# Patient Record
Sex: Male | Born: 1955
Health system: Southern US, Community
[De-identification: ages and names within clinical notes are randomized; demographics above are authoritative.]

## PROBLEM LIST (undated history)

## (undated) DIAGNOSIS — K219 Gastro-esophageal reflux disease without esophagitis: Secondary | ICD-10-CM

## (undated) DIAGNOSIS — B1920 Unspecified viral hepatitis C without hepatic coma: Secondary | ICD-10-CM

## (undated) DIAGNOSIS — F419 Anxiety disorder, unspecified: Secondary | ICD-10-CM

## (undated) DIAGNOSIS — Z87442 Personal history of urinary calculi: Secondary | ICD-10-CM

## (undated) DIAGNOSIS — N200 Calculus of kidney: Secondary | ICD-10-CM

## (undated) DIAGNOSIS — Z973 Presence of spectacles and contact lenses: Secondary | ICD-10-CM

## (undated) DIAGNOSIS — D569 Thalassemia, unspecified: Secondary | ICD-10-CM

## (undated) DIAGNOSIS — R51 Headache: Secondary | ICD-10-CM

## (undated) DIAGNOSIS — M4316 Spondylolisthesis, lumbar region: Secondary | ICD-10-CM

## (undated) DIAGNOSIS — M47812 Spondylosis without myelopathy or radiculopathy, cervical region: Secondary | ICD-10-CM

## (undated) DIAGNOSIS — B001 Herpesviral vesicular dermatitis: Secondary | ICD-10-CM

## (undated) DIAGNOSIS — I10 Essential (primary) hypertension: Secondary | ICD-10-CM

## (undated) DIAGNOSIS — F329 Major depressive disorder, single episode, unspecified: Secondary | ICD-10-CM

## (undated) DIAGNOSIS — F32A Depression, unspecified: Secondary | ICD-10-CM

## (undated) DIAGNOSIS — J189 Pneumonia, unspecified organism: Secondary | ICD-10-CM

## (undated) HISTORY — PX: COLONOSCOPY: SHX174

## (undated) HISTORY — DX: Calculus of kidney: N20.0

## (undated) HISTORY — DX: Essential (primary) hypertension: I10

## (undated) HISTORY — DX: Major depressive disorder, single episode, unspecified: F32.9

## (undated) HISTORY — DX: Unspecified viral hepatitis C without hepatic coma: B19.20

## (undated) HISTORY — DX: Thalassemia, unspecified: D56.9

## (undated) HISTORY — DX: Spondylosis without myelopathy or radiculopathy, cervical region: M47.812

## (undated) HISTORY — PX: TONSILLECTOMY: SUR1361

## (undated) HISTORY — DX: Headache: R51

## (undated) HISTORY — DX: Depression, unspecified: F32.A

## (undated) HISTORY — DX: Herpesviral vesicular dermatitis: B00.1

---

## 1998-03-14 ENCOUNTER — Encounter: Admission: RE | Admit: 1998-03-14 | Discharge: 1998-03-14 | Payer: Self-pay | Admitting: Internal Medicine

## 1998-03-29 ENCOUNTER — Encounter: Admission: RE | Admit: 1998-03-29 | Discharge: 1998-03-29 | Payer: Self-pay | Admitting: Internal Medicine

## 1998-03-30 ENCOUNTER — Encounter: Admission: RE | Admit: 1998-03-30 | Discharge: 1998-03-30 | Payer: Self-pay | Admitting: Internal Medicine

## 1998-04-04 ENCOUNTER — Encounter: Admission: RE | Admit: 1998-04-04 | Discharge: 1998-04-04 | Payer: Self-pay | Admitting: Internal Medicine

## 1998-04-16 DIAGNOSIS — N200 Calculus of kidney: Secondary | ICD-10-CM

## 1998-04-16 HISTORY — DX: Calculus of kidney: N20.0

## 1998-06-06 ENCOUNTER — Encounter: Admission: RE | Admit: 1998-06-06 | Discharge: 1998-06-06 | Payer: Self-pay | Admitting: Internal Medicine

## 1998-10-03 ENCOUNTER — Encounter: Admission: RE | Admit: 1998-10-03 | Discharge: 1998-10-03 | Payer: Self-pay | Admitting: Internal Medicine

## 1998-11-10 ENCOUNTER — Encounter: Payer: Self-pay | Admitting: Urology

## 1998-11-10 ENCOUNTER — Ambulatory Visit (HOSPITAL_COMMUNITY): Admission: RE | Admit: 1998-11-10 | Discharge: 1998-11-10 | Payer: Self-pay | Admitting: Urology

## 1999-02-01 ENCOUNTER — Encounter: Admission: RE | Admit: 1999-02-01 | Discharge: 1999-02-01 | Payer: Self-pay | Admitting: Internal Medicine

## 1999-05-03 ENCOUNTER — Encounter: Admission: RE | Admit: 1999-05-03 | Discharge: 1999-05-03 | Payer: Self-pay | Admitting: Internal Medicine

## 1999-07-13 ENCOUNTER — Ambulatory Visit (HOSPITAL_COMMUNITY): Admission: RE | Admit: 1999-07-13 | Discharge: 1999-07-13 | Payer: Self-pay

## 1999-07-13 ENCOUNTER — Encounter: Admission: RE | Admit: 1999-07-13 | Discharge: 1999-07-13 | Payer: Self-pay | Admitting: Internal Medicine

## 1999-07-19 ENCOUNTER — Encounter: Admission: RE | Admit: 1999-07-19 | Discharge: 1999-07-19 | Payer: Self-pay | Admitting: Internal Medicine

## 1999-09-20 ENCOUNTER — Encounter: Admission: RE | Admit: 1999-09-20 | Discharge: 1999-09-20 | Payer: Self-pay | Admitting: Internal Medicine

## 2000-01-16 ENCOUNTER — Encounter: Admission: RE | Admit: 2000-01-16 | Discharge: 2000-01-16 | Payer: Self-pay | Admitting: Internal Medicine

## 2000-05-15 ENCOUNTER — Encounter: Admission: RE | Admit: 2000-05-15 | Discharge: 2000-05-15 | Payer: Self-pay | Admitting: Internal Medicine

## 2000-11-06 ENCOUNTER — Encounter: Admission: RE | Admit: 2000-11-06 | Discharge: 2000-11-06 | Payer: Self-pay | Admitting: Internal Medicine

## 2001-06-25 ENCOUNTER — Encounter: Admission: RE | Admit: 2001-06-25 | Discharge: 2001-06-25 | Payer: Self-pay | Admitting: Internal Medicine

## 2002-01-27 ENCOUNTER — Encounter: Admission: RE | Admit: 2002-01-27 | Discharge: 2002-01-27 | Payer: Self-pay | Admitting: Internal Medicine

## 2002-01-29 ENCOUNTER — Ambulatory Visit (HOSPITAL_COMMUNITY): Admission: RE | Admit: 2002-01-29 | Discharge: 2002-01-29 | Payer: Self-pay | Admitting: *Deleted

## 2002-01-29 ENCOUNTER — Encounter: Payer: Self-pay | Admitting: *Deleted

## 2002-02-02 ENCOUNTER — Encounter: Admission: RE | Admit: 2002-02-02 | Discharge: 2002-02-02 | Payer: Self-pay | Admitting: Internal Medicine

## 2002-02-10 ENCOUNTER — Encounter: Admission: RE | Admit: 2002-02-10 | Discharge: 2002-02-10 | Payer: Self-pay | Admitting: Internal Medicine

## 2002-04-16 DIAGNOSIS — M47812 Spondylosis without myelopathy or radiculopathy, cervical region: Secondary | ICD-10-CM

## 2002-04-16 HISTORY — PX: CERVICAL DISCECTOMY: SHX98

## 2002-04-16 HISTORY — DX: Spondylosis without myelopathy or radiculopathy, cervical region: M47.812

## 2002-04-16 HISTORY — PX: LITHOTRIPSY: SUR834

## 2002-05-28 ENCOUNTER — Encounter: Payer: Self-pay | Admitting: Neurological Surgery

## 2002-06-01 ENCOUNTER — Encounter: Payer: Self-pay | Admitting: Neurological Surgery

## 2002-06-01 ENCOUNTER — Inpatient Hospital Stay (HOSPITAL_COMMUNITY): Admission: RE | Admit: 2002-06-01 | Discharge: 2002-06-02 | Payer: Self-pay | Admitting: Neurological Surgery

## 2002-08-03 ENCOUNTER — Encounter: Admission: RE | Admit: 2002-08-03 | Discharge: 2002-11-01 | Payer: Self-pay | Admitting: Neurological Surgery

## 2002-08-14 ENCOUNTER — Encounter: Admission: RE | Admit: 2002-08-14 | Discharge: 2002-08-14 | Payer: Self-pay | Admitting: Internal Medicine

## 2002-12-09 ENCOUNTER — Encounter: Admission: RE | Admit: 2002-12-09 | Discharge: 2002-12-09 | Payer: Self-pay | Admitting: Internal Medicine

## 2003-03-31 ENCOUNTER — Encounter: Admission: RE | Admit: 2003-03-31 | Discharge: 2003-03-31 | Payer: Self-pay | Admitting: Internal Medicine

## 2003-04-28 ENCOUNTER — Encounter: Admission: RE | Admit: 2003-04-28 | Discharge: 2003-04-28 | Payer: Self-pay | Admitting: Internal Medicine

## 2003-05-21 ENCOUNTER — Ambulatory Visit (HOSPITAL_COMMUNITY): Admission: RE | Admit: 2003-05-21 | Discharge: 2003-05-21 | Payer: Self-pay | Admitting: Gastroenterology

## 2003-05-21 LAB — HM COLONOSCOPY: HM Colonoscopy: NORMAL

## 2004-02-14 ENCOUNTER — Ambulatory Visit: Payer: Self-pay | Admitting: Internal Medicine

## 2004-09-19 ENCOUNTER — Ambulatory Visit: Payer: Self-pay | Admitting: Internal Medicine

## 2004-09-27 ENCOUNTER — Ambulatory Visit: Payer: Self-pay | Admitting: Internal Medicine

## 2004-10-03 ENCOUNTER — Ambulatory Visit (HOSPITAL_COMMUNITY): Admission: RE | Admit: 2004-10-03 | Discharge: 2004-10-03 | Payer: Self-pay | Admitting: Internal Medicine

## 2004-12-15 ENCOUNTER — Ambulatory Visit (HOSPITAL_COMMUNITY): Admission: RE | Admit: 2004-12-15 | Discharge: 2004-12-15 | Payer: Self-pay | Admitting: Gastroenterology

## 2004-12-15 ENCOUNTER — Encounter (INDEPENDENT_AMBULATORY_CARE_PROVIDER_SITE_OTHER): Payer: Self-pay | Admitting: Specialist

## 2004-12-25 ENCOUNTER — Ambulatory Visit: Payer: Self-pay | Admitting: Internal Medicine

## 2005-03-13 ENCOUNTER — Ambulatory Visit: Payer: Self-pay | Admitting: Internal Medicine

## 2005-04-16 HISTORY — PX: NASAL SEPTUM SURGERY: SHX37

## 2005-10-29 ENCOUNTER — Ambulatory Visit: Payer: Self-pay | Admitting: Internal Medicine

## 2005-12-24 ENCOUNTER — Ambulatory Visit: Payer: Self-pay | Admitting: Internal Medicine

## 2006-01-29 ENCOUNTER — Ambulatory Visit: Payer: Self-pay | Admitting: Internal Medicine

## 2006-02-22 DIAGNOSIS — N200 Calculus of kidney: Secondary | ICD-10-CM | POA: Insufficient documentation

## 2006-02-22 DIAGNOSIS — B182 Chronic viral hepatitis C: Secondary | ICD-10-CM | POA: Insufficient documentation

## 2006-02-22 DIAGNOSIS — I10 Essential (primary) hypertension: Secondary | ICD-10-CM | POA: Insufficient documentation

## 2006-04-04 DIAGNOSIS — B009 Herpesviral infection, unspecified: Secondary | ICD-10-CM | POA: Insufficient documentation

## 2006-04-04 HISTORY — DX: Herpesviral infection, unspecified: B00.9

## 2006-06-26 ENCOUNTER — Telehealth: Payer: Self-pay | Admitting: *Deleted

## 2006-09-25 ENCOUNTER — Ambulatory Visit: Payer: Self-pay | Admitting: Internal Medicine

## 2006-09-25 DIAGNOSIS — F3289 Other specified depressive episodes: Secondary | ICD-10-CM | POA: Insufficient documentation

## 2006-09-25 DIAGNOSIS — F329 Major depressive disorder, single episode, unspecified: Secondary | ICD-10-CM | POA: Insufficient documentation

## 2006-09-25 LAB — CONVERTED CEMR LAB
BUN: 13 mg/dL (ref 6–23)
CO2: 29 meq/L (ref 19–32)
Calcium: 9.6 mg/dL (ref 8.4–10.5)
Chloride: 100 meq/L (ref 96–112)
Cholesterol: 150 mg/dL (ref 0–200)
Creatinine, Ser: 1.13 mg/dL (ref 0.40–1.50)
Glucose, Bld: 95 mg/dL (ref 70–99)
HDL: 46 mg/dL (ref >39)
LDL Cholesterol: 84 mg/dL (ref 0–99)
Potassium: 4.4 meq/L (ref 3.5–5.3)
Sodium: 141 meq/L (ref 135–145)
Total CHOL/HDL Ratio: 3.3
Triglycerides: 99 mg/dL (ref <150)
VLDL: 20 mg/dL (ref 0–40)

## 2006-11-14 ENCOUNTER — Telehealth (INDEPENDENT_AMBULATORY_CARE_PROVIDER_SITE_OTHER): Payer: Self-pay | Admitting: *Deleted

## 2006-11-19 ENCOUNTER — Telehealth (INDEPENDENT_AMBULATORY_CARE_PROVIDER_SITE_OTHER): Payer: Self-pay | Admitting: *Deleted

## 2006-12-31 ENCOUNTER — Encounter: Payer: Self-pay | Admitting: Internal Medicine

## 2007-01-08 ENCOUNTER — Telehealth: Payer: Self-pay | Admitting: Internal Medicine

## 2007-03-25 ENCOUNTER — Telehealth: Payer: Self-pay | Admitting: Internal Medicine

## 2007-09-19 ENCOUNTER — Telehealth: Payer: Self-pay | Admitting: Internal Medicine

## 2007-10-27 ENCOUNTER — Telehealth: Payer: Self-pay | Admitting: Internal Medicine

## 2007-12-24 ENCOUNTER — Telehealth: Payer: Self-pay | Admitting: Internal Medicine

## 2008-01-09 ENCOUNTER — Ambulatory Visit: Payer: Self-pay | Admitting: Internal Medicine

## 2008-01-09 LAB — CONVERTED CEMR LAB
ALT: 46 units/L (ref 0–53)
AST: 36 units/L (ref 0–37)
Albumin: 4.4 g/dL (ref 3.5–5.2)
Alkaline Phosphatase: 60 units/L (ref 39–117)
BUN: 15 mg/dL (ref 6–23)
Basophils Absolute: 0.1 10*3/uL (ref 0.0–0.1)
Basophils Relative: 1 % (ref 0–1)
CO2: 30 meq/L (ref 19–32)
Calcium: 9.5 mg/dL (ref 8.4–10.5)
Chloride: 98 meq/L (ref 96–112)
Creatinine, Ser: 1.13 mg/dL (ref 0.40–1.50)
Eosinophils Absolute: 0.4 10*3/uL (ref 0.0–0.7)
Eosinophils Relative: 5 % (ref 0–5)
Glucose, Bld: 101 mg/dL — ABNORMAL HIGH (ref 70–99)
HCT: 43.5 % (ref 39.0–52.0)
Hemoglobin: 14.4 g/dL (ref 13.0–17.0)
Lymphocytes Relative: 44 % (ref 12–46)
Lymphs Abs: 3.3 10*3/uL (ref 0.7–4.0)
MCHC: 33.1 g/dL (ref 30.0–36.0)
MCV: 72.6 fL — ABNORMAL LOW (ref 78.0–100.0)
Monocytes Absolute: 0.7 10*3/uL (ref 0.1–1.0)
Monocytes Relative: 9 % (ref 3–12)
Neutro Abs: 3.1 10*3/uL (ref 1.7–7.7)
Neutrophils Relative %: 41 % — ABNORMAL LOW (ref 43–77)
Platelets: 191 10*3/uL (ref 150–400)
Potassium: 3.8 meq/L (ref 3.5–5.3)
RBC: 5.99 M/uL — ABNORMAL HIGH (ref 4.22–5.81)
RDW: 15.3 % (ref 11.5–15.5)
Sodium: 139 meq/L (ref 135–145)
Total Bilirubin: 0.5 mg/dL (ref 0.3–1.2)
Total Protein: 7.5 g/dL (ref 6.0–8.3)
WBC: 7.5 10*3/uL (ref 4.0–10.5)

## 2008-03-16 ENCOUNTER — Telehealth: Payer: Self-pay | Admitting: Internal Medicine

## 2008-07-13 ENCOUNTER — Telehealth: Payer: Self-pay | Admitting: Internal Medicine

## 2008-08-04 ENCOUNTER — Telehealth: Payer: Self-pay | Admitting: Internal Medicine

## 2008-12-01 ENCOUNTER — Telehealth: Payer: Self-pay | Admitting: Internal Medicine

## 2009-02-18 ENCOUNTER — Ambulatory Visit: Payer: Self-pay | Admitting: Internal Medicine

## 2009-02-18 LAB — CONVERTED CEMR LAB
ALT: 43 units/L (ref 0–53)
AST: 35 units/L (ref 0–37)
Albumin: 4.3 g/dL (ref 3.5–5.2)
Alkaline Phosphatase: 77 units/L (ref 39–117)
BUN: 15 mg/dL (ref 6–23)
CO2: 27 meq/L (ref 19–32)
Calcium: 9.6 mg/dL (ref 8.4–10.5)
Chloride: 102 meq/L (ref 96–112)
Cholesterol: 188 mg/dL (ref 0–200)
Creatinine, Ser: 1.11 mg/dL (ref 0.40–1.50)
Glucose, Bld: 121 mg/dL — ABNORMAL HIGH (ref 70–99)
HDL: 40 mg/dL (ref >39)
LDL Cholesterol: 124 mg/dL — ABNORMAL HIGH (ref 0–99)
Potassium: 3.6 meq/L (ref 3.5–5.3)
Sodium: 142 meq/L (ref 135–145)
Total Bilirubin: 0.5 mg/dL (ref 0.3–1.2)
Total CHOL/HDL Ratio: 4.7
Total Protein: 7.2 g/dL (ref 6.0–8.3)
Triglycerides: 119 mg/dL (ref <150)
VLDL: 24 mg/dL (ref 0–40)

## 2009-02-24 ENCOUNTER — Telehealth: Payer: Self-pay | Admitting: Internal Medicine

## 2009-03-17 ENCOUNTER — Telehealth: Payer: Self-pay | Admitting: Internal Medicine

## 2009-03-31 ENCOUNTER — Telehealth: Payer: Self-pay | Admitting: Internal Medicine

## 2009-04-06 ENCOUNTER — Telehealth: Payer: Self-pay | Admitting: Internal Medicine

## 2009-05-06 ENCOUNTER — Ambulatory Visit: Payer: Self-pay | Admitting: Internal Medicine

## 2009-05-06 DIAGNOSIS — R519 Headache, unspecified: Secondary | ICD-10-CM | POA: Insufficient documentation

## 2009-05-06 DIAGNOSIS — R51 Headache: Secondary | ICD-10-CM | POA: Insufficient documentation

## 2009-09-01 ENCOUNTER — Telehealth: Payer: Self-pay | Admitting: Internal Medicine

## 2009-09-05 ENCOUNTER — Telehealth: Payer: Self-pay | Admitting: Internal Medicine

## 2009-09-20 ENCOUNTER — Ambulatory Visit: Payer: Self-pay | Admitting: Internal Medicine

## 2009-09-20 DIAGNOSIS — M79609 Pain in unspecified limb: Secondary | ICD-10-CM | POA: Insufficient documentation

## 2009-09-20 LAB — CONVERTED CEMR LAB
BUN: 14 mg/dL (ref 6–23)
CO2: 30 meq/L (ref 19–32)
Calcium: 9.6 mg/dL (ref 8.4–10.5)
Chloride: 100 meq/L (ref 96–112)
Creatinine, Ser: 1.19 mg/dL (ref 0.40–1.50)
Glucose, Bld: 103 mg/dL — ABNORMAL HIGH (ref 70–99)
Potassium: 3.8 meq/L (ref 3.5–5.3)
Sodium: 139 meq/L (ref 135–145)

## 2009-11-03 ENCOUNTER — Ambulatory Visit: Payer: Self-pay | Admitting: Internal Medicine

## 2009-11-03 ENCOUNTER — Telehealth: Payer: Self-pay | Admitting: Internal Medicine

## 2009-11-03 DIAGNOSIS — M542 Cervicalgia: Secondary | ICD-10-CM | POA: Insufficient documentation

## 2009-12-07 ENCOUNTER — Ambulatory Visit: Payer: Self-pay | Admitting: Internal Medicine

## 2010-03-27 ENCOUNTER — Telehealth: Payer: Self-pay | Admitting: Internal Medicine

## 2010-05-16 NOTE — Assessment & Plan Note (Signed)
Summary: est-ck/fu/med/flu shot/cfb     Vital Signs:  Patient profile:   55 year old male Height:      69 inches (175.26 cm) Weight:      217.6 pounds (98.91 kg) BMI:     32.25 Temp:     98.6 degrees F oral Pulse rate:   99 / minute BP sitting:   142 / 92  (right arm) Cuff size:   large  Vitals Entered By: Krystal Eaton Duncan Dull) (February 18, 2009 10:51 AM) CC: routine f/u, flu vaccine, med refill Is Patient Diabetic? No Pain Assessment Patient in pain? no      Nutritional Status BMI of > 30 = obese  Have you ever been in a relationship where you felt threatened, hurt or afraid?No   Does patient need assistance? Functional Status Self care Ambulation Normal   CC:  routine f/u, flu vaccine, and med refill.  History of Present Illness: 55 man with mild HTN.  Routine f/u No complaints.  Preventive Screening-Counseling & Management  Alcohol-Tobacco     Smoking Status: quit     Year Quit: 1980's  Allergies: No Known Drug Allergies   Impression & Recommendations:  Problem # 1:  DISORDER, DEPRESSIVE NEC (ICD-311) sees psychiatrist.  doing well.   His updated medication list for this problem includes:    Diazepam 5 Mg Tabs (Diazepam) .Marland Kitchen... Take 1 tablet by mouth two times a day  Problem # 2:  HYPERTENSION (ICD-401.9)  BP good today despite rushing and wgt up. no CV symptoms. Cor reg w/o murmur.  Lungs clear.   His updated medication list for this problem includes:    Hydrochlorothiazide 25 Mg Tabs (Hydrochlorothiazide) ..... Once daily    Atenolol 50 Mg Tabs (Atenolol) ..... Once daily    Amlodipine Besylate 10 Mg Tabs (Amlodipine besylate) .Marland Kitchen... Take 1 tablet by mouth once a day    Lotensin 40 Mg Tabs (Benazepril hcl) .Marland Kitchen... 2 tabs once daily  Orders: T-Lipid Profile ([id number]) T-Comprehensive Metabolic Panel ([id number])  Problem # 3:  HEPATITIS C (ICD-070.51) sees dr. Randa Evens yearly.  Problem # 4:  CALCULUS, KIDNEY (ICD-592.0) sees dr.  Isabel Caprice, urologist.  Complete Medication List: 1)  Hydrochlorothiazide 25 Mg Tabs (Hydrochlorothiazide) .... Once daily 2)  Darvocet-n 100 Tabs (Propoxyphene n-apap tabs) .... Take three times a day as needed for pain 3)  Flomax 0.4 Mg Cp24 (Tamsulosin hcl) .... At bedtime (dr. Isabel Caprice) 4)  Atenolol 50 Mg Tabs (Atenolol) .... Once daily 5)  Amlodipine Besylate 10 Mg Tabs (Amlodipine besylate) .... Take 1 tablet by mouth once a day 6)  Diazepam 5 Mg Tabs (Diazepam) .... Take 1 tablet by mouth two times a day 7)  Lotensin 40 Mg Tabs (Benazepril hcl) .... 2 tabs once daily 8)  Symbyax 6-50 Mg Caps (Olanzapine-fluoxetine hcl) .... Take 1 at bedtime  Patient Instructions: 1)  Please schedule a follow-up appointment in 6 months. Process Orders Check Orders Results:     [facility]: ABN not required for this insurance Tests Sent for requisitioning (February 18, 2009 12:36 PM):     02/18/2009: [facility] -- T-Lipid Profile [phone] (signed)     02/18/2009: [facility] -- T-Comprehensive Metabolic Panel [id number] (signed)

## 2010-05-16 NOTE — Progress Notes (Signed)
Summary: refill/gg  Phone Note Refill Request  on Sep 01, 2009 4:37 PM  Refills Requested: Medication #1:  DIAZEPAM 5 MG TABS Take 1 tablet by mouth two times a day   Last Refilled: 08/01/2009  Medication #2:  HYDROCODONE-ACETAMINOPHEN 5-500 MG TABS May take up to 3 a day for pain.   Last Refilled: 08/01/2009  Method Requested: Fax to Local Pharmacy Initial call taken by: Merrie Roof RN,  Sep 01, 2009 4:37 PM  Follow-up for Phone Call        Refill approved-nurse to complete Follow-up by: Ulyess Mort MD,  Sep 02, 2009 4:48 PM  Additional Follow-up for Phone Call Additional follow up Details #1::        Rx faxed to pharmacy Additional Follow-up by: Merrie Roof RN,  Sep 05, 2009 1:44 PM    Prescriptions: HYDROCODONE-ACETAMINOPHEN 5-500 MG TABS (HYDROCODONE-ACETAMINOPHEN) May take up to 3 a day for pain  #90 x 3   Entered and Authorized by:   Ulyess Mort MD   Signed by:   Ulyess Mort MD on 09/02/2009   Method used:   Telephoned to ...       [facility] [address] [phone]* (retail)       [address]       [place], [place]  [address]       Ph: [phone] or [phone]       Fax: [phone]   RxID:   [id number] DIAZEPAM 5 MG TABS (DIAZEPAM) Take 1 tablet by mouth two times a day  #62 x 6   Entered and Authorized by:   Ulyess Mort MD   Signed by:   Ulyess Mort MD on 09/02/2009   Method used:   Telephoned to ...       [facility] [address] [phone]* (retail)       [address]       [place], [place]  [address]       Ph: [phone] or [phone]       Fax: [phone]   RxID:   [id number]

## 2010-05-16 NOTE — Assessment & Plan Note (Signed)
Summary: est-ck/fu/meds/cfb   Vital Signs:  Patient profile:   55 year old male Height:      69 inches (175.26 cm) Weight:      209.7 pounds (95.32 kg) BMI:     31.08 Temp:     97.2 degrees F (36.22 degrees C) oral Pulse rate:   78 / minute BP sitting:   125 / 89  (right arm)  Vitals Entered By: Cynda Familia Duncan Dull) (September 20, 2009 2:34 PM) CC: pt c/o pain bottom of left foot/heel, Depression Is Patient Diabetic? No Pain Assessment Patient in pain? yes     Location: left foot Intensity: 8 Type: sharp Onset of pain  Constant 1.5 mths Nutritional Status BMI of 25 - 29 = overweight  Have you ever been in a relationship where you felt threatened, hurt or afraid?No   Does patient need assistance? Functional Status Self care Ambulation Normal   CC:  pt c/o pain bottom of left foot/heel and Depression.  History of Present Illness: 55 man with HTN.   Today he c/o foot pain that he blames on walking on hard floor at work.  Has started seeing a chiropractor about this and is to have frequent visits.  Asked about increased vicodin.  Depression History:      The patient denies a depressed mood most of the day and a diminished interest in his usual daily activities.        Comments:  "gets depressed mood about every 3 days".   Preventive Screening-Counseling & Management  Alcohol-Tobacco     Smoking Status: quit     Year Quit: 1980's  Allergies: No Known Drug Allergies  Physical Exam  Lungs:  normal breath sounds, no crackles, and no wheezes.   Heart:  regular rhythm, no murmur, and no HJR.   Extremities:  May have flat feet.  No skin lesions.  Pulses nl.   Impression & Recommendations:  Problem # 1:  FOOT PAIN, BILATERAL (ICD-729.5) Do not know podiatric name for his problem.  Do not see indication for opiate therapy.  He is already on vicodin 5 three times a day for headaches -- This was a substitute when darvocet pulled from use. Since he is already seeing a  chiropractor and has purchased new shoes, will advise wait and see.  He is to consider visit to podiatrist if problem persists.  Complete Medication List: 1)  Hydrochlorothiazide 25 Mg Tabs (Hydrochlorothiazide) .... Once daily 2)  Flomax 0.4 Mg Cp24 (Tamsulosin hcl) .... At bedtime (dr. Isabel Caprice) 3)  Atenolol 50 Mg Tabs (Atenolol) .... Once daily 4)  Amlodipine Besylate 10 Mg Tabs (Amlodipine besylate) .... Take 1 tablet by mouth once a day 5)  Diazepam 5 Mg Tabs (Diazepam) .... Take 1 tablet by mouth two times a day 6)  Lotensin 40 Mg Tabs (Benazepril hcl) .... 2 tabs once daily 7)  Symbyax 6-50 Mg Caps (Olanzapine-fluoxetine hcl) .... Take 1 at bedtime 8)  Tramadol-acetaminophen 37.5-325 Mg Tabs (Tramadol-acetaminophen) .... Take one every 6 hours as needed for pain 9)  Hydrocodone-acetaminophen 5-500 Mg Tabs (Hydrocodone-acetaminophen) .... May take up to 3 a day for pain  Other Orders: T-Basic Metabolic Panel [phone])  Patient Instructions: 1)  Please schedule a follow-up appointment in 4 months. Process Orders Check Orders Results:     [facility]: ABN not required for this insurance Tests Sent for requisitioning (September 20, 2009 4:58 PM):     09/20/2009: [facility] -- T-Basic Metabolic Panel [phone] (  signed)    Prevention & Chronic Care Immunizations   Influenza vaccine: Fluvax 3+  (01/09/2008)    Tetanus booster: Not documented    Pneumococcal vaccine: Not documented  Colorectal Screening   Hemoccult: Not documented    Colonoscopy: Not documented  Other Screening   PSA: Not documented   Smoking status: quit  (09/20/2009)  Lipids   Total Cholesterol: 188  (02/18/2009)   LDL: 124  (02/18/2009)   LDL Direct: Not documented   HDL: 40  (02/18/2009)   Triglycerides: 119  (02/18/2009)  Hypertension   Last Blood Pressure: 125 / 89  (09/20/2009)   Serum creatinine: 1.11  (02/18/2009)   Serum potassium 3.6   (02/18/2009)  Self-Management Support :    Patient will work on the following items until the next clinic visit to reach self-care goals:     Medications and monitoring: take my medicines every day  (09/20/2009)     Eating: eat foods that are low in salt, eat baked foods instead of fried foods  (09/20/2009)    Hypertension self-management support: Pre-printed educational material, Resources for patients handout, Written self-care plan  (09/20/2009)   Hypertension self-care plan printed.      Resource handout printed.

## 2010-05-16 NOTE — Progress Notes (Signed)
Summary: Refill/gh  Phone Note Refill Request Message from:  Pharmacy on March 16, 2008 3:04 PM  Refills Requested: Medication #1:  DARVOCET-N 100  TABS Take three times a day as needed for pain   Last Refilled: 03/02/2008  Method Requested: Electronic Initial call taken by: Angelina Ok RN,  March 16, 2008 3:04 PM  Follow-up for Phone Call        Refill approved-nurse to complete Follow-up by: Ulyess Mort MD,  March 16, 2008 5:07 PM  Additional Follow-up for Phone Call Additional follow up Details #1::        Rx called to pharmacy Additional Follow-up by: Angelina Ok RN,  March 17, 2008 9:04 AM      Prescriptions: DARVOCET-N 100  TABS (PROPOXYPHENE N-APAP TABS) Take three times a day as needed for pain  #90 x 4   Entered and Authorized by:   Ulyess Mort MD   Signed by:   Ulyess Mort MD on 03/16/2008   Method used:   Telephoned to ...       [facility] [address] [phone]* (retail)       [address]       [place], [place]  [address]       Ph: [phone] or [phone]       Fax: [phone]   RxID:   [id number]

## 2010-05-16 NOTE — Progress Notes (Signed)
Summary: Refill/gh  Phone Note Refill Request Message from:  Fax from Pharmacy on August 04, 2008 8:54 AM  Refills Requested: Medication #1:  DIAZEPAM 5 MG TABS Take 1 tablet by mouth two times a day   Last Refilled: 05/26/2008  Method Requested: Fax to Local Pharmacy Initial call taken by: Angelina Ok RN,  August 04, 2008 11:01 AM      Prescriptions: DIAZEPAM 5 MG TABS (DIAZEPAM) Take 1 tablet by mouth two times a day  #62 x 6   Entered and Authorized by:   Ulyess Mort MD   Signed by:   Ulyess Mort MD on 08/04/2008   Method used:   Telephoned to ...       [facility] [address] [phone]* (retail)       [address]       [place], [place]  [address]       Ph: [phone] or [phone]       Fax: [phone]   RxID:   [id number]
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
## 2010-05-16 NOTE — Progress Notes (Signed)
Summary: Rx refill/dms  Phone Note Refill Request Message from:  Fax from Pharmacy on November 14, 2006 8:43 AM  Refills Requested: Medication #1:  DIAZEPAM 5 MG TABS Take 1 tablet by mouth two times a day   Supply Requested: 40   Last Refilled: 06/23/2006   Notes: bid prn  Method Requested: Electronic Initial call taken by: Henderson Cloud,  November 14, 2006 8:44 AM  Follow-up for Phone Call        Refill approved-nurse to complete Follow-up by: Ulyess Mort MD,  November 14, 2006 4:20 PM  Additional Follow-up for Phone Call Additional follow up Details #1::        Rx faxed to pharmacy Additional Follow-up by: Henderson Cloud,  November 15, 2006 12:29 PM      Prescriptions: DIAZEPAM 5 MG TABS (DIAZEPAM) Take 1 tablet by mouth two times a day  #62 x 4   Entered and Authorized by:   Ulyess Mort MD   Signed by:   Ulyess Mort MD on 11/14/2006   Method used:   Electronically sent to ...       [facility]       [address]       [place], [place]  [address]       Ph: [phone]       Fax: [phone]   RxID:   [id number]

## 2010-05-16 NOTE — Procedures (Signed)
[facility] : Dr. Randa Evens   Imported By: Florinda Marker 01/23/2007 11:03:04  _____________________________________________________________________  External Attachment:    Type:   Image     Comment:   External Document

## 2010-05-16 NOTE — Progress Notes (Signed)
Summary: refillgg  Phone Note Refill Request  on January 08, 2007 2:27 PM  Refills Requested: Medication #1:  DARVOCET-N 100  TABS   Last Refilled: 12/06/2006 Initial call taken by: Merrie Roof RN,  January 08, 2007 2:29 PM  Follow-up for Phone Call        Refill approved-nurse to complete Follow-up by: Ulyess Mort MD,  January 08, 2007 2:31 PM  Additional Follow-up for Phone Call Additional follow up Details #1::        Rx faxed to pharmacy Additional Follow-up by: Merrie Roof RN,  January 08, 2007 3:43 PM    New/Updated Medications: DARVOCET-N 100  TABS (PROPOXYPHENE N-APAP TABS) Take three times a day as needed for pain   Prescriptions: DARVOCET-N 100  TABS (PROPOXYPHENE N-APAP TABS) Take three times a day as needed for pain  #90 x 2   Entered and Authorized by:   Ulyess Mort MD   Signed by:   Ulyess Mort MD on 01/08/2007   Method used:   Telephoned to ...       [facility] [phone] [facility] [address]       [address]       [place], [place]  [address]       Ph: [phone] or [phone]       Fax: [phone]   RxID:   [id number]

## 2010-05-16 NOTE — Progress Notes (Signed)
Summary: refill/gg  Phone Note Refill Request  on March 25, 2007 3:01 PM  Refills Requested: Medication #1:  DARVOCET-N 100  TABS Take three times a day as needed for pain   Last Refilled: 02/26/2007 Initial call taken by: Merrie Roof RN,  March 25, 2007 3:05 PM  Follow-up for Phone Call        Refill approved-nurse to complete Follow-up by: Ulyess Mort MD,  March 25, 2007 3:59 PM  Additional Follow-up for Phone Call Additional follow up Details #1::        Rx faxed to pharmacy Additional Follow-up by: Merrie Roof RN,  March 26, 2007 3:21 PM      Prescriptions: DARVOCET-N 100  TABS (PROPOXYPHENE N-APAP TABS) Take three times a day as needed for pain  #90 x 4   Entered and Authorized by:   Ulyess Mort MD   Signed by:   Ulyess Mort MD on 03/25/2007   Method used:   Telephoned to ...       [facility] [address] [phone]*       [address]       [place], [place]  [address]       Ph: [phone] or [phone]       Fax: [phone]   RxID:   [id number]
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
## 2010-05-16 NOTE — Progress Notes (Signed)
Summary: Refill/gh  Phone Note Refill Request Message from:  Fax from Pharmacy on Sep 05, 2009 10:09 AM  Refills Requested: Medication #1:  HYDROCODONE-ACETAMINOPHEN 5-500 MG TABS May take up to 3 a day for pain.   Last Refilled: 08/01/2009  Medication #2:  DIAZEPAM 5 MG TABS Take 1 tablet by mouth two times a day   Last Refilled: 08/01/2009  Method Requested: Electronic Initial call taken by: Angelina Ok RN,  Sep 05, 2009 10:09 AM  Follow-up for Phone Call        Done last week? Follow-up by: Ulyess Mort MD,  Sep 05, 2009 4:51 PM  Additional Follow-up for Phone Call Additional follow up Details #1::        Call to pharmacy they did get the refills.  Additional Follow-up by: Angelina Ok RN,  Sep 06, 2009 9:18 AM

## 2010-05-16 NOTE — Progress Notes (Signed)
Summary: Refill/gh  Phone Note Refill Request Message from:  Fax from Pharmacy on February 24, 2009 10:37 AM  Refills Requested: Medication #1:  DIAZEPAM 5 MG TABS Take 1 tablet by mouth two times a day   Last Refilled: 01/29/2009  Method Requested: Electronic Initial call taken by: Angelina Ok RN,  February 24, 2009 10:37 AM  Follow-up for Phone Call        Refill approved-nurse to complete Follow-up by: Ulyess Mort MD,  February 24, 2009 10:55 AM  Additional Follow-up for Phone Call Additional follow up Details #1::        Rx faxed to pharmacy Additional Follow-up by: Angelina Ok RN,  February 24, 2009 10:58 AM    Prescriptions: DIAZEPAM 5 MG TABS (DIAZEPAM) Take 1 tablet by mouth two times a day  #62 x 6   Entered and Authorized by:   Ulyess Mort MD   Signed by:   Ulyess Mort MD on 02/24/2009   Method used:   Telephoned to ...       [facility] [address] [phone]* (retail)       [address]       [place], [place]  [address]       Ph: [phone] or [phone]       Fax: [phone]   RxID:   [id number]

## 2010-05-16 NOTE — Progress Notes (Signed)
Summary: change darvocet/ hla  Phone Note Refill Request Message from:  Patient on March 17, 2009 2:17 PM  Refills Requested: Medication #1:  DARVOCET-N 100  TABS Take three times a day as needed for pain PLEASE CHANGE THIS TO ANOTHER PAIN MED SINCE DARVOCET HAS BEEN TAKEN OFF MARKET, PT REQUEST A NEW PAIN MED FOR H/A  Initial call taken by: Marin Roberts RN,  March 17, 2009 2:18 PM    New/Updated Medications: TRAMADOL-ACETAMINOPHEN 37.5-325 MG TABS (TRAMADOL-ACETAMINOPHEN) Take one every 6 hours as needed for pain Prescriptions: TRAMADOL-ACETAMINOPHEN 37.5-325 MG TABS (TRAMADOL-ACETAMINOPHEN) Take one every 6 hours as needed for pain  #90 x 6   Entered and Authorized by:   Ulyess Mort MD   Signed by:   Ulyess Mort MD on 03/18/2009   Method used:   Electronically to        [facility] [address] [phone]* (retail)       [address]       [place], [place]  [address]       Ph: [phone] or [phone]       Fax: [phone]   RxID:   [id number]

## 2010-05-16 NOTE — Progress Notes (Signed)
Summary: refill/gg  Phone Note Refill Request  on July 13, 2008 11:09 AM  Refills Requested: Medication #1:  DARVOCET-N 100  TABS Take three times a day as needed for pain   Last Refilled: 06/12/2008  Method Requested: Fax to Local Pharmacy Initial call taken by: Merrie Roof RN,  July 13, 2008 11:10 AM  Additional Follow-up for Phone Call Additional follow up Details #1::        Rx faxed to pharmacy Additional Follow-up by: Merrie Roof RN,  July 14, 2008 4:28 PM      Prescriptions: DARVOCET-N 100  TABS (PROPOXYPHENE N-APAP TABS) Take three times a day as needed for pain  #90 x 4   Entered and Authorized by:   Ulyess Mort MD   Signed by:   Ulyess Mort MD on 07/14/2008   Method used:   Telephoned to ...       [facility] [address] [phone]* (retail)       [address]       [place], [place]  [address]       Ph: [phone] or [phone]       Fax: [phone]   RxID:   [id number]

## 2010-05-16 NOTE — Assessment & Plan Note (Signed)
Summary: EST-CK/FU/MEDS/CFB   Vital Signs:  Patient Profile:   55 Years Old Male Weight:      194.5 pounds (88.41 kg) Temp:     97.7 degrees F (36.50 degrees C) oral Pulse rate:   68 / minute BP sitting:   156 / 97  (right arm)  Pt. in pain?   yes    Location:   headache    Intensity:   4  Vitals Entered By: Krystal Eaton Duncan Dull) (September 25, 2006 1:29 PM)              Is Patient Diabetic? No  Have you ever been in a relationship where you felt threatened, hurt or afraid?No   Does patient need assistance? Functional Status Self care Ambulation Normal   Chief Complaint:  med refill and c/o frequent headaches.  History of Present Illness: 51 man with chronic problems.  Only new issue is increased job stress resulting in clinical depression.  He is now a patient of Dr. Jennelle Human on zyprexa.  He is perhaps some better.    Current Allergies (reviewed today): No known allergies    Social History:    Works at post office.  Married.  Exercises   Risk Factors:  Tobacco use:  quit    Year quit:  1980's Alcohol use:  no Exercise:  yes    Physical Exam  General:     alert and well-developed.   Head:     no abnormalities observed.   Eyes:     conjunctivae normally injected.  Anicteric Neck:     supple, no masses, and no thyromegaly.   Lungs:     normal respiratory effort, no crackles, and no wheezes.   Heart:     normal rate, regular rhythm, no murmur, and no JVD.   Abdomen:     soft, non-tender, no masses, no hepatomegaly, and no splenomegaly.   Rectal:     He is a regular patient of a urologist Extremities:     no edema or arthritis Neurologic:     alert & oriented X3, cranial nerves II-XII intact, strength normal in all extremities, sensation intact to light touch, and finger-to-nose normal.      Impression & Recommendations:  Problem # 1:  HYPERTENSION (ICD-401.9) 156/97.  He takes all these meds without fail. Has no CV sx including chest pain, SOB  or edema. Plan stepwise change beginning with amlodipine 5 to 10 once daily.    His updated medication list for this problem includes:    Hydrochlorothiazide 25 Mg Tabs (Hydrochlorothiazide) ..... Once daily    Lotensin Tabs (Benazepril hcl tabs) ..... 80mg  once daily    Atenolol 50 Mg Tabs (Atenolol) ..... Once daily    Amlodipine Besylate 10 Mg Tabs (Amlodipine besylate) .Marland Kitchen... Take 1 tablet by mouth once a day  Orders: T-Basic Metabolic Panel [id number]) T-Lipid Profile [id number])   Problem # 2:  HEPATITIS C (ICD-070.51) Follows annually with Dr. Randa Evens.  Relapsed after 1 year of interferon several yrs ago.  Says his basic liver labs have been normal and no further Rx is planned at the present.  Problem # 3:  Preventive Health Care (ICD-V70.0) Had colonoscopy 2 years ago -- diverticulosis is all ghe remembers.  No family hx and no GI sx. Urologist follows PSA and does rectal exam. Fasting lipids today. Non-smoker.  Medications Added to Medication List This Visit: 1)  Amlodipine Besylate 10 Mg Tabs (Amlodipine besylate) .... Take 1 tablet by mouth once a day  2)  Zyprexa 5 Mg Tabs (Olanzapine) .... Take 1 tablet by mouth once a day   Patient Instructions: 1)  Please schedule a follow-up appointment in 4 months. Prescriptions: AMLODIPINE BESYLATE 10 MG TABS (AMLODIPINE BESYLATE) Take 1 tablet by mouth once a day  #31 x 11   Entered and Authorized by:   Ulyess Mort MD   Signed by:   Ulyess Mort MD on 09/25/2006   Method used:   Printed then faxed to ...       [facility] [address]       [address]       [place], [place]  [address]       Ph: [phone] or [phone]       Fax: [phone]   RxID:   [id number]
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
## 2010-05-16 NOTE — Progress Notes (Signed)
Summary: Headache pain med  Phone Note Call from Patient   Caller: Patient Call For: Ulyess Mort MD Summary of Call: Call from pt said that he wants some thing for headaches until his appointment in January.  Pt was advised of appointment.  Headaches are at a level of 7.  [facility] Initial call taken by: Angelina Ok RN,  April 06, 2009 2:02 PM  Follow-up for Phone Call        Prescription for Tylenol-Codiene # 3  Follow-up by: Angelina Ok RN,  April 06, 2009 3:43 PM    New/Updated Medications: ACETAMINOPHEN-CODEINE #3 300-30 MG TABS (ACETAMINOPHEN-CODEINE) Take 1 every 6 hours as needed for pain Prescriptions: ACETAMINOPHEN-CODEINE #3 300-30 MG TABS (ACETAMINOPHEN-CODEINE) Take 1 every 6 hours as needed for pain  #90 x 1   Entered and Authorized by:   Ulyess Mort MD   Signed by:   Ulyess Mort MD on 04/06/2009   Method used:   Telephoned to ...       [facility] [address] [phone]* (retail)       [address]       [place], [place]  [address]       Ph: [phone] or [phone]       Fax: [phone]   RxID:   [id number]

## 2010-05-16 NOTE — Progress Notes (Signed)
Summary: Refill/gh  Phone Note Refill Request Message from:  Pharmacy on September 19, 2007 2:32 PM  Refills Requested: Medication #1:  DARVOCET-N 100  TABS Take three times a day as needed for pain   Last Refilled: 08/24/2007 Initial call taken by: Angelina Ok RN,  September 19, 2007 2:33 PM  Follow-up for Phone Call        Refill approved-nurse to complete Follow-up by: Ulyess Mort MD,  September 19, 2007 2:48 PM  Additional Follow-up for Phone Call Additional follow up Details #1::        Rx faxed to pharmacy Additional Follow-up by: Angelina Ok RN,  September 19, 2007 5:25 PM      Prescriptions: DARVOCET-N 100  TABS (PROPOXYPHENE N-APAP TABS) Take three times a day as needed for pain  #90 x 4   Entered and Authorized by:   Ulyess Mort MD   Signed by:   Ulyess Mort MD on 09/19/2007   Method used:   Telephoned to ...       [facility] [address] [phone]*       [address]       [place], [place]  [address]       Ph: [phone] or [phone]       Fax: [phone]   RxID:   [id number]

## 2010-05-16 NOTE — Progress Notes (Signed)
Summary: med refill/wl  Phone Note Refill Request Message from:  Fax from Pharmacy on November 19, 2006 10:51 AM  Refills Requested: Medication #1:  LOTENSIN  TABS 80mg  once daily   Last Refilled: 10/15/2006   Notes: Fax received is for 40mg , take 2 tablets every day, #60  Medication #2:  ATENOLOL 50 MG TABS once daily   Dosage confirmed as above?Dosage Confirmed   Last Refilled: 10/15/2006  Method Requested: electronic Initial call taken by: Dorene Sorrow RN,  November 19, 2006 10:53 AM  Follow-up for Phone Call        Refill approved-nurse to complete Follow-up by: Ulyess Mort MD,  November 19, 2006 3:02 PM  Additional Follow-up for Phone Call Additional follow up Details #1::        Rx faxed to pharmacy Additional Follow-up by: Dorene Sorrow RN,  November 19, 2006 3:15 PM      Prescriptions: ATENOLOL 50 MG TABS (ATENOLOL) once daily  #31 x 4   Entered and Authorized by:   Ulyess Mort MD   Signed by:   Ulyess Mort MD on 11/19/2006   Method used:   Electronically sent to ...       [facility] [phone] [facility] [address]       [address]       [place], [place]  [address]       Ph: [phone] or [phone]       Fax: [phone]   RxID:   [id number] LOTENSIN  TABS (BENAZEPRIL HCL TABS) 80mg  once daily  #31 x 4   Entered and Authorized by:   Ulyess Mort MD   Signed by:   Ulyess Mort MD on 11/19/2006   Method used:   Electronically sent to ...       [facility] [phone] [facility] [address]       [address]       [place], [place]  [address]       Ph: [phone] or [phone]       Fax: [phone]   RxID:   [id number]

## 2010-05-16 NOTE — Progress Notes (Signed)
Summary: med refill/gp  Phone Note Refill Request Message from:  Fax from Pharmacy on March 31, 2009 10:59 AM  Refills Requested: Medication #1:  LOTENSIN 40 MG TABS 2 tabs once daily   Last Refilled: 02/28/2009  Method Requested: Electronic Initial call taken by: Chinita Pester RN,  March 31, 2009 10:59 AM    Prescriptions: LOTENSIN 40 MG TABS (BENAZEPRIL HCL) 2 tabs once daily  #60 Tablet x 5   Entered and Authorized by:   Ulyess Mort MD   Signed by:   Ulyess Mort MD on 03/31/2009   Method used:   Electronically to        [facility]  [facility] [address] [phone]* (retail)       [address]       [place], [place]  [address]       Ph: [phone] or [phone]       Fax: [phone]   RxID:   [id number]

## 2010-05-16 NOTE — Progress Notes (Signed)
Summary: Refill/gh  Phone Note Refill Request Message from:  Pharmacy on October 27, 2007 11:08 AM  Refills Requested: Medication #1:  DIAZEPAM 5 MG TABS Take 1 tablet by mouth two times a day   Last Refilled: 05/07/2007 Initial call taken by: Angelina Ok RN,  October 27, 2007 11:08 AM  Follow-up for Phone Call        Refill approved-nurse to complete.  Please schedule an appointment with patient's primary MD.  Follow-up by: Margarito Liner MD,  October 28, 2007 12:32 PM  Additional Follow-up for Phone Call Additional follow up Details #1::        Rx faxed to pharmacy. Pt to be scheduled with an appointment with his PCP. Additional Follow-up by: Angelina Ok RN,  October 28, 2007 2:11 PM      Prescriptions: DIAZEPAM 5 MG TABS (DIAZEPAM) Take 1 tablet by mouth two times a day  #62 x 0   Entered and Authorized by:   Margarito Liner MD   Signed by:   Margarito Liner MD on 10/28/2007   Method used:   Telephoned to ...       [facility]  [facility] [address] [phone]*       [address]       [place], [place]  [address]       Ph: [phone] or [phone]       Fax: [phone]   RxID:   [id number]

## 2010-05-16 NOTE — Progress Notes (Signed)
Summary: Medication change  Phone Note Refill Request Message from:  Patient on March 31, 2009 10:30 AM  Refills Requested: Medication #1:  TRAMADOL-ACETAMINOPHEN 37.5-325 MG TABS Take one every 6 hours as needed for pain. Call from pt says that the Tramadol is not working for his headaches and wants something stronger.  Pt says that the Darvocet made him sleepy and the Tramadol doesnot work for him.  [facility] [address]    Angelina Ok RN  March 31, 2009 10:31 AM    Method Requested: Electronic Initial call taken by: Angelina Ok RN,  March 31, 2009 10:32 AM  Follow-up for Phone Call        Make him an appointment to evaluate his headaches. Follow-up by: Ulyess Mort MD,  March 31, 2009 11:09 AM  Additional Follow-up for Phone Call Additional follow up Details #1::        Flag to [facility] to schedule pt for an appointment Additional Follow-up by: Angelina Ok RN,  April 05, 2009 5:12 PM

## 2010-05-16 NOTE — Assessment & Plan Note (Signed)
Summary: EST-CK/FU/MEDS/CFB   Vital Signs:  Patient Profile:   55 Years Old Male Height:     69 inches (175.26 cm) Weight:      208.4 pounds (94.73 kg) BMI:     30.89 Temp:     97.0 degrees F (36.11 degrees C) oral Pulse rate:   76 / minute BP sitting:   158 / 104  (right arm)  Pt. in pain?   no  Vitals Entered By: Krystal Eaton Duncan Dull) (January 09, 2008 10:12 AM)              Is Patient Diabetic? No  Have you ever been in a relationship where you felt threatened, hurt or afraid?No   Does patient need assistance? Functional Status Self care Ambulation Normal    Flu Vaccine Consent Questions     Do you have a history of severe allergic reactions to this vaccine? no    Any prior history of allergic reactions to egg and/or gelatin? no    Do you have a sensitivity to the preservative Thimersol? no    Do you have a past history of Guillan-Barre Syndrome? no    Do you currently have an acute febrile illness? no    Have you ever had a severe reaction to latex? no    Vaccine information given and explained to patient? yes    Are you currently pregnant? no    Lot Number:[id number] 10/13/2008   Site Given  Left Deltoid IM   Chief Complaint:  routine f/u and c/o "sour stomach" happens about once a mth for 2-3 days.  History of Present Illness: 65 man with HTN.    Current Allergies (reviewed today): No known allergies     Risk Factors: Tobacco use:  quit    Year quit:  1980's Alcohol use:  no Exercise:  yes    Physical Exam  General:     well-developed.   Eyes:     anicteric.  nl injection Neck:     no masses and no thyromegaly.   Lungs:     no crackles and no wheezes.   Heart:     regular rhythm, no murmur, and no JVD.   Abdomen:     soft, non-tender, no masses, no hepatomegaly, and no splenomegaly.   Extremities:     no edema    Impression & Recommendations:  Problem # 1:  HYPERTENSION (ICD-401.9) Not at goal.  Somehow is still on 5 of  amlodipine. No CV symptoms. Has gained weight.   His updated medication list for this problem includes:    Hydrochlorothiazide 25 Mg Tabs (Hydrochlorothiazide) ..... Once daily    Lotensin Tabs (Benazepril hcl tabs) ..... 80mg  once daily    Atenolol 50 Mg Tabs (Atenolol) ..... Once daily    Amlodipine Besylate 10 Mg Tabs (Amlodipine besylate) .Marland Kitchen... Take 1 tablet by mouth once a day  Orders: T-Comprehensive Metabolic Panel ([id number])   Problem # 2:  DISORDER, DEPRESSIVE NEC (ICD-311) Sees a psychiatrist.  On olanzeprine-fluoxetine.   His updated medication list for this problem includes:    Diazepam 5 Mg Tabs (Diazepam) .Marland Kitchen... Take 1 tablet by mouth two times a day   Problem # 3:  HEPATITIS C (ICD-070.51) Sees Dr. Randa Evens yearly. Discussed option of visiting [facility]. Will check Cmet and CBC as screen for cirrhosis.  As long as liver labs are OK, doubt need for further HCC screening.  Orders: T-Comprehensive Metabolic Panel [id number]) T-CBC w/Diff ([id number])   Problem #  4:  COLONOSCOPY, HX OF (ICD-V12.79) Dr. Randa Evens arranges this.  Complete Medication List: 1)  Hydrochlorothiazide 25 Mg Tabs (Hydrochlorothiazide) .... Once daily 2)  Lotensin Tabs (Benazepril hcl tabs) .... 80mg  once daily 3)  Darvocet-n 100 Tabs (Propoxyphene n-apap tabs) .... Take three times a day as needed for pain 4)  Flomax 0.4 Mg Cp24 (Tamsulosin hcl) .... At bedtime (dr. Isabel Caprice) 5)  Atenolol 50 Mg Tabs (Atenolol) .... Once daily 6)  Amlodipine Besylate 10 Mg Tabs (Amlodipine besylate) .... Take 1 tablet by mouth once a day 7)  Diazepam 5 Mg Tabs (Diazepam) .... Take 1 tablet by mouth two times a day  Other Orders: Admin 1st Vaccine (09811) Flu Vaccine 72yrs + (91478)   Patient Instructions: 1)  Please schedule a follow-up appointment in 6 months.   Prescriptions: AMLODIPINE BESYLATE 10 MG TABS (AMLODIPINE BESYLATE) Take 1 tablet by mouth once a day  #30 Tablet x 12    Entered and Authorized by:   Ulyess Mort MD   Signed by:   Ulyess Mort MD on 01/09/2008   Method used:   [facility]        [facility] [address] [phone]* (retail)       [address]       [place], [place]  [address]       Ph: [phone] or [phone]       Fax: [phone]   RxID:   [id number]  ]

## 2010-05-16 NOTE — Progress Notes (Signed)
Summary: refill/hla  Phone Note Refill Request Message from:  Fax from Pharmacy  Refills Requested: Medication #1:  DARVOCET-N 100  TABS instructions from 12/25/2004 appt: take 1 tablet twice daily as needed pain  Initial call taken by: Marin Roberts RN,  June 26, 2006 3:18 PM    Prescriptions: DARVOCET-N 100  TABS (PROPOXYPHENE N-APAP TABS)   #120 x 5   Entered and Authorized by:   Manning Charity MD   Signed by:   Manning Charity MD on 06/26/2006   Method used:   [facility]ph[facility] ...       [facility]  [place]             , [place]    [place]       Ph: [phone]       Fax: [phone]   RxID:   [id number]

## 2010-05-16 NOTE — Miscellaneous (Signed)
Summary: vaccine record  vaccine record   Imported By: Margie Billet 02/26/2006 18:21:35  _____________________________________________________________________  External Attachment:    Type:   Image     Comment:   External Document

## 2010-05-16 NOTE — Progress Notes (Signed)
Summary: Refill/gh  Phone Note Refill Request Message from:  Fax from Pharmacy on December 01, 2008 2:02 PM  Refills Requested: Medication #1:  DARVOCET-N 100  TABS Take three times a day as needed for pain   Last Refilled: 11/02/2008  Method Requested: Electronic Initial call taken by: Angelina Ok RN,  December 01, 2008 2:03 PM  Follow-up for Phone Call        Refill approved-nurse to complete Follow-up by: Ulyess Mort MD,  December 02, 2008 3:10 PM  Additional Follow-up for Phone Call Additional follow up Details #1::        Rx faxed to pharmacy Additional Follow-up by: Angelina Ok RN,  December 02, 2008 4:09 PM    Prescriptions: DARVOCET-N 100  TABS (PROPOXYPHENE N-APAP TABS) Take three times a day as needed for pain  #90 x 6   Entered and Authorized by:   Ulyess Mort MD   Signed by:   Ulyess Mort MD on 12/02/2008   Method used:   Telephoned to ...       [facility] [address] [phone]* (retail)       [address]       [place], [place]  [address]       Ph: [phone] or [phone]       Fax: [phone]   RxID:   [id number]

## 2010-05-16 NOTE — Assessment & Plan Note (Signed)
Summary: FU VISIT/DS   Vital Signs:  Patient profile:   55 year old male Height:      69 inches (175.26 cm) Weight:      220.5 pounds (100.23 kg) BMI:     32.68 Temp:     97.2 degrees F (36.22 degrees C) oral Pulse rate:   89 / minute BP sitting:   152 / 99  (right arm) Cuff size:   regular  Vitals Entered By: Krystal Eaton Duncan Dull) (May 06, 2009 10:42 AM) CC: discuss pain options for chronic HAs-3-4x a week Is Patient Diabetic? No Pain Assessment Patient in pain? yes     Location: headache Intensity: 3 Type: dull Onset of pain  Chronic s/p injury in 2002 Nutritional Status BMI of > 30 = obese  Have you ever been in a relationship where you felt threatened, hurt or afraid?No   Does patient need assistance? Functional Status Self care Ambulation Normal   CC:  discuss pain options for chronic HAs-3-4x a week.  History of Present Illness: 39 man with HTN and headaches.  He has had almost daily headaches for about 8 years.  Sometimes unilateral, sometimes bilateral.  Never has other CNS symptoms.  Has done very well on darvocet three times a day for many years.  No abuse of this drug and no hx of abuse.  Is on low dose valium and on a med for depression from Mental Health.  Preventive Screening-Counseling & Management  Alcohol-Tobacco     Smoking Status: quit     Year Quit: 1980's  Allergies: No Known Drug Allergies   Impression & Recommendations:  Problem # 1:  HEADACHE (ICD-784.0) As per HPI.  This is not new or changed in any way.  Devoted visit to detailed discussion of opiate rules.  I believe he will be adherent.  Basically this is simply a substitute of vicodin for darvocet because the latter has been pulled from use.   The following medications were removed from the medication list:    Acetaminophen-codeine #3 300-30 Mg Tabs (Acetaminophen-codeine) .Marland Kitchen... Take 1 every 6 hours as needed for pain His updated medication list for this problem includes:  Atenolol 50 Mg Tabs (Atenolol) ..... Once daily    Tramadol-acetaminophen 37.5-325 Mg Tabs (Tramadol-acetaminophen) .Marland Kitchen... Take one every 6 hours as needed for pain    Hydrocodone-acetaminophen 5-500 Mg Tabs (Hydrocodone-acetaminophen) ..... May take up to 3 a day for pain  Complete Medication List: 1)  Hydrochlorothiazide 25 Mg Tabs (Hydrochlorothiazide) .... Once daily 2)  Flomax 0.4 Mg Cp24 (Tamsulosin hcl) .... At bedtime (dr. Isabel Caprice) 3)  Atenolol 50 Mg Tabs (Atenolol) .... Once daily 4)  Amlodipine Besylate 10 Mg Tabs (Amlodipine besylate) .... Take 1 tablet by mouth once a day 5)  Diazepam 5 Mg Tabs (Diazepam) .... Take 1 tablet by mouth two times a day 6)  Lotensin 40 Mg Tabs (Benazepril hcl) .... 2 tabs once daily 7)  Symbyax 6-50 Mg Caps (Olanzapine-fluoxetine hcl) .... Take 1 at bedtime 8)  Tramadol-acetaminophen 37.5-325 Mg Tabs (Tramadol-acetaminophen) .... Take one every 6 hours as needed for pain 9)  Hydrocodone-acetaminophen 5-500 Mg Tabs (Hydrocodone-acetaminophen) .... May take up to 3 a day for pain  Patient Instructions: 1)  Please schedule a follow-up appointment in 2 months. Prescriptions: HYDROCODONE-ACETAMINOPHEN 5-500 MG TABS (HYDROCODONE-ACETAMINOPHEN) May take up to 3 a day for pain  #90 x 3   Entered and Authorized by:   Ulyess Mort MD   Signed by:   Ulyess Mort MD  on 05/06/2009   Method used:   Print then Give to Patient   RxID:   [id number] ATENOLOL 50 MG TABS (ATENOLOL) once daily  #31 Tablet x 5   Entered and Authorized by:   Ulyess Mort MD   Signed by:   Ulyess Mort MD on 05/06/2009   Method used:   Print then Give to Patient   RxID:   [id number]    Prevention & Chronic Care Immunizations   Influenza vaccine: Fluvax 3+  (01/09/2008)    Tetanus booster: Not documented    Pneumococcal vaccine: Not documented  Colorectal Screening   Hemoccult: Not documented    Colonoscopy: Not documented  Other Screening   PSA: Not  documented   Smoking status: quit  (05/06/2009)  Lipids   Total Cholesterol: 188  (02/18/2009)   LDL: 124  (02/18/2009)   LDL Direct: Not documented   HDL: 40  (02/18/2009)   Triglycerides: 119  (02/18/2009)  Hypertension   Last Blood Pressure: 152 / 99  (05/06/2009)   Serum creatinine: 1.11  (02/18/2009)   Serum potassium 3.6  (02/18/2009)  Self-Management Support :    Hypertension self-management support: Not documented

## 2010-05-16 NOTE — Progress Notes (Signed)
Summary: refill/ hla  Phone Note Refill Request Message from:  Fax from Pharmacy on December 24, 2007 3:11 PM  Refills Requested: Medication #1:  DIAZEPAM 5 MG TABS Take 1 tablet by mouth two times a day   Last Refilled: 7/14 Initial call taken by: Marin Roberts RN,  December 24, 2007 3:11 PM  Follow-up for Phone Call        Refill approved-nurse to complete Follow-up by: Ulyess Mort MD,  December 25, 2007 10:14 AM  Additional Follow-up for Phone Call Additional follow up Details #1::        Rx faxed to pharmacy Additional Follow-up by: Merrie Roof RN,  December 26, 2007 4:34 PM      Prescriptions: DIAZEPAM 5 MG TABS (DIAZEPAM) Take 1 tablet by mouth two times a day  #62 x 6   Entered and Authorized by:   Ulyess Mort MD   Signed by:   Ulyess Mort MD on 12/25/2007   Method used:   Printed then faxed to ...       [facility] [address] [phone]* (retail)       [address]       [place], [place]  [address]       Ph: [phone] or [phone]       Fax: [phone]   RxID:   [id number]

## 2010-05-18 NOTE — Progress Notes (Signed)
Summary: refill/gg  Phone Note Refill Request  on March 27, 2010 4:11 PM  Refills Requested: Medication #1:  DIAZEPAM 5 MG TABS Take 1 tablet by mouth two times a day   Last Refilled: 02/28/2010  Method Requested: Fax to Local Pharmacy Initial call taken by: Merrie Roof RN,  March 27, 2010 4:11 PM  Follow-up for Phone Call        Refill approved-nurse to complete Follow-up by: Ulyess Mort MD,  March 29, 2010 3:07 PM  Additional Follow-up for Phone Call Additional follow up Details #1::        Rx faxed to pharmacy Additional Follow-up by: Merrie Roof RN,  March 29, 2010 3:36 PM    Prescriptions: DIAZEPAM 5 MG TABS (DIAZEPAM) Take 1 tablet by mouth two times a day  #62 x 6   Entered and Authorized by:   Ulyess Mort MD   Signed by:   Ulyess Mort MD on 03/29/2010   Method used:   Telephoned to ...       [facility] [address] [phone]* (retail)       [address]       [place], [place]  [address]       Ph: [phone] or [phone]       Fax: [phone]   RxID:   [id number]

## 2010-05-25 ENCOUNTER — Other Ambulatory Visit: Payer: Self-pay | Admitting: *Deleted

## 2010-05-26 MED ORDER — HYDROCODONE-ACETAMINOPHEN 5-500 MG PO TABS: 1.0000 | ORAL_TABLET | Freq: Three times a day (TID) | ORAL | Status: DC | PRN

## 2010-06-02 ENCOUNTER — Ambulatory Visit: Payer: Self-pay | Admitting: Internal Medicine

## 2010-07-28 ENCOUNTER — Encounter: Payer: Self-pay | Admitting: Ophthalmology

## 2010-09-01 NOTE — Op Note (Signed)
NAME:  Greg Flowers, Greg Flowers                          ACCOUNT NO.:  [id number]   MEDICAL RECORD NO.:  [id number]                   PATIENT TYPE:  AMB   LOCATION:  ENDO                                 FACILITY:  [facility]   PHYSICIAN:  James L. Malon Kindle., M.D.          DATE OF BIRTH:  23-Jul-1955   DATE OF PROCEDURE:  05/21/2003  DATE OF DISCHARGE:                                 OPERATIVE REPORT   PROCEDURE:  Colonoscopy.   MEDICATIONS:  Fentanyl 125 mcg, Versed 11 mg IV.   SCOPE:  Olympus pediatric scope.   INDICATIONS FOR PROCEDURE:  Colon cancer screening.   DESCRIPTION OF PROCEDURE:  The procedure had been explained to the patient  and consent obtained. With the patient in the left lateral decubitus  position, the Olympus scope was inserted and advanced under direct  visualization. The prep was excellent. We were able to easily reach the  cecum. The ileocecal valve and appendiceal orifice were seen. The scope was  withdrawn and the cecum, ascending colon, transverse colon, descending and  sigmoid colon were seen well.  No polyps were seen.  Moderate diverticulosis  in the sigmoid colon.  The scope was withdrawn. The patient tolerated the  procedure well.   ASSESSMENT:  1. Normal screening colonoscopy, B76.51.  2. Diverticulosis, 562.10.   PLAN:  Yearly Hemoccults and a diverticulosis information sheet and see back  as needed.                                               James L. Malon Kindle., M.D.    Waldron Session  D:  05/21/2003  T:  05/22/2003  Job:  [id number]   cc:   C. Ulyess Mort, M.D.  [address]  [place]  [place] [address]  Fax: [phone]

## 2010-09-01 NOTE — Op Note (Signed)
Greg Flowers, Greg Flowers                          ACCOUNT NO.:  [id number]   MEDICAL RECORD NO.:  [id number]                   PATIENT TYPE:  INP   LOCATION:  2899                                 FACILITY:  [facility]   PHYSICIAN:  Stefani Dama, M.D.               DATE OF BIRTH:  1956/02/02   DATE OF PROCEDURE:  06/01/2002  DATE OF DISCHARGE:                                 OPERATIVE REPORT   PREOPERATIVE DIAG1NOSES:  1. Cervical spondylosis, C5-6.  2. Herniated nucleus pulposus, C6-7, with left cervical radiculopathy.   POSTOPERATIVE DIAGNOSES:  1. Cervical spondylosis, C5-6.  2. Herniated nucleus pulposus, C6-7, with left cervical radiculopathy.   PROCEDURES:  1. Anterior cervical decompression, C5-6, C6-7.  2. Arthrodesis with structural allograft, C5-6, C6-7.  3. Synthes fixation, C5 to C7.   SURGEON:  Stefani Dama, M.D.   ASSISTANT:  Hilda Lias, M.D.   ANESTHESIA:  General endotracheal.   INDICATIONS:  The patient is a 55 year old individual who has had  significant neck, shoulder, and left arm pain with weakness in the region of  the triceps.  Greg Flowers has a herniated nucleus pulposus eccentric to the left side  at the C6-7 level.  Greg Flowers is advised regarding surgical decompression and  stabilization at that level.  Greg Flowers was taken to the operating room.   DESCRIPTION OF PROCEDURE:  The patient was brought to the operating room,  placed on the table in supine position.  After the smooth induction of  general endotracheal anesthesia, Greg Flowers was placed in five pounds of Holter  traction, the neck was shaved, prepped with Duraprep, and draped in a  sterile fashion.  A transverse incision was made in the midportion of the  neck and carried down through the platysma.  The plane between the  sternocleidomastoid and the strap muscles was dissected bluntly until the  prevertebral space was reached.  The first identifiable disk space was noted  to be that of C5-6 on a localizing  radiograph.  A diskectomy was then  created at C5-6, removing a small ventral osteophyte at the C5-6 level and  opening the disk space widely with a 15 blade and then a combination of  curettes and rongeurs being used to provide anterior decompression.  The  posterior longitudinal ligament area was reached and on the left side there  was significant uncinate spur and a posterior spur off the inferior margin  of the body of C5.  This was taken down with a high-speed air drill and 2.3  mm dissecting tool.  The thickened, redundant posterior longitudinal  ligament was then taken up with a 2 mm Kerrison punch, and the 3 mm Kerrison  punch was similarly used to dissect this area free.  Once this area was  opened from side to side, then hemostasis was achieved in the posterior  longitudinal ligament region and attention was turned to C6-7, where a  similar procedure was carried out.  Here on the left side there was a  significant fragment of disk material herniated behind a modestly  spondylitic ridge.  The ridge was taken down with a 2 mm drill and the high-  speed dissecting tool and the posterior longitudinal ligament was opened  from side to side, returning all the small fragments of disk material in  this region, allowing for good decompression of the C7 nerve root on that  left side.  Hemostasis was then achieved in a similar fashion.  Once this  was accomplished adequately, a 7 mm patellar allograft with a unicortical  surface facing the ventral aspect was placed into the interspace.  The  dorsal aspects were trimmed appropriately.  After both grafts were placed,  the traction was removed.  A standard-size Synthes 37 mm plate was then  affixed using six locking 4 x 14 mm screws.  Placement of the graft was  attempted radiographically; however, only the top of the plate could be seen  and was, in fact, over the C5-6 interspace.  With this, the wound was  checked triply for hemostasis.  Once  this was adequately assured, the  platysma was closed with 3-0 Vicryl in interrupted fashion and 3-0 Vicryl  was used in the subcuticular tissues, and Dermabond was placed on the skin.  The patient tolerated the procedure well and was returned to the recovery  room in stable condition.                                                Stefani Dama, M.D.    Merla Riches  D:  06/01/2002  T:  06/01/2002  Job:  [id number]

## 2010-10-02 ENCOUNTER — Other Ambulatory Visit: Payer: Self-pay | Admitting: Internal Medicine

## 2010-10-02 NOTE — Telephone Encounter (Signed)
Mr. Vivero has no visit or labs since last summer.  He should be seen.  I did the refill.

## 2010-10-04 ENCOUNTER — Other Ambulatory Visit: Payer: Self-pay | Admitting: *Deleted

## 2010-10-04 MED ORDER — HYDROCODONE-ACETAMINOPHEN 5-500 MG PO TABS: 1.0000 | ORAL_TABLET | Freq: Three times a day (TID) | ORAL | Status: DC | PRN

## 2010-10-04 NOTE — Telephone Encounter (Signed)
Needs appointment.  Will do the refill.  Please call in med.

## 2010-10-05 NOTE — Telephone Encounter (Signed)
[facility] 5/[facility]mg  [facility] Pt has an appt scheduled 7/27 w/Dr. Aundria Rud.

## 2010-10-06 ENCOUNTER — Telehealth: Payer: Self-pay | Admitting: *Deleted

## 2010-10-10 MED ORDER — DIAZEPAM 5 MG PO TABS: 5.0000 mg | ORAL_TABLET | Freq: Two times a day (BID) | ORAL | Status: DC

## 2010-10-10 NOTE — Telephone Encounter (Signed)
[facility] 

## 2010-11-10 ENCOUNTER — Ambulatory Visit (INDEPENDENT_AMBULATORY_CARE_PROVIDER_SITE_OTHER): Payer: Federal, State, Local not specified - PPO | Admitting: Internal Medicine

## 2010-11-10 ENCOUNTER — Encounter: Payer: Self-pay | Admitting: Internal Medicine

## 2010-11-10 VITALS — BP 125/81 | HR 79 | Temp 97.8°F | Wt 207.8 lb

## 2010-11-10 DIAGNOSIS — I10 Essential (primary) hypertension: Secondary | ICD-10-CM

## 2010-11-10 LAB — COMPREHENSIVE METABOLIC PANEL
ALT: 35 U/L (ref 0–53)
AST: 36 U/L (ref 0–37)
Albumin: 3.9 g/dL (ref 3.5–5.2)
Alkaline Phosphatase: 63 U/L (ref 39–117)
BUN: 10 mg/dL (ref 6–23)
CO2: 29 mEq/L (ref 19–32)
Calcium: 9.7 mg/dL (ref 8.4–10.5)
Chloride: 101 mEq/L (ref 96–112)
Creat: 1.26 mg/dL (ref 0.50–1.35)
Glucose, Bld: 132 mg/dL — ABNORMAL HIGH (ref 70–99)
Potassium: 3.2 mEq/L — ABNORMAL LOW (ref 3.5–5.3)
Sodium: 140 mEq/L (ref 135–145)
Total Bilirubin: 0.4 mg/dL (ref 0.3–1.2)
Total Protein: 6.8 g/dL (ref 6.0–8.3)

## 2010-11-10 NOTE — Progress Notes (Signed)
64 man comes for annual visit.  Has mild HTN, chronic (asymptomatic) hepatitis c, depression.  Doing well.  Has completed 30 years with [employer] and is anxious to retire from high stress job.  Married with 57 yo dgt who is a typical stressor. Other problems seem under control. Follows with psychiatrist for depression -- on olanzepine and fluoxitine. Has not seen Dr. Randa Evens (GI) in over a year.  Had failed trial of rx for hep c and decision was to defer repeat rx.  Anicteric.  no bruit or goitre Lungs clear.  Heart regular with soft systolic murmur. Abd soft w/o masses. No edema.

## 2010-11-23 ENCOUNTER — Other Ambulatory Visit (INDEPENDENT_AMBULATORY_CARE_PROVIDER_SITE_OTHER): Payer: Federal, State, Local not specified - PPO

## 2010-11-23 DIAGNOSIS — E876 Hypokalemia: Secondary | ICD-10-CM

## 2010-11-24 LAB — BASIC METABOLIC PANEL
BUN: 14 mg/dL (ref 6–23)
CO2: 32 mEq/L (ref 19–32)
Calcium: 9.2 mg/dL (ref 8.4–10.5)
Chloride: 102 mEq/L (ref 96–112)
Creat: 1.21 mg/dL (ref 0.50–1.35)
Glucose, Bld: 160 mg/dL — ABNORMAL HIGH (ref 70–99)
Potassium: 3.9 mEq/L (ref 3.5–5.3)
Sodium: 140 mEq/L (ref 135–145)

## 2010-12-03 ENCOUNTER — Other Ambulatory Visit: Payer: Self-pay | Admitting: Internal Medicine

## 2010-12-15 ENCOUNTER — Telehealth: Payer: Self-pay | Admitting: *Deleted

## 2010-12-15 NOTE — Telephone Encounter (Signed)
Call from patient requesting letter on hospital letterhead

## 2010-12-15 NOTE — Telephone Encounter (Signed)
Call from pt requesting a letter to his employer ([employer]) on hospital letterhead stating "Greg Flowers is a patient of mine and is undergoing treatment of a medical condition that requires him to frequent the use of a restroom.  Please do not interfere with this activity".  Pt would like to pick up the letter or have it mailed to his address. Please advise.[name]8/31/20122:52 PM

## 2010-12-19 ENCOUNTER — Encounter: Payer: Self-pay | Admitting: Internal Medicine

## 2011-01-24 ENCOUNTER — Other Ambulatory Visit: Payer: Self-pay | Admitting: *Deleted

## 2011-01-30 MED ORDER — HYDROCHLOROTHIAZIDE 25 MG PO TABS: 25.0000 mg | ORAL_TABLET | Freq: Every day | ORAL | Status: DC

## 2011-02-06 ENCOUNTER — Other Ambulatory Visit: Payer: Self-pay | Admitting: *Deleted

## 2011-02-08 MED ORDER — ATENOLOL 50 MG PO TABS: 50.0000 mg | ORAL_TABLET | Freq: Every day | ORAL | Status: DC

## 2011-04-01 ENCOUNTER — Other Ambulatory Visit: Payer: Self-pay | Admitting: Internal Medicine

## 2011-04-23 ENCOUNTER — Other Ambulatory Visit: Payer: Self-pay | Admitting: *Deleted

## 2011-04-24 MED ORDER — HYDROCODONE-ACETAMINOPHEN 5-500 MG PO TABS: 1.0000 | ORAL_TABLET | Freq: Three times a day (TID) | ORAL | Status: DC | PRN

## 2011-04-24 MED ORDER — DIAZEPAM 5 MG PO TABS: 5.0000 mg | ORAL_TABLET | Freq: Two times a day (BID) | ORAL | Status: DC

## 2011-04-25 NOTE — Telephone Encounter (Signed)
Refill for Diazepam 5 mg tablets and Vicodin 5/500 [facility]  Angelina Ok, RN 04/25/2011 2:12 PM

## 2011-05-29 ENCOUNTER — Other Ambulatory Visit: Payer: Self-pay | Admitting: *Deleted

## 2011-05-29 MED ORDER — DIAZEPAM 5 MG PO TABS: 5.0000 mg | ORAL_TABLET | Freq: Two times a day (BID) | ORAL | Status: DC

## 2011-05-29 MED ORDER — HYDROCODONE-ACETAMINOPHEN 5-500 MG PO TABS: 1.0000 | ORAL_TABLET | Freq: Three times a day (TID) | ORAL | Status: DC | PRN

## 2011-05-29 NOTE — Telephone Encounter (Signed)
Called to pharm 

## 2011-10-11 ENCOUNTER — Other Ambulatory Visit: Payer: Self-pay | Admitting: Internal Medicine

## 2011-10-11 NOTE — Telephone Encounter (Signed)
Flag sent to front desk pool to make appt before next refill.

## 2011-10-11 NOTE — Telephone Encounter (Signed)
Needs appointment before next refill.

## 2011-11-02 ENCOUNTER — Encounter: Payer: Self-pay | Admitting: Internal Medicine

## 2011-11-02 ENCOUNTER — Ambulatory Visit (INDEPENDENT_AMBULATORY_CARE_PROVIDER_SITE_OTHER): Payer: Federal, State, Local not specified - PPO | Admitting: Internal Medicine

## 2011-11-02 VITALS — BP 134/99 | HR 99 | Temp 97.0°F | Wt 211.8 lb

## 2011-11-02 DIAGNOSIS — B1710 Acute hepatitis C without hepatic coma: Secondary | ICD-10-CM

## 2011-11-02 DIAGNOSIS — M170 Bilateral primary osteoarthritis of knee: Secondary | ICD-10-CM | POA: Insufficient documentation

## 2011-11-02 DIAGNOSIS — F329 Major depressive disorder, single episode, unspecified: Secondary | ICD-10-CM

## 2011-11-02 DIAGNOSIS — I10 Essential (primary) hypertension: Secondary | ICD-10-CM

## 2011-11-02 DIAGNOSIS — B1920 Unspecified viral hepatitis C without hepatic coma: Secondary | ICD-10-CM

## 2011-11-02 DIAGNOSIS — F3289 Other specified depressive episodes: Secondary | ICD-10-CM

## 2011-11-02 LAB — LIPID PANEL
Cholesterol: 155 mg/dL (ref 0–200)
HDL: 39 mg/dL — ABNORMAL LOW (ref >39)
LDL Cholesterol: 99 mg/dL (ref 0–99)
Total CHOL/HDL Ratio: 4 Ratio
Triglycerides: 86 mg/dL (ref <150)
VLDL: 17 mg/dL (ref 0–40)

## 2011-11-02 MED ORDER — DIAZEPAM 5 MG PO TABS: 5.0000 mg | ORAL_TABLET | Freq: Two times a day (BID) | ORAL | Status: DC

## 2011-11-02 MED ORDER — IBUPROFEN 200 MG PO TABS: 800.0000 mg | ORAL_TABLET | Freq: Three times a day (TID) | ORAL | Status: AC

## 2011-11-02 MED ORDER — HYDROCODONE-ACETAMINOPHEN 5-500 MG PO TABS: 1.0000 | ORAL_TABLET | Freq: Three times a day (TID) | ORAL | Status: DC | PRN

## 2011-11-02 NOTE — Progress Notes (Signed)
Annual visit.  Only problem is pain and stiffness of knees; slowly worse over time.  Has been on hydrocodone-ACET 5 tid and has been taking over 3 a day sometimes.  Has never tried ibuprofen.  No contraindication.  Will advise OTC ibuprofen 800 tid along with hydrocodone for 2-3 month trial  Agreed to small increase in opiate if trial fails.

## 2011-11-02 NOTE — Assessment & Plan Note (Signed)
On meds as listed.  No CV sx.  BP controlled.

## 2011-11-02 NOTE — Assessment & Plan Note (Signed)
Regulat app'ts with psychiatrist.  On olanzepine and fluoxitine from psych.  Low dose valium from [place]. Doing well.  Less stress at work although adolescent daughter is a stress at home.

## 2011-11-02 NOTE — Assessment & Plan Note (Signed)
Check cmet today.  No sx.  Described possible new promising treatment trials and likely need to see Dr. Randa Evens in next 6 -- 12 months.

## 2011-11-03 LAB — COMPREHENSIVE METABOLIC PANEL
ALT: 35 U/L (ref 0–53)
AST: 27 U/L (ref 0–37)
Albumin: 4.3 g/dL (ref 3.5–5.2)
Alkaline Phosphatase: 58 U/L (ref 39–117)
BUN: 12 mg/dL (ref 6–23)
CO2: 31 mEq/L (ref 19–32)
Calcium: 9.7 mg/dL (ref 8.4–10.5)
Chloride: 101 mEq/L (ref 96–112)
Creat: 1.3 mg/dL (ref 0.50–1.35)
Glucose, Bld: 102 mg/dL — ABNORMAL HIGH (ref 70–99)
Potassium: 3.8 mEq/L (ref 3.5–5.3)
Sodium: 140 mEq/L (ref 135–145)
Total Bilirubin: 0.4 mg/dL (ref 0.3–1.2)
Total Protein: 6.9 g/dL (ref 6.0–8.3)

## 2011-11-20 ENCOUNTER — Other Ambulatory Visit: Payer: Self-pay | Admitting: Internal Medicine

## 2011-12-07 ENCOUNTER — Ambulatory Visit: Payer: Federal, State, Local not specified - PPO | Admitting: Internal Medicine

## 2012-02-02 ENCOUNTER — Other Ambulatory Visit: Payer: Self-pay | Admitting: Internal Medicine

## 2012-02-04 NOTE — Telephone Encounter (Signed)
PCP requested OCt appt. No appt in system. Pls sch with PCP first available.

## 2012-02-04 NOTE — Telephone Encounter (Signed)
Message sent to front desk to sched pt an appt. 

## 2012-02-12 ENCOUNTER — Ambulatory Visit (INDEPENDENT_AMBULATORY_CARE_PROVIDER_SITE_OTHER): Payer: Federal, State, Local not specified - PPO | Admitting: Internal Medicine

## 2012-02-12 ENCOUNTER — Ambulatory Visit (HOSPITAL_COMMUNITY)
Admission: RE | Admit: 2012-02-12 | Discharge: 2012-02-12 | Disposition: A | Discharge status: Home / Self Care | Payer: Federal, State, Local not specified - PPO | Source: Ambulatory Visit | Attending: Internal Medicine | Admitting: Internal Medicine

## 2012-02-12 ENCOUNTER — Encounter: Payer: Self-pay | Admitting: Internal Medicine

## 2012-02-12 VITALS — BP 132/88 | HR 84 | Temp 96.3°F | Ht 69.0 in | Wt 202.2 lb

## 2012-02-12 DIAGNOSIS — M542 Cervicalgia: Secondary | ICD-10-CM

## 2012-02-12 DIAGNOSIS — M25569 Pain in unspecified knee: Secondary | ICD-10-CM | POA: Insufficient documentation

## 2012-02-12 DIAGNOSIS — Z0000 Encounter for general adult medical examination without abnormal findings: Secondary | ICD-10-CM | POA: Insufficient documentation

## 2012-02-12 DIAGNOSIS — M170 Bilateral primary osteoarthritis of knee: Secondary | ICD-10-CM

## 2012-02-12 DIAGNOSIS — Z23 Encounter for immunization: Secondary | ICD-10-CM

## 2012-02-12 DIAGNOSIS — M898X9 Other specified disorders of bone, unspecified site: Secondary | ICD-10-CM | POA: Insufficient documentation

## 2012-02-12 DIAGNOSIS — M549 Dorsalgia, unspecified: Secondary | ICD-10-CM

## 2012-02-12 DIAGNOSIS — I10 Essential (primary) hypertension: Secondary | ICD-10-CM

## 2012-02-12 DIAGNOSIS — M1710 Unilateral primary osteoarthritis, unspecified knee: Secondary | ICD-10-CM

## 2012-02-12 DIAGNOSIS — M1120 Other chondrocalcinosis, unspecified site: Secondary | ICD-10-CM | POA: Insufficient documentation

## 2012-02-12 MED ORDER — HYDROCODONE-ACETAMINOPHEN 7.5-325 MG PO TABS: 1.0000 | ORAL_TABLET | Freq: Four times a day (QID) | ORAL | Status: DC | PRN

## 2012-02-12 MED ORDER — OMEPRAZOLE 20 MG PO CPDR: 20.0000 mg | DELAYED_RELEASE_CAPSULE | Freq: Every day | ORAL | Status: DC

## 2012-02-12 NOTE — Patient Instructions (Signed)
For your knee osteoarthritis, we are increasing your dose of Hydrocodone-acetaminophen to 7.5-325 mg.  Take 1 tablet every 6 hours as needed for pain -we are ordering x-rays of your knees today.  In the future, we can consider steroid injections for treatment of knee pain.  For your symptoms of acid reflux, we are prescribing Omeprazole.  Take 1 tablet every morning on an empty stomach, 30 minutes before breakfast.  Please return for a follow-up visit with your primary physician in 3-4 months.

## 2012-02-12 NOTE — Progress Notes (Signed)
HPI The patient is a 56 y.o. yo male with a history of osteoarthritis of both knees, HTN, HCV, presenting for a follow-up visit.  The patient was seen for a visit 3 months ago for osteoarthritis.  The patient was asked to try a trial of ibuprofen.  Since then, he has been taking advil 800 mg twice per day.  This has helped his knee pain somewhat, reducing it from a 7/10-4/10, though he still notes that his knee pain interferes with his activity level.  He has been taking hydrocodone 5-325 mg 4-5 times per day, and typically runs out of his medication after about 20 days, and has 10 days where he just manages his pain with advil and tylenol.  The patient also notes bilateral shoulder pain for the last 3 months, which he describes as a dull ache, worse after heavy lifting for long periods of time.  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  The patient also notes mild heartburn and epigastric tenderness over the last 1-2 months.  ROS: General: no fevers, chills, changes in weight, changes in appetite Skin: no rash HEENT: no blurry vision, hearing changes, sore throat Pulm: no dyspnea, coughing, wheezing CV: no chest pain, palpitations, shortness of breath Abd: no abdominal pain, nausea/vomiting, diarrhea/constipation GU: no dysuria, hematuria, polyuria Ext: see HPI Neuro: no weakness, numbness, or tingling  Filed Vitals:   02/12/12 1059  BP: 132/88  Pulse:   Temp:     PEX General: alert, cooperative, and in no apparent distress HEENT: pupils equal round and reactive to light, vision grossly intact, oropharynx clear and non-erythematous  Neck: supple, no lymphadenopathy Lungs: clear to ascultation bilaterally, normal work of respiration, no wheezes, rales, ronchi Heart: regular rate and rhythm, no murmurs, gallops, or rubs Abdomen: soft, mildly tender to epigastric palpation, non-distended, normal bowel sounds, no guarding or rebound tenderness Msk: Tests for impingement,  or supraspinatus, infraspinatus, and subscapularis injury negative bilaterally.  Glenohumeral joints with no deformity.  Bilateral knees with no edema or effusion. Extremities: no cyanosis, clubbing, or edema Neurologic: alert & oriented X3, cranial nerves II-XII intact, strength grossly intact, sensation intact to light touch  Current Outpatient Prescriptions on File Prior to Visit  Medication Sig Dispense Refill  . amLODipine (NORVASC) 10 MG tablet TAKE 1 TABLET BY MOUTH EVERY DAY  30 tablet  6  . atenolol (TENORMIN) 50 MG tablet Take 1 tablet (50 mg total) by mouth daily.  31 tablet  11  . benazepril (LOTENSIN) 40 MG tablet TAKE 2 TABLETS BY MOUTH DAILY  60 tablet  6  . diazepam (VALIUM) 5 MG tablet Take 1 tablet (5 mg total) by mouth 2 (two) times daily.  62 tablet  5  . FLUoxetine (PROZAC) 40 MG capsule Use as directed 1 capsule in the mouth or throat Daily.      . hydrochlorothiazide (HYDRODIURIL) 25 MG tablet TAKE 1 TABLET (25 MG TOTAL) BY MOUTH DAILY.  31 tablet  5  . HYDROcodone-acetaminophen (VICODIN) 5-500 MG per tablet Take 1 tablet by mouth every 8 (eight) hours as needed. For pain.  90 tablet  5  . methocarbamol (ROBAXIN) 500 MG tablet Take 1,000 mg by mouth at bedtime.        Marland Kitchen OLANZapine (ZYPREXA) 5 MG tablet Use as directed 1 tablet in the mouth or throat Daily.      . Tamsulosin HCl (FLOMAX) 0.4 MG CAPS Take 0.4 mg by mouth at bedtime.  Assessment/Plan

## 2012-02-12 NOTE — Assessment & Plan Note (Signed)
Well-controlled, continue current regimen 

## 2012-02-12 NOTE — Assessment & Plan Note (Signed)
Flu shot given today

## 2012-02-12 NOTE — Assessment & Plan Note (Addendum)
The patient notes improvement in his knee osteoarthritis since starting ibuprofen at his last visit, but still notes that the pain interferes with his work, and still uses up his month's supply of hydrocodone after about 20 days.  Patient also notes GERD symptoms since starting ibuprofen. -will increase hydrocodone to 7.5-325 mg, 90 tabs/month -continue ibuprofen -will obtain knee x-rays bilateral, since patient has never had imaging of chronic knee pain -based on imaging, we can consider starting steroid injections at patient's next visit if pain continues. -start omeprazole for GERD symptoms, likely exacerbated by ibuprofen usage

## 2012-02-12 NOTE — Assessment & Plan Note (Signed)
The patient has a history of neck and back pain, and now notes shoulder pain, with examination negative for rotator cuff injury or bony abnormality of joint. -pain control as above

## 2012-02-23 ENCOUNTER — Other Ambulatory Visit: Payer: Self-pay | Admitting: Internal Medicine

## 2012-06-19 ENCOUNTER — Other Ambulatory Visit: Payer: Self-pay | Admitting: Internal Medicine

## 2012-06-19 NOTE — Telephone Encounter (Signed)
Please find out when this medication was last filled and for what quantity.

## 2012-06-24 NOTE — Telephone Encounter (Signed)
Rx called in to pharmacy. 

## 2012-07-06 ENCOUNTER — Other Ambulatory Visit: Payer: Self-pay | Admitting: Internal Medicine

## 2012-08-27 ENCOUNTER — Other Ambulatory Visit: Payer: Self-pay | Admitting: *Deleted

## 2012-08-29 MED ORDER — HYDROCODONE-ACETAMINOPHEN 7.5-325 MG PO TABS: 1.0000 | ORAL_TABLET | Freq: Four times a day (QID) | ORAL | Status: DC | PRN

## 2012-08-29 NOTE — Telephone Encounter (Signed)
Hydrocodone rx faxed to [facility].

## 2012-12-09 ENCOUNTER — Encounter: Payer: Self-pay | Admitting: Internal Medicine

## 2012-12-25 ENCOUNTER — Encounter: Payer: Self-pay | Admitting: Internal Medicine

## 2012-12-25 ENCOUNTER — Ambulatory Visit (INDEPENDENT_AMBULATORY_CARE_PROVIDER_SITE_OTHER): Payer: Federal, State, Local not specified - PPO | Admitting: Internal Medicine

## 2012-12-25 VITALS — BP 135/84 | HR 83 | Temp 97.4°F | Ht 69.0 in | Wt 204.3 lb

## 2012-12-25 DIAGNOSIS — M1710 Unilateral primary osteoarthritis, unspecified knee: Secondary | ICD-10-CM

## 2012-12-25 DIAGNOSIS — IMO0002 Reserved for concepts with insufficient information to code with codable children: Secondary | ICD-10-CM

## 2012-12-25 DIAGNOSIS — M170 Bilateral primary osteoarthritis of knee: Secondary | ICD-10-CM

## 2012-12-25 DIAGNOSIS — Z0000 Encounter for general adult medical examination without abnormal findings: Secondary | ICD-10-CM

## 2012-12-25 DIAGNOSIS — F329 Major depressive disorder, single episode, unspecified: Secondary | ICD-10-CM

## 2012-12-25 DIAGNOSIS — B1710 Acute hepatitis C without hepatic coma: Secondary | ICD-10-CM

## 2012-12-25 DIAGNOSIS — I10 Essential (primary) hypertension: Secondary | ICD-10-CM

## 2012-12-25 DIAGNOSIS — Z23 Encounter for immunization: Secondary | ICD-10-CM

## 2012-12-25 DIAGNOSIS — F3289 Other specified depressive episodes: Secondary | ICD-10-CM

## 2012-12-25 NOTE — Progress Notes (Signed)
Subjective:   Patient ID: Greg Flowers male   DOB: Oct 06, 1955 57 y.o.   MRN: [id number]  HPI: GregGreg Flowers is a 57 y.o. M w/ PMHx of HTN, Hep C (failed ribavirin therapy in 2000), Depression, and OA, presents to clinic today for follow up. The patient has no acute medical concerns at this time, however, the patient does suffer from depression and he has been having several emotional hardships recently involving deaths in the family, friends, issues at work, Catering manager. He works at the [employer] and says his job has been incredibly stressful lately as he now has a new boss. The patient follows with a Psychiatrist about every 2 months (Dr. Jennelle Human), to manage his depression, for which he takes Fluoxetine and Olanzapine.   The patient has other minor medical issues for which he feels are being managed adequately. He says his knee pain has been well controlled with NSAIDS, headaches with Norco 7.5-325 mg. When asked about his Norco use, the patient claims he only takes 3-4 per week, when he has pain, but also takes them at night to help him sleep. The patient is also prescribed Valium for anxiety, which he also takes as a sleep aid, and may take 2-3 at a time. The patient has also been prescribed Ambien as a sleep aid from his Psychiatrist. On review of the [place] narcotics database, the patient took roughly 92 Norco per month from 07/2012 to 09/2012, and roughly 45 Norco per month from 09/2012 to 11/2012.   The patient does not complain of any other issues. He denies any abdominal pain, nausea, vomiting, diarrhea, constipation. SOB, or chest pain.   Past Medical History  Diagnosis Date  . Hepatitis C     Has occasional visits with Dr. Randa Evens GI, failed RX in 2000.  Liver Biopsy 9/06: Minimally active hepatitis consistent with hepatitis C, minimal necroinflamtory activity grade 1, no incrased fibrosis stage 0.   . Hypertension   . Renal stone   . Cervical spondylosis 2004    sp surgery by Dr. Danielle Dess  .  Thalassemia     HGB 9/09 14.4 wtih MCV 72.6  . Herpes labialis   . Depression   . Calculus, kidney 2000    Sees Dr. Isabel Caprice, urology.   Marland Kitchen Headache(784.0)     Chronic, on vicodin 5 TID for this.    Current Outpatient Prescriptions  Medication Sig Dispense Refill  . amLODipine (NORVASC) 10 MG tablet TAKE 1 TABLET BY MOUTH EVERY DAY  30 tablet  6  . atenolol (TENORMIN) 50 MG tablet TAKE 1 TABLET (50 MG TOTAL) BY MOUTH DAILY.  31 tablet  9  . benazepril (LOTENSIN) 40 MG tablet TAKE 2 TABLETS BY MOUTH DAILY  60 tablet  6  . diazepam (VALIUM) 5 MG tablet TAKE 1 TABLET BY MOUTH TWICE A DAY  62 tablet  5  . FLUoxetine (PROZAC) 40 MG capsule Use as directed 1 capsule in the mouth or throat Daily.      . hydrochlorothiazide (HYDRODIURIL) 25 MG tablet TAKE 1 TABLET (25 MG TOTAL) BY MOUTH DAILY.  31 tablet  11  . HYDROcodone-acetaminophen (NORCO) 7.5-325 MG per tablet Take 1 tablet by mouth every 6 (six) hours as needed for pain.  90 tablet  3  . OLANZapine (ZYPREXA) 5 MG tablet Use as directed 1 tablet in the mouth or throat Daily.      Marland Kitchen omeprazole (PRILOSEC) 20 MG capsule Take 1 capsule (20 mg total) by mouth  daily.  30 capsule  5  . Tamsulosin HCl (FLOMAX) 0.4 MG CAPS Take 0.4 mg by mouth at bedtime.         No current facility-administered medications for this visit.   No family history on file. History   Social History  . Marital Status: Married    Spouse Name: N/A    Number of Children: N/A  . Years of Education: N/A   Social History Main Topics  . Smoking status: Former Games developer  . Smokeless tobacco: None     Comment: quit in the early 1980's  . Alcohol Use: Yes     Comment: Occasionally  . Drug Use: No  . Sexual Activity: None   Other Topics Concern  . 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    Review of Systems: General: Denies fever, chills, diaphoresis, appetite change and fatigue.  Respiratory: Denies SOB, DOE, cough, chest  tightness, and wheezing.   Cardiovascular: Denies chest pain, palpitations and leg swelling.  Gastrointestinal: Denies nausea, vomiting, abdominal pain, diarrhea, constipation, blood in stool and abdominal distention.  Genitourinary: Denies dysuria, urgency, frequency, hematuria, flank pain and difficulty urinating.  Musculoskeletal: Positive for intermittent knee pain. Denies myalgias, back pain, joint swelling, and gait problem.  Skin: Denies pallor, rash and wounds.  Neurological: Denies dizziness, seizures, syncope, weakness, lightheadedness, numbness and headaches.  Psychiatric/Behavioral: Denies mood changes, confusion, nervousness, sleep disturbance and agitation.  Objective:  Physical Exam: Filed Vitals:   12/25/12 1606  BP: 135/84  Pulse: 83  Temp: 97.4 F (36.3 C)  TempSrc: Oral  Height: 5\' 9"  (1.753 m)  Weight: 204 lb 4.8 oz (92.67 kg)  SpO2: 97%   General: Vital signs reviewed.  Patient is a well-developed and well-nourished, in no acute distress and cooperative with exam. Alert and oriented x3.  Head: Normocephalic and atraumatic. Eyes: PERRL, EOMI, conjunctivae normal, No scleral icterus.  Neck: Supple, trachea midline, normal ROM, No JVD, masses, thyromegaly, or carotid bruit present.  Cardiovascular: RRR, S1 normal, S2 normal, no murmurs, gallops, or rubs. Pulmonary/Chest: Normal respiratory effort, CTAB, no wheezes, rales, or rhonchi. Abdominal: Soft. Non-tender, non-distended, bowel sounds are normal, no masses, organomegaly, or guarding present.  Musculoskeletal: No joint deformities, erythema, or stiffness, ROM full and no nontender. Extremities: No swelling or edema,  pulses symmetric and intact bilaterally. No cyanosis or clubbing. Neurological: A&O x3, Strength is normal and symmetric bilaterally, cranial nerve II-XII are grossly intact, no focal motor deficit, sensory intact to light touch bilaterally.  Skin: Warm, dry and intact. No rashes or  erythema. Psychiatric: Normal mood and affect. speech and behavior is normal. Cognition and memory are normal.   Assessment & Plan:    Please see problem-based assessment and plan.

## 2012-12-25 NOTE — Patient Instructions (Addendum)
1. Please make a follow up appointment with me in 2 months 2. Please take all medications as prescribed.  3. If you have worsening of your symptoms or new symptoms arise, please call the clinic ([phone]), or go to the ER immediately if symptoms are severe.  You have done great job in taking all your medications. I appreciate it very much. Please continue doing that.

## 2012-12-26 LAB — COMPREHENSIVE METABOLIC PANEL
ALT: 61 U/L — ABNORMAL HIGH (ref 0–53)
AST: 47 U/L — ABNORMAL HIGH (ref 0–37)
Albumin: 3.8 g/dL (ref 3.5–5.2)
Alkaline Phosphatase: 59 U/L (ref 39–117)
BUN: 11 mg/dL (ref 6–23)
CO2: 34 mEq/L — ABNORMAL HIGH (ref 19–32)
Calcium: 9.6 mg/dL (ref 8.4–10.5)
Chloride: 101 mEq/L (ref 96–112)
Creat: 1.19 mg/dL (ref 0.50–1.35)
Glucose, Bld: 86 mg/dL (ref 70–99)
Potassium: 3.7 mEq/L (ref 3.5–5.3)
Sodium: 140 mEq/L (ref 135–145)
Total Bilirubin: 0.4 mg/dL (ref 0.3–1.2)
Total Protein: 6.5 g/dL (ref 6.0–8.3)

## 2012-12-26 LAB — LIPID PANEL
Cholesterol: 157 mg/dL (ref 0–200)
HDL: 42 mg/dL (ref >39)
LDL Cholesterol: 87 mg/dL (ref 0–99)
Total CHOL/HDL Ratio: 3.7 Ratio
Triglycerides: 138 mg/dL (ref <150)
VLDL: 28 mg/dL (ref 0–40)

## 2012-12-26 NOTE — Assessment & Plan Note (Signed)
Received flu shot today. 

## 2012-12-26 NOTE — Assessment & Plan Note (Signed)
Well controlled today, within goal at 135/84. Continue with Norvasc, Atenolol, Lotensin, and HCTZ.

## 2012-12-26 NOTE — Assessment & Plan Note (Addendum)
Patient follows with Dr. Jennelle Human, Psychiatrist, every 2 months or so. Mr. Krapf has had many emotional hardships recently with some deaths of family members and co-workers. He sad his depression is well controlled recently, takes Olanzapine and Fluoxetine for this. On further discussion with patient, he says he has a lot of difficulty sleeping and frequently takes Ambien, Norco, and Valium to sleep, obviously concerning for significant over-sedation.  Discussed dangers of taking all of these medications at the same time with the patient.  -Requested medical records from Dr. Alwyn Ren office in regards to medication history and psychiatric history. -Will have patient follow up in 2 months for close monitoring of meds.  -Will consider decreasing/changing Valium dosage or consider a longer acting benzo at next visit.

## 2012-12-26 NOTE — Assessment & Plan Note (Signed)
On chart review, patient failed Ribavirin therapy in 2000, but LFTs back to 10/2010 were wnl. Patient reports no issues with his Hepatitis C and follows infrequently with Dr. Randa Evens at [facility]. -Repeated LFT's today, Showed mild elevation of AST and ALT to 47 and 61, respectively. -Will request past progress notes from Dr. Randa Evens

## 2012-12-26 NOTE — Assessment & Plan Note (Signed)
Patient says his knee pain is well controlled with NSAIDS and also his Norco. He says he only has pain when he works on his feet a lot and has to bend and pick up heavy boxes at the [employer].  -Will have patient follow up in 2 months for close monitoring of Norco usage.

## 2012-12-28 NOTE — Progress Notes (Signed)
I saw and evaluated the patient.  I personally confirmed the key portions of the history and exam documented by Dr. Yetta Barre and I reviewed pertinent patient test results.  The assessment, diagnosis, and plan were formulated together and I agree with the documentation in the resident's note. Greg Flowers is on multiple sedating meds but do not seem to affect his ability to perform his required job duties. There are no red flags, but monitoring usage and getting records are indicated as well as close F/U.

## 2013-01-09 ENCOUNTER — Other Ambulatory Visit: Payer: Self-pay | Admitting: Internal Medicine

## 2013-01-12 NOTE — Telephone Encounter (Signed)
Valium 5 mg rx called [facility]. 

## 2013-01-13 ENCOUNTER — Encounter: Payer: Self-pay | Admitting: Internal Medicine

## 2013-01-16 ENCOUNTER — Other Ambulatory Visit: Payer: Self-pay | Admitting: Internal Medicine

## 2013-02-05 ENCOUNTER — Other Ambulatory Visit: Payer: Self-pay | Admitting: Internal Medicine

## 2013-02-26 ENCOUNTER — Other Ambulatory Visit: Payer: Self-pay | Admitting: Internal Medicine

## 2013-03-23 ENCOUNTER — Other Ambulatory Visit: Payer: Self-pay | Admitting: Internal Medicine

## 2013-03-24 ENCOUNTER — Ambulatory Visit (INDEPENDENT_AMBULATORY_CARE_PROVIDER_SITE_OTHER): Payer: Federal, State, Local not specified - PPO | Admitting: Internal Medicine

## 2013-03-24 ENCOUNTER — Encounter: Payer: Self-pay | Admitting: Internal Medicine

## 2013-03-24 VITALS — BP 137/89 | HR 78 | Temp 97.0°F | Ht 69.0 in | Wt 207.7 lb

## 2013-03-24 DIAGNOSIS — R51 Headache: Secondary | ICD-10-CM

## 2013-03-24 DIAGNOSIS — F329 Major depressive disorder, single episode, unspecified: Secondary | ICD-10-CM

## 2013-03-24 DIAGNOSIS — I10 Essential (primary) hypertension: Secondary | ICD-10-CM

## 2013-03-24 DIAGNOSIS — F3289 Other specified depressive episodes: Secondary | ICD-10-CM

## 2013-03-24 DIAGNOSIS — IMO0002 Reserved for concepts with insufficient information to code with codable children: Secondary | ICD-10-CM

## 2013-03-24 DIAGNOSIS — M1710 Unilateral primary osteoarthritis, unspecified knee: Secondary | ICD-10-CM

## 2013-03-24 DIAGNOSIS — M170 Bilateral primary osteoarthritis of knee: Secondary | ICD-10-CM

## 2013-03-24 MED ORDER — HYDROCODONE-ACETAMINOPHEN 7.5-325 MG PO TABS: 1.0000 | ORAL_TABLET | Freq: Four times a day (QID) | ORAL | Status: DC | PRN

## 2013-03-24 NOTE — Progress Notes (Signed)
Subjective:   Patient ID: Greg Flowers male   DOB: 21-Jun-1955 57 y.o.   MRN: [id number]  HPI: [name] is a 57 y.o. M w/ PMHx of HTN, Hep C (failed ribavirin therapy in 2000), Depression, and OA, presents to clinic today for follow up. He has no significant complaints today, just with similar chronic complaints as before. Still with bilateral knee pain and neck pain/headaches. He has been taking NSAIDS mostly for pain control and has not refilled his Norco prescription in some time. He did have a recent dental procedure for which he was given 20 Vicodin, which he mentioned during his visit. He claims his medical issues are very well controlled at this time. 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 He also follows w/ a psychiatrist, Dr. Jennelle Flowers, who he saw last week, and claims changed his Prozac dosage from 40 mg to 60 mg.  No other issues at this time.  Past Medical History  Diagnosis Date  . Hepatitis C     Has occasional visits with Dr. Randa Flowers GI, failed RX in 2000.  Liver Biopsy 9/06: Minimally active hepatitis consistent with hepatitis C, minimal necroinflamtory activity grade 1, no incrased fibrosis stage 0.   . Hypertension   . Renal stone   . Cervical spondylosis 2004    sp surgery by Dr. Danielle Flowers  . Thalassemia     HGB 9/09 14.4 wtih MCV 72.6  . Herpes labialis   . Depression   . Calculus, kidney 2000    Sees Dr. Isabel Flowers, urology.   Marland Kitchen Headache(784.0)     Chronic, on vicodin 5 TID for this.    Current Outpatient Prescriptions  Medication Sig Dispense Refill  . amLODipine (NORVASC) 10 MG tablet TAKE 1 TABLET BY MOUTH EVERY DAY  30 tablet  6  . amLODipine (NORVASC) 10 MG tablet TAKE 1 TABLET BY MOUTH EVERY DAY  30 tablet  6  . atenolol (TENORMIN) 50 MG tablet TAKE 1 TABLET (50 MG TOTAL) BY MOUTH DAILY.  31 tablet  9  . benazepril (LOTENSIN) 40 MG tablet TAKE 2 TABLETS BY MOUTH DAILY  60  tablet  6  . benazepril (LOTENSIN) 40 MG tablet TAKE 2 TABLETS BY MOUTH DAILY  60 tablet  6  . diazepam (VALIUM) 5 MG tablet TAKE 1 TABLET BY MOUTH TWICE A DAY  62 tablet  2  . FLUoxetine (PROZAC) 40 MG capsule Use as directed 1 capsule in the mouth or throat Daily.      . hydrochlorothiazide (HYDRODIURIL) 25 MG tablet TAKE 1 TABLET (25 MG TOTAL) BY MOUTH DAILY.  31 tablet  10  . HYDROcodone-acetaminophen (NORCO) 7.5-325 MG per tablet Take 1 tablet by mouth every 6 (six) hours as needed for pain.  90 tablet  3  . OLANZapine (ZYPREXA) 5 MG tablet Use as directed 1 tablet in the mouth or throat Daily.      Marland Kitchen omeprazole (PRILOSEC) 20 MG capsule Take 1 capsule (20 mg total) by mouth daily.  30 capsule  5  . Tamsulosin HCl (FLOMAX) 0.4 MG CAPS Take 0.4 mg by mouth at bedtime.         No current facility-administered medications for this visit.   No family history on file. History   Social History  . Marital Status: Married    Spouse Name: N/A    Number of Children: N/A  . Years  of Education: N/A   Social History Main Topics  . Smoking status: Former Games developer  . Smokeless tobacco: None     Comment: quit in the early 1980's  . Alcohol Use: Yes     Comment: Occasionally  . Drug Use: No  . Sexual Activity: None   Other Topics Concern  . 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    Review of Systems: General: Denies fever, chills, diaphoresis, appetite change and fatigue.  Respiratory: Denies SOB, DOE, cough, chest tightness, and wheezing.   Cardiovascular: Denies chest pain, palpitations and leg swelling.  Gastrointestinal: Denies nausea, vomiting, abdominal pain, diarrhea, constipation, blood in stool and abdominal distention.  Genitourinary: Denies dysuria, urgency, frequency, hematuria, flank pain and difficulty urinating.  Musculoskeletal: Positive for intermittent knee pain. Denies myalgias, back pain, joint swelling, and gait problem.  Skin:  Denies pallor, rash and wounds.  Neurological: Positive for headaches. Denies dizziness, seizures, syncope, weakness, lightheadedness, numbness.  Psychiatric/Behavioral: Denies mood changes, confusion, nervousness, sleep disturbance and agitation.  Objective:  Physical Exam: Filed Vitals:   03/24/13 1333  BP: 137/89  Pulse: 78  Temp: 97 F (36.1 C)  TempSrc: Oral  Height: 5\' 9"  (1.753 m)  Weight: 94.212 kg (207 lb 11.2 oz)  SpO2: 96%  General: Vital signs reviewed.  Patient is a well-developed and well-nourished, in no acute distress and cooperative with exam. Alert and oriented x3.  Head: Normocephalic and atraumatic. Eyes: PERRL, EOMI, conjunctivae normal, No scleral icterus.  Neck: Supple, trachea midline, normal ROM, No JVD, masses, thyromegaly, or carotid bruit present.  Cardiovascular: RRR, S1 normal, S2 normal, no murmurs, gallops, or rubs. Pulmonary/Chest: Normal respiratory effort, CTAB, no wheezes, rales, or rhonchi. Abdominal: Soft. Non-tender, non-distended, bowel sounds are normal, no masses, organomegaly, or guarding present.  Musculoskeletal: No joint deformities, erythema, or stiffness, ROM full and no nontender. Extremities: No swelling or edema,  pulses symmetric and intact bilaterally. No cyanosis or clubbing. Neurological: A&O x3, Strength is normal and symmetric bilaterally, cranial nerve II-XII are grossly intact, no focal motor deficit, sensory intact to light touch bilaterally.  Skin: Warm, dry and intact. No rashes or erythema. Psychiatric: Normal mood and affect. speech and behavior is normal. Cognition and memory are normal.   Assessment & Plan:    Please see problem-based assessment and plan.

## 2013-03-24 NOTE — Assessment & Plan Note (Signed)
Patient w/ chronic knee pain, worse w/ bending and lifting at his job, works at the post [employer]. Patient hasn't been taking Norco recently as he ran out some time ago. Narcotics database showed only one other prescriber recently, a dentist who performed a mild oral surgery recently, which the patient admitted to.  -Will refill Norco 7.5-325 #90.  -Obtained urine drug screen

## 2013-03-24 NOTE — Assessment & Plan Note (Addendum)
Follows w/ a psychiatrist Dr. Jennelle Human who prescribes Olanzapine, Prozac, and Ambien. Recently changed prescription of Prozac from 40 mg to 60 mg, according to the patient.  -No issues at this time. Claims he is not having symptoms of depression.

## 2013-03-24 NOTE — Assessment & Plan Note (Signed)
Well controlled today, 137/89. Continue Norvasc, Atenolol, Lotensin, HCTZ.

## 2013-03-24 NOTE — Patient Instructions (Addendum)
1. Please schedule a follow up appointment for 2-3 months.  Please follow up w/ Dr. Jennelle Human as needed.  2. Please take all medications as prescribed.   3. If you have worsening of your symptoms or new symptoms arise, please call the clinic ([phone]), or go to the ER immediately if symptoms are severe.  You have done a great job in taking all your medications. I appreciate it very much. Please continue doing that.

## 2013-03-24 NOTE — Assessment & Plan Note (Signed)
Patient complaining of chronic headaches, but says they are well controlled with NSAIDS. Also complains of associated neck pain, described as a muscle ache at the base of the skull. Patient has had a cervical fusion in the past. Pain also controlled with NSAIDS.  -If pain worsens, will consider imaging given history of surgery w/ hardware.

## 2013-03-25 LAB — PRESCRIPTION ABUSE MONITORING 15P, URINE
Amphetamine/Meth: NEGATIVE ng/mL
Barbiturate Screen, Urine: NEGATIVE ng/mL
Buprenorphine, Urine: NEGATIVE ng/mL
Cannabinoid Scrn, Ur: NEGATIVE ng/mL
Carisoprodol, Urine: NEGATIVE ng/mL
Creatinine, Urine: 47.59 mg/dL (ref >20.0)
Fentanyl, Ur: NEGATIVE ng/mL
Meperidine, Ur: NEGATIVE ng/mL
Methadone Screen, Urine: NEGATIVE ng/mL
Opiate Screen, Urine: NEGATIVE ng/mL
Oxycodone Screen, Ur: NEGATIVE ng/mL
Propoxyphene: NEGATIVE ng/mL
Tramadol Scrn, Ur: NEGATIVE ng/mL
Zolpidem, Urine: NEGATIVE ng/mL

## 2013-03-25 NOTE — Progress Notes (Signed)
I saw and evaluated the patient.  I personally confirmed the key portions of Dr. Jones' history and exam and reviewed pertinent patient test results.  The assessment, diagnosis, and plan were formulated together and I agree with the documentation in the resident's note. 

## 2013-03-26 LAB — BENZODIAZEPINES (GC/LC/MS), URINE
Alprazolam (GC/LC/MS), ur confirm: NEGATIVE ng/mL
Alprazolam metabolite (GC/LC/MS), ur confirm: NEGATIVE ng/mL
Clonazepam metabolite (GC/LC/MS), ur confirm: NEGATIVE ng/mL
Diazepam (GC/LC/MS), ur confirm: NEGATIVE ng/mL
Estazolam (GC/LC/MS), ur confirm: NEGATIVE ng/mL
Flunitrazepam metabolite (GC/LC/MS), ur confirm: NEGATIVE ng/mL
Flurazepam metabolite (GC/LC/MS), ur confirm: NEGATIVE ng/mL
Lorazepam (GC/LC/MS), ur confirm: NEGATIVE ng/mL
Midazolam (GC/LC/MS), ur confirm: NEGATIVE ng/mL
Nordiazepam (GC/LC/MS), ur confirm: 132 ng/mL
Oxazepam (GC/LC/MS), ur confirm: 245 ng/mL
Temazepam (GC/LC/MS), ur confirm: 423 ng/mL
Triazolam metabolite (GC/LC/MS), ur confirm: NEGATIVE ng/mL

## 2013-03-26 LAB — COCAINE METABOLITE (GC/LC/MS), URINE: Benzoylecgonine GC/MS Conf: 210 ng/mL

## 2013-05-14 ENCOUNTER — Other Ambulatory Visit: Payer: Federal, State, Local not specified - PPO

## 2013-05-14 DIAGNOSIS — Z1211 Encounter for screening for malignant neoplasm of colon: Secondary | ICD-10-CM

## 2013-05-14 LAB — POC HEMOCCULT BLD/STL (HOME/3-CARD/SCREEN)
Card #2 Fecal Occult Blod, POC: NEGATIVE
Card #3 Fecal Occult Blood, POC: NEGATIVE
Fecal Occult Blood, POC: NEGATIVE

## 2013-06-16 ENCOUNTER — Encounter: Payer: Federal, State, Local not specified - PPO | Admitting: Internal Medicine

## 2013-06-25 ENCOUNTER — Other Ambulatory Visit: Payer: Self-pay | Admitting: Internal Medicine

## 2013-06-26 NOTE — Telephone Encounter (Signed)
Called to pharm 

## 2013-06-26 NOTE — Telephone Encounter (Signed)
Giving one month's supply. Patient needs to come in and discuss the use of this medication with his doctor.

## 2013-08-05 ENCOUNTER — Other Ambulatory Visit: Payer: Self-pay | Admitting: Internal Medicine

## 2013-08-07 NOTE — Telephone Encounter (Signed)
Rx called in 

## 2013-08-24 ENCOUNTER — Ambulatory Visit (INDEPENDENT_AMBULATORY_CARE_PROVIDER_SITE_OTHER): Payer: Federal, State, Local not specified - PPO | Admitting: Internal Medicine

## 2013-08-24 ENCOUNTER — Encounter: Payer: Self-pay | Admitting: Internal Medicine

## 2013-08-24 VITALS — BP 149/98 | HR 103 | Temp 96.8°F | Ht 69.0 in | Wt 214.0 lb

## 2013-08-24 DIAGNOSIS — IMO0002 Reserved for concepts with insufficient information to code with codable children: Secondary | ICD-10-CM

## 2013-08-24 DIAGNOSIS — B1920 Unspecified viral hepatitis C without hepatic coma: Secondary | ICD-10-CM

## 2013-08-24 DIAGNOSIS — M1710 Unilateral primary osteoarthritis, unspecified knee: Secondary | ICD-10-CM

## 2013-08-24 DIAGNOSIS — I10 Essential (primary) hypertension: Secondary | ICD-10-CM

## 2013-08-24 DIAGNOSIS — M170 Bilateral primary osteoarthritis of knee: Secondary | ICD-10-CM

## 2013-08-24 DIAGNOSIS — B1710 Acute hepatitis C without hepatic coma: Secondary | ICD-10-CM

## 2013-08-24 DIAGNOSIS — Z0000 Encounter for general adult medical examination without abnormal findings: Secondary | ICD-10-CM

## 2013-08-24 MED ORDER — DIAZEPAM 5 MG PO TABS: 5.0000 mg | ORAL_TABLET | Freq: Every day | ORAL | Status: DC

## 2013-08-24 NOTE — Progress Notes (Signed)
Patient ID: Greg Flowers, male   DOB: 21-Sep-1955, 58 y.o.   MRN: [id number]  Subjective:   Patient ID: Greg Flowers male   DOB: 21-Sep-1955 58 y.o.   MRN: [id number]  HPI: GregAkhilesh L Reuel Flowers is a 58 y.o. M w/ PMHx of HTN, Hep C (failed ribavirin therapy in 2000), Depression, and OA, presents to clinic today for follow up. Today, Greg Flowers continues to complain of bilateral knee, back, and neck pain, for which he has taken hydrocodone-acetaminophen 7.5-325 #90/month in the past for th, but has been out of pain medication for some time. During his last visit in 03/2013, a repeat UDS was performed as per his pain contract, for which he tested positive for cocaine metabolites. The patient continues to deny using cocaine. Otherwise, he claims he is having only minor complaints. He continues to have stress at work and at home. The patient continues to see Dr. Jennelle Humanottle for depression and anxiety and says that this is relatively well managed at this time.  Past Medical History  Diagnosis Date  . Hepatitis C     [facility], failed RX in 2000.  Liver Biopsy 9/06: Minimally active hepatitis consistent with hepatitis C, minimal necroinflamtory activity grade 1, no incrased fibrosis stage 0.   . Hypertension   . Renal stone   . Cervical spondylosis 2004    sp surgery by Dr. Danielle DessElsner  . Thalassemia     HGB 9/09 14.4 wtih MCV 72.6  . Herpes labialis   . Depression   . Calculus, kidney 2000    Sees Dr. Isabel CapriceGrapey, urology.   Marland Kitchen. Headache(784.0)     Chronic, on vicodin 5 TID for this.    Current Outpatient Prescriptions  Medication Sig Dispense Refill  . amLODipine (NORVASC) 10 MG tablet TAKE 1 TABLET BY MOUTH EVERY DAY  30 tablet  6  . atenolol (TENORMIN) 50 MG tablet TAKE 1 TABLET (50 MG TOTAL) BY MOUTH DAILY.  31 tablet  9  . benazepril (LOTENSIN) 40 MG tablet TAKE 2 TABLETS BY MOUTH DAILY  60 tablet  6  . diazepam (VALIUM) 5 MG tablet TAKE 1 TABLET BY MOUTH EVERY 12 HOURS AS  NEEDED ANXIETY  60 tablet  0  . FLUoxetine (PROZAC) 40 MG capsule Use as directed 1 capsule in the mouth or throat Daily.      . hydrochlorothiazide (HYDRODIURIL) 25 MG tablet TAKE 1 TABLET (25 MG TOTAL) BY MOUTH DAILY.  31 tablet  10  . HYDROcodone-acetaminophen (NORCO) 7.5-325 MG per tablet Take 1 tablet by mouth every 6 (six) hours as needed.  90 tablet  0  . OLANZapine (ZYPREXA) 5 MG tablet Use as directed 1 tablet in the mouth or throat Daily.      Marland Kitchen. omeprazole (PRILOSEC) 20 MG capsule Take 1 capsule (20 mg total) by mouth daily.  30 capsule  5  . Tamsulosin HCl (FLOMAX) 0.4 MG CAPS Take 0.4 mg by mouth at bedtime.         No current facility-administered medications for this visit.   No family history on file. History   Social History  . Marital Status: Married    Spouse Name: N/A    Number of Children: N/A  . Years of Education: N/A   Social History Main Topics  . Smoking status: Former Games developermoker  . Smokeless tobacco: None     Comment: quit in the early 1980's  . Alcohol Use: Yes     Comment:  Occasionally  . Drug Use: No  . Sexual Activity: None   Other Topics Concern  . None   Social History Narrative   Works at [employer] office, Married, Exercises.    Review of Systems: General: Denies fever, chills, diaphoresis, appetite change and fatigue.  Respiratory: Denies SOB, DOE, cough, chest tightness, and wheezing.   Cardiovascular: Denies chest pain, palpitations and leg swelling.  Gastrointestinal: Denies nausea, vomiting, abdominal pain, diarrhea, constipation, blood in stool and abdominal distention.  Genitourinary: Denies dysuria, urgency, frequency, hematuria, flank pain and difficulty urinating.  Musculoskeletal: Positive for intermittent knee pain, neck pain, and back pain. Denies myalgias, back pain, joint swelling, and gait problem.  Skin: Denies pallor, rash and wounds.  Neurological: Positive for headaches. Denies dizziness, seizures, syncope, weakness,  lightheadedness, numbness.  Psychiatric/Behavioral: Denies mood changes, confusion, nervousness, sleep disturbance and agitation.  Objective:  Physical Exam: Filed Vitals:   08/24/13 1400  BP: 149/98  Pulse: 103  Temp: 96.8 F (36 C)  TempSrc: Oral  Height: 5\' 9"  (1.753 m)  Weight: 214 lb (97.07 kg)  SpO2: 96%  General: Vital signs reviewed.  Patient is a well-developed and well-nourished, in no acute distress and cooperative with exam. Alert and oriented x3.  Head: Normocephalic and atraumatic. Eyes: PERRL, EOMI, conjunctivae normal, No scleral icterus.  Neck: Supple, trachea midline, normal ROM, No JVD, masses, thyromegaly, or carotid bruit present.  Cardiovascular: RRR, S1 normal, S2 normal, no murmurs, gallops, or rubs. Pulmonary/Chest: Normal respiratory effort, CTAB, no wheezes, rales, or rhonchi. Abdominal: Soft. Non-tender, non-distended, bowel sounds are normal, no masses, organomegaly, or guarding present.  Musculoskeletal: No joint deformities, erythema, or stiffness, ROM full and no nontender. Extremities: No swelling or edema,  pulses symmetric and intact bilaterally. No cyanosis or clubbing. Neurological: A&O x3, Strength is normal and symmetric bilaterally, cranial nerve II-XII are grossly intact, no focal motor deficit, sensory intact to light touch bilaterally.  Skin: Warm, dry and intact. No rashes or erythema. Psychiatric: Normal mood and affect. speech and behavior is normal. Cognition and memory are normal.   Assessment & Plan:    Please see problem-based assessment and plan.

## 2013-08-24 NOTE — Patient Instructions (Signed)
1. Please schedule a follow up appointment for 1-2 months.  2. Please take all medications as prescribed.   3. If you have worsening of your symptoms or new symptoms arise, please call the clinic [phone]), or go to the ER immediately if symptoms are severe.

## 2013-08-24 NOTE — Assessment & Plan Note (Signed)
Referral for colonoscopy sent (last colonoscopy 2005).

## 2013-08-24 NOTE — Assessment & Plan Note (Addendum)
Patient failed Ribavirin therapy in 2000. Most recent CMP in 12/2012 showed mildly elevated LFT's. Most recent abdominal US w/ no signs of cirrhosis or steatohepatitis. -Given referral to [facility] to discuss further options for Hep C treatment.

## 2013-08-24 NOTE — Assessment & Plan Note (Addendum)
Blood pressure mildly elevated today, 149/98. Continues to take Atenolol 50 mg po qd, HCTZ 25 mg po qd, Amlodipine 10 mg po qd, Benazepril 40 mg po bid. -Given blood pressure log to monitor BP at home -Will make changes to BP meds if still elevated at next visit; 1-2 months.

## 2013-08-24 NOTE — Assessment & Plan Note (Addendum)
Patient previously contracted for hydrocodone-acetaminophen 7.5-325 #90/month. During last visit (03/2013), UDS was repeated, showed to be positive for cocaine metabolites. Discussed at length with patient who continued to deny cocaine use. -Given risk of significant drug interactions and failure to follow guidelines outlined by pain contract, cannot provide patient w/ further narcotic pain medication at this time.  -Additionally, had patient give another UDS. If still positive for cocaine, will completely discontinue Valium prescription as well (decreased to 5 mg daily from 5 mg bid).

## 2013-08-25 LAB — PRESCRIPTION ABUSE MONITORING 15P, URINE
Amphetamine/Meth: NEGATIVE ng/mL
Barbiturate Screen, Urine: NEGATIVE ng/mL
Buprenorphine, Urine: NEGATIVE ng/mL
Cannabinoid Scrn, Ur: NEGATIVE ng/mL
Carisoprodol, Urine: NEGATIVE ng/mL
Cocaine Metabolites: NEGATIVE ng/mL
Creatinine, Urine: 301.55 mg/dL (ref >20.0)
Fentanyl, Ur: NEGATIVE ng/mL
Meperidine, Ur: NEGATIVE ng/mL
Methadone Screen, Urine: NEGATIVE ng/mL
Opiate Screen, Urine: NEGATIVE ng/mL
Oxycodone Screen, Ur: NEGATIVE ng/mL
Propoxyphene: NEGATIVE ng/mL
Tramadol Scrn, Ur: NEGATIVE ng/mL

## 2013-08-26 LAB — BENZODIAZEPINES (GC/LC/MS), URINE
Alprazolam metabolite (GC/LC/MS), ur confirm: NEGATIVE ng/mL (ref <25)
Clonazepam metabolite (GC/LC/MS), ur confirm: NEGATIVE ng/mL (ref <25)
Flurazepam metabolite (GC/LC/MS), ur confirm: NEGATIVE ng/mL (ref <50)
Lorazepam (GC/LC/MS), ur confirm: NEGATIVE ng/mL (ref <50)
Midazolam (GC/LC/MS), ur confirm: NEGATIVE ng/mL (ref <50)
Nordiazepam (GC/LC/MS), ur confirm: 808 ng/mL — ABNORMAL HIGH (ref <50)
Oxazepam (GC/LC/MS), ur confirm: 1700 ng/mL — ABNORMAL HIGH (ref <50)
Temazepam (GC/LC/MS), ur confirm: 3450 ng/mL — ABNORMAL HIGH (ref <50)
Triazolam metabolite (GC/LC/MS), ur confirm: NEGATIVE ng/mL (ref <50)

## 2013-08-27 LAB — ZOLPIDEM (LC/MS-MS), URINE
Zolpidem (GC/LC/MS), Ur confirm: 36 ng/mL
Zolpidem metabolite (GC/LC/MS) Ur, confirm: >2500 ng/mL

## 2013-09-03 NOTE — Progress Notes (Signed)
Case discussed with Dr. Jones at the time of the visit.  We reviewed the resident's history and exam and pertinent patient test results.  I agree with the assessment, diagnosis, and plan of care documented in the resident's note. 

## 2013-09-09 ENCOUNTER — Other Ambulatory Visit: Payer: Federal, State, Local not specified - PPO

## 2013-09-09 DIAGNOSIS — B182 Chronic viral hepatitis C: Secondary | ICD-10-CM

## 2013-09-11 LAB — HCV RNA QUANT RFLX ULTRA OR GENOTYP
HCV Quantitative Log: 6.5 {Log} — ABNORMAL HIGH (ref <1.18)
HCV Quantitative: 3131794 IU/mL — ABNORMAL HIGH (ref <15)

## 2013-09-14 LAB — HEPATITIS C GENOTYPE

## 2013-09-30 ENCOUNTER — Other Ambulatory Visit: Payer: Self-pay | Admitting: *Deleted

## 2013-09-30 MED ORDER — DIAZEPAM 5 MG PO TABS: 5.0000 mg | ORAL_TABLET | Freq: Every day | ORAL | Status: DC

## 2013-10-01 NOTE — Telephone Encounter (Signed)
Rx called in to pharmacy. 

## 2013-10-29 ENCOUNTER — Other Ambulatory Visit: Payer: Self-pay | Admitting: Internal Medicine

## 2013-11-18 ENCOUNTER — Other Ambulatory Visit: Payer: Self-pay | Admitting: *Deleted

## 2013-11-20 MED ORDER — OMEPRAZOLE 20 MG PO CPDR: 20.0000 mg | DELAYED_RELEASE_CAPSULE | Freq: Every day | ORAL | Status: DC

## 2013-11-20 MED ORDER — DIAZEPAM 5 MG PO TABS: 5.0000 mg | ORAL_TABLET | Freq: Every day | ORAL | Status: DC

## 2013-11-23 ENCOUNTER — Encounter: Payer: Self-pay | Admitting: Internal Medicine

## 2013-11-23 ENCOUNTER — Ambulatory Visit (INDEPENDENT_AMBULATORY_CARE_PROVIDER_SITE_OTHER): Payer: Federal, State, Local not specified - PPO | Admitting: Internal Medicine

## 2013-11-23 VITALS — BP 149/91 | HR 73 | Temp 97.6°F | Ht 70.0 in | Wt 210.0 lb

## 2013-11-23 DIAGNOSIS — B182 Chronic viral hepatitis C: Secondary | ICD-10-CM

## 2013-11-23 LAB — COMPLETE METABOLIC PANEL WITH GFR
ALT: 42 U/L (ref 0–53)
AST: 33 U/L (ref 0–37)
Albumin: 4 g/dL (ref 3.5–5.2)
Alkaline Phosphatase: 73 U/L (ref 39–117)
BUN: 17 mg/dL (ref 6–23)
CO2: 28 mEq/L (ref 19–32)
Calcium: 9.8 mg/dL (ref 8.4–10.5)
Chloride: 101 mEq/L (ref 96–112)
Creat: 1.22 mg/dL (ref 0.50–1.35)
GFR, Est African American: 75 mL/min
GFR, Est Non African American: 64 mL/min
Glucose, Bld: 88 mg/dL (ref 70–99)
Potassium: 3.9 mEq/L (ref 3.5–5.3)
Sodium: 139 mEq/L (ref 135–145)
Total Bilirubin: 0.3 mg/dL (ref 0.2–1.2)
Total Protein: 6.8 g/dL (ref 6.0–8.3)

## 2013-11-23 LAB — HEPATITIS B SURFACE ANTIGEN: Hepatitis B Surface Ag: NEGATIVE

## 2013-11-23 LAB — CBC WITH DIFFERENTIAL/PLATELET
Basophils Absolute: 0.1 10*3/uL (ref 0.0–0.1)
Basophils Relative: 1 % (ref 0–1)
Eosinophils Absolute: 0.5 10*3/uL (ref 0.0–0.7)
Eosinophils Relative: 6 % — ABNORMAL HIGH (ref 0–5)
HCT: 41.5 % (ref 39.0–52.0)
Hemoglobin: 13.9 g/dL (ref 13.0–17.0)
Lymphocytes Relative: 43 % (ref 12–46)
Lymphs Abs: 3.4 10*3/uL (ref 0.7–4.0)
MCH: 22.6 pg — ABNORMAL LOW (ref 26.0–34.0)
MCHC: 33.5 g/dL (ref 30.0–36.0)
MCV: 67.5 fL — ABNORMAL LOW (ref 78.0–100.0)
Monocytes Absolute: 0.9 10*3/uL (ref 0.1–1.0)
Monocytes Relative: 11 % (ref 3–12)
Neutro Abs: 3.1 10*3/uL (ref 1.7–7.7)
Neutrophils Relative %: 39 % — ABNORMAL LOW (ref 43–77)
Platelets: 257 10*3/uL (ref 150–400)
RBC: 6.15 MIL/uL — ABNORMAL HIGH (ref 4.22–5.81)
RDW: 15.7 % — ABNORMAL HIGH (ref 11.5–15.5)
WBC: 7.9 10*3/uL (ref 4.0–10.5)

## 2013-11-23 LAB — PROTIME-INR
INR: 0.98 (ref <1.50)
Prothrombin Time: 13 seconds (ref 11.6–15.2)

## 2013-11-23 LAB — HEPATITIS B SURFACE ANTIBODY,QUALITATIVE: Hep B S Ab: NEGATIVE

## 2013-11-23 LAB — HEPATITIS B CORE ANTIBODY, TOTAL: Hep B Core Total Ab: NONREACTIVE

## 2013-11-23 LAB — IRON: Iron: 72 ug/dL (ref 42–165)

## 2013-11-23 LAB — HEPATITIS A ANTIBODY, TOTAL: Hep A Total Ab: REACTIVE — AB

## 2013-11-23 LAB — AMMONIA: Ammonia: 51 umol/L (ref 16–53)

## 2013-11-23 LAB — HIV ANTIBODY (ROUTINE TESTING W REFLEX): HIV 1&2 Ab, 4th Generation: NONREACTIVE

## 2013-11-23 NOTE — Progress Notes (Signed)
+Greg Flowers is a 58 y.o. male who presents for initial evaluation and management of a positive Hepatitis C antibody test.  Patient tested positive in the 1990s. Test was performed as part of an evaluation of known exposure to hepatitis C. Hepatitis C risk factors present are: IV drug abuse (details: in the 1980s, no use since). Patient denies accidental needle stick, history of blood transfusion, history of clotting factor transfusion, multiple sexual partners, renal dialysis, sexual contact with person with liver disease, tattoos. Patient has had other studies performed. Results: hepatitis C RNA by PCR, result: positive. Patient has had prior treatment for Hepatitis C. Patient does not have a past history of liver disease. Patient does not have a family history of liver disease.   HPI: He previously has been treated by [facility] Randa Evens of [facility] for hepatitis C with 3 times per week interferon and ribavirin in about 2000.  He completed 48 weeks but did not achieve SVR.  Last biopsy done 2006 and was F0/A1.  He currently denies any drug use for many years, though had a positive cocaine screen in [address]147 that he cannot explain how it became positive.    Patient does not have documented immunity to Hepatitis A. Patient does not have documented immunity to Hepatitis B.     Review of Systems A comprehensive review of systems was negative.   Past Medical History  Diagnosis Date  . Hepatitis C     Has occasional visits with Dr. Randa Evens [facility], failed RX in 2000.  Liver Biopsy 9/06: Minimally active hepatitis consistent with hepatitis C, minimal necroinflamtory activity grade 1, no incrased fibrosis stage 0.   . Hypertension   . Renal stone   . Cervical spondylosis 2004    sp surgery by Dr. Danielle Dess  . Thalassemia     HGB 9/09 14.4 wtih MCV 72.6  . Herpes labialis   . Depression   . Calculus, kidney 2000    Sees Dr. Isabel Caprice, urology.   Marland Kitchen Headache(784.0)     Chronic, on vicodin 5 TID for this.      History  Substance Use Topics  . Smoking status: Former Games developer  . Smokeless tobacco: Never Used     Comment: quit in the early 1980's  . Alcohol Use: Yes     Comment: Occasionally    No family history on file. No liver disease, no liver cancer   Objective:   Filed Vitals:   11/23/13 0939  BP: 149/91  Pulse: 73  Temp: 97.6 F (36.4 C)   in no apparent distress and alert HEENT: anicteric Cor RRR and No murmurs clear Bowel sounds are normal, liver is not enlarged, spleen is not enlarged peripheral pulses normal, no pedal edema, no clubbing or cyanosis negative for - jaundice, spider hemangioma, telangiectasia, palmar erythema, ecchymosis and atrophy  Laboratory Genotype:  Lab Results  Component Value Date   HCVGENOTYPE 1b 09/09/2013   HCV viral load:  Lab Results  Component Value Date   HCVQUANT 8295621* 09/09/2013   Lab Results  Component Value Date   WBC 7.5 01/09/2008   HGB 14.4 01/09/2008   HCT 43.5 01/09/2008   MCV 72.6* 01/09/2008   PLT 191 01/09/2008    Lab Results  Component Value Date   CREATININE 1.19 12/25/2012   BUN 11 12/25/2012   NA 140 12/25/2012   K 3.7 12/25/2012   CL 101 12/25/2012   CO2 34* 12/25/2012    Lab Results  Component Value Date  ALT 61* 12/25/2012   AST 47* 12/25/2012   ALKPHOS 59 12/25/2012   BILITOT 0.4 12/25/2012      Assessment: Hepatitis C genotype 1b  Plan: 1) Patient counseled extensively on limiting acetaminophen to no more than 2 grams daily, avoidance of alcohol. 2) Transmission discussed with patient including sexual transmission, sharing razors and toothbrush.   3) Will need referral to gastroenterology: No. already sees [facility].  4) Will need referral for substance abuse counseling: Yes.  with his history of positive cocaine screen, he will need to be evaluated 5) Will prescribe Harvoni for 12 weeks once work up complete 6) Hepatitis A vaccine No., [facility] 7) Hepatitis B vaccine No. 8)  Pneumovax vaccine No. unless cirrhosis.   RTC 4 weeks for results and prescribe if complete.

## 2013-11-23 NOTE — Telephone Encounter (Signed)
Rx called in 

## 2013-11-24 LAB — DRUG SCREEN, URINE
Amphetamine Screen, Ur: NEGATIVE
Barbiturate Quant, Ur: NEGATIVE
Benzodiazepines.: POSITIVE — AB
Cocaine Metabolites: NEGATIVE
Creatinine,U: 116.45 mg/dL
Marijuana Metabolite: NEGATIVE
Methadone: NEGATIVE
Opiates: NEGATIVE
Phencyclidine (PCP): NEGATIVE
Propoxyphene: NEGATIVE

## 2013-11-25 LAB — ANA: Anti Nuclear Antibody(ANA): NEGATIVE

## 2013-12-02 ENCOUNTER — Ambulatory Visit (HOSPITAL_COMMUNITY)
Admission: RE | Admit: 2013-12-02 | Discharge: 2013-12-02 | Disposition: A | Discharge status: Home / Self Care | Payer: Federal, State, Local not specified - PPO | Source: Ambulatory Visit | Attending: Internal Medicine | Admitting: Internal Medicine

## 2013-12-02 DIAGNOSIS — B182 Chronic viral hepatitis C: Secondary | ICD-10-CM | POA: Insufficient documentation

## 2013-12-22 ENCOUNTER — Ambulatory Visit: Payer: Federal, State, Local not specified - PPO | Admitting: Internal Medicine

## 2014-01-11 ENCOUNTER — Ambulatory Visit: Payer: Federal, State, Local not specified - PPO | Admitting: Internal Medicine

## 2014-01-22 ENCOUNTER — Other Ambulatory Visit: Payer: Self-pay | Admitting: Internal Medicine

## 2014-01-26 NOTE — Telephone Encounter (Signed)
Valium 5 mg rx called to [facility].

## 2014-01-28 ENCOUNTER — Other Ambulatory Visit: Payer: Self-pay | Admitting: Internal Medicine

## 2014-02-02 ENCOUNTER — Ambulatory Visit: Payer: Federal, State, Local not specified - PPO | Admitting: Internal Medicine

## 2014-03-24 ENCOUNTER — Encounter: Payer: Self-pay | Admitting: Internal Medicine

## 2014-04-01 ENCOUNTER — Other Ambulatory Visit: Payer: Self-pay | Admitting: Internal Medicine

## 2014-05-17 ENCOUNTER — Other Ambulatory Visit: Payer: Self-pay | Admitting: *Deleted

## 2014-05-18 MED ORDER — DIAZEPAM 5 MG PO TABS: 5.0000 mg | ORAL_TABLET | Freq: Every day | ORAL | Status: DC | PRN

## 2014-05-18 NOTE — Telephone Encounter (Signed)
Rx called in to pharmacy. 

## 2014-06-21 ENCOUNTER — Other Ambulatory Visit: Payer: Self-pay | Admitting: Internal Medicine

## 2014-07-24 ENCOUNTER — Other Ambulatory Visit: Payer: Self-pay | Admitting: Internal Medicine

## 2014-10-01 ENCOUNTER — Other Ambulatory Visit: Payer: Self-pay | Admitting: Internal Medicine

## 2014-10-08 ENCOUNTER — Other Ambulatory Visit: Payer: Self-pay | Admitting: Internal Medicine

## 2014-12-02 ENCOUNTER — Other Ambulatory Visit: Payer: Self-pay | Admitting: Internal Medicine

## 2015-01-11 ENCOUNTER — Encounter: Payer: Self-pay | Admitting: Internal Medicine

## 2015-01-11 ENCOUNTER — Encounter: Payer: Federal, State, Local not specified - PPO | Admitting: Internal Medicine

## 2015-02-18 ENCOUNTER — Other Ambulatory Visit: Payer: Self-pay | Admitting: Internal Medicine

## 2015-04-17 HISTORY — PX: BACK SURGERY: SHX140

## 2015-05-04 ENCOUNTER — Other Ambulatory Visit: Payer: Self-pay | Admitting: Internal Medicine

## 2015-05-04 NOTE — Telephone Encounter (Signed)
No OV since 2015

## 2015-05-06 ENCOUNTER — Other Ambulatory Visit: Payer: Self-pay | Admitting: *Deleted

## 2015-05-06 NOTE — Telephone Encounter (Signed)
Pharmacy request a 90 day supply

## 2015-05-09 ENCOUNTER — Other Ambulatory Visit: Payer: Self-pay | Admitting: *Deleted

## 2015-05-09 MED ORDER — AMLODIPINE BESYLATE 10 MG PO TABS: 10.0000 mg | ORAL_TABLET | Freq: Every day | ORAL | Status: DC

## 2015-05-09 MED ORDER — BENAZEPRIL HCL 40 MG PO TABS: 80.0000 mg | ORAL_TABLET | Freq: Every day | ORAL | Status: DC

## 2015-05-10 ENCOUNTER — Encounter: Payer: Self-pay | Admitting: Internal Medicine

## 2015-07-06 ENCOUNTER — Other Ambulatory Visit: Payer: Self-pay | Admitting: Internal Medicine

## 2015-07-27 DIAGNOSIS — L433 Subacute (active) lichen planus: Secondary | ICD-10-CM | POA: Diagnosis not present

## 2015-08-02 DIAGNOSIS — M4712 Other spondylosis with myelopathy, cervical region: Secondary | ICD-10-CM | POA: Diagnosis not present

## 2015-08-02 DIAGNOSIS — M9901 Segmental and somatic dysfunction of cervical region: Secondary | ICD-10-CM | POA: Diagnosis not present

## 2015-08-02 DIAGNOSIS — M9903 Segmental and somatic dysfunction of lumbar region: Secondary | ICD-10-CM | POA: Diagnosis not present

## 2015-08-02 DIAGNOSIS — M542 Cervicalgia: Secondary | ICD-10-CM | POA: Diagnosis not present

## 2015-08-04 DIAGNOSIS — Z0000 Encounter for general adult medical examination without abnormal findings: Secondary | ICD-10-CM | POA: Diagnosis not present

## 2015-08-04 DIAGNOSIS — N138 Other obstructive and reflux uropathy: Secondary | ICD-10-CM | POA: Diagnosis not present

## 2015-08-04 DIAGNOSIS — N401 Enlarged prostate with lower urinary tract symptoms: Secondary | ICD-10-CM | POA: Diagnosis not present

## 2015-08-04 DIAGNOSIS — R350 Frequency of micturition: Secondary | ICD-10-CM | POA: Diagnosis not present

## 2015-08-07 ENCOUNTER — Other Ambulatory Visit: Payer: Self-pay | Admitting: Internal Medicine

## 2015-08-23 ENCOUNTER — Encounter: Payer: Self-pay | Admitting: Sports Medicine

## 2015-08-23 ENCOUNTER — Ambulatory Visit (INDEPENDENT_AMBULATORY_CARE_PROVIDER_SITE_OTHER): Payer: Federal, State, Local not specified - PPO | Admitting: Sports Medicine

## 2015-08-23 ENCOUNTER — Ambulatory Visit (INDEPENDENT_AMBULATORY_CARE_PROVIDER_SITE_OTHER): Payer: Federal, State, Local not specified - PPO

## 2015-08-23 DIAGNOSIS — M79673 Pain in unspecified foot: Secondary | ICD-10-CM | POA: Diagnosis not present

## 2015-08-23 DIAGNOSIS — M2021 Hallux rigidus, right foot: Secondary | ICD-10-CM | POA: Diagnosis not present

## 2015-08-23 DIAGNOSIS — M2022 Hallux rigidus, left foot: Secondary | ICD-10-CM

## 2015-08-23 MED ORDER — MELOXICAM 15 MG PO TABS
15.0000 mg | ORAL_TABLET | Freq: Every day | ORAL | Status: DC
Start: 2015-08-23 — End: 2015-09-19

## 2015-08-23 MED ORDER — TRIAMCINOLONE ACETONIDE 10 MG/ML IJ SUSP: 10.0000 mg | Freq: Once | INTRAMUSCULAR | Status: DC

## 2015-08-23 NOTE — Progress Notes (Signed)
Patient ID: UZZIAH RIGG, male   DOB: Mar 29, 1956, 60 y.o.   MRN: [id number] Subjective: DARIEL BETZER is a 60 y.o. male patient who presents to office for evaluation of Right<Left big toe joint pain. Patient complains of progressive pain especially over the last several months in the Right<Left foot that starts as pain over the bump dorsally with direct pressure and range of motion; patient now has difficulty fitting shoes comfortably and performing job duties with extensive walking. Patient has also tried rocker bottom shoe, inserts, and Advil with no resolution. Patient denies any other pedal complaints.   Patient Active Problem List   Diagnosis Date Noted  . Preventative health care 02/12/2012  . Osteoarthritis of both knees 11/02/2011  . NECK AND BACK PAIN 11/03/2009  . FOOT PAIN, BILATERAL 09/20/2009  . HEADACHE 05/06/2009  . DISORDER, DEPRESSIVE NEC 09/25/2006  . HERPES LABIALIS 04/04/2006  . THALASSEMIA NEC 04/04/2006  . Chronic hepatitis C without hepatic coma (HCC) 02/22/2006  . HYPERTENSION 02/22/2006  . CALCULUS, KIDNEY 02/22/2006  . SPONDYLOSIS, CERVICAL 02/22/2006  . COLONOSCOPY, HX OF 02/22/2006    Current Outpatient Prescriptions on File Prior to Visit  Medication Sig Dispense Refill  . amLODipine (NORVASC) 10 MG tablet Take 1 tablet (10 mg total) by mouth daily. 90 tablet 0  . atenolol (TENORMIN) 50 MG tablet TAKE 1 TABLET (50 MG TOTAL) BY MOUTH DAILY. 30 tablet 5  . benazepril (LOTENSIN) 40 MG tablet Take 2 tablets (80 mg total) by mouth daily. 180 tablet 0  . diazepam (VALIUM) 5 MG tablet Take 1 tablet (5 mg total) by mouth daily as needed for anxiety. 30 tablet 0  . FLUoxetine (PROZAC) 40 MG capsule Use as directed 1 capsule in the mouth or throat Daily.    . hydrochlorothiazide (HYDRODIURIL) 25 MG tablet TAKE 1 TABLET (25 MG TOTAL) BY MOUTH DAILY. 30 tablet 5  . OLANZapine (ZYPREXA) 5 MG tablet Use as directed 1 tablet in the mouth or throat Daily.    Marland Kitchen omeprazole  (PRILOSEC) 20 MG capsule Take 1 capsule (20 mg total) by mouth daily. 30 capsule 5  . Tamsulosin HCl (FLOMAX) 0.4 MG CAPS Take 0.4 mg by mouth at bedtime.       No current facility-administered medications on file prior to visit.    No Known Allergies  Objective:  General: Alert and oriented x3 in no acute distress  Dermatology: No open lesions bilateral lower extremities, no webspace macerations, no ecchymosis bilateral, Mild hypopigmented areas to bilateral lower extremities, all nails x 10 are well manicured.  Vascular: Dorsalis Pedis 2/4 and Posterior Tibial pedal pulses 1/4, Capillary Fill Time 3 seconds, (+) pedal hair growth bilateral, no edema bilateral lower extremities, Temperature gradient within normal limits.  Neurology: Michaell Cowing sensation intact via light touch bilateral, Protective sensation intact  with Phoebe Perch Monofilament to all pedal sites, Position sense intact, vibratory intact bilateral, Deep tendon reflexes within normal limits bilateral, No babinski sign present bilateral. (-) Tinels sign bilateral.  Musculoskeletal: Moderated tenderness with palpation right<left exostosis at dorsal 1st MTPJ bilateral with limitation without crepitus with end range of motion, no 1st ray hypermobility noted bilateral. Midtarsal, Subtalar joint, and ankle joint range of motion is within normal limits. Pes planus foot type and mild hammertoe bilateral.  Xrays  Right and Left Foot    Impression: Normal osseous mineralization, significant 1st MTPJ joint space narrowing with dorsal spurs, midtarsal breach suggestive of pes planus, mild hammertoe, no fracture/dislocation, soft tissues within normal limits.  Assessment and Plan: Problem List Items Addressed This Visit    None    Visit Diagnoses    Foot pain, unspecified laterality    -  Primary    Relevant Medications    triamcinolone acetonide (KENALOG) 10 MG/ML injection 10 mg    Other Relevant Orders    DG Foot 2 Views  Left    DG Foot 2 Views Right    Hallux rigidus of both feet        Relevant Medications    meloxicam (MOBIC) 15 MG tablet    triamcinolone acetonide (KENALOG) 10 MG/ML injection 10 mg        -Complete examination performed -Xrays reviewed -Discussed treatement options; discussed Hallux rigidus;conservative and  Surgical management (chielectomy vs decompression osteotomy); risks, benefits, alternatives discussed. All patient's questions answered. -After verbal consent, injected into right and left 1st MTPJ for symptomatic relief 1cc lidocaine plain, 1cc marcaine plain, 0.5cc dexamethasone without complication. Post injection care explained.  -Rx Mobic to take as instructed  -Temporary 6 month Handicap sticker form completed for patient  -Recommend continue with good supportive shoes and inserts. Applied felt offloading pads on current OTC inserts -Patient to return to office 4-6 weeks or sooner if condition worsens.  Asencion Islamitorya Ayomide Zuleta, DPM

## 2015-08-23 NOTE — Patient Instructions (Signed)
Hallux Rigidus Hallux rigidus is a condition involving pain and a loss of motion of the first (big) toe. The pain gets worse with lifting up (extension) of the toe. This is usually due to arthritic bony bumps (spurring) of the joint at the base of the big toe.  SYMPTOMS   Pain, with lifting up of the toe.  Tenderness over the joint where the big toe meets the foot.  Redness, swelling, and warmth over the top of the base of the big toe (sometimes).  Foot pain, stiffness, and limping. CAUSES  Hallux rigidus is caused by arthritis of the joint where the big toe meets the foot. The arthritis creates a bone spur that pinches the soft tissues when the toe is extended. RISK INCREASES WITH:  Tight shoes with a narrow toe box.  Family history of foot problems.  Gout and rheumatoid and psoriatic arthritis.  History of previous toe injury, including "turf toe."  Long first toe, flat feet, and other big toe bony bumps.  Arthritis of the big toe. PREVENTION   Wear wide-toed shoes that fit well.  Tape the big toe to reduce motion and to prevent pinching of the tissues between the bone.  Maintain physical fitness:  Foot and ankle flexibility.  Muscle strength and endurance. PROGNOSIS  This condition can usually be managed with proper treatment. However, surgery is typically required to prevent the problem from recurring.  RELATED COMPLICATIONS  Injury to other areas of the foot or ankle, caused by abnormal walking in an attempt to avoid the pain felt when walking normally. TREATMENT Treatment first involves stopping the activities that aggravate your symptoms. Ice and medicine can be used to reduce the pain and inflammation. Modifications to shoes may help reduce pain, including wearing stiff-soled shoes, shoes with a wide toe box, inserting a padded donut to relieve pressure on top of the joint, or wearing an arch support. Corticosteroid injections may be given to reduce inflammation. If  nonsurgical treatment is unsuccessful, surgery may be needed. Surgical options include removing the arthritic bony spur, cutting a bone in the foot to change the arc of motion (allowing the toe to extend more), or fusion of the joint (eliminating all motion in the joint at the base of the big toe).  MEDICATION   If pain medicine is needed, nonsteroidal anti-inflammatory medicines (aspirin and ibuprofen), or other minor pain relievers (acetaminophen), are often advised.  Do not take pain medicine for 7 days before surgery.  Prescription pain relievers are usually prescribed only after surgery. Use only as directed and only as much as you need.  Ointments for arthritis, applied to the skin, may give some relief.  Injections of corticosteroids may be given to reduce inflammation. HEAT AND COLD  Cold treatment (icing) relieves pain and reduces inflammation. Cold treatment should be applied for 10 to 15 minutes every 2 to 3 hours, and immediately after activity that aggravates your symptoms. Use ice packs or an ice massage.  Heat treatment may be used before performing the stretching and strengthening activities prescribed by your caregiver, physical therapist, or athletic trainer. Use a heat pack or a warm water soak. SEEK MEDICAL CARE IF:   Symptoms get worse or do not improve in 2 weeks, despite treatment.  After surgery you develop fever, increasing pain, redness, swelling, drainage of fluids, bleeding, or increasing warmth.  New, unexplained symptoms develop. (Drugs used in treatment may produce side effects.)   This information is not intended to replace advice given to   you by your health care provider. Make sure you discuss any questions you have with your health care provider.   Document Released: 04/02/2005 Document Revised: 04/23/2014 Document Reviewed: 07/15/2008 Elsevier Interactive Patient Education 2016 [employer]  

## 2015-08-29 ENCOUNTER — Other Ambulatory Visit: Payer: Self-pay | Admitting: Internal Medicine

## 2015-08-31 DIAGNOSIS — M9903 Segmental and somatic dysfunction of lumbar region: Secondary | ICD-10-CM | POA: Diagnosis not present

## 2015-08-31 DIAGNOSIS — M4712 Other spondylosis with myelopathy, cervical region: Secondary | ICD-10-CM | POA: Diagnosis not present

## 2015-08-31 DIAGNOSIS — M542 Cervicalgia: Secondary | ICD-10-CM | POA: Diagnosis not present

## 2015-08-31 DIAGNOSIS — M9901 Segmental and somatic dysfunction of cervical region: Secondary | ICD-10-CM | POA: Diagnosis not present

## 2015-09-06 ENCOUNTER — Other Ambulatory Visit: Payer: Self-pay | Admitting: Internal Medicine

## 2015-09-19 ENCOUNTER — Other Ambulatory Visit: Payer: Self-pay | Admitting: Sports Medicine

## 2015-09-19 NOTE — Telephone Encounter (Signed)
Pt needs an appt for follow up. 

## 2015-09-20 ENCOUNTER — Ambulatory Visit (INDEPENDENT_AMBULATORY_CARE_PROVIDER_SITE_OTHER): Payer: Federal, State, Local not specified - PPO | Admitting: Sports Medicine

## 2015-09-20 ENCOUNTER — Encounter: Payer: Self-pay | Admitting: Sports Medicine

## 2015-09-20 DIAGNOSIS — M79674 Pain in right toe(s): Secondary | ICD-10-CM

## 2015-09-20 DIAGNOSIS — M2021 Hallux rigidus, right foot: Secondary | ICD-10-CM | POA: Diagnosis not present

## 2015-09-20 DIAGNOSIS — M2022 Hallux rigidus, left foot: Secondary | ICD-10-CM

## 2015-09-20 DIAGNOSIS — M79675 Pain in left toe(s): Secondary | ICD-10-CM

## 2015-09-20 MED ORDER — MELOXICAM 15 MG PO TABS: 15.0000 mg | ORAL_TABLET | Freq: Every day | ORAL | Status: DC

## 2015-09-20 NOTE — Progress Notes (Signed)
Patient ID: Greg Flowers, male   DOB: 30-Sep-1955, 60 y.o.   MRN: [id number]   Subjective: Greg Flowers is a 60 y.o. male patient who returns to office for evaluation of Right<Left big toe joint pain. Patient reports that the injection and Mobic helped greatly; has no pain on the right and very minimal on left. Patient has brought new inserts for evaluation. Patient denies any other pedal complaints.   Patient Active Problem List   Diagnosis Date Noted  . Preventative health care 02/12/2012  . Osteoarthritis of both knees 11/02/2011  . NECK AND BACK PAIN 11/03/2009  . FOOT PAIN, BILATERAL 09/20/2009  . HEADACHE 05/06/2009  . DISORDER, DEPRESSIVE NEC 09/25/2006  . HERPES LABIALIS 04/04/2006  . THALASSEMIA NEC 04/04/2006  . Chronic hepatitis C without hepatic coma (HCC) 02/22/2006  . HYPERTENSION 02/22/2006  . CALCULUS, KIDNEY 02/22/2006  . SPONDYLOSIS, CERVICAL 02/22/2006  . COLONOSCOPY, HX OF 02/22/2006    Current Outpatient Prescriptions on File Prior to Visit  Medication Sig Dispense Refill  . amLODipine (NORVASC) 10 MG tablet Take 1 tablet (10 mg total) by mouth daily. 90 tablet 0  . atenolol (TENORMIN) 50 MG tablet TAKE 1 TABLET (50 MG TOTAL) BY MOUTH DAILY. 30 tablet 5  . benazepril (LOTENSIN) 40 MG tablet Take 2 tablets (80 mg total) by mouth daily. 180 tablet 0  . diazepam (VALIUM) 5 MG tablet Take 1 tablet (5 mg total) by mouth daily as needed for anxiety. 30 tablet 0  . FLUoxetine (PROZAC) 40 MG capsule Use as directed 1 capsule in the mouth or throat Daily.    . hydrochlorothiazide (HYDRODIURIL) 25 MG tablet TAKE 1 TABLET (25 MG TOTAL) BY MOUTH DAILY. 30 tablet 5  . OLANZapine (ZYPREXA) 5 MG tablet Use as directed 1 tablet in the mouth or throat Daily.    Marland Kitchen. omeprazole (PRILOSEC) 20 MG capsule Take 1 capsule (20 mg total) by mouth daily. 30 capsule 5  . Tamsulosin HCl (FLOMAX) 0.4 MG CAPS Take 0.4 mg by mouth at bedtime.       Current Facility-Administered Medications on  File Prior to Visit  Medication Dose Route Frequency Provider Last Rate Last Dose  . triamcinolone acetonide (KENALOG) 10 MG/ML injection 10 mg  10 mg Other Once [employer] Reda Citron, DPM        No Known Allergies  Objective:  General: Alert and oriented x3 in no acute distress  Dermatology: No open lesions bilateral lower extremities, no webspace macerations, no ecchymosis bilateral, Mild hypopigmented areas to bilateral lower extremities, all nails x 10 are well manicured.  Vascular: Dorsalis Pedis 2/4 and Posterior Tibial pedal pulses 1/4, Capillary Fill Time 3 seconds, (+) pedal hair growth bilateral, no edema bilateral lower extremities, Temperature gradient within normal limits.  Neurology: Michaell CowingGross sensation intact via light touch bilateral, Protective sensation intact  with Semmes Weinstein Monofilament to all pedal sites, Position sense intact, vibratory intact bilateral, Deep tendon reflexes within normal limits bilateral, No babinski sign present bilateral. (-) Tinels sign bilateral.  Musculoskeletal: Decreased tenderness with palpation right<left exostosis at dorsal 1st MTPJ bilateral with limitation without crepitus with end range of motion, no 1st ray hypermobility noted bilateral. Midtarsal, Subtalar joint, and ankle joint range of motion is within normal limits. Pes planus foot type and mild hammertoe bilateral.       Assessment and Plan: Problem List Items Addressed This Visit    None    Visit Diagnoses    Hallux rigidus of both feet    -  Primary    Relevant Medications    meloxicam (MOBIC) 15 MG tablet    Toe pain, bilateral        Relevant Medications    meloxicam (MOBIC) 15 MG tablet       -Complete examination performed -Discussed continued care for Hallux rigidus;conservative and  Surgical management (chielectomy vs decompression osteotomy); risks, benefits, alternatives discussed. All patient's questions answered. -Refilled Mobic  -Recommend continue with good  supportive shoes and inserts. Applied felt offloading pads on current OTC inserts.  -Patient to return to office as needed or sooner if condition worsens.  Asencion Islam, DPM

## 2015-09-28 DIAGNOSIS — M4712 Other spondylosis with myelopathy, cervical region: Secondary | ICD-10-CM | POA: Diagnosis not present

## 2015-09-28 DIAGNOSIS — M9901 Segmental and somatic dysfunction of cervical region: Secondary | ICD-10-CM | POA: Diagnosis not present

## 2015-09-28 DIAGNOSIS — M9903 Segmental and somatic dysfunction of lumbar region: Secondary | ICD-10-CM | POA: Diagnosis not present

## 2015-09-28 DIAGNOSIS — M542 Cervicalgia: Secondary | ICD-10-CM | POA: Diagnosis not present

## 2015-10-24 DIAGNOSIS — F321 Major depressive disorder, single episode, moderate: Secondary | ICD-10-CM | POA: Diagnosis not present

## 2015-10-26 DIAGNOSIS — M9901 Segmental and somatic dysfunction of cervical region: Secondary | ICD-10-CM | POA: Diagnosis not present

## 2015-10-26 DIAGNOSIS — M542 Cervicalgia: Secondary | ICD-10-CM | POA: Diagnosis not present

## 2015-10-26 DIAGNOSIS — M4712 Other spondylosis with myelopathy, cervical region: Secondary | ICD-10-CM | POA: Diagnosis not present

## 2015-10-26 DIAGNOSIS — M9903 Segmental and somatic dysfunction of lumbar region: Secondary | ICD-10-CM | POA: Diagnosis not present

## 2015-11-24 ENCOUNTER — Other Ambulatory Visit: Payer: Self-pay | Admitting: Internal Medicine

## 2015-11-25 ENCOUNTER — Other Ambulatory Visit: Payer: Self-pay | Admitting: Sports Medicine

## 2015-11-25 DIAGNOSIS — M79675 Pain in left toe(s): Secondary | ICD-10-CM

## 2015-11-25 DIAGNOSIS — M79674 Pain in right toe(s): Secondary | ICD-10-CM

## 2015-11-25 DIAGNOSIS — M2021 Hallux rigidus, right foot: Secondary | ICD-10-CM

## 2015-11-25 DIAGNOSIS — M2022 Hallux rigidus, left foot: Principal | ICD-10-CM

## 2015-11-25 NOTE — Telephone Encounter (Signed)
Pt needs an appt prior to future refills. 

## 2015-11-26 ENCOUNTER — Other Ambulatory Visit: Payer: Self-pay | Admitting: Internal Medicine

## 2015-11-30 DIAGNOSIS — M9903 Segmental and somatic dysfunction of lumbar region: Secondary | ICD-10-CM | POA: Diagnosis not present

## 2015-11-30 DIAGNOSIS — M9901 Segmental and somatic dysfunction of cervical region: Secondary | ICD-10-CM | POA: Diagnosis not present

## 2015-11-30 DIAGNOSIS — M4712 Other spondylosis with myelopathy, cervical region: Secondary | ICD-10-CM | POA: Diagnosis not present

## 2015-11-30 DIAGNOSIS — M542 Cervicalgia: Secondary | ICD-10-CM | POA: Diagnosis not present

## 2015-12-28 ENCOUNTER — Other Ambulatory Visit: Payer: Self-pay | Admitting: Sports Medicine

## 2015-12-28 DIAGNOSIS — M79674 Pain in right toe(s): Secondary | ICD-10-CM

## 2015-12-28 DIAGNOSIS — M79675 Pain in left toe(s): Secondary | ICD-10-CM

## 2015-12-28 DIAGNOSIS — M2022 Hallux rigidus, left foot: Principal | ICD-10-CM

## 2015-12-28 DIAGNOSIS — M2021 Hallux rigidus, right foot: Secondary | ICD-10-CM

## 2016-01-04 ENCOUNTER — Telehealth: Payer: Self-pay | Admitting: Internal Medicine

## 2016-01-04 NOTE — Telephone Encounter (Signed)
Pt needs an appt prior to future refills. 

## 2016-01-04 NOTE — Telephone Encounter (Signed)
   Reason for call:   I received a call from [name] at 6:20 PM indicating he is having severe lower back pain radiating down to both legs.   Pertinent Data:   Describes pain as sharp, radiating from his buttocks down to his toes  He describes having numbness in his toes as well  He reports difficulty walking due to pain but he is still able to ambulate. Denies saddle anesthesia, loss of bowel/bladder function.  States he tried to make an appointment today but was told he could not be seen until November as a new patient since he has not been seen since 2015. He states he had a "mental breakdown" and this is why he could not get into the clinic sooner. He states he is seeing a psychiatrist currently.  He has been taking Advil 800 mg three times daily and using heating pads to help the pain   Assessment / Plan / Recommendations:   Advised patient to continue conservative management for now  Will send message to front desk to see if we can get him in sooner  Advised to go to emergency room or urgent care if his symptoms worsen or he develops warning symptoms (saddle anesthesia, inability to walk, loss of bowel/bladder control)   Greg Hoffarly J Christien Frankl, MD   01/04/2016, 6:21 PM

## 2016-01-05 NOTE — Telephone Encounter (Signed)
[facility].

## 2016-01-09 ENCOUNTER — Ambulatory Visit (INDEPENDENT_AMBULATORY_CARE_PROVIDER_SITE_OTHER): Payer: Federal, State, Local not specified - PPO | Admitting: Internal Medicine

## 2016-01-09 VITALS — BP 147/94 | HR 117 | Temp 97.8°F | Ht 70.0 in | Wt 200.6 lb

## 2016-01-09 DIAGNOSIS — M792 Neuralgia and neuritis, unspecified: Secondary | ICD-10-CM

## 2016-01-09 DIAGNOSIS — I10 Essential (primary) hypertension: Secondary | ICD-10-CM | POA: Diagnosis not present

## 2016-01-09 DIAGNOSIS — Z87891 Personal history of nicotine dependence: Secondary | ICD-10-CM

## 2016-01-09 DIAGNOSIS — M5416 Radiculopathy, lumbar region: Secondary | ICD-10-CM | POA: Diagnosis not present

## 2016-01-09 DIAGNOSIS — Z791 Long term (current) use of non-steroidal anti-inflammatories (NSAID): Secondary | ICD-10-CM

## 2016-01-09 DIAGNOSIS — R531 Weakness: Secondary | ICD-10-CM

## 2016-01-09 DIAGNOSIS — G8929 Other chronic pain: Secondary | ICD-10-CM | POA: Insufficient documentation

## 2016-01-09 DIAGNOSIS — M549 Dorsalgia, unspecified: Secondary | ICD-10-CM

## 2016-01-09 MED ORDER — BENAZEPRIL HCL 40 MG PO TABS: 80.0000 mg | ORAL_TABLET | Freq: Every day | ORAL | 2 refills | Status: DC

## 2016-01-09 MED ORDER — ATENOLOL 50 MG PO TABS: ORAL_TABLET | ORAL | 2 refills | Status: DC

## 2016-01-09 MED ORDER — AMLODIPINE BESYLATE 10 MG PO TABS: 10.0000 mg | ORAL_TABLET | Freq: Every day | ORAL | 2 refills | Status: DC

## 2016-01-09 MED ORDER — GABAPENTIN 300 MG PO CAPS: 300.0000 mg | ORAL_CAPSULE | Freq: Three times a day (TID) | ORAL | 0 refills | Status: DC

## 2016-01-09 MED ORDER — DIAZEPAM 5 MG PO TABS: 5.0000 mg | ORAL_TABLET | Freq: Every day | ORAL | 0 refills | Status: DC | PRN

## 2016-01-09 MED ORDER — HYDROCHLOROTHIAZIDE 25 MG PO TABS: ORAL_TABLET | ORAL | 2 refills | Status: DC

## 2016-01-09 NOTE — Assessment & Plan Note (Signed)
Requesting refills for antihypertensives but has been off them for approximately 1 year. Currently has a Rx for Amlodipine 10mg , HCTZ 25mg , Benazepril 80mg , and Atenolol 40mg  that he states he was taking at the same time sometime last year. BP today is 147/94 and he has only been taking Amlodipine 10mg  daily (last medication to run out).   Plan: - Restart only Amlodipine 10mg  and HCTZ 25mg  daily for now - Reassess BP at next visit, if well-controlled can discontinue Atenolol and Benazepril

## 2016-01-09 NOTE — Progress Notes (Addendum)
   CC: Severe back and leg pain  HPI:  [name] is a 60 y.o. male with PMHx detailed below presenting with 2 months of progressive shooting pains that start in his lower back and travel down both legs to his calves along with numb toes in both feet without saddle anesthesia or bowel/bladder incontinence. These pains are impairing his ability to walk and work effectively (works for [employer]) and remains refractory to copious NSAID use (3-4 tablets Advil or Aleve, TID, every day). Also requiring multiple medication refills.  See problem based assessment and plan below for additional details.  Past Medical History:  Diagnosis Date  . Calculus, kidney 2000   Sees Dr. Isabel CapriceGrapey, urology.   . Cervical spondylosis 2004   sp surgery by Dr. Danielle DessElsner  . Depression   . Headache(784.0)    Chronic, on vicodin 5 TID for this.   . Hepatitis C    Has occasional visits with Dr. Randa EvensEdwards [facility], failed RX in 2000.  Liver Biopsy 9/06: Minimally active hepatitis consistent with hepatitis C, minimal necroinflamtory activity grade 1, no incrased fibrosis stage 0.   . Herpes labialis   . Hypertension   . Renal stone   . Thalassemia    HGB 9/09 14.4 wtih MCV 72.6    Review of Systems: Review of Systems  Constitutional: Negative for chills, fever and malaise/fatigue.  Respiratory: Negative for shortness of breath.   Cardiovascular: Negative for chest pain.  Gastrointestinal: Negative for constipation and diarrhea.  Genitourinary: Negative for frequency and urgency.  Musculoskeletal: Positive for back pain and joint pain. Negative for falls and neck pain.  Neurological: Negative for tingling, sensory change and headaches.    Physical Exam: Vitals:   01/09/16 1432  BP: (!) 147/94  Pulse: (!) 117  Temp: 97.8 F (36.6 C)  TempSrc: Oral  SpO2: 99%  Weight: 200 lb 9.6 oz (91 kg)  Height: 5\' 10"  (1.778 m)   Body mass index is 28.78 kg/m. GENERAL- Gentleman sitting comfortably in exam room chair with  bag of medications, alert, in no distress HEENT- Atraumatic, moist mucous membranes CARDIAC- Regular rate and rhythm, no murmurs, rubs or gallops. RESP- Clear to ascultation bilaterally, no wheezing or crackles, normal work of breathing ABDOMEN- Normoactive bowel sounds, soft, nontender, nondistended BACK- Normal curvature, no paraspinal tenderness, no spinal tenderness to palpation at all levels NEURO- Alert and oriented, cranial nerves grossly intact, strength 4/5 in all muscle groups of BLEs, otherwise 5/5 throughout, sensation grossly intact with exception of numbness to light touch in bilateral toes to mid-foot, shuffling gait EXTREMITIES- Normal bulk and range of motion, no edema, 2+ peripheral pulses, straight leg test positive in bilateral lower extremities, pain also worsened with extension of back SKIN- Warm, dry, intact, without visible rash PSYCH- Pleasant affect, clear speech, thoughts linear and goal-directed  Assessment & Plan:   See encounters tab for problem based medical decision making.  Patient seen with Dr. Rogelia BogaButcher

## 2016-01-09 NOTE — Assessment & Plan Note (Addendum)
Reporting progressive sharp shooting pains from his lower back that travels through his buttocks, thighs, and ends in his calves bilaterally. His pain is now severe and persistent (often 10/10) and impairs his ability to walk and has required him to alter his work duties. He is unable to walk into work from the parking lot without stopping to rest and recover. He endorses numbness of his toes and mid-foot bilaterally, but denies saddle anesthesia, or bowel/bladder incontinence or changes. 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 His pain is worsened by standing up straight and worsened by coughing, bearing down, sitting up.  Pain is improved minimally by large doses of NSAIDs, and has modest improvement with sitting down (taking weight off his back), leaning forward, and curling into a certain position in bed. He reports a history of injury to his cervical spine approx 12 years ago requiring spinal fusion surgery by Dr. Danielle DessElsner that he recovered well from with months of PT. Patient denies fevers, chills, or other sources of pain. On exam, his spine and back muscles are nontender to palpation, straight leg test is positive bilaterally, muscles of legs are 4/5 to strength testing, numbness of toes to midfoot, and gait is shuffling and painful. Overall this clinical history and findings is almost undoubtedly a severe lumbar radiculopathy from herniated disc(s), likely with some degree of spinal canal stenosis (based on positional findings).   Assessment: lumbar radiculopathy with severe bilateral neuropathic pain and mild weakness   Plan: - MRI of lumbar spine (scheduled 9/28) - Started Gabapentin 300mg  QHS, advised patient to self-titrate to BID in 3-4 days if no symptom relief and no signif drowsiness - Continue pain  control with PRN Tylenol, minimize NSAIDs - Advised patient to monitor for saddle anesthesia and incontinence symptoms - Patient required work note and temporary handicap placard but we forgot to give these before patient left, staff to contact patient to have these promptly provided - F/up in approx 1 week, will likely require referral to IR/NSG for steroid injection vs corrective surgery

## 2016-01-09 NOTE — Assessment & Plan Note (Signed)
Endorses taking 3-4 tablets of Advil or Aleve three times daily for the past two months, additionally some Mobic on top of this. Endorses some mild intermittent abdominal discomfort but denies overt pain, melena, bloody stools. Taking Omeprazole 20mg  QD. Denies fatigue, SOB.   Plan: - Check CBC, BMP for anemia and AKI - Strongly advised to reduce NSAID use by 50+% and to use Tylenol for aches/pains instead

## 2016-01-09 NOTE — Patient Instructions (Addendum)
We believe you have a herniated disc pressing on the nerves in your lower back.  The plan is to confirm this with an MRI and then likely set you up for a steroid injection or possibly a surgery. In the mean time we will continue to manage your pain as best we can.  A scheduler should call to set up an MRI of your back in the next few days. Remember you can take one of your Valium before this test to help with anxiety.  For your pain you should cut down on the number of Advil/Aleve/Mobic (NSAIDs) you are taking by at least half. Ideally no more than 1-2 tablets of these medications twice a day. Please try and take Tylenol instead. It is safe to take up to 3000 mg of Tylenol in one day (500mg  extra-strength tablet x 6)  For the pain we have also prescribed Gabapentin 300mg  (one tablet) to take at bedtime, if this is not helping after 3-4 days and is not causing drowsiness you can increase to take it twice daily.  Please continue to take your medications as prescribed.  If you have any questions or concerns, please call the clinic at [phone] or if it is the weekend or after hours, you may call [phone] and ask for the internal medicine resident on call.

## 2016-01-10 ENCOUNTER — Encounter: Payer: Self-pay | Admitting: Internal Medicine

## 2016-01-10 LAB — BMP8+ANION GAP
Anion Gap: 18 mmol/L (ref 10.0–18.0)
BUN/Creatinine Ratio: 13 (ref 10–24)
BUN: 15 mg/dL (ref 8–27)
CO2: 23 mmol/L (ref 18–29)
Calcium: 9.9 mg/dL (ref 8.6–10.2)
Chloride: 97 mmol/L (ref 96–106)
Creatinine, Ser: 1.19 mg/dL (ref 0.76–1.27)
GFR calc Af Amer: 75 mL/min/{1.73_m2} (ref >59)
GFR calc non Af Amer: 65 mL/min/{1.73_m2} (ref >59)
Glucose: 93 mg/dL (ref 65–99)
Potassium: 4.3 mmol/L (ref 3.5–5.2)
Sodium: 138 mmol/L (ref 134–144)

## 2016-01-10 LAB — CBC
Hematocrit: 44 % (ref 37.5–51.0)
Hemoglobin: 14.3 g/dL (ref 12.6–17.7)
MCH: 22.5 pg — ABNORMAL LOW (ref 26.6–33.0)
MCHC: 32.5 g/dL (ref 31.5–35.7)
MCV: 69 fL — ABNORMAL LOW (ref 79–97)
Platelets: 215 10*3/uL (ref 150–379)
RBC: 6.36 x10E6/uL — ABNORMAL HIGH (ref 4.14–5.80)
RDW: 15.9 % — ABNORMAL HIGH (ref 12.3–15.4)
WBC: 8.1 10*3/uL (ref 3.4–10.8)

## 2016-01-11 NOTE — Progress Notes (Signed)
Internal Medicine Clinic Attending  I saw and evaluated the patient.  I personally confirmed the key portions of the history and exam documented by Dr. Johnson and I reviewed pertinent patient test results.  The assessment, diagnosis, and plan were formulated together and I agree with the documentation in the resident's note.  

## 2016-01-12 ENCOUNTER — Ambulatory Visit (HOSPITAL_COMMUNITY)
Admission: RE | Admit: 2016-01-12 | Discharge: 2016-01-12 | Disposition: A | Discharge status: Home / Self Care | Payer: Federal, State, Local not specified - PPO | Source: Ambulatory Visit | Attending: Internal Medicine | Admitting: Internal Medicine

## 2016-01-12 DIAGNOSIS — M5416 Radiculopathy, lumbar region: Secondary | ICD-10-CM | POA: Insufficient documentation

## 2016-01-12 DIAGNOSIS — M545 Low back pain: Secondary | ICD-10-CM | POA: Diagnosis not present

## 2016-01-12 DIAGNOSIS — M4806 Spinal stenosis, lumbar region: Secondary | ICD-10-CM | POA: Diagnosis not present

## 2016-01-12 DIAGNOSIS — M47897 Other spondylosis, lumbosacral region: Secondary | ICD-10-CM | POA: Diagnosis not present

## 2016-01-18 ENCOUNTER — Telehealth: Payer: Self-pay

## 2016-01-18 ENCOUNTER — Encounter: Payer: Self-pay | Admitting: Internal Medicine

## 2016-01-18 NOTE — Telephone Encounter (Signed)
APT. REMINDER CALL, LMTCB °

## 2016-01-19 ENCOUNTER — Ambulatory Visit (INDEPENDENT_AMBULATORY_CARE_PROVIDER_SITE_OTHER): Payer: Federal, State, Local not specified - PPO | Admitting: Internal Medicine

## 2016-01-19 DIAGNOSIS — M4727 Other spondylosis with radiculopathy, lumbosacral region: Secondary | ICD-10-CM

## 2016-01-19 DIAGNOSIS — Z87891 Personal history of nicotine dependence: Secondary | ICD-10-CM

## 2016-01-19 DIAGNOSIS — M48061 Spinal stenosis, lumbar region without neurogenic claudication: Secondary | ICD-10-CM | POA: Diagnosis not present

## 2016-01-19 DIAGNOSIS — Z23 Encounter for immunization: Secondary | ICD-10-CM | POA: Diagnosis not present

## 2016-01-19 DIAGNOSIS — Z0000 Encounter for general adult medical examination without abnormal findings: Secondary | ICD-10-CM

## 2016-01-19 DIAGNOSIS — M5416 Radiculopathy, lumbar region: Secondary | ICD-10-CM

## 2016-01-19 DIAGNOSIS — B182 Chronic viral hepatitis C: Secondary | ICD-10-CM | POA: Diagnosis not present

## 2016-01-19 MED ORDER — GABAPENTIN 300 MG PO CAPS: 600.0000 mg | ORAL_CAPSULE | Freq: Three times a day (TID) | ORAL | 1 refills | Status: DC

## 2016-01-19 MED ORDER — NAPROXEN 500 MG PO TABS: 500.0000 mg | ORAL_TABLET | Freq: Two times a day (BID) | ORAL | 2 refills | Status: DC

## 2016-01-19 NOTE — Patient Instructions (Signed)
It was a pleasure to meet you [name].  I am sorry you are having this back pain. I will prescribe you a pain medication called Naproxen. Please stop the Mobic when you start this.  I have increased your Gabapentin to 600 mg three times per day.  Please limit your Tylenol to 2 grams (2000 mg) per day as higher doses can cause liver problems in your case.  I have referred you to neurosurgery and infectious disease.  We are giving you the Tdap and flu shot today.   If you should have any loss of control of your bowel or bladder, numbness in the saddle region, or inability to use your legs please go to the Emergency Department.    Spinal Stenosis Spinal stenosis is an abnormal narrowing of the canals of your spine (vertebrae). CAUSES  Spinal stenosis is caused by areas of bone pushing into the central canals of your vertebrae. This condition can be present at birth (congenital). It also may be caused by arthritic deterioration of your vertebrae (spinal degeneration).  SYMPTOMS   Pain that is generally worse with activities, particularly standing and walking.  Numbness, tingling, hot or cold sensations, weakness, or weariness in your legs.  Frequent episodes of falling.  A foot-slapping gait that leads to muscle weakness. DIAGNOSIS  Spinal stenosis is diagnosed with the use of magnetic resonance imaging (MRI) or computed tomography (CT). TREATMENT  Initial therapy for spinal stenosis focuses on the management of the pain and other symptoms associated with the condition. These therapies include:  Practicing postural changes to lessen pressure on your nerves.  Exercises to strengthen the core of your body.  Loss of excess body weight.  The use of nonsteroidal anti-inflammatory medicines to reduce swelling and inflammation in your nerves. When therapies to manage pain are not successful, surgery to treat spinal stenosis may be recommended. This surgery involves removing excess  bone, which puts pressure on your nerve roots. During this surgery (laminectomy), the posterior boney arch (lamina) and excess bone around the facet joints are removed.   This information is not intended to replace advice given to you by your health care provider. Make sure you discuss any questions you have with your health care provider.   Document Released: 06/23/2003 Document Revised: 04/23/2014 Document Reviewed: 07/11/2012 Elsevier Interactive Patient Education [employer]2016 Elsevier Inc.

## 2016-01-19 NOTE — Progress Notes (Signed)
   CC: Back pain  HPI:  [name] is a 60 y.o. male with PMH as listed below and Cervical spondylosis s/p surgery, Chronic Hepatitis C who presents for follow up for his back pain.  Lumbar radiculopathy: Seen in clinic on 01/09/16 for lower back symptoms which began 2 months ago with lumbar pain and associated numbness down bilateral posterior legs to his toes. MRI showed severe spinal canal stenosis b/l with foraminal stenosis at L4-L5. Severe facet arthrosis at L3-S1 is also seen with segmental instability. He has been taking Tylenol 1000 mg TID, Mobic 10 mg qd, and Gabapentin 300 mg TID with only mild relief of his symptoms.  Chronic Hepatitis C: Reportedly failed treatment over 10 years ago. Seen by ID in 2015 with plans to start 12 weeks Harvoni treatment when initial workup completed, however patient did not follow up.   Preventative health care: Requesting Tdap and flu shot.  Past Medical History:  Diagnosis Date  . Calculus, kidney 2000   Sees Dr. Isabel CapriceGrapey, urology.   . Cervical spondylosis 2004   sp surgery by Dr. Danielle DessElsner  . Depression   . Headache(784.0)    Chronic, on vicodin 5 TID for this.   . Hepatitis C    Has occasional visits with Dr. Randa EvensEdwards [facility], failed RX in 2000.  Liver Biopsy 9/06: Minimally active hepatitis consistent with hepatitis C, minimal necroinflamtory activity grade 1, no incrased fibrosis stage 0.   . Herpes labialis   . Hypertension   . Renal stone   . Thalassemia    HGB 9/09 14.4 wtih MCV 72.6    Review of Systems:   Review of Systems  Constitutional: Negative for chills and fever.  Respiratory: Negative for shortness of breath.   Cardiovascular: Negative for chest pain.  Gastrointestinal: Negative for abdominal pain, nausea and vomiting.  Genitourinary: Negative for dysuria, frequency, hematuria and urgency.  Musculoskeletal: Positive for back pain. Negative for falls.  Neurological: Positive for tingling.       No saddle anesthesia or  incontinence.    Physical Exam:  Vitals:   01/19/16 1438  BP: (!) 165/94  Pulse: 99  Temp: 98 F (36.7 C)  TempSrc: Oral  SpO2: 96%  Weight: 206 lb 1.6 oz (93.5 kg)   Physical Exam  Constitutional: He is oriented to person, place, and time. He appears well-developed and well-nourished. No distress.  Eyes: No scleral icterus.  Cardiovascular: Normal rate and regular rhythm.   Pulmonary/Chest: Effort normal. No respiratory distress.  Musculoskeletal:  Walks ginger with body bent forward. Pain elicited with flexion and extension of back. Straight leg test positive b/l. No spinous process or paraspinal tenderness. Strength 5/5 lower extremities.  Neurological: He is alert and oriented to person, place, and time.     Assessment & Plan:   See Encounters Tab for problem based charting.  Patient discussed with Dr. Heide SparkNarendra

## 2016-01-20 NOTE — Assessment & Plan Note (Signed)
Tdap and flu shot given.

## 2016-01-20 NOTE — Assessment & Plan Note (Signed)
Seen in clinic on 01/09/16 for lower back symptoms which began 2 months ago with lumbar pain and associated numbness down bilateral posterior legs to his toes. MRI showed severe spinal canal stenosis b/l with foraminal stenosis at L4-L5. Severe facet arthrosis at L3-S1 is also seen with segmental instability. He has been taking Tylenol 1000 mg TID, Mobic 10 mg qd, and Gabapentin 300 mg TID with only mild relief of his symptoms.  I explained the MRI results with patient and showed him the images from his scan. We will try to adjust his medications for better pain control and refer to neurosurgery for further evaluation/management.  -Start Naproxen 500 mg BID, stop Mobic -Decrease Tylenol to 1000 mg BID given hx of Hep C -Increase Gabapentin to 600 mg TID -Neurosurgery referral

## 2016-01-20 NOTE — Assessment & Plan Note (Signed)
Reportedly failed Ribavirin treatment over 10 years ago. Seen by ID in 2015 with plans to start 12 weeks Harvoni treatment when initial workup completed, however patient did not follow up.  Patient advised to follow up with ID for possible treatment. Explained to patient that current treatment options have shown excellent results. -ID referral sent -Decrease Tylenol to 2 grams total daily

## 2016-01-23 NOTE — Progress Notes (Signed)
Internal Medicine Clinic Attending  Case discussed with Dr. Patel,Vishal at the time of the visit.  We reviewed the resident's history and exam and pertinent patient test results.  I agree with the assessment, diagnosis, and plan of care documented in the resident's note.  

## 2016-01-24 ENCOUNTER — Encounter: Payer: Self-pay | Admitting: Internal Medicine

## 2016-01-30 DIAGNOSIS — M4316 Spondylolisthesis, lumbar region: Secondary | ICD-10-CM | POA: Diagnosis not present

## 2016-01-31 DIAGNOSIS — M5416 Radiculopathy, lumbar region: Secondary | ICD-10-CM | POA: Diagnosis not present

## 2016-01-31 DIAGNOSIS — M5136 Other intervertebral disc degeneration, lumbar region: Secondary | ICD-10-CM | POA: Diagnosis not present

## 2016-01-31 DIAGNOSIS — M4316 Spondylolisthesis, lumbar region: Secondary | ICD-10-CM | POA: Diagnosis not present

## 2016-01-31 DIAGNOSIS — M4726 Other spondylosis with radiculopathy, lumbar region: Secondary | ICD-10-CM | POA: Diagnosis not present

## 2016-01-31 DIAGNOSIS — M48061 Spinal stenosis, lumbar region without neurogenic claudication: Secondary | ICD-10-CM | POA: Diagnosis not present

## 2016-02-01 ENCOUNTER — Telehealth: Payer: Self-pay | Admitting: *Deleted

## 2016-02-01 ENCOUNTER — Other Ambulatory Visit: Payer: Self-pay | Admitting: Neurological Surgery

## 2016-02-01 NOTE — Telephone Encounter (Signed)
Patient rereferred to [facility] for hepatitis c treatment. He had an initial visit in 2015, but did  Not return.  RN left message stating Dr. Allena KatzPatel referred patient to Dr. Luciana Axeomer for treatment, asked him to call back to schedule an appointment. Andree CossHowell, Nupur Hohman M, RN

## 2016-02-06 ENCOUNTER — Encounter: Payer: Self-pay | Admitting: Internal Medicine

## 2016-02-06 NOTE — Telephone Encounter (Signed)
Patient called to advise that he is having back surgery 02/13/16 and will call us once he is back on his feet to schedule his Hep C visit.

## 2016-02-06 NOTE — Pre-Procedure Instructions (Addendum)
    Greg Flowers  02/06/2016      [id number] [place] - [address] [facility] [id number] [place] [id number] Phone: [phone] Fax: [phone]    Your procedure is scheduled on Monday, February 13, 2016  [facility] at  7: 30 A.M.  Call this number if you have problems the morning of surgery:  [phone]   Remember:  Do not eat food or drink liquids after midnight Sunday, February 12, 2016  Take these medicines the morning of surgery with A SIP OF WATER : amLODipine (NORVASC),  atenolol (TENORMIN),  FLUoxetine (PROZAC), gabapentin (NEURONTIN), OLANZapine (ZYPREXA), omeprazole (PRILOSEC), if needed: diazepam (VALIUM) for anxiety, pain medication Stop taking Aspirin, vitamins, fish oil , Garlic and herbal medications. Do not take any NSAIDs ie: Ibuprofen, Advil, Naproxen, BC and Goody Powder or any medication containing Aspirin; stop now.  Do not wear jewelry, make-up or nail polish.  Do not wear lotions, powders, or perfumes, or deoderant.  Do not shave 48 hours prior to surgery.  Men may shave face and neck.  Do not bring valuables to the hospital.  [facility] is not responsible for any belongings or valuables.  Contacts, dentures or bridgework may not be worn into surgery.  Leave your suitcase in the car.  After surgery it may be brought to your room.  For patients admitted to the hospital, discharge time will be determined by your treatment team. Please read over the following fact sheets that you were given. Pain Booklet, Coughing and Deep Breathing, Blood Transfusion Information, MRSA Information and Surgical Site Infection Prevention

## 2016-02-07 ENCOUNTER — Encounter (HOSPITAL_COMMUNITY): Payer: Self-pay

## 2016-02-07 ENCOUNTER — Encounter (HOSPITAL_COMMUNITY)
Admission: RE | Admit: 2016-02-07 | Discharge: 2016-02-07 | Disposition: A | Discharge status: Home / Self Care | Payer: Federal, State, Local not specified - PPO | Source: Ambulatory Visit | Attending: Neurological Surgery | Admitting: Neurological Surgery

## 2016-02-07 DIAGNOSIS — Z01810 Encounter for preprocedural cardiovascular examination: Secondary | ICD-10-CM | POA: Diagnosis not present

## 2016-02-07 DIAGNOSIS — M5416 Radiculopathy, lumbar region: Secondary | ICD-10-CM | POA: Diagnosis not present

## 2016-02-07 DIAGNOSIS — I10 Essential (primary) hypertension: Secondary | ICD-10-CM | POA: Insufficient documentation

## 2016-02-07 DIAGNOSIS — I517 Cardiomegaly: Secondary | ICD-10-CM | POA: Diagnosis not present

## 2016-02-07 DIAGNOSIS — F419 Anxiety disorder, unspecified: Secondary | ICD-10-CM | POA: Diagnosis not present

## 2016-02-07 DIAGNOSIS — B182 Chronic viral hepatitis C: Secondary | ICD-10-CM | POA: Insufficient documentation

## 2016-02-07 DIAGNOSIS — B009 Herpesviral infection, unspecified: Secondary | ICD-10-CM | POA: Insufficient documentation

## 2016-02-07 DIAGNOSIS — N200 Calculus of kidney: Secondary | ICD-10-CM | POA: Insufficient documentation

## 2016-02-07 DIAGNOSIS — M542 Cervicalgia: Secondary | ICD-10-CM | POA: Insufficient documentation

## 2016-02-07 DIAGNOSIS — M47812 Spondylosis without myelopathy or radiculopathy, cervical region: Secondary | ICD-10-CM | POA: Insufficient documentation

## 2016-02-07 DIAGNOSIS — F329 Major depressive disorder, single episode, unspecified: Secondary | ICD-10-CM | POA: Diagnosis not present

## 2016-02-07 DIAGNOSIS — Z01812 Encounter for preprocedural laboratory examination: Secondary | ICD-10-CM | POA: Diagnosis not present

## 2016-02-07 DIAGNOSIS — R51 Headache: Secondary | ICD-10-CM | POA: Insufficient documentation

## 2016-02-07 DIAGNOSIS — D568 Other thalassemias: Secondary | ICD-10-CM | POA: Insufficient documentation

## 2016-02-07 HISTORY — DX: Spondylolisthesis, lumbar region: M43.16

## 2016-02-07 HISTORY — DX: Pneumonia, unspecified organism: J18.9

## 2016-02-07 HISTORY — DX: Presence of spectacles and contact lenses: Z97.3

## 2016-02-07 HISTORY — DX: Anxiety disorder, unspecified: F41.9

## 2016-02-07 LAB — CBC
HCT: 40.8 % (ref 39.0–52.0)
Hemoglobin: 13.2 g/dL (ref 13.0–17.0)
MCH: 22.6 pg — ABNORMAL LOW (ref 26.0–34.0)
MCHC: 32.4 g/dL (ref 30.0–36.0)
MCV: 69.7 fL — ABNORMAL LOW (ref 78.0–100.0)
Platelets: 287 10*3/uL (ref 150–400)
RBC: 5.85 MIL/uL — ABNORMAL HIGH (ref 4.22–5.81)
RDW: 16 % — ABNORMAL HIGH (ref 11.5–15.5)
WBC: 14.3 10*3/uL — ABNORMAL HIGH (ref 4.0–10.5)

## 2016-02-07 LAB — TYPE AND SCREEN
ABO/RH(D): B POS
Antibody Screen: NEGATIVE

## 2016-02-07 LAB — COMPREHENSIVE METABOLIC PANEL
ALT: 44 U/L (ref 17–63)
AST: 28 U/L (ref 15–41)
Albumin: 3.5 g/dL (ref 3.5–5.0)
Alkaline Phosphatase: 92 U/L (ref 38–126)
Anion gap: 8 (ref 5–15)
BUN: 16 mg/dL (ref 6–20)
CO2: 29 mmol/L (ref 22–32)
Calcium: 9.5 mg/dL (ref 8.9–10.3)
Chloride: 100 mmol/L — ABNORMAL LOW (ref 101–111)
Creatinine, Ser: 1.31 mg/dL — ABNORMAL HIGH (ref 0.61–1.24)
GFR calc Af Amer: >60 mL/min (ref >60)
GFR calc non Af Amer: 57 mL/min — ABNORMAL LOW (ref >60)
Glucose, Bld: 127 mg/dL — ABNORMAL HIGH (ref 65–99)
Potassium: 4.2 mmol/L (ref 3.5–5.1)
Sodium: 137 mmol/L (ref 135–145)
Total Bilirubin: 0.5 mg/dL (ref 0.3–1.2)
Total Protein: 6.9 g/dL (ref 6.5–8.1)

## 2016-02-07 LAB — SURGICAL PCR SCREEN
MRSA, PCR: NEGATIVE
Staphylococcus aureus: POSITIVE — AB

## 2016-02-07 LAB — ABO/RH: ABO/RH(D): B POS

## 2016-02-07 NOTE — Progress Notes (Signed)
Pt denies SOB, chest pain, and being under the care of a cardiologist. Pt denies having a stress test, echo and cardiac cath. Pt denies having an EKG and chest x ray within the last year. Pt denies having labs within the last 2 weeks. Pt chart forwarded to anesthesia to review increased WBC count.

## 2016-02-08 NOTE — Progress Notes (Addendum)
Anesthesia Chart Review:  Pt is a 60 year old male scheduled for L4-5 PLIF on 02/13/2016 with Barnett AbuHenry Elsner, MD.   PMH includes:  HTN, hepatitis C, thalassemia.  Former smoker. BMI 30.5  Medications include: amlodipine, atenolol, benazepril, HCTZ, Zyprexa, Prilosec  Preoperative labs reviewed.  WBC 14.3. Left voicemail for Shanda BumpsJessica in Dr. Verlee RossettiElsner's office about elevated WBC.   EKG 02/07/2016: NSR. Possible Left atrial enlargement  If no changes, I anticipate pt can proceed with surgery as scheduled.   Rica Mastngela Annise Boran, FNP-BC [facility]iology Phone: [phone] 02/08/2016 2:23 PM

## 2016-02-11 ENCOUNTER — Encounter: Payer: Self-pay | Admitting: Internal Medicine

## 2016-02-13 ENCOUNTER — Inpatient Hospital Stay (HOSPITAL_COMMUNITY): Payer: Federal, State, Local not specified - PPO

## 2016-02-13 ENCOUNTER — Encounter (HOSPITAL_COMMUNITY)
Admission: RE | Disposition: A | Discharge status: Home / Self Care | Payer: Self-pay | Source: Ambulatory Visit | Attending: Neurological Surgery

## 2016-02-13 ENCOUNTER — Encounter (HOSPITAL_COMMUNITY): Payer: Self-pay | Admitting: Urology

## 2016-02-13 ENCOUNTER — Inpatient Hospital Stay (HOSPITAL_COMMUNITY): Payer: Federal, State, Local not specified - PPO | Admitting: Emergency Medicine

## 2016-02-13 ENCOUNTER — Inpatient Hospital Stay (HOSPITAL_COMMUNITY): Payer: Federal, State, Local not specified - PPO | Admitting: Anesthesiology

## 2016-02-13 ENCOUNTER — Inpatient Hospital Stay (HOSPITAL_COMMUNITY)
Admission: RE | Admit: 2016-02-13 | Discharge: 2016-02-14 | DRG: 460 | Disposition: A | Discharge status: Home / Self Care | Payer: Federal, State, Local not specified - PPO | Source: Ambulatory Visit | Attending: Neurological Surgery | Admitting: Neurological Surgery

## 2016-02-13 DIAGNOSIS — Z87891 Personal history of nicotine dependence: Secondary | ICD-10-CM

## 2016-02-13 DIAGNOSIS — M4316 Spondylolisthesis, lumbar region: Secondary | ICD-10-CM | POA: Diagnosis not present

## 2016-02-13 DIAGNOSIS — Z981 Arthrodesis status: Secondary | ICD-10-CM

## 2016-02-13 DIAGNOSIS — Z79899 Other long term (current) drug therapy: Secondary | ICD-10-CM

## 2016-02-13 DIAGNOSIS — M48061 Spinal stenosis, lumbar region without neurogenic claudication: Secondary | ICD-10-CM | POA: Diagnosis not present

## 2016-02-13 DIAGNOSIS — I10 Essential (primary) hypertension: Secondary | ICD-10-CM | POA: Diagnosis present

## 2016-02-13 DIAGNOSIS — M5416 Radiculopathy, lumbar region: Secondary | ICD-10-CM | POA: Diagnosis not present

## 2016-02-13 DIAGNOSIS — M4322 Fusion of spine, cervical region: Secondary | ICD-10-CM | POA: Diagnosis not present

## 2016-02-13 DIAGNOSIS — Z419 Encounter for procedure for purposes other than remedying health state, unspecified: Secondary | ICD-10-CM

## 2016-02-13 SURGERY — POSTERIOR LUMBAR FUSION 1 LEVEL
Anesthesia: General | Site: Back

## 2016-02-13 MED ORDER — ATENOLOL 25 MG PO TABS
50.0000 mg | ORAL_TABLET | Freq: Every day | ORAL | Status: DC
  Administered 2016-02-14: 50 mg via ORAL
  Filled 2016-02-13: qty 2

## 2016-02-13 MED ORDER — THROMBIN 20000 UNITS EX SOLR
CUTANEOUS | Status: DC | PRN
  Administered 2016-02-13: 12:00:00 via TOPICAL

## 2016-02-13 MED ORDER — BISACODYL 10 MG RE SUPP: 10.0000 mg | Freq: Every day | RECTAL | Status: DC | PRN

## 2016-02-13 MED ORDER — CHLORHEXIDINE GLUCONATE CLOTH 2 % EX PADS: 6.0000 | MEDICATED_PAD | Freq: Once | CUTANEOUS | Status: DC

## 2016-02-13 MED ORDER — HYDROMORPHONE HCL 1 MG/ML IJ SOLN: 0.5000 mg | INTRAMUSCULAR | Status: DC | PRN

## 2016-02-13 MED ORDER — ACETAMINOPHEN 650 MG RE SUPP: 650.0000 mg | RECTAL | Status: DC | PRN

## 2016-02-13 MED ORDER — LIDOCAINE 2% (20 MG/ML) 5 ML SYRINGE
INTRAMUSCULAR | Status: AC
  Filled 2016-02-13: qty 10

## 2016-02-13 MED ORDER — METHOCARBAMOL 500 MG PO TABS
500.0000 mg | ORAL_TABLET | Freq: Four times a day (QID) | ORAL | Status: DC | PRN
  Administered 2016-02-14: 500 mg via ORAL
  Filled 2016-02-13: qty 1

## 2016-02-13 MED ORDER — BUPIVACAINE HCL (PF) 0.5 % IJ SOLN
INTRAMUSCULAR | Status: DC | PRN
  Administered 2016-02-13: 10 mL
  Administered 2016-02-13: 20 mL

## 2016-02-13 MED ORDER — CEFAZOLIN SODIUM-DEXTROSE 2-4 GM/100ML-% IV SOLN
INTRAVENOUS | Status: AC
  Filled 2016-02-13: qty 100

## 2016-02-13 MED ORDER — DEXTROSE 5 % IV SOLN
INTRAVENOUS | Status: DC | PRN
  Administered 2016-02-13: 50 ug/min via INTRAVENOUS

## 2016-02-13 MED ORDER — OLANZAPINE 5 MG PO TABS
5.0000 mg | ORAL_TABLET | Freq: Every day | ORAL | Status: DC
  Administered 2016-02-14: 5 mg via ORAL
  Filled 2016-02-13 (×2): qty 1

## 2016-02-13 MED ORDER — AMLODIPINE BESYLATE 10 MG PO TABS
10.0000 mg | ORAL_TABLET | Freq: Every day | ORAL | Status: DC
Start: 2016-02-14 — End: 2016-02-14
  Administered 2016-02-14: 10 mg via ORAL
  Filled 2016-02-13: qty 1

## 2016-02-13 MED ORDER — DOCUSATE SODIUM 100 MG PO CAPS
100.0000 mg | ORAL_CAPSULE | Freq: Two times a day (BID) | ORAL | Status: DC
  Administered 2016-02-13 – 2016-02-14 (×2): 100 mg via ORAL
  Filled 2016-02-13 (×2): qty 1

## 2016-02-13 MED ORDER — LACTATED RINGERS IV SOLN
INTRAVENOUS | Status: DC
  Administered 2016-02-13 (×3): via INTRAVENOUS

## 2016-02-13 MED ORDER — PHENYLEPHRINE HCL 10 MG/ML IJ SOLN
INTRAMUSCULAR | Status: DC | PRN
  Administered 2016-02-13 (×2): 80 ug via INTRAVENOUS

## 2016-02-13 MED ORDER — SUFENTANIL CITRATE 50 MCG/ML IV SOLN
INTRAVENOUS | Status: AC
Start: 2016-02-13 — End: 2016-02-13
  Filled 2016-02-13: qty 1

## 2016-02-13 MED ORDER — EPHEDRINE SULFATE 50 MG/ML IJ SOLN
INTRAMUSCULAR | Status: DC | PRN
  Administered 2016-02-13: 5 mg via INTRAVENOUS

## 2016-02-13 MED ORDER — PROPOFOL 10 MG/ML IV BOLUS
INTRAVENOUS | Status: DC | PRN
  Administered 2016-02-13: 200 mg via INTRAVENOUS

## 2016-02-13 MED ORDER — ROCURONIUM BROMIDE 10 MG/ML (PF) SYRINGE
PREFILLED_SYRINGE | INTRAVENOUS | Status: AC
  Filled 2016-02-13: qty 10

## 2016-02-13 MED ORDER — BENAZEPRIL HCL 20 MG PO TABS
80.0000 mg | ORAL_TABLET | Freq: Every day | ORAL | Status: DC
  Administered 2016-02-14: 80 mg via ORAL
  Filled 2016-02-13: qty 4

## 2016-02-13 MED ORDER — SODIUM CHLORIDE 0.9 % IV SOLN: 250.0000 mL | INTRAVENOUS | Status: DC

## 2016-02-13 MED ORDER — SENNA 8.6 MG PO TABS
1.0000 | ORAL_TABLET | Freq: Two times a day (BID) | ORAL | Status: DC
  Administered 2016-02-13 – 2016-02-14 (×2): 8.6 mg via ORAL
  Filled 2016-02-13 (×2): qty 1

## 2016-02-13 MED ORDER — HYDROMORPHONE HCL 1 MG/ML IJ SOLN
INTRAMUSCULAR | Status: AC
  Administered 2016-02-13: 0.5 mg via INTRAVENOUS
  Filled 2016-02-13: qty 0.5

## 2016-02-13 MED ORDER — TRAMADOL HCL 50 MG PO TABS: 50.0000 mg | ORAL_TABLET | Freq: Four times a day (QID) | ORAL | Status: DC | PRN

## 2016-02-13 MED ORDER — MIDAZOLAM HCL 5 MG/5ML IJ SOLN
INTRAMUSCULAR | Status: DC | PRN
  Administered 2016-02-13: 2 mg via INTRAVENOUS

## 2016-02-13 MED ORDER — THROMBIN 5000 UNITS EX SOLR
OROMUCOSAL | Status: DC | PRN
  Administered 2016-02-13: 13:00:00 via TOPICAL

## 2016-02-13 MED ORDER — FLEET ENEMA 7-19 GM/118ML RE ENEM: 1.0000 | ENEMA | Freq: Once | RECTAL | Status: DC | PRN

## 2016-02-13 MED ORDER — PANTOPRAZOLE SODIUM 40 MG PO TBEC
40.0000 mg | DELAYED_RELEASE_TABLET | Freq: Every day | ORAL | Status: DC
  Administered 2016-02-14: 40 mg via ORAL
  Filled 2016-02-13: qty 1

## 2016-02-13 MED ORDER — MIDAZOLAM HCL 2 MG/2ML IJ SOLN
INTRAMUSCULAR | Status: AC
  Filled 2016-02-13: qty 2

## 2016-02-13 MED ORDER — FLUOXETINE HCL 20 MG PO CAPS
40.0000 mg | ORAL_CAPSULE | Freq: Every day | ORAL | Status: DC
  Administered 2016-02-14: 40 mg via ORAL
  Filled 2016-02-13: qty 2

## 2016-02-13 MED ORDER — KETOROLAC TROMETHAMINE 15 MG/ML IJ SOLN
INTRAMUSCULAR | Status: AC
  Filled 2016-02-13: qty 1

## 2016-02-13 MED ORDER — HYDROCHLOROTHIAZIDE 25 MG PO TABS
25.0000 mg | ORAL_TABLET | Freq: Every day | ORAL | Status: DC
  Administered 2016-02-14: 25 mg via ORAL
  Filled 2016-02-13: qty 1

## 2016-02-13 MED ORDER — SODIUM CHLORIDE 0.9 % IJ SOLN
INTRAMUSCULAR | Status: AC
  Filled 2016-02-13: qty 10

## 2016-02-13 MED ORDER — BUPIVACAINE HCL (PF) 0.5 % IJ SOLN
INTRAMUSCULAR | Status: AC
  Filled 2016-02-13: qty 30

## 2016-02-13 MED ORDER — THROMBIN 20000 UNITS EX SOLR
CUTANEOUS | Status: AC
  Filled 2016-02-13: qty 20000

## 2016-02-13 MED ORDER — OXYCODONE-ACETAMINOPHEN 5-325 MG PO TABS
1.0000 | ORAL_TABLET | ORAL | Status: DC | PRN
  Administered 2016-02-13: 2 via ORAL
  Administered 2016-02-14: 1 via ORAL
  Filled 2016-02-13: qty 2
  Filled 2016-02-13: qty 1

## 2016-02-13 MED ORDER — GABAPENTIN 300 MG PO CAPS
600.0000 mg | ORAL_CAPSULE | Freq: Three times a day (TID) | ORAL | Status: DC
  Administered 2016-02-13 – 2016-02-14 (×2): 600 mg via ORAL
  Filled 2016-02-13 (×2): qty 2

## 2016-02-13 MED ORDER — PROPOFOL 10 MG/ML IV BOLUS
INTRAVENOUS | Status: AC
  Filled 2016-02-13: qty 20

## 2016-02-13 MED ORDER — LIDOCAINE-EPINEPHRINE 1 %-1:100000 IJ SOLN
INTRAMUSCULAR | Status: AC
  Filled 2016-02-13: qty 1

## 2016-02-13 MED ORDER — ONDANSETRON HCL 4 MG/2ML IJ SOLN
INTRAMUSCULAR | Status: DC | PRN
  Administered 2016-02-13: 4 mg via INTRAVENOUS

## 2016-02-13 MED ORDER — KETOROLAC TROMETHAMINE 15 MG/ML IJ SOLN
15.0000 mg | Freq: Four times a day (QID) | INTRAMUSCULAR | Status: AC
  Administered 2016-02-13 – 2016-02-14 (×5): 15 mg via INTRAVENOUS
  Filled 2016-02-13 (×5): qty 1

## 2016-02-13 MED ORDER — ONDANSETRON HCL 4 MG/2ML IJ SOLN: 4.0000 mg | INTRAMUSCULAR | Status: DC | PRN

## 2016-02-13 MED ORDER — DEXAMETHASONE SODIUM PHOSPHATE 10 MG/ML IJ SOLN
INTRAMUSCULAR | Status: DC | PRN
  Administered 2016-02-13: 10 mg via INTRAVENOUS

## 2016-02-13 MED ORDER — HYDROMORPHONE HCL 1 MG/ML IJ SOLN
0.2500 mg | INTRAMUSCULAR | Status: DC | PRN
  Administered 2016-02-13 (×2): 0.5 mg via INTRAVENOUS

## 2016-02-13 MED ORDER — PHENOL 1.4 % MT LIQD
1.0000 | OROMUCOSAL | Status: DC | PRN
Start: 2016-02-13 — End: 2016-02-14

## 2016-02-13 MED ORDER — 0.9 % SODIUM CHLORIDE (POUR BTL) OPTIME
TOPICAL | Status: DC | PRN
  Administered 2016-02-13: 1000 mL

## 2016-02-13 MED ORDER — SODIUM CHLORIDE 0.9 % IR SOLN
Status: DC | PRN
  Administered 2016-02-13: 13:00:00

## 2016-02-13 MED ORDER — SODIUM CHLORIDE 0.9% FLUSH: 3.0000 mL | INTRAVENOUS | Status: DC | PRN

## 2016-02-13 MED ORDER — ROCURONIUM BROMIDE 100 MG/10ML IV SOLN
INTRAVENOUS | Status: DC | PRN
  Administered 2016-02-13 (×2): 10 mg via INTRAVENOUS
  Administered 2016-02-13: 50 mg via INTRAVENOUS
  Administered 2016-02-13: 10 mg via INTRAVENOUS

## 2016-02-13 MED ORDER — SODIUM CHLORIDE 0.9% FLUSH
3.0000 mL | Freq: Two times a day (BID) | INTRAVENOUS | Status: DC
  Administered 2016-02-13: 3 mL via INTRAVENOUS

## 2016-02-13 MED ORDER — DEXAMETHASONE SODIUM PHOSPHATE 10 MG/ML IJ SOLN
INTRAMUSCULAR | Status: AC
  Filled 2016-02-13: qty 1

## 2016-02-13 MED ORDER — DIAZEPAM 5 MG PO TABS: 5.0000 mg | ORAL_TABLET | Freq: Every day | ORAL | Status: DC | PRN

## 2016-02-13 MED ORDER — ACETAMINOPHEN 325 MG PO TABS: 650.0000 mg | ORAL_TABLET | ORAL | Status: DC | PRN

## 2016-02-13 MED ORDER — ONDANSETRON HCL 4 MG/2ML IJ SOLN
INTRAMUSCULAR | Status: AC
  Filled 2016-02-13: qty 2

## 2016-02-13 MED ORDER — SALINE SPRAY 0.65 % NA SOLN: 2.0000 | NASAL | Status: DC | PRN

## 2016-02-13 MED ORDER — LIDOCAINE HCL (CARDIAC) 20 MG/ML IV SOLN
INTRAVENOUS | Status: DC | PRN
  Administered 2016-02-13: 80 mg via INTRAVENOUS

## 2016-02-13 MED ORDER — SUGAMMADEX SODIUM 200 MG/2ML IV SOLN
INTRAVENOUS | Status: DC | PRN
  Administered 2016-02-13: 200 mg via INTRAVENOUS

## 2016-02-13 MED ORDER — THROMBIN 5000 UNITS EX SOLR
CUTANEOUS | Status: AC
  Filled 2016-02-13: qty 5000

## 2016-02-13 MED ORDER — METHOCARBAMOL 1000 MG/10ML IJ SOLN
500.0000 mg | Freq: Four times a day (QID) | INTRAVENOUS | Status: DC | PRN
  Filled 2016-02-13: qty 5

## 2016-02-13 MED ORDER — LIDOCAINE-EPINEPHRINE 1 %-1:100000 IJ SOLN
INTRAMUSCULAR | Status: DC | PRN
  Administered 2016-02-13: 10 mL

## 2016-02-13 MED ORDER — POLYETHYLENE GLYCOL 3350 17 G PO PACK: 17.0000 g | PACK | Freq: Every day | ORAL | Status: DC | PRN

## 2016-02-13 MED ORDER — SUFENTANIL CITRATE 50 MCG/ML IV SOLN
INTRAVENOUS | Status: DC | PRN
  Administered 2016-02-13 (×2): 5 ug via INTRAVENOUS
  Administered 2016-02-13: 25 ug via INTRAVENOUS

## 2016-02-13 MED ORDER — TAMSULOSIN HCL 0.4 MG PO CAPS
0.4000 mg | ORAL_CAPSULE | Freq: Every day | ORAL | Status: DC
  Administered 2016-02-13: 0.4 mg via ORAL
  Filled 2016-02-13: qty 1

## 2016-02-13 MED ORDER — SUGAMMADEX SODIUM 200 MG/2ML IV SOLN
INTRAVENOUS | Status: AC
  Filled 2016-02-13: qty 2

## 2016-02-13 MED ORDER — MENTHOL 3 MG MT LOZG: 1.0000 | LOZENGE | OROMUCOSAL | Status: DC | PRN

## 2016-02-13 MED ORDER — CEFAZOLIN SODIUM-DEXTROSE 2-4 GM/100ML-% IV SOLN
2.0000 g | INTRAVENOUS | Status: AC
  Administered 2016-02-13: 2 g via INTRAVENOUS

## 2016-02-13 SURGICAL SUPPLY — 67 items
ADH SKN CLS APL DERMABOND .7 (GAUZE/BANDAGES/DRESSINGS) ×1
APL SRG 60D 8 XTD TIP BNDBL (TIP)
BAG DECANTER FOR FLEXI CONT (MISCELLANEOUS) ×2 IMPLANT
BASKET BONE COLLECTION (BASKET) ×1 IMPLANT
BLADE CLIPPER SURG (BLADE) IMPLANT
BONE CANC CHIPS 20CC PCAN1/4 (Bone Implant) ×2 IMPLANT
BUR MATCHSTICK NEURO 3.0 LAGG (BURR) ×2 IMPLANT
CAGE COROENT LRG MP 8X9X28-12 (Cage) ×2 IMPLANT
CANISTER SUCT 3000ML PPV (MISCELLANEOUS) ×2 IMPLANT
CHIPS CANC BONE 20CC PCAN1/4 (Bone Implant) ×1 IMPLANT
CONT SPEC 4OZ CLIKSEAL STRL BL (MISCELLANEOUS) ×2 IMPLANT
COVER BACK TABLE 60X90IN (DRAPES) ×2 IMPLANT
DECANTER SPIKE VIAL GLASS SM (MISCELLANEOUS) ×2 IMPLANT
DERMABOND ADVANCED (GAUZE/BANDAGES/DRESSINGS) ×1
DERMABOND ADVANCED .7 DNX12 (GAUZE/BANDAGES/DRESSINGS) ×1 IMPLANT
DEVICE DISSECT PLASMABLAD 3.0S (MISCELLANEOUS) ×1 IMPLANT
DRAPE C-ARM 42X72 X-RAY (DRAPES) ×4 IMPLANT
DRAPE HALF SHEET 40X57 (DRAPES) IMPLANT
DRAPE LAPAROTOMY 100X72X124 (DRAPES) ×2 IMPLANT
DRAPE POUCH INSTRU U-SHP 10X18 (DRAPES) ×2 IMPLANT
DRSG OPSITE POSTOP 4X6 (GAUZE/BANDAGES/DRESSINGS) ×1 IMPLANT
DURAPREP 26ML APPLICATOR (WOUND CARE) ×2 IMPLANT
DURASEAL APPLICATOR TIP (TIP) IMPLANT
DURASEAL SPINE SEALANT 3ML (MISCELLANEOUS) IMPLANT
ELECT REM PT RETURN 9FT ADLT (ELECTROSURGICAL) ×2
ELECTRODE REM PT RTRN 9FT ADLT (ELECTROSURGICAL) ×1 IMPLANT
GAUZE SPONGE 4X4 12PLY STRL (GAUZE/BANDAGES/DRESSINGS) ×2 IMPLANT
GAUZE SPONGE 4X4 16PLY XRAY LF (GAUZE/BANDAGES/DRESSINGS) ×1 IMPLANT
GLOVE BIO SURGEON STRL SZ8 (GLOVE) ×1 IMPLANT
GLOVE BIO SURGEON STRL SZ8.5 (GLOVE) ×1 IMPLANT
GLOVE BIOGEL PI IND STRL 8.5 (GLOVE) ×2 IMPLANT
GLOVE BIOGEL PI INDICATOR 8.5 (GLOVE) ×2
GLOVE ECLIPSE 8.5 STRL (GLOVE) ×4 IMPLANT
GOWN STRL REUS W/ TWL LRG LVL3 (GOWN DISPOSABLE) IMPLANT
GOWN STRL REUS W/ TWL XL LVL3 (GOWN DISPOSABLE) IMPLANT
GOWN STRL REUS W/TWL 2XL LVL3 (GOWN DISPOSABLE) ×4 IMPLANT
GOWN STRL REUS W/TWL LRG LVL3 (GOWN DISPOSABLE)
GOWN STRL REUS W/TWL XL LVL3 (GOWN DISPOSABLE)
GRAFT BNE CANC CHIPS 1-8 20CC (Bone Implant) IMPLANT
HEMOSTAT POWDER KIT SURGIFOAM (HEMOSTASIS) ×1 IMPLANT
KIT BASIN OR (CUSTOM PROCEDURE TRAY) ×2 IMPLANT
KIT ROOM TURNOVER OR (KITS) ×2 IMPLANT
MILL MEDIUM DISP (BLADE) ×1 IMPLANT
MODULE POWER NUVASIVE (MISCELLANEOUS) IMPLANT
NEEDLE HYPO 22GX1.5 SAFETY (NEEDLE) ×2 IMPLANT
NS IRRIG 1000ML POUR BTL (IV SOLUTION) ×2 IMPLANT
PACK LAMINECTOMY NEURO (CUSTOM PROCEDURE TRAY) ×2 IMPLANT
PAD ARMBOARD 7.5X6 YLW CONV (MISCELLANEOUS) ×6 IMPLANT
PATTIES SURGICAL .5 X1 (DISPOSABLE) ×1 IMPLANT
PLASMABLADE 3.0S (MISCELLANEOUS) ×4
POWER MODULE NUVASIVE (MISCELLANEOUS) ×2
ROD RELINE-O LORD 5.5X30MM (Rod) ×2 IMPLANT
SCREW LOCK RELINE 5.5 TULIP (Screw) ×4 IMPLANT
SCREW RELINE-O POLY 6.5X45 (Screw) ×4 IMPLANT
SPONGE LAP 4X18 X RAY DECT (DISPOSABLE) IMPLANT
SPONGE SURGIFOAM ABS GEL 100 (HEMOSTASIS) ×2 IMPLANT
SUT PROLENE 6 0 BV (SUTURE) IMPLANT
SUT VIC AB 1 CT1 18XBRD ANBCTR (SUTURE) ×1 IMPLANT
SUT VIC AB 1 CT1 8-18 (SUTURE) ×6
SUT VIC AB 2-0 CP2 18 (SUTURE) ×3 IMPLANT
SUT VIC AB 3-0 SH 8-18 (SUTURE) ×2 IMPLANT
SYR 3ML LL SCALE MARK (SYRINGE) ×8 IMPLANT
TOWEL OR 17X24 6PK STRL BLUE (TOWEL DISPOSABLE) ×2 IMPLANT
TOWEL OR 17X26 10 PK STRL BLUE (TOWEL DISPOSABLE) ×2 IMPLANT
TRAP SPECIMEN MUCOUS 40CC (MISCELLANEOUS) ×2 IMPLANT
TRAY FOLEY W/METER SILVER 16FR (SET/KITS/TRAYS/PACK) ×2 IMPLANT
WATER STERILE IRR 1000ML POUR (IV SOLUTION) ×2 IMPLANT

## 2016-02-13 NOTE — Anesthesia Procedure Notes (Signed)
Procedure Name: Intubation Date/Time: 02/13/2016 11:43 AM Performed by: ,  Pre-anesthesia Checklist: Patient identified, Emergency Drugs available and Patient being monitored Patient Re-evaluated:Patient Re-evaluated prior to inductionOxygen Delivery Method: Circle system utilized Preoxygenation: Pre-oxygenation with 100% oxygen Intubation Type: IV induction Ventilation: Mask ventilation without difficulty Laryngoscope Size: Miller and 3 Grade View: Grade II Tube type: Oral Tube size: 8.0 mm Number of attempts: 1 Airway Equipment and Method: Stylet,  Bite block and LTA kit utilized Placement Confirmation: ETT inserted through vocal cords under direct vision,  positive ETCO2 and breath sounds checked- equal and bilateral Secured at: 22 cm Tube secured with: Tape Dental Injury: Teeth and Oropharynx as per pre-operative assessment        

## 2016-02-13 NOTE — H&P (Signed)
CHIEF COMPLAINT:                                          Back pain, numbness from the buttocks down to the feet, bilaterally.  HISTORY OF PRESENT ILLNESS:                     [name] is a 60 year old, right-handed, individual whom I had taken care of in the past. Years ago, he had an anterior cervical discectomy at two levels that he recuperated well from. He tells me that for the past number of months he has been experiencing pain in the back radiating to the buttocks. It has gotten to the point where it has become essentially intolerable at times when he has to be on his feet holding his back up straight. He feels that the legs go numb, and he has felt some weakness in his legs that they are going to buckle at times. He ultimately underwent an MRI of the lumbar spine, and this study is brought in for review. The study reveals that the patient has evidence of spondylolisthesis at L4-L5 with a severe degree of stenosis at L4-L5. He has evidence of a disc herniation that has extruded itself behind the body of L4. He has marked facet hypertrophy with significant fluid in the facet joints, bilaterally, suggesting some looseness, and he has a severe central canal stenosis on this MRI. The other levels of his lumbar spine all appear to be quite healthy with normal alignment, good maintenance of the disc height. Today in the office to further his workup, I obtain an AP and lateral lumbar spine film with flexion and extension views. The radiographs demonstrate that there is 7 mm of anterolisthesis at L4 and L5. This reduces to approximately 4 mm in the extended position. The alignment is good in the coronal and the sagittal planes.  REVIEW OF SYSTEMS:                                    Systems review is notable for night sweats, balance disturbance, leg pain while walking, leg weakness, back pain, leg pains, some skin disease, anxiety, depression, and difficulty with coordination in the arms.  PAST MEDICAL  HISTORY:                                His past medical history reveals that his general health has been excellent.   . Current Medical Conditions:  He does have some hypertension.  . Medications and Allergies:  His current medications include amlodipine 10 mg a day, atenolol 50 mg, benazepril 40 mg, diazepam 5 mg, fluoxetine 40 mg, gabapentin 300 mg, hydrochlorothiazide 25 mg, naproxen 500 mg, olanzapine 5 mg, omeprazole 20 mg, and tamsulosin 0.4 mg capsules.  PHYSICAL EXAMINATION:                                On physical examination, I note that he stands straight and erect. Although when he walks, he does tend to favor a slight 5 degree forward stoop. His motor function appears good in the iliopsoas and the quads. However, his tibialis anterior is slightly weak, bilaterally,  at 4/5. Gastrocs strength appears normal. Tone and bulk is intact in the major groups in the lower extremities. Straight leg raising is markedly positive at 15 degrees in either lower extremity. Patrick maneuver is negative.  IMPRESSION AND PLAN:                                 The patient has evidence of a grade I spondylolisthesis at L4-L5 with a high-grade stenosis, a large central disc rupture, and mobility between the L4 and L5 levels. I demonstrated the findings to Greg Flowers and his wife who accompanies him on this visit. I noted that given the severity of the stenosis, ultimately, [name] needs to consider surgical decompression and stabilization. I demonstrated to him on a model the nature of the hardware that we use to place in his vertebrae to hold the vertebrae together between L4 and L5. I also noted that a spacer would be placed in the disc space along with bone graft to allow this to heal one bone to the other. This is a substantial operation, perhaps even a bit more substantial than the operation he had on his neck. Nonetheless, I believe that he is in the best possible situation to get good relief, as he has  purely single-level disease in his lumbar spine. I did note to him that the surgery typically requires about a three-day hospital stay. Thereafter, he would be getting up and around using a lumbar corset brace that we want him to wear when he is out of bed for about the first six-weeks' time. After that, we would wean him from the brace and gradually he should improve in his strength and his functionality such that at the earliest I would expect him back to the workplace would be eight weeks, but sometimes patients require a little bit more time before they are comfortable enough with that. I did indicate to [name] that I do not feel there is any room for conservative management of this process, as the stenosis is a mechanical defect and is only likely to get worse with time. I did indicate that we could consider an epidural steroid injection in an effort to buy some time before the surgery, but I am concerned that he has weakness in the tibialis anterior groups, bilaterally, and this is a sign of some definitive compression of the L5 nerve roots. He is admitted for surgery.

## 2016-02-13 NOTE — Progress Notes (Signed)
Orthopedic Tech Progress Note Patient Details:  Greg Flowers 28-Dec-1955 [id number]  Pre Fit brace order. Patient ID: Greg Flowers, male   DOB: 28-Dec-1955, 60 y.o.   MRN: [id number]   [name] 02/13/2016, 5:57 PM

## 2016-02-13 NOTE — Anesthesia Postprocedure Evaluation (Signed)
Anesthesia Post Note  Patient: Greg Flowers  Procedure(s) Performed: Procedure(s) (LRB): LUMBAR FOUR- FIVE POSTERIOR LUMBAR INTERBODY FUSION (N/A)  Patient location during evaluation: PACU Anesthesia Type: General Pain management: pain level controlled Vital Signs Assessment: post-procedure vital signs reviewed and stable Respiratory status: spontaneous breathing Cardiovascular status: stable Anesthetic complications: no    Last Vitals:  Vitals:   02/13/16 1500 02/13/16 1515  BP: (!) 158/99 (!) 153/106  Pulse: 73 72  Resp: 15 12  Temp: 37.1 C     Last Pain:  Vitals:   02/13/16 1515  TempSrc:   PainSc: 5       LLE Sensation: Numbness (02/13/16 1515)   RLE Sensation: Numbness (02/13/16 1515)      EDWARDS,Marixa Mellott

## 2016-02-13 NOTE — Transfer of Care (Signed)
Immediate Anesthesia Transfer of Care Note  Patient: Greg Flowers  Procedure(s) Performed: Procedure(s) with comments: LUMBAR FOUR- FIVE POSTERIOR LUMBAR INTERBODY FUSION (N/A) - L4-5 POSTERIOR LUMBAR INTERBODY FUSION  Patient Location: PACU  Anesthesia Type:General  Level of Consciousness: awake, alert , oriented and patient cooperative  Airway & Oxygen Therapy: Patient Spontanous Breathing and Patient connected to nasal cannula oxygen  Post-op Assessment: Report given to RN, Post -op Vital signs reviewed and stable and Patient moving all extremities  Post vital signs: Reviewed and stable  Last Vitals:  Vitals:   02/13/16 1459 02/13/16 1500  BP:  (!) 158/99  Pulse: 76 73  Resp: 20 15  Temp:  37.1 C    Last Pain:  Vitals:   02/13/16 0828  TempSrc:   PainSc: 4          Complications: No apparent anesthesia complications

## 2016-02-13 NOTE — Anesthesia Preprocedure Evaluation (Addendum)
Anesthesia Evaluation  Patient identified by MRN, date of birth, ID band Patient awake    Reviewed: Allergy & Precautions, NPO status , Patient's Chart, lab work & pertinent test results  Airway Mallampati: II  TM Distance: >3 FB     Dental   Pulmonary pneumonia, former smoker,    breath sounds clear to auscultation       Cardiovascular hypertension,  Rhythm:Regular Rate:Normal     Neuro/Psych  Headaches,    GI/Hepatic negative GI ROS, (+) Hepatitis -  Endo/Other    Renal/GU Renal disease     Musculoskeletal   Abdominal   Peds  Hematology  (+) anemia ,   Anesthesia Other Findings   Reproductive/Obstetrics                             Anesthesia Physical Anesthesia Plan  ASA: III  Anesthesia Plan: General   Post-op Pain Management:    Induction: Intravenous  Airway Management Planned: Oral ETT  Additional Equipment:   Intra-op Plan:   Post-operative Plan: Possible Post-op intubation/ventilation  Informed Consent: I have reviewed the patients History and Physical, chart, labs and discussed the procedure including the risks, benefits and alternatives for the proposed anesthesia with the patient or authorized representative who has indicated his/her understanding and acceptance.   Dental advisory given  Plan Discussed with: CRNA and Anesthesiologist  Anesthesia Plan Comments:         Anesthesia Quick Evaluation

## 2016-02-13 NOTE — Progress Notes (Signed)
Patient ID: Greg Flowers, male   DOB: 03/13/1956, 60 y.o.   MRN: [id number] Vital signs are stable Motor function is intact Dressing is clean and dry Leg strength feels good

## 2016-02-13 NOTE — Op Note (Signed)
Date of surgery: 02/13/2016 Preoperative diagnosis: Spondylolisthesis L4-L5 with radiculopathy Postoperative diagnosis: Spondylolisthesis L4-L5 with radiculopathy Procedure: L4-L5 decompressive laminectomy decompression of L4 and L5 nerve roots, posterior lumbar interbody arthrodesis with peek spacers local autograft and allograft, pedicle screw fixation L4-L5, posterior lateral arthrodesis L4-L5  Surgeon: Barnett AbuHenry Henslee Lottman M.D.  Asst.: Tressie StalkerJeffrey Jenkins M.D.  Indications: Patient is Greg Flowers is a 60 y.o. male who who's had significant back pain and lumbar radiculopathy for over a years period time. A lumbar MRI demonstrates advanced spondylolisthesis with high-grade canal stenosis. he was advised regarding surgical intervention.  Procedure: The patient was brought to the operating room supine on a stretcher. After the smooth induction of general endotracheal anesthesia she was turned prone and the back was prepped with alcohol and DuraPrep. The back was then draped sterilely. A midline incision was created and carried down to the lumbar dorsal fascia. A localizing radiograph identified the L4 and L5 spinous processes. A subligamentous dissection was created at L4 and L5 to expose the interlaminar space at L4 and L5 and the facet joints over the L4-L5 interspace. Laminotomies were were then created removing the entire inferior margin of the lamina of L4 including the inferior facet at the L4-L5 joint. The yellow ligament was taken up and the common dural tube was exposed along with the L4 nerve root superiorly, and the L5 nerve root inferiorly, the disc space was exposed and epidural veins in this region were cauterized and divided. The L4 nerve roots and the L5 nerve root were dissected with care taken to protect them. The disc space was opened and a combination of curettes and rongeurs was used to evacuate the disc space fully. The endplates were removed using sharp curettes. An interbody spacer was  placed to distract the disc space while the contralateral discectomy was performed. When the entirety of the disc was removed and the endplates were prepared final sizing of the disc space was obtained 8 x 28 mm peek spacers with 12 lordosis were chosen and packed with autograft and allograft and placed into the interspace. The remainder of the interspace was packed with autograft and allograft. Pedicle entry sites were then chosen using fluoroscopic guidance and 6.5 x 45 mm screws were placed in L4 and 6.5 x 45 mm screws were placed in L5. The lateral gutters were decorticated and graft was packed in the posterolateral gutters between L4 and L5. Final radiographs were obtained after placing appropriately sized rods between the pedicle screws at L4-L5 and torquing these to the appropriate tension. The surgical site was inspected carefully to assure the L4 and L5 nerve roots were well decompressed, hemostasis was obtained, and the graft was well packed. Then the retractors were removed and the wound was closed with #1 Vicryl in the lumbar dorsal fascia 2-0 Vicryl in the subcutaneous tissue and 3-0 Vicryl subcuticularly. When he cc of half percent Marcaine was injected into the paraspinous musculature at the time of closure. Blood loss was estimated at 250 cc. The patient tolerated procedure well and was returned to the recovery room in stable condition.

## 2016-02-14 MED ORDER — DEXAMETHASONE SODIUM PHOSPHATE 4 MG/ML IJ SOLN
4.0000 mg | Freq: Once | INTRAMUSCULAR | Status: AC
  Administered 2016-02-14: 4 mg via INTRAVENOUS
  Filled 2016-02-14: qty 1

## 2016-02-14 MED ORDER — DIAZEPAM 5 MG PO TABS: 5.0000 mg | ORAL_TABLET | Freq: Every day | ORAL | 0 refills | Status: DC | PRN

## 2016-02-14 MED ORDER — OXYCODONE-ACETAMINOPHEN 5-325 MG PO TABS: 1.0000 | ORAL_TABLET | ORAL | 0 refills | Status: DC | PRN

## 2016-02-14 MED FILL — Sodium Chloride IV Soln 0.9%: INTRAVENOUS | Qty: 1000 | Status: AC

## 2016-02-14 MED FILL — Heparin Sodium (Porcine) Inj 1000 Unit/ML: INTRAMUSCULAR | Qty: 30 | Status: AC

## 2016-02-14 NOTE — Clinical Social Work Note (Signed)
CSW consulted for New SNF. Per chart review, PT is not recommending follow up. RNCM is following for discharge planning needs. CSW is signing off as no further needs identified.   Dede QuerySarah Radley Barto, MSW, LCSW Clinical Social Worker [phone]

## 2016-02-14 NOTE — Progress Notes (Signed)
Occupational Therapy Evaluation Patient Details Name: Greg Flowers MRN: [id number] DOB: 04/02/1956 Today's Date: 02/14/2016    History of Present Illness Adm for L4-L5 decompressive laminectomy decompression, posterior lumbar interbody arthrodesis with peek spacers, pedicle screw fixation L4-L5 Significant PMHx- cervical fusion, HTN, Hep C, Depression/anxiety   Clinical Impression   Completed education regarding ADL and functional mobility with use of back precautions, compensatory techniques and AE/DME. Pt verbalized understanding. Pt safe to D/C home with intermittent S.     Follow Up Recommendations  No OT follow up;Supervision - Intermittent    Equipment Recommendations  3 in 1 bedside comode    Recommendations for Other Services       Precautions / Restrictions Precautions Precautions: Back Precaution Booklet Issued: Yes (comment) Precaution Comments: educated on BLT Required Braces or Orthoses: Spinal Brace Spinal Brace: Lumbar corset;Applied in sitting position (educated on technique)      Mobility Bed Mobility               General bed mobility comments: OOB in chair. verballed reviewed  Transfers Overall transfer level: Needs assistance Equipment used: Rolling walker (2 wheeled)   Sit to Stand: Min assist (from lower surface)         General transfer comment: vc for use of RW    Balance Overall balance assessment: Needs assistance         Standing balance support: During functional activity Standing balance-Leahy Scale: Good                              ADL Overall ADL's : Needs assistance/impaired     Grooming: Set up   Upper Body Bathing: Set up   Lower Body Bathing: Minimal assistance;Sit to/from stand   Upper Body Dressing : Set up;Sitting   Lower Body Dressing: Moderate assistance;Sit to/from stand   Toilet Transfer: Hydrographic surveyorMin guard Toilet Transfer Details (indicate cue type and reason): DIFFICULTY STANDING FROM  LOW TOILET Toileting- Clothing Manipulation and Hygiene: Minimal assistance       Functional mobility during ADLs: Supervision/safety;Rolling walker General ADL Comments: Educated pt on use of AE for LB ADL. Educated on back precautions and reduing risk of falls. Pt verbalzied understanding and ableto return demonstrate use of AE     Vision     Perception     Praxis      Pertinent Vitals/Pain Pain Assessment: 0-10 Pain Score: 4  Pain Location: back Pain Descriptors / Indicators: Guarding;Sore Pain Intervention(s): Limited activity within patient's tolerance     Hand Dominance     Extremity/Trunk Assessment Upper Extremity Assessment Upper Extremity Assessment: Overall WFL for tasks assessed   Lower Extremity Assessment Lower Extremity Assessment: Defer to PT evaluation RLE Sensation:  (in toes (better than PTA)) LLE Sensation:  (in toes (better than PTA))   Cervical / Trunk Assessment Cervical / Trunk Assessment: Normal (post-surgery; good posture)   Communication Communication Communication: No difficulties   Cognition Arousal/Alertness: Awake/alert Behavior During Therapy: WFL for tasks assessed/performed Overall Cognitive Status: Within Functional Limits for tasks assessed                     General Comments       Exercises       Shoulder Instructions      Home Living Family/patient expects to be discharged to:: Private residence Living Arrangements: Spouse/significant other Available Help at Discharge: Family;Available 24 hours/day Type of Home: House Home Access:  Stairs to enter Entergy CorporationEntrance Stairs-Number of Steps: 2 Entrance Stairs-Rails: None Home Layout: Two level;Able to live on main level with bedroom/bathroom     Bathroom Shower/Tub: Producer, television/film/videoWalk-in shower   Bathroom Toilet: Standard Bathroom Accessibility:  (questionable)   Home Equipment: Crutches;Adaptive equipment Adaptive Equipment: Reacher        Prior Functioning/Environment  Level of Independence: Independent        Comments: retired from Research officer, political partypostal service        OT Problem List: Decreased strength;Decreased range of motion;Decreased activity tolerance;Decreased knowledge of use of DME or AE;Decreased knowledge of precautions;Pain   OT Treatment/Interventions:      OT Goals(Current goals can be found in the care plan section) Acute Rehab OT Goals Patient Stated Goal: decr pain and return to active retirement OT Goal Formulation: All assessment and education complete, DC therapy  OT Frequency:     Barriers to D/C:            Co-evaluation              End of Session Equipment Utilized During Treatment: Rolling walker;Back brace Nurse Communication: Mobility status  Activity Tolerance: Patient tolerated treatment well Patient left: in chair;with call bell/phone within reach;with family/visitor present   Time: [id number] OT Time Calculation (min): 21 min Charges:  OT General Charges $OT Visit: 1 Procedure OT Evaluation $OT Eval Moderate Complexity: 1 Procedure G-Codes:    Emry Barbato,HILLARY 02/14/2016, 1:43 PM   [facility] Centeril[facility] Teryl Gubler, OTR/L  [phone] 02/14/2016

## 2016-02-14 NOTE — Discharge Summary (Signed)
Physician Discharge Summary  Patient ID: Greg Flowers MRN: [id number] DOB/AGE: 06/09/1955 60 y.o.  Admit date: 02/13/2016 Discharge date: 02/14/2016  Admission Diagnoses:Spondylolisthesis L4-5 with radiculopathy and stenosis  Discharge Diagnoses: Spondylolisthesis L4-5 with radiculopathy and stenosis Active Problems:   Spondylolisthesis at L4-L5 level   Discharged Condition: good  Hospital Course: Patient tolerated surgery well  Consults: None  Significant Diagnostic Studies: none  Treatments: see op note  Discharge Exam: Blood pressure 134/83, pulse 84, temperature 97.7 F (36.5 C), temperature source Oral, resp. rate 20, height 5\' 9"  (1.753 m), weight 94 kg (207 lb 4 oz), SpO2 97 %. incision clean and dry, motor function intact  Disposition:discharge home  Discharge Instructions    Call MD for:  redness, tenderness, or signs of infection (pain, swelling, redness, odor or green/yellow discharge around incision site)    Complete by:  As directed    Call MD for:  severe uncontrolled pain    Complete by:  As directed    Call MD for:  temperature >100.4    Complete by:  As directed    Diet - low sodium heart healthy    Complete by:  As directed    Discharge instructions    Complete by:  As directed    Okay to shower. Do not apply salves or appointments to incision. No heavy lifting with the upper extremities greater than 15 pounds. May resume driving when not requiring pain medication and patient feels comfortable with doing so.   Increase activity slowly    Complete by:  As directed        Medication List    TAKE these medications   acetaminophen 500 MG tablet Commonly known as:  TYLENOL Take 1,000 mg by mouth 3 (three) times daily as needed for moderate pain or headache.   amLODipine 10 MG tablet Commonly known as:  NORVASC Take 1 tablet (10 mg total) by mouth daily.   atenolol 50 MG tablet Commonly known as:  TENORMIN TAKE 1 TABLET (50 MG TOTAL) BY MOUTH  DAILY.   benazepril 40 MG tablet Commonly known as:  LOTENSIN Take 2 tablets (80 mg total) by mouth daily.   [employer] SPECTRAVITE ULTRA MEN 50+ PO Take 1 tablet by mouth daily.   diazepam 5 MG tablet Commonly known as:  VALIUM Take 1 tablet (5 mg total) by mouth daily as needed for anxiety. What changed:  Another medication with the same name was added. Make sure you understand how and when to take each.   diazepam 5 MG tablet Commonly known as:  VALIUM Take 1 tablet (5 mg total) by mouth daily as needed for muscle spasms. What changed:  You were already taking a medication with the same name, and this prescription was added. Make sure you understand how and when to take each.   FLUoxetine 40 MG capsule Commonly known as:  PROZAC Take 40 mg by mouth Daily.   gabapentin 300 MG capsule Commonly known as:  NEURONTIN Take 2 capsules (600 mg total) by mouth 3 (three) times daily.   Garlic 2000 MG Caps Take 4,000 mg by mouth daily.   hydrochlorothiazide 25 MG tablet Commonly known as:  HYDRODIURIL TAKE 1 TABLET (25 MG TOTAL) BY MOUTH DAILY.   meloxicam 15 MG tablet Commonly known as:  MOBIC Take 15 mg by mouth daily.   naproxen 500 MG tablet Commonly known as:  NAPROSYN Take 1 tablet (500 mg total) by mouth 2 (two) times daily with a meal.  OLANZapine 5 MG tablet Commonly known as:  ZYPREXA Take 5 mg by mouth Daily.   omeprazole 20 MG capsule Commonly known as:  PRILOSEC Take 1 capsule (20 mg total) by mouth daily.   oxyCODONE-acetaminophen 5-325 MG tablet Commonly known as:  PERCOCET/ROXICET Take 1-2 tablets by mouth every 4 (four) hours as needed for moderate pain.   sodium chloride 0.65 % Soln nasal spray Commonly known as:  OCEAN Place 2 sprays into both nostrils as needed for congestion.   tamsulosin 0.4 MG Caps capsule Commonly known as:  FLOMAX Take 0.4 mg by mouth at bedtime.   traMADol 50 MG tablet Commonly known as:  ULTRAM Take 50-100 mg by mouth every  6 (six) hours as needed for moderate pain.   VISINE OP Place 2 drops into both eyes as needed (for dry eyes).        SignedStefani Dama: Timaya Bojarski J 02/14/2016, 10:51 AM

## 2016-02-14 NOTE — Evaluation (Signed)
Physical Therapy Evaluation Patient Details Name: Greg Flowers MRN: [id number] DOB: 12-08-1955 Today's Date: 02/14/2016   History of Present Illness  Adm for L4-L5 decompressive laminectomy decompression, posterior lumbar interbody arthrodesis with peek spacers, pedicle screw fixation L4-L5 Significant PMHx- cervical fusion, HTN, Hep C, Depression/anxiety    Clinical Impression  Patient is s/p above surgery resulting in the deficits listed below (see PT Problem List). Patient mobilizing well with RW (did not want to attempt without and with incr weight through UEs). Patient will benefit from skilled PT to increase their independence and safety with mobility (while adhering to their precautions) to allow discharge to the venue listed below.     Follow Up Recommendations No PT follow up;Supervision for mobility/OOB    Equipment Recommendations  Rolling walker with 5" wheels    Recommendations for Other Services OT consult     Precautions / Restrictions Precautions Precautions: Back Precaution Booklet Issued: Yes (comment) Precaution Comments: educated on BLT Required Braces or Orthoses: Spinal Brace Spinal Brace: Lumbar corset;Applied in sitting position (educated on technique)      Mobility  Bed Mobility Overal bed mobility: Needs Assistance Bed Mobility: Rolling;Sidelying to Sit Rolling: Supervision Sidelying to sit: Min assist       General bed mobility comments: no rail; HOB 0; vc for technique, assist to raise torso to minimize twisting  Transfers Overall transfer level: Needs assistance Equipment used: Rolling walker (2 wheeled) Transfers: Sit to/from Stand Sit to Stand: Min assist         General transfer comment: vc for use of RW  Ambulation/Gait Ambulation/Gait assistance: Min guard Ambulation Distance (Feet): 180 Feet Assistive device: Rolling walker (2 wheeled) Gait Pattern/deviations: Step-through pattern;Decreased stride length   Gait velocity  interpretation: Below normal speed for age/gender General Gait Details: vc for safe use of RW; incr reliance on UEs via RW; vc for cadence (pt very slow)  Stairs            Wheelchair Mobility    Modified Rankin (Stroke Patients Only)       Balance                                             Pertinent Vitals/Pain Pain Assessment: 0-10 Pain Score: 4  Pain Location: back Pain Descriptors / Indicators: Guarding;Sore Pain Intervention(s): Limited activity within patient's tolerance;Monitored during session;Premedicated before session;Repositioned    Home Living Family/patient expects to be discharged to:: Private residence Living Arrangements: Spouse/significant other Available Help at Discharge: Family;Available 24 hours/day Type of Home: House Home Access: Stairs to enter Entrance Stairs-Rails: None Entrance Stairs-Number of Steps: 2 Home Layout: Two level;Able to live on main level with bedroom/bathroom Home Equipment: Crutches;Adaptive equipment      Prior Function Level of Independence: Independent         Comments: retired from [employer] service     Hand Dominance        Extremity/Trunk Assessment   Upper Extremity Assessment: Overall WFL for tasks assessed           Lower Extremity Assessment: RLE deficits/detail;LLE deficits/detail RLE Deficits / Details: strength/ROM WFL LLE Deficits / Details: strength/ROM WFL  Cervical / Trunk Assessment: Normal (post-surgery; good posture)  Communication   Communication: No difficulties  Cognition Arousal/Alertness: Awake/alert Behavior During Therapy: WFL for tasks assessed/performed Overall Cognitive Status: Within Functional Limits for tasks assessed  General Comments General comments (skin integrity, edema, etc.): Wife present    Exercises     Assessment/Plan    PT Assessment Patient needs continued PT services  PT Problem List Decreased  mobility;Decreased knowledge of use of DME;Decreased knowledge of precautions;Impaired sensation;Pain          PT Treatment Interventions DME instruction;Gait training;Functional mobility training;Therapeutic activities;Patient/family education    PT Goals (Current goals can be found in the Care Plan section)  Acute Rehab PT Goals Patient Stated Goal: decr pain and return to active retirement PT Goal Formulation: With patient Time For Goal Achievement: 02/16/16 Potential to Achieve Goals: Good    Frequency Min 5X/week   Barriers to discharge        Co-evaluation               End of Session Equipment Utilized During Treatment: Gait belt;Back brace Activity Tolerance: Patient tolerated treatment well Patient left: in chair;with call bell/phone within reach;with family/visitor present           Time: 0828-0858 PT Time Calculation (min) (ACUTE ONLY): 30 min   Charges:   PT Evaluation $PT Eval Low Complexity: 1 Procedure PT Treatments $Gait Training: 8-22 mins   PT G Codes:        Cece Milhouse 02/14/2016, 9:46 AM Pager [phone]

## 2016-02-14 NOTE — Care Management Note (Signed)
Case Management Note  Patient Details  Name: Lorriane Shireerry L Segal MRN: [id number] Date of Birth: 1955/12/20  Subjective/Objective:                    Action/Plan: Pt discharging home with his wife. No f/u per PT. Pt with orders for rolling walker and 3 in 1. Jermaine with [facility] notified and will deliver the equipment to the room. Bedside RN updated.   Expected Discharge Date:                  Expected Discharge Plan:  Home/Self Care  In-House Referral:     Discharge planning Services  CM Consult  Post Acute Care Choice:  Durable Medical Equipment Choice offered to:  Patient  DME Arranged:  3-N-1, Walker rolling DME Agency:  [facility]  HH Arranged:    HH Agency:     Status of Service:  Completed, signed off  If discussed at Long Length of Stay Meetings, dates discussed:    Additional Comments:  Kermit BaloKelli F Leota Maka, RN 02/14/2016, 11:56 AM

## 2016-02-14 NOTE — Progress Notes (Signed)
Patient ID: Greg Flowers, male   DOB: 01-27-1956, 60 y.o.   MRN: [id number] Doing well ,ok for discharge today

## 2016-02-14 NOTE — Progress Notes (Signed)
Pt aware d/c order placed. Will await equipment from home health company. PRN pain medication given. Lawson RadarHeather M Docia Klar

## 2016-02-20 ENCOUNTER — Telehealth: Payer: Self-pay | Admitting: *Deleted

## 2016-02-20 NOTE — Telephone Encounter (Signed)
Per referring provider, He would like to delay the new patient appointment.  [name] spoke with the patient this morning. He had Surgery on Monday and wants to wait until after his 6-8 week recovery before he is scheduled. Andree CossHowell, Greg Dupee M, RN

## 2016-02-29 DIAGNOSIS — M4316 Spondylolisthesis, lumbar region: Secondary | ICD-10-CM | POA: Diagnosis not present

## 2016-03-11 ENCOUNTER — Other Ambulatory Visit: Payer: Self-pay | Admitting: Internal Medicine

## 2016-03-11 DIAGNOSIS — M5416 Radiculopathy, lumbar region: Secondary | ICD-10-CM

## 2016-03-30 DIAGNOSIS — F321 Major depressive disorder, single episode, moderate: Secondary | ICD-10-CM | POA: Diagnosis not present

## 2016-04-12 ENCOUNTER — Other Ambulatory Visit: Payer: Self-pay | Admitting: Internal Medicine

## 2016-04-12 DIAGNOSIS — M4316 Spondylolisthesis, lumbar region: Secondary | ICD-10-CM | POA: Diagnosis not present

## 2016-04-17 NOTE — Addendum Note (Signed)
Addended by: Dorie RankPOWERS, Kensie Susman E on: 04/17/2016 08:00 PM   Modules accepted: Orders

## 2016-04-30 ENCOUNTER — Telehealth: Payer: Self-pay | Admitting: *Deleted

## 2016-04-30 NOTE — Telephone Encounter (Signed)
RN called patient, got Generic voicemail greeting. Left message asking patient to call back regarding referral for treatment. Offered appointment of 2/7 at 10:40am with Dr Luciana Axeomer.  Asked patient to call back to confirm [phone] and for more information. Andree CossHowell, Farryn Linares M, RN

## 2016-05-01 ENCOUNTER — Ambulatory Visit: Payer: Federal, State, Local not specified - PPO | Attending: Neurological Surgery | Admitting: Physical Therapy

## 2016-05-01 ENCOUNTER — Encounter: Payer: Self-pay | Admitting: Physical Therapy

## 2016-05-01 DIAGNOSIS — M545 Low back pain, unspecified: Secondary | ICD-10-CM

## 2016-05-01 DIAGNOSIS — M6281 Muscle weakness (generalized): Secondary | ICD-10-CM

## 2016-05-01 DIAGNOSIS — R262 Difficulty in walking, not elsewhere classified: Secondary | ICD-10-CM | POA: Insufficient documentation

## 2016-05-01 NOTE — Therapy (Signed)
[facility] [address] [place], [place], [address] Phone: [phone]   Fax:  [phone]  Physical Therapy Evaluation  Patient Details  Name: Greg Flowers MRN: [id number] Date of Birth: 1955/06/29 Referring Provider: Barnett AbuHenry Elsner, MD  Encounter Date: 05/01/2016      PT End of Session - 05/01/16 1324    Visit Number 1   Number of Visits 12   Date for PT Re-Evaluation 06/29/16   Authorization Type BCBS   PT Start Time 1235   PT Stop Time 1320   PT Time Calculation (min) 45 min   Activity Tolerance Patient tolerated treatment well   Behavior During Therapy [facility] for tasks assessed/performed      Past Medical History:  Diagnosis Date  . Anxiety   . Calculus, kidney 2000   Sees Dr. Isabel CapriceGrapey, urology.   . Cervical spondylosis 2004   sp surgery by Dr. Danielle DessElsner  . Depression   . Headache(784.0)    Chronic, on vicodin 5 TID for this.   . Hepatitis C    Has occasional visits with Dr. Randa EvensEdwards GI, failed RX in 2000.  Liver Biopsy 9/06: Minimally active hepatitis consistent with hepatitis C, minimal necroinflamtory activity grade 1, no incrased fibrosis stage 0.   . Herpes labialis   . Hypertension   . Pneumonia   . Renal stone   . Spondylolisthesis of lumbar region   . Thalassemia    HGB 9/09 14.4 wtih MCV 72.6  . Wears glasses     Past Surgical History:  Procedure Laterality Date  . CERVICAL DISCECTOMY  2004   Dr Danielle DessElsner  . COLONOSCOPY    . LITHOTRIPSY  2004   Dr Logan BoresEvans  . TONSILLECTOMY      There were no vitals filed for this visit.       Subjective Assessment - 05/01/16 1245    Subjective Pt arriving to therapy complaining of low back pain following a lumbar fusion of L4-5 on 02/14/16, Pt reporting back pain at rest of 2/10. pt reporting increased back pain when standing after a nights rest. Pt reporting some difficulty with sleeping.    Limitations House hold activities   How long can you sit comfortably? 1  hour   How long can you stand comfortably? 1 hour   How long can you walk comfortably? 10-15 minutes   Patient Stated Goals Get back to PLOF prior to surgery, do my yard work   Currently in Pain? Yes   Pain Score 2    Pain Location Back   Pain Orientation Lower   Pain Descriptors / Indicators Aching   Pain Type Surgical pain   Pain Onset More than a month ago   Pain Frequency Intermittent  Constant every morning when waking   Aggravating Factors  When first waking up, walking/standing for long periods   Pain Relieving Factors over the counter pain meds, heating pad, warm baths            [facility] PT Assessment - 05/01/16 0001      Assessment   Medical Diagnosis Low back pain, s/p L4-5 fusion   Referring Provider Barnett AbuHenry Elsner, MD   Onset Date/Surgical Date 02/14/16   Hand Dominance Right   Next MD Visit 2 weeks ago   Prior Therapy yes, 15 years ago folowing cervical fusion     Precautions   Precautions Other (comment)   Precaution Comments 10 pound lifting restrictions     Restrictions   Weight Bearing Restrictions  No     Balance Screen   Has the patient fallen in the past 6 months No   Is the patient reluctant to leave their home because of a fear of falling?  No     Home Environment   Living Environment Private residence   Living Arrangements Spouse/significant other   Available Help at Discharge Family   Type of Home House   Home Access Stairs to enter   Entrance Stairs-Number of Steps 2   Entrance Stairs-Rails None   Home Layout Two level   Alternate Level Stairs-Number of Steps 16   Alternate Level Stairs-Rails Right   Home Equipment Walker - 2 wheels;Bedside commode     Prior Function   Level of Independence Independent   Vocation Retired   Leisure garden, yard work, fishing, church     Cognition   Overall Cognitive Status Within Functional Limits for tasks assessed     Observation/Other Assessments   Focus on Therapeutic Outcomes (FOTO)  43% function      Coordination   Heel Shin Test intact bilaterally     Functional Tests   Functional tests Sit to Stand     Posture/Postural Control   Posture/Postural Control Postural limitations   Postural Limitations Rounded Shoulders;Forward head     ROM / Strength   AROM / PROM / Strength AROM;Strength     AROM   AROM Assessment Site Lumbar   Lumbar Flexion 70   Lumbar Extension 20   Lumbar - Right Side Bend 20   Lumbar - Left Side Bend 20   Lumbar - Right Rotation WFL   Lumbar - Left Rotation [facility]     Strength   Strength Assessment Site Hip   Right/Left Hip Right;Left   Right Hip Flexion 5/5   Right Hip Extension 4/5   Right Hip ABduction 4/5   Right Hip ADduction 4/5   Left Hip Flexion 5/5   Left Hip ABduction 4/5   Left Hip ADduction 4/5     Transfers   Five time sit to stand comments  16.2 seconds with UE support     Ambulation/Gait   Ambulation/Gait Yes   Ambulation Distance (Feet) 50 Feet   Assistive device None   Gait Pattern Step-through pattern;Decreased trunk rotation     Balance   Balance Assessed --  R SLS: 3 seconds, L SLS 4 seconds                           PT Education - 05/01/16 1324    Education provided Yes   Education Details Estate manager/land agent, HEP   Person(s) Educated Patient   Methods Explanation;Demonstration;Handout;Verbal cues   Comprehension Verbalized understanding;Returned demonstration          PT Short Term Goals - 05/01/16 1330      PT SHORT TERM GOAL #1   Title pt will be independent with HEP   Baseline 3   Period Weeks   Status New     PT SHORT TERM GOAL #2   Title Pt will be able to amb for 10 minutes without reporting increased pain.    Baseline Pt reports amb 10 minutes causes increased pain/discomfort   Time 3   Period Weeks   Status New           PT Long Term Goals - 05/01/16 1331      PT LONG TERM GOAL #1   Title Pt will increase his FOTO score  from  43% functional  to 57% functional.     Time 6   Period Weeks   Status New     PT LONG TERM GOAL #2   Title Pt will be able to amb 20 minutes reporting no pain.    Baseline reports increased pain when amb 10 minutes   Time 6   Period Weeks   Status New     PT LONG TERM GOAL #3   Title pt will improve his bilateral LE hip strength to 5/5 throughout in order to improve functional mobility and gait.    Baseline 4/5 in bilateral hip add/abd   Time 6   Period Weeks   Status New     PT LONG TERM GOAL #4   Title Pt will be able to demonstrate correct body mechanics when lifting 10 pounds from the floor and shelf.    Time 6   Period Weeks   Status New               Plan - 05/01/16 1326    Clinical Impression Statement Pt arriving to therapy as a low complexity evalaution following a Lumbar Spinal fusion on February 14, 2016. Pt reporting mild LBP of 2/10 which varies with movements. pt reporting increased pain first thing in the the morning and difficulty sleeping. Pt reporting aches and stiffness at times with 10 pound lifting restriction. Skilled PT needed to address pt's bilateral hip weakness, decreased trunk mobility and pain.    Rehab Potential Good   PT Frequency 2x / week   PT Duration 6 weeks   PT Treatment/Interventions ADLs/Self Care Home Management;Patient/family education;Taping;Cryotherapy;Electrical Stimulation;Iontophoresis 4mg /ml Dexamethasone;Moist Heat;Ultrasound;Neuromuscular re-education;Balance training;Therapeutic exercise;Therapeutic activities;Functional mobility training;Stair training;Gait training;Manual techniques;Passive range of motion   PT Next Visit Plan Review HEP, lumbar stretching, hip strengthening   PT Home Exercise Plan bridges, hamstring stretches, hip abduction, single knee to chest   Consulted and Agree with Plan of Care Patient      Patient will benefit from skilled therapeutic intervention in order to improve the following deficits and impairments:  Pain, Decreased strength,  Decreased activity tolerance, Difficulty walking, Decreased range of motion, Impaired perceived functional ability, Decreased balance  Visit Diagnosis: Acute bilateral low back pain without sciatica  Muscle weakness (generalized)  Difficulty in walking, not elsewhere classified     Problem List Patient Active Problem List   Diagnosis Date Noted  . Spondylolisthesis at L4-L5 level 02/13/2016  . NSAID long-term use 01/09/2016  . Lumbar radiculopathy, acute 01/09/2016  . Preventative health care 02/12/2012  . Osteoarthritis of both knees 11/02/2011  . NECK AND BACK PAIN 11/03/2009  . FOOT PAIN, BILATERAL 09/20/2009  . HEADACHE 05/06/2009  . DISORDER, DEPRESSIVE NEC 09/25/2006  . HERPES LABIALIS 04/04/2006  . THALASSEMIA NEC 04/04/2006  . Chronic hepatitis C without hepatic coma (HCC) 02/22/2006  . Essential hypertension 02/22/2006  . CALCULUS, KIDNEY 02/22/2006  . SPONDYLOSIS, CERVICAL 02/22/2006  . COLONOSCOPY, HX OF 02/22/2006    Sharmon Leyden, MPT 05/01/2016, 2:55 PM  [facility] [address] [place], [place], [address] Phone: [phone]   Fax:  [phone]  Name: Greg Flowers MRN: [id number] Date of Birth: 1956-03-22

## 2016-05-02 ENCOUNTER — Ambulatory Visit: Payer: Federal, State, Local not specified - PPO | Admitting: Physical Therapy

## 2016-05-03 ENCOUNTER — Ambulatory Visit: Payer: Federal, State, Local not specified - PPO | Admitting: Physical Therapy

## 2016-05-08 ENCOUNTER — Encounter: Payer: Self-pay | Admitting: Physical Therapy

## 2016-05-08 ENCOUNTER — Ambulatory Visit: Payer: Federal, State, Local not specified - PPO | Admitting: Physical Therapy

## 2016-05-08 DIAGNOSIS — M545 Low back pain, unspecified: Secondary | ICD-10-CM

## 2016-05-08 DIAGNOSIS — R262 Difficulty in walking, not elsewhere classified: Secondary | ICD-10-CM | POA: Diagnosis not present

## 2016-05-08 DIAGNOSIS — M6281 Muscle weakness (generalized): Secondary | ICD-10-CM

## 2016-05-08 NOTE — Therapy (Signed)
[facility] [address] [place], [place], [address] Phone: [phone]   Fax:  [phone]  Physical Therapy Treatment  Patient Details  Name: Greg Flowers MRN: [id number] Date of Birth: 1955-11-02 Referring Provider: Barnett Abu, MD  Encounter Date: 05/08/2016      PT End of Session - 05/08/16 1317    Visit Number 2   Number of Visits 12   Date for PT Re-Evaluation 06/29/16   Authorization Type BCBS   PT Start Time 1240   PT Stop Time 1315   PT Time Calculation (min) 35 min   Activity Tolerance Patient tolerated treatment well   Behavior During Therapy [facility] for tasks assessed/performed      Past Medical History:  Diagnosis Date  . Anxiety   . Calculus, kidney 2000   Sees Dr. Isabel Caprice, urology.   . Cervical spondylosis 2004   sp surgery by Dr. Danielle Dess  . Depression   . Headache(784.0)    Chronic, on vicodin 5 TID for this.   . Hepatitis C    Has occasional visits with Dr. Randa Evens GI, failed RX in 2000.  Liver Biopsy 9/06: Minimally active hepatitis consistent with hepatitis C, minimal necroinflamtory activity grade 1, no incrased fibrosis stage 0.   . Herpes labialis   . Hypertension   . Pneumonia   . Renal stone   . Spondylolisthesis of lumbar region   . Thalassemia    HGB 9/09 14.4 wtih MCV 72.6  . Wears glasses     Past Surgical History:  Procedure Laterality Date  . CERVICAL DISCECTOMY  2004   Dr Danielle Dess  . COLONOSCOPY    . LITHOTRIPSY  2004   Dr Logan Bores  . TONSILLECTOMY      There were no vitals filed for this visit.      Subjective Assessment - 05/08/16 1314    Subjective Pt arriving to therapy today reporting LBP of 3/10. Pt reporting doing well with his HEP. Pt reported he is starting a walking program this evening.    Limitations House hold activities   How long can you sit comfortably? 1 hour   How long can you stand comfortably? 1 hour   How long can you walk comfortably? 10-15 minutes   Patient Stated Goals Get back to PLOF prior to surgery, do my yard work   Currently in Pain? Yes   Pain Score 3    Pain Location Back   Pain Orientation Lower;Medial   Pain Descriptors / Indicators Aching   Pain Type Surgical pain   Pain Onset More than a month ago   Pain Frequency Intermittent   Aggravating Factors  when first waking up, walking and standing for prolonged periods   Pain Relieving Factors Over the counter pain meds, heating pad, warm baths                         [facility] Adult PT Treatment/Exercise - 05/08/16 0001      Exercises   Exercises Lumbar     Lumbar Exercises: Stretches   Passive Hamstring Stretch 3 reps;30 seconds  with strap     Lumbar Exercises: Aerobic   Stationary Bike NuStep: level 3, 5 minutes     Lumbar Exercises: Supine   Bent Knee Raise 5 reps;5 seconds   Bridge 10 reps;5 seconds  ball between knees   Straight Leg Raise 10 reps;1 second   Other Supine Lumbar Exercises Double Knee to Chest with ball  between feet x 10, cueing to breath     Lumbar Exercises: Sidelying   Hip Abduction 15 reps     Manual Therapy   Manual Therapy Soft tissue mobilization   Soft tissue mobilization lumbar paraspinals                PT Education - 05/08/16 1316    Education provided Yes   Education Details Reviewed HEP   Person(s) Educated Patient   Comprehension Verbalized understanding;Returned demonstration          PT Short Term Goals - 05/08/16 1320      PT SHORT TERM GOAL #1   Title pt will be independent with HEP   Baseline 3   Period Weeks   Status On-going     PT SHORT TERM GOAL #2   Title Pt will be able to amb for 10 minutes without reporting increased pain.    Baseline Pt reports amb 10 minutes causes increased pain/discomfort   Time 3   Period Weeks   Status New           PT Long Term Goals - 05/01/16 1331      PT LONG TERM GOAL #1   Title Pt will increase his FOTO score from  43% functional  to  57% functional.    Time 6   Period Weeks   Status New     PT LONG TERM GOAL #2   Title Pt will be able to amb 20 minutes reporting no pain.    Baseline reports increased pain when amb 10 minutes   Time 6   Period Weeks   Status New     PT LONG TERM GOAL #3   Title pt will improve his bilateral LE hip strength to 5/5 throughout in order to improve functional mobility and gait.    Baseline 4/5 in bilateral hip add/abd   Time 6   Period Weeks   Status New     PT LONG TERM GOAL #4   Title Pt will be able to demonstrate correct body mechanics when lifting 10 pounds from the floor and shelf.    Time 6   Period Weeks   Status New               Plan - 05/08/16 1317    Clinical Impression Statement Pt arriving today to therapy with 3/10 pain. Pt reviewed his HEP and tolerated well new Lumbar stretches and exercises.Pt still reporting pain when first walking up in the morning, but feels the stretches are helping.  pt reported he is joining a walking program starting this evening. At end of session pt reported no pain. pt with another visit this week.    Rehab Potential Good   PT Frequency 2x / week   PT Duration 6 weeks   PT Treatment/Interventions ADLs/Self Care Home Management;Patient/family education;Taping;Cryotherapy;Electrical Stimulation;Iontophoresis 4mg /ml Dexamethasone;Moist Heat;Ultrasound;Neuromuscular re-education;Balance training;Therapeutic exercise;Therapeutic activities;Functional mobility training;Stair training;Gait training;Manual techniques;Passive range of motion   PT Next Visit Plan  lumbar stretching, hip strengthening   PT Home Exercise Plan bridges, hamstring stretches, hip abduction, single knee to chest   Consulted and Agree with Plan of Care Patient      Patient will benefit from skilled therapeutic intervention in order to improve the following deficits and impairments:  Pain, Decreased strength, Decreased activity tolerance, Difficulty walking,  Decreased range of motion, Impaired perceived functional ability, Decreased balance  Visit Diagnosis: Acute bilateral low back pain without sciatica  Muscle weakness (generalized)  Difficulty in walking, not elsewhere classified     Problem List Patient Active Problem List   Diagnosis Date Noted  . Spondylolisthesis at L4-L5 level 02/13/2016  . NSAID long-term use 01/09/2016  . Lumbar radiculopathy, acute 01/09/2016  . Preventative health care 02/12/2012  . Osteoarthritis of both knees 11/02/2011  . NECK AND BACK PAIN 11/03/2009  . FOOT PAIN, BILATERAL 09/20/2009  . HEADACHE 05/06/2009  . DISORDER, DEPRESSIVE NEC 09/25/2006  . HERPES LABIALIS 04/04/2006  . THALASSEMIA NEC 04/04/2006  . Chronic hepatitis C without hepatic coma (HCC) 02/22/2006  . Essential hypertension 02/22/2006  . CALCULUS, KIDNEY 02/22/2006  . SPONDYLOSIS, CERVICAL 02/22/2006  . COLONOSCOPY, HX OF 02/22/2006    Sharmon Leyden, MPT  05/08/2016, 1:22 PM  [facility] [address] [place], [place], [address] Phone: [phone]   Fax:  [phone]  Name: Greg Flowers MRN: [id number] Date of Birth: 10/15/1955

## 2016-05-09 NOTE — Telephone Encounter (Signed)
Patient currently in physical rehab following back surgery.  He will call to schedule his HepC appointment when he is finished with his physical therapy. Andree CossHowell, Kazi Montoro M, RN

## 2016-05-10 ENCOUNTER — Encounter: Payer: Self-pay | Admitting: Physical Therapy

## 2016-05-10 ENCOUNTER — Ambulatory Visit: Payer: Federal, State, Local not specified - PPO | Admitting: Physical Therapy

## 2016-05-10 DIAGNOSIS — R262 Difficulty in walking, not elsewhere classified: Secondary | ICD-10-CM | POA: Diagnosis not present

## 2016-05-10 DIAGNOSIS — M545 Low back pain, unspecified: Secondary | ICD-10-CM

## 2016-05-10 DIAGNOSIS — M6281 Muscle weakness (generalized): Secondary | ICD-10-CM | POA: Diagnosis not present

## 2016-05-10 NOTE — Therapy (Signed)
[facility] [address] [place], [place], [address] Phone: [phone]   Fax:  [phone]  Physical Therapy Treatment  Patient Details  Name: Greg Flowers MRN: [id number] Date of Birth: Jan 26, 1956 Referring Provider: Barnett Abu, MD  Encounter Date: 05/10/2016      PT End of Session - 05/10/16 1248    Visit Number 3   Number of Visits 12   Date for PT Re-Evaluation 06/29/16   Authorization Type BCBS   PT Start Time 1238   PT Stop Time 1320   PT Time Calculation (min) 42 min   Activity Tolerance Patient tolerated treatment well   Behavior During Therapy [facility] for tasks assessed/performed      Past Medical History:  Diagnosis Date  . Anxiety   . Calculus, kidney 2000   Sees Dr. Isabel Caprice, urology.   . Cervical spondylosis 2004   sp surgery by Dr. Danielle Dess  . Depression   . Headache(784.0)    Chronic, on vicodin 5 TID for this.   . Hepatitis C    Has occasional visits with Dr. Randa Evens GI, failed RX in 2000.  Liver Biopsy 9/06: Minimally active hepatitis consistent with hepatitis C, minimal necroinflamtory activity grade 1, no incrased fibrosis stage 0.   . Herpes labialis   . Hypertension   . Pneumonia   . Renal stone   . Spondylolisthesis of lumbar region   . Thalassemia    HGB 9/09 14.4 wtih MCV 72.6  . Wears glasses     Past Surgical History:  Procedure Laterality Date  . CERVICAL DISCECTOMY  2004   Dr Danielle Dess  . COLONOSCOPY    . LITHOTRIPSY  2004   Dr Logan Bores  . TONSILLECTOMY      There were no vitals filed for this visit.                       [facility] Adult PT Treatment/Exercise - 05/10/16 0001      Exercises   Exercises Lumbar     Lumbar Exercises: Stretches   Active Hamstring Stretch 3 reps;30 seconds  sitting at edge of mat     Lumbar Exercises: Standing   Heel Raises 10 reps   Wall Slides 10 reps   Other Standing Lumbar Exercises Calf raises, hip extension, hamstring curls all  x 10 with bilateral UE support     Lumbar Exercises: Supine   Bent Knee Raise 5 reps;5 seconds   Bridge 10 reps;5 seconds  ball between knees   Straight Leg Raise 10 reps;1 second   Other Supine Lumbar Exercises Double Knee to Chest with ball between feet x 10, cueing to breath     Lumbar Exercises: Sidelying   Hip Abduction 15 reps     Lumbar Exercises: Quadruped   Madcat/Old Horse 10 reps  holding 5 seconds   Opposite Arm/Leg Raise 5 reps;5 seconds   Opposite Arm/Leg Raise Limitations needed tactile and verbal cues for alignment and to prevent pelvis from rotating                PT Education - 05/10/16 1248    Education provided Yes   Education Details Reviewed form with current exercises added new exercises to HEP   Person(s) Educated Patient   Methods Explanation;Tactile cues;Verbal cues;Handout;Demonstration   Comprehension Verbalized understanding;Returned demonstration;Verbal cues required          PT Short Term Goals - 05/10/16 1254      PT SHORT TERM  GOAL #1   Title pt will be independent with HEP   Baseline 3   Period Weeks   Status On-going     PT SHORT TERM GOAL #2   Title Pt will be able to amb for 10 minutes without reporting increased pain.    Baseline Pt reports amb 10 minutes causes increased pain/discomfort   Time 3   Period Weeks   Status New           PT Long Term Goals - 05/10/16 1301      PT LONG TERM GOAL #1   Title Pt will increase his FOTO score from  43% functional  to 57% functional.    Time 6   Period Weeks   Status New     PT LONG TERM GOAL #2   Baseline reports increased pain when amb 10 minutes   Time 6   Period Weeks   Status New     PT LONG TERM GOAL #3   Title pt will improve his bilateral LE hip strength to 5/5 throughout in order to improve functional mobility and gait.    Baseline 4/5 in bilateral hip add/abd   Time 6   Period Weeks   Status New               Plan - 05/10/16 1249    Clinical  Impression Statement Pt arriving to therapy complaining of 2/10 low back pain. pt edu in proper form with his current exercises and throughout treatment. pt reminded throughout treatment to breathe. pt reporting no pain at end of session. Still progressing toward all goals set.    Rehab Potential Good   PT Frequency 2x / week   PT Duration 6 weeks   PT Treatment/Interventions ADLs/Self Care Home Management;Patient/family education;Taping;Cryotherapy;Electrical Stimulation;Iontophoresis 4mg /ml Dexamethasone;Moist Heat;Ultrasound;Neuromuscular re-education;Balance training;Therapeutic exercise;Therapeutic activities;Functional mobility training;Stair training;Gait training;Manual techniques;Passive range of motion   PT Next Visit Plan  lumbar stretching, hip strengthening, continue with quadraped exercises, machines as pt tolerates   PT Home Exercise Plan bridges, hamstring stretches, hip abduction, single knee to chest, standing hip ext, abd, hamstring curls, calf raises   Consulted and Agree with Plan of Care Patient      Patient will benefit from skilled therapeutic intervention in order to improve the following deficits and impairments:  Pain, Decreased strength, Decreased activity tolerance, Difficulty walking, Decreased range of motion, Impaired perceived functional ability, Decreased balance  Visit Diagnosis: Acute bilateral low back pain without sciatica  Muscle weakness (generalized)  Difficulty in walking, not elsewhere classified     Problem List Patient Active Problem List   Diagnosis Date Noted  . Spondylolisthesis at L4-L5 level 02/13/2016  . NSAID long-term use 01/09/2016  . Lumbar radiculopathy, acute 01/09/2016  . Preventative health care 02/12/2012  . Osteoarthritis of both knees 11/02/2011  . NECK AND BACK PAIN 11/03/2009  . FOOT PAIN, BILATERAL 09/20/2009  . HEADACHE 05/06/2009  . DISORDER, DEPRESSIVE NEC 09/25/2006  . HERPES LABIALIS 04/04/2006  . THALASSEMIA  NEC 04/04/2006  . Chronic hepatitis C without hepatic coma (HCC) 02/22/2006  . Essential hypertension 02/22/2006  . CALCULUS, KIDNEY 02/22/2006  . SPONDYLOSIS, CERVICAL 02/22/2006  . COLONOSCOPY, HX OF 02/22/2006    Sharmon LeydenJennifer R Kelie Gainey, MPT  05/10/2016, 1:38 PM  [facility] [address] [place], [place], [address] Phone: [phone]   Fax:  [phone]  Name: Greg Flowers MRN: [id number] Date of Birth: 10-29-1955

## 2016-05-15 ENCOUNTER — Ambulatory Visit: Payer: Federal, State, Local not specified - PPO | Admitting: Physical Therapy

## 2016-05-15 DIAGNOSIS — M6281 Muscle weakness (generalized): Secondary | ICD-10-CM | POA: Diagnosis not present

## 2016-05-15 DIAGNOSIS — M545 Low back pain, unspecified: Secondary | ICD-10-CM

## 2016-05-15 DIAGNOSIS — R262 Difficulty in walking, not elsewhere classified: Secondary | ICD-10-CM

## 2016-05-15 NOTE — Patient Instructions (Signed)

## 2016-05-15 NOTE — Therapy (Signed)
[place], [place], [address] Phone: [phone]   Fax:  [phone]  Physical Therapy Treatment  Patient Details  Name: AKSHITH MONCUS MRN: [id number] Date of Birth: 05/06/1955 Referring Provider: Kristeen Miss, MD  Encounter Date: 05/15/2016      PT End of Session - 05/15/16 1250    Visit Number 4   Number of Visits 12   Date for PT Re-Evaluation 06/29/16   PT Start Time 5093   PT Stop Time 2671   PT Time Calculation (min) 58 min   Activity Tolerance Patient tolerated treatment well;No increased pain   Behavior During Therapy [facility] for tasks assessed/performed      Past Medical History:  Diagnosis Date  . Anxiety   . Calculus, kidney 2000   Sees Dr. Risa Grill, urology.   . Cervical spondylosis 2004   sp surgery by Dr. Ellene Route  . Depression   . Headache(784.0)    Chronic, on vicodin 5 TID for this.   . Hepatitis C    Has occasional visits with Dr. Oletta Lamas GI, failed RX in 2000.  Liver Biopsy 9/06: Minimally active hepatitis consistent with hepatitis C, minimal necroinflamtory activity grade 1, no incrased fibrosis stage 0.   . Herpes labialis   . Hypertension   . Pneumonia   . Renal stone   . Spondylolisthesis of lumbar region   . Thalassemia    HGB 9/09 14.4 wtih MCV 72.6  . Wears glasses     Past Surgical History:  Procedure Laterality Date  . CERVICAL DISCECTOMY  2004   Dr Ellene Route  . COLONOSCOPY    . LITHOTRIPSY  2004   Dr Amalia Hailey  . TONSILLECTOMY      There were no vitals filed for this visit.      Subjective Assessment - 05/15/16 1153    Subjective No back pain now.  last pain on Sat when he was moving some boxes.  It has been awhile since he has had back pain.   Currently in Pain? No/denies   Pain Location Back   Pain Orientation Lower   Pain Frequency Intermittent   Aggravating Factors  lifting boxes  20-25 LBS   Pain Relieving Factors tylenol   Multiple Pain Sites No                          [facility] Adult PT Treatment/Exercise - 05/15/16 0001      Self-Care   Self-Care Lifting   Lifting large red ball from mat, from floor with education, patient was able to do correctly     Lumbar Exercises: Stretches   Active Hamstring Stretch 3 reps;30 seconds  strap   Passive Hamstring Stretch --  gastroc stretch 3 X 10 on incline board     Lumbar Exercises: Aerobic   Stationary Bike Nustep L5      Lumbar Exercises: Standing   Heel Raises 10 reps   Heel Raises Limitations Tip toe40 feet in clinic.  patient noted thi was moderately hard.    Wall Slides 10 reps  cued to press heels more than toes   Other Standing Lumbar Exercises , hip extension, hamstring curls all x 10 with bilateral UE support  cued to avoid locking knee in extension with weightbearing     Lumbar Exercises: Supine   Bent Knee Raise 5 reps   Bent Knee Raise Limitations cues for stabilization   Bridge 15 reps   Other Supine  Lumbar Exercises Double Knee to Chest with ball between feet x 10, Monitored for neutral spine     Moist Heat Therapy   Number Minutes Moist Heat 10 Minutes   Moist Heat Location Lumbar Spine  prone                PT Education - 05/15/16 1250    Education provided Yes   Education Details lifting   Person(s) Educated Patient   Methods Explanation;Demonstration;Verbal cues;Handout   Comprehension Verbalized understanding;Returned demonstration          PT Short Term Goals - 05/15/16 1254      PT SHORT TERM GOAL #1   Title pt will be independent with HEP   Baseline 3   Period Weeks   Status On-going     PT SHORT TERM GOAL #2   Title Pt will be able to amb for 10 minutes without reporting increased pain.    Baseline Able to walk without pain 2 miles   Time 3   Period Weeks   Status Achieved           PT Long Term Goals - 05/10/16 1301      PT LONG TERM GOAL #1   Title Pt will increase his FOTO score from  43% functional   to 57% functional.    Time 6   Period Weeks   Status New     PT LONG TERM GOAL #2   Baseline reports increased pain when amb 10 minutes   Time 6   Period Weeks   Status New     PT LONG TERM GOAL #3   Title pt will improve his bilateral LE hip strength to 5/5 throughout in order to improve functional mobility and gait.    Baseline 4/5 in bilateral hip add/abd   Time 6   Period Weeks   Status New               Plan - 05/15/16 1250    Clinical Impression Statement No pain since last Sat when he was moving some boxes.  Patient now able to walk without pain.  he has joined a walking group at his church and walks every tuesday.  He was able to walk 2 miles .STG#2 met.   PT Next Visit Plan  lumbar stretching, hip strengthening, continue with quadraped exercises, machines as pt tolerates.  Answer any body mechanics questions (Lifting only reviewed today)   PT Home Exercise Plan bridges, hamstring stretches, hip abduction, single knee to chest, standing hip ext, abd, hamstring curls, calf raises      Patient will benefit from skilled therapeutic intervention in order to improve the following deficits and impairments:  Pain, Decreased strength, Decreased activity tolerance, Difficulty walking, Decreased range of motion, Impaired perceived functional ability, Decreased balance  Visit Diagnosis: Acute bilateral low back pain without sciatica  Muscle weakness (generalized)  Difficulty in walking, not elsewhere classified     Problem List Patient Active Problem List   Diagnosis Date Noted  . Spondylolisthesis at L4-L5 level 02/13/2016  . NSAID long-term use 01/09/2016  . Lumbar radiculopathy, acute 01/09/2016  . Preventative health care 02/12/2012  . Osteoarthritis of both knees 11/02/2011  . NECK AND BACK PAIN 11/03/2009  . FOOT PAIN, BILATERAL 09/20/2009  . HEADACHE 05/06/2009  . DISORDER, DEPRESSIVE NEC 09/25/2006  . HERPES LABIALIS 04/04/2006  . THALASSEMIA NEC  04/04/2006  . Chronic hepatitis C without hepatic coma ([place]) 02/22/2006  . Essential hypertension 02/22/2006  .  CALCULUS, KIDNEY 02/22/2006  . SPONDYLOSIS, CERVICAL 02/22/2006  . COLONOSCOPY, HX OF 02/22/2006    HARRIS,KAREN PTA 05/15/2016, 12:57 PM  [facility] [address] [place], [place], [address] Phone: [phone]   Fax:  [phone]  Name: BRAHM BARBEAU MRN: [id number] Date of Birth: 04/06/1956

## 2016-05-17 ENCOUNTER — Ambulatory Visit: Payer: Federal, State, Local not specified - PPO | Attending: Neurological Surgery

## 2016-05-17 DIAGNOSIS — M545 Low back pain, unspecified: Secondary | ICD-10-CM

## 2016-05-17 DIAGNOSIS — M6281 Muscle weakness (generalized): Secondary | ICD-10-CM | POA: Insufficient documentation

## 2016-05-17 DIAGNOSIS — R262 Difficulty in walking, not elsewhere classified: Secondary | ICD-10-CM | POA: Diagnosis not present

## 2016-05-17 NOTE — Therapy (Signed)
[facility] [address] [place], [place], [address] Phone: [phone]   Fax:  [phone]  Physical Therapy Treatment  Patient Details  Name: SAMIR ISHAQ MRN: [id number] Date of Birth: [birth year] Referring Provider: Barnett Abu, MD  Encounter Date: 05/17/2016      PT End of Session - 05/17/16 1235    Visit Number 5   Number of Visits 12   Date for PT Re-Evaluation 06/29/16   Authorization Type BCBS   PT Start Time 1235   PT Stop Time 1330   PT Time Calculation (min) 55 min   Activity Tolerance Patient tolerated treatment well   Behavior During Therapy [facility] for tasks assessed/performed      Past Medical History:  Diagnosis Date  . Anxiety   . Calculus, kidney 2000   Sees Dr. Isabel Caprice, urology.   . Cervical spondylosis 2004   sp surgery by Dr. Danielle Dess  . Depression   . Headache(784.0)    Chronic, on vicodin 5 TID for this.   . Hepatitis C    Has occasional visits with Dr. Randa Evens GI, failed RX in 2000.  Liver Biopsy 9/06: Minimally active hepatitis consistent with hepatitis C, minimal necroinflamtory activity grade 1, no incrased fibrosis stage 0.   . Herpes labialis   . Hypertension   . Pneumonia   . Renal stone   . Spondylolisthesis of lumbar region   . Thalassemia    HGB 9/09 14.4 wtih MCV 72.6  . Wears glasses     Past Surgical History:  Procedure Laterality Date  . CERVICAL DISCECTOMY  2004   Dr Danielle Dess  . COLONOSCOPY    . LITHOTRIPSY  2004   Dr Logan Bores  . TONSILLECTOMY      There were no vitals filed for this visit.      Subjective Assessment - 05/17/16 1240    Subjective MAy have slept wrong 1-2/10 now   Currently in Pain? Yes   Pain Score 2    Pain Location Back   Pain Orientation Lower   Pain Descriptors / Indicators Aching   Pain Type Surgical pain   Pain Onset More than a month ago   Pain Frequency Intermittent   Aggravating Factors  lifting    Pain Relieving Factors tylenol   Multiple Pain Sites No                         [facility] Adult PT Treatment/Exercise - 05/17/16 0001      Lumbar Exercises: Aerobic   Stationary Bike Nustep L6  UE/LE 6 min     Lumbar Exercises: Standing   Heel Raises Limitations Tip toe40 feet in clinic.    Wall Slides 10 reps  10 sec   Other Standing Lumbar Exercises , hip extension and hip abduction x8 RT and LT ,with bilateral UE support on pole  with hip flexion before  leg movement   Other Standing Lumbar Exercises Stand rows and shoulder extension  red bands x 12 stanidng on blue foam     Lumbar Exercises: Seated   Sit to Stand 15 reps   Sit to Stand Limitations with pole for back posture. Discussed use of pole to practice turning with straight spine along with sit to stand     Lumbar Exercises: Supine   Bent Knee Raise 10 reps   Bent Knee Raise Limitations cues for stabilization   Bridge 15 reps   Other Supine Lumbar Exercises Double Knee to  Chest with ball between feet x 10, x5 Monitored for neutral spine     Lumbar Exercises: Quadruped   Madcat/Old Horse 10 reps   Opposite Arm/Leg Raise --     Moist Heat Therapy   Number Minutes Moist Heat 10 Minutes   Moist Heat Location Lumbar Spine                  PT Short Term Goals - 05/15/16 1254      PT SHORT TERM GOAL #1   Title pt will be independent with HEP   Baseline 3   Period Weeks   Status On-going     PT SHORT TERM GOAL #2   Title Pt will be able to amb for 10 minutes without reporting increased pain.    Baseline Able to walk without pain 2 miles   Time 3   Period Weeks   Status Achieved           PT Long Term Goals - 05/10/16 1301      PT LONG TERM GOAL #1   Title Pt will increase his FOTO score from  43% functional  to 57% functional.    Time 6   Period Weeks   Status New     PT LONG TERM GOAL #2   Baseline reports increased pain when amb 10 minutes   Time 6   Period Weeks   Status New     PT LONG TERM GOAL #3    Title pt will improve his bilateral LE hip strength to 5/5 throughout in order to improve functional mobility and gait.    Baseline 4/5 in bilateral hip add/abd   Time 6   Period Weeks   Status New               Plan - 05/17/16 1242    Clinical Impression Statement Mild pain today but did not limit activity.  Mild tightness post exercise pt reporting he felt like he worked his back. no incr pain   PT Treatment/Interventions ADLs/Self Care Home Management;Patient/family education;Taping;Cryotherapy;Electrical Stimulation;Iontophoresis 4mg /ml Dexamethasone;Moist Heat;Ultrasound;Neuromuscular re-education;Balance training;Therapeutic exercise;Therapeutic activities;Functional mobility training;Stair training;Gait training;Manual techniques;Passive range of motion   PT Next Visit Plan  lumbar stretching, hip strengthening, continue with quadraped exercises, machines as pt tolerates.  Answer any body mechanics questions (sit to stand with pole only reviewed today)   PT Home Exercise Plan bridges, hamstring stretches, hip abduction, single knee to chest, standing hip ext, abd, hamstring curls, calf raises   Consulted and Agree with Plan of Care Patient      Patient will benefit from skilled therapeutic intervention in order to improve the following deficits and impairments:  Pain, Decreased strength, Decreased activity tolerance, Difficulty walking, Decreased range of motion, Impaired perceived functional ability, Decreased balance  Visit Diagnosis: Acute bilateral low back pain without sciatica  Muscle weakness (generalized)  Difficulty in walking, not elsewhere classified     Problem List Patient Active Problem List   Diagnosis Date Noted  . Spondylolisthesis at L4-L5 level 02/13/2016  . NSAID long-term use 01/09/2016  . Lumbar radiculopathy, acute 01/09/2016  . Preventative health care 02/12/2012  . Osteoarthritis of both knees 11/02/2011  . NECK AND BACK PAIN 11/03/2009  .  FOOT PAIN, BILATERAL 09/20/2009  . HEADACHE 05/06/2009  . DISORDER, DEPRESSIVE NEC 09/25/2006  . HERPES LABIALIS 04/04/2006  . THALASSEMIA NEC 04/04/2006  . Chronic hepatitis C without hepatic coma (HCC) 02/22/2006  . Essential hypertension 02/22/2006  . CALCULUS, KIDNEY 02/22/2006  .  SPONDYLOSIS, CERVICAL 02/22/2006  . COLONOSCOPY, HX OF 02/22/2006    Caprice RedChasse, Tj Kitchings M  PT 05/17/2016, 1:20 PM  [facility] [address] [place], [place], [address] Phone: [phone]   Fax:  [phone]  Name: Lorriane Shireerry L Getty MRN: [id number] Date of Birth: 18-May-1955

## 2016-05-23 ENCOUNTER — Encounter: Payer: Federal, State, Local not specified - PPO | Admitting: Internal Medicine

## 2016-05-29 ENCOUNTER — Ambulatory Visit: Payer: Federal, State, Local not specified - PPO | Admitting: Physical Therapy

## 2016-05-29 DIAGNOSIS — M545 Low back pain, unspecified: Secondary | ICD-10-CM

## 2016-05-29 DIAGNOSIS — R262 Difficulty in walking, not elsewhere classified: Secondary | ICD-10-CM | POA: Diagnosis not present

## 2016-05-29 DIAGNOSIS — M6281 Muscle weakness (generalized): Secondary | ICD-10-CM

## 2016-05-29 NOTE — Therapy (Signed)
[facility] [address] [place], [place], [address] Phone: [phone]   Fax:  [phone]  Physical Therapy Treatment  Patient Details  Name: PASTOR SGRO MRN: [id number] Date of Birth: 09-28-1955 Referring Provider: Barnett Abu, MD  Encounter Date: 05/29/2016      PT End of Session - 05/29/16 1319    Visit Number 5   Number of Visits 12   Date for PT Re-Evaluation 06/29/16   PT Start Time 1145   PT Stop Time 1245   PT Time Calculation (min) 60 min   Activity Tolerance Patient tolerated treatment well   Behavior During Therapy [facility] for tasks assessed/performed      Past Medical History:  Diagnosis Date  . Anxiety   . Calculus, kidney 2000   Sees Dr. Isabel Caprice, urology.   . Cervical spondylosis 2004   sp surgery by Dr. Danielle Dess  . Depression   . Headache(784.0)    Chronic, on vicodin 5 TID for this.   . Hepatitis C    Has occasional visits with Dr. Randa Evens GI, failed RX in 2000.  Liver Biopsy 9/06: Minimally active hepatitis consistent with hepatitis C, minimal necroinflamtory activity grade 1, no incrased fibrosis stage 0.   . Herpes labialis   . Hypertension   . Pneumonia   . Renal stone   . Spondylolisthesis of lumbar region   . Thalassemia    HGB 9/09 14.4 wtih MCV 72.6  . Wears glasses     Past Surgical History:  Procedure Laterality Date  . CERVICAL DISCECTOMY  2004   Dr Danielle Dess  . COLONOSCOPY    . LITHOTRIPSY  2004   Dr Logan Bores  . TONSILLECTOMY      There were no vitals filed for this visit.      Subjective Assessment - 05/29/16 1159    Subjective Pain is 1-2/10.  Some days he does not have pain.  I'll need to use the blower, cut some hedges.  and spread fertilizer 30-40 pound bag.  I get catches with getting up and dowm   Currently in Pain? Yes   Pain Score 2    Pain Location Back   Pain Orientation Lower   Pain Descriptors / Indicators Aching  catches   Pain Frequency Intermittent   Aggravating Factors  getting up and down   Pain Relieving Factors tylenol,  rest,  change of position,                         [facility] Adult PT Treatment/Exercise - 05/29/16 0001      Lumbar Exercises: Stretches   Passive Hamstring Stretch 3 reps;30 seconds     Lumbar Exercises: Aerobic   Stationary Bike Nustep L6  UE/LE 7 min     Lumbar Exercises: Standing   Other Standing Lumbar Exercises overhead reach     Lumbar Exercises: Supine   Bridge 15 reps   Other Supine Lumbar Exercises decompression 10 x 5 seconds each leg press, leg lengthener, shoulder press and head press 10 x 5 seconds each.  cues for technique,  1 pillow under legs to ease strain.   Other Supine Lumbar Exercises leg lengthener, 10 x 5 seconds,  leg press 10 X 5 seconds,  1 pillow under right.  right leg only  shoulder press  10 x 5 seconds  good morning stretch 5 X 10 seconds legs on 1 pillow,     Shoulder Exercises: Lawyer  Limitations doorway stretch for posture. 3 X 30 seconds,  Tried latissimus stretch at wall , unable to keep arm in contact. with wall.     Moist Heat Therapy   Number Minutes Moist Heat 15 Minutes  prone   Moist Heat Location Lumbar Spine                  PT Short Term Goals - 05/15/16 1254      PT SHORT TERM GOAL #1   Title pt will be independent with HEP   Baseline 3   Period Weeks   Status On-going     PT SHORT TERM GOAL #2   Title Pt will be able to amb for 10 minutes without reporting increased pain.    Baseline Able to walk without pain 2 miles   Time 3   Period Weeks   Status Achieved           PT Long Term Goals - 05/10/16 1301      PT LONG TERM GOAL #1   Title Pt will increase his FOTO score from  43% functional  to 57% functional.    Time 6   Period Weeks   Status New     PT LONG TERM GOAL #2   Baseline reports increased pain when amb 10 minutes   Time 6   Period Weeks   Status New     PT LONG TERM GOAL #3    Title pt will improve his bilateral LE hip strength to 5/5 throughout in order to improve functional mobility and gait.    Baseline 4/5 in bilateral hip add/abd   Time 6   Period Weeks   Status New               Plan - 05/29/16 1320    Clinical Impression Statement Pain mainly in the morning when he gets out of bed, it catches.  Stabilization and stretching focus of today's session,  He is doing his home exercises program.  No new objective findings noted.    PT Next Visit Plan  lumbar stretching, hip strengthening, continue with quadraped exercises, machines as pt tolerates.  Answer any body mechanics questions (sit to stand with pole only reviewed today)consider door way stretch/ decompression exercises.  Check specific goals.   PT Home Exercise Plan bridges, hamstring stretches, hip abduction, single knee to chest, standing hip ext, abd, hamstring curls, calf raises   Consulted and Agree with Plan of Care Patient      Patient will benefit from skilled therapeutic intervention in order to improve the following deficits and impairments:  Pain, Decreased strength, Decreased activity tolerance, Difficulty walking, Decreased range of motion, Impaired perceived functional ability, Decreased balance  Visit Diagnosis: Acute bilateral low back pain without sciatica  Muscle weakness (generalized)  Difficulty in walking, not elsewhere classified     Problem List Patient Active Problem List   Diagnosis Date Noted  . Spondylolisthesis at L4-L5 level 02/13/2016  . NSAID long-term use 01/09/2016  . Lumbar radiculopathy, acute 01/09/2016  . Preventative health care 02/12/2012  . Osteoarthritis of both knees 11/02/2011  . NECK AND BACK PAIN 11/03/2009  . FOOT PAIN, BILATERAL 09/20/2009  . HEADACHE 05/06/2009  . DISORDER, DEPRESSIVE NEC 09/25/2006  . HERPES LABIALIS 04/04/2006  . THALASSEMIA NEC 04/04/2006  . Chronic hepatitis C without hepatic coma (HCC) 02/22/2006  . Essential  hypertension 02/22/2006  . CALCULUS, KIDNEY 02/22/2006  . SPONDYLOSIS, CERVICAL 02/22/2006  . COLONOSCOPY, HX OF 02/22/2006  HARRIS,KAREN PTA 05/29/2016, 1:23 PM  [facility] [address] [place], [place], [address] Phone: [phone]   Fax:  [phone]  Name: Lorriane Shireerry L Bartko MRN: [id number] Date of Birth: 1955/05/25

## 2016-05-31 ENCOUNTER — Ambulatory Visit: Payer: Federal, State, Local not specified - PPO

## 2016-05-31 DIAGNOSIS — M545 Low back pain, unspecified: Secondary | ICD-10-CM

## 2016-05-31 DIAGNOSIS — R262 Difficulty in walking, not elsewhere classified: Secondary | ICD-10-CM | POA: Diagnosis not present

## 2016-05-31 DIAGNOSIS — M6281 Muscle weakness (generalized): Secondary | ICD-10-CM | POA: Diagnosis not present

## 2016-05-31 NOTE — Therapy (Signed)
[place], [place], [address] Phone: [phone]   Fax:  [phone]  Physical Therapy Treatment  Patient Details  Name: Greg Flowers MRN: [id number] Date of Birth: 09/07/1955 Referring Provider: Kristeen Miss, MD  Encounter Date: 05/31/2016    Past Medical History:  Diagnosis Date  . Anxiety   . Calculus, kidney 2000   Sees Dr. Risa Grill, urology.   . Cervical spondylosis 2004   sp surgery by Dr. Ellene Route  . Depression   . Headache(784.0)    Chronic, on vicodin 5 TID for this.   . Hepatitis C    Has occasional visits with Dr. Oletta Lamas GI, failed RX in 2000.  Liver Biopsy 9/06: Minimally active hepatitis consistent with hepatitis C, minimal necroinflamtory activity grade 1, no incrased fibrosis stage 0.   . Herpes labialis   . Hypertension   . Pneumonia   . Renal stone   . Spondylolisthesis of lumbar region   . Thalassemia    HGB 9/09 14.4 wtih MCV 72.6  . Wears glasses     Past Surgical History:  Procedure Laterality Date  . CERVICAL DISCECTOMY  2004   Dr Ellene Route  . COLONOSCOPY    . LITHOTRIPSY  2004   Dr Amalia Hailey  . TONSILLECTOMY      There were no vitals filed for this visit.      Subjective Assessment - 05/31/16 1242    Subjective He feels he is doing better .   stretching muscles out.    Currently in Pain? Yes   Pain Score 1    Pain Location Back   Pain Orientation Lower   Pain Descriptors / Indicators Aching   Pain Type Surgical pain   Pain Onset More than a month ago   Pain Frequency Intermittent   Aggravating Factors  transintional movements   Pain Relieving Factors med , rest positon change   Multiple Pain Sites No                         [facility] Adult PT Treatment/Exercise - 05/31/16 0001      Lumbar Exercises: Aerobic   Stationary Bike Nustep L6  UE/LE 6 min     Lumbar Exercises: Standing   Wall Slides 15 reps;5 seconds   Other Standing Lumbar Exercises ,  hip extension and hip abduction x10 RT and LT ,with bilateral UE support on pole  with hip flexion before  leg movement  and single leg hip hinge cue for posture and discussessed sit to stand from higher height to minimize soreness or pain   Other Standing Lumbar Exercises blue band side stand horizontal adduction and abduction x 12 reps RT and LT  for stabiliazatoion, then bilateral carry 5 pounds x 2 around gym then 10 pounds around gym     Moist Heat Therapy   Number Minutes Moist Heat 15 Minutes   Moist Heat Location Lumbar Spine                  PT Short Term Goals - 05/31/16 1241      PT SHORT TERM GOAL #1   Title pt will be independent with HEP   Status Achieved     PT SHORT TERM GOAL #2   Baseline Able to walk without pain 2 miles   Status Achieved           PT Long Term Goals - 05/31/16 1241  PT LONG TERM GOAL #1   Title Pt will increase his FOTO score from  43% functional  to 57% functional.    Status Unable to assess     PT LONG TERM GOAL #2   Title Pt will be able to amb 20 minutes reporting no pain.    Status Achieved     PT LONG TERM GOAL #3   Title pt will improve his bilateral LE hip strength to 5/5 throughout in order to improve functional mobility and gait.    Status On-going     PT LONG TERM GOAL #4   Title Pt will be able to demonstrate correct body mechanics when lifting 10 pounds from the floor and shelf.    Baseline He is able to keep spine in good postion lifting from mat and sit to stand. .    Status Partially Met               Plan - 05/31/16 1238    Clinical Impression Statement He is manageing to perform exercises with little to no incr pain. Walking patten looks better than original pattern with smoother gait , less trunk flexion  Progressing toward goals   PT Treatment/Interventions ADLs/Self Care Home Management;Patient/family education;Taping;Cryotherapy;Electrical Stimulation;Iontophoresis 64m/ml Dexamethasone;Moist  Heat;Ultrasound;Neuromuscular re-education;Balance training;Therapeutic exercise;Therapeutic activities;Functional mobility training;Stair training;Gait training;Manual techniques;Passive range of motion   PT Next Visit Plan  lumbar stretching, hip strengthening, continue with quadraped exercises, machines as pt tolerates.  lifting from below knee height.  Answer any body mechanics questions. Consider door way stretch/ decompression exercises.  .   PT Home Exercise Plan bridges, hamstring stretches, hip abduction, single knee to chest, standing hip ext, abd, hamstring curls, calf raises   Consulted and Agree with Plan of Care Patient      Patient will benefit from skilled therapeutic intervention in order to improve the following deficits and impairments:  Pain, Decreased strength, Decreased activity tolerance, Difficulty walking, Decreased range of motion, Impaired perceived functional ability, Decreased balance  Visit Diagnosis: Acute bilateral low back pain without sciatica  Muscle weakness (generalized)  Difficulty in walking, not elsewhere classified     Problem List Patient Active Problem List   Diagnosis Date Noted  . Spondylolisthesis at L4-L5 level 02/13/2016  . NSAID long-term use 01/09/2016  . Lumbar radiculopathy, acute 01/09/2016  . Preventative health care 02/12/2012  . Osteoarthritis of both knees 11/02/2011  . NECK AND BACK PAIN 11/03/2009  . FOOT PAIN, BILATERAL 09/20/2009  . HEADACHE 05/06/2009  . DISORDER, DEPRESSIVE NEC 09/25/2006  . HERPES LABIALIS 04/04/2006  . THALASSEMIA NEC 04/04/2006  . Chronic hepatitis C without hepatic coma ([place] 02/22/2006  . Essential hypertension 02/22/2006  . CALCULUS, KIDNEY 02/22/2006  . SPONDYLOSIS, CERVICAL 02/22/2006  . COLONOSCOPY, HX OF 02/22/2006    CDarrel Hoover PT 05/31/2016, 1:50 PM  [facility] [address]Phone: [phone]   Fax:  [phone] Name: TMEHRAN GUDERIANMRN: [id number] of Birth: 9Jan 13, 1957

## 2016-06-05 ENCOUNTER — Ambulatory Visit: Payer: Federal, State, Local not specified - PPO

## 2016-06-05 DIAGNOSIS — M545 Low back pain, unspecified: Secondary | ICD-10-CM

## 2016-06-05 DIAGNOSIS — M6281 Muscle weakness (generalized): Secondary | ICD-10-CM | POA: Diagnosis not present

## 2016-06-05 DIAGNOSIS — R262 Difficulty in walking, not elsewhere classified: Secondary | ICD-10-CM

## 2016-06-05 NOTE — Patient Instructions (Signed)
Lifting mechanics with squat lift and with dead lift and how this can impact back and/or knees. He did well with this

## 2016-06-05 NOTE — Therapy (Signed)
[place], [place], [address] Phone: [phone]   Fax:  [phone]  Physical Therapy Treatment  Patient Details  Name: Greg Flowers MRN: [id number] Date of Birth: 06/24/1955 Referring Provider: Kristeen Miss, MD  Encounter Date: 06/05/2016      PT End of Session - 06/05/16 1154    Visit Number 6   Number of Visits 14   Date for PT Re-Evaluation 07/06/16   Authorization Type [employer]   PT Start Time 1150   PT Stop Time 5093   PT Time Calculation (min) 45 min   Activity Tolerance Patient tolerated treatment well;No increased pain   Behavior During Therapy WFL for tasks assessed/performed      Past Medical History:  Diagnosis Date  . Anxiety   . Calculus, kidney 2000   Sees Dr. Risa Grill, urology.   . Cervical spondylosis 2004   sp surgery by Dr. Ellene Route  . Depression   . Headache(784.0)    Chronic, on vicodin 5 TID for this.   . Hepatitis C    Has occasional visits with Dr. Oletta Lamas GI, failed RX in 2000.  Liver Biopsy 9/06: Minimally active hepatitis consistent with hepatitis C, minimal necroinflamtory activity grade 1, no incrased fibrosis stage 0.   . Herpes labialis   . Hypertension   . Pneumonia   . Renal stone   . Spondylolisthesis of lumbar region   . Thalassemia    HGB 9/09 14.4 wtih MCV 72.6  . Wears glasses     Past Surgical History:  Procedure Laterality Date  . CERVICAL DISCECTOMY  2004   Dr Ellene Route  . COLONOSCOPY    . LITHOTRIPSY  2004   Dr Amalia Hailey  . TONSILLECTOMY      There were no vitals filed for this visit.      Subjective Assessment - 06/05/16 1153    Subjective No pain today. Had a good workout   Currently in Pain? No/denies            [facility] PT Assessment - 06/05/16 0001      Observation/Other Assessments   Focus on Therapeutic Outcomes (FOTO)  57% function     AROM   Lumbar Flexion 70   Lumbar Extension 20   Lumbar - Right Side Bend 20   Lumbar - Left  Side Bend 20   Lumbar - Right Rotation WFL   Lumbar - Left Rotation [facility]     Strength   Right Hip Flexion 5/5   Right Hip Extension 4+/5   Right Hip ABduction 4+/5   Right Hip ADduction 4+/5   Left Hip Extension 4+/5   Left Hip ABduction 4+/5   Left Hip ADduction 4+/5     Transfers   Five time sit to stand comments  14 sec no UE assist  11 sec as fast as able     Balance   Balance Assessed --  3-4 sec max each leg.                      [place] Adult PT Treatment/Exercise - 06/05/16 0001      Therapeutic Activites    Therapeutic Activities Lifting   Lifting from 8 inches off floor 8 pound box  with instruction in squat lift and hip hinge/deadlift a stechniqques and imact on knees and back     Lumbar Exercises: Aerobic   Stationary Bike Nustep L7  UE/LE 7 min     Lumbar Exercises: Supine  Bridge 5 reps   Bridge Limitations then single leg hip lifts      Lumbar Exercises: Sidelying   Clam 15 reps   Clam Limitations 3x5 feet off mat R/L     Lumbar Exercises: Quadruped   Opposite Arm/Leg Raise Right arm/Left leg;Left arm/Right leg;10 reps                PT Education - 06/05/16 1252    Education provided Yes   Education Details lifting mechanics   Person(s) Educated Patient   Methods Explanation;Demonstration;Tactile cues;Verbal cues   Comprehension Verbalized understanding;Returned demonstration          PT Short Term Goals - 05/31/16 1241      PT SHORT TERM GOAL #1   Title pt will be independent with HEP   Status Achieved     PT SHORT TERM GOAL #2   Baseline Able to walk without pain 2 miles   Status Achieved           PT Long Term Goals - 06/05/16 1236      PT LONG TERM GOAL #1   Title Pt will increase his FOTO score from  43% functional  to 57% functional.    Status Achieved     PT LONG TERM GOAL #2   Title Pt will be able to amb 20 minutes reporting no pain.    Status Achieved     PT LONG TERM GOAL #3   Title pt will  improve his bilateral LE hip strength to 5/5 throughout in order to improve functional mobility and gait.    Baseline 4+/5  both hips   Status Partially Met     PT LONG TERM GOAL #4   Title Pt will be able to demonstrate correct body mechanics when lifting 10 pounds from the floor and shelf.    Status Achieved               Plan - 06/05/16 1154    Clinical Impression Statement Greg Flowers has made improvements and acheived some goals. He is still under restrictions for lifting he reports as 10 pounds and will need for Dr Ellene Route to lift the restrictions to progress with lifting / return to gym.     PT Frequency 2x / week  after this week   PT Duration 4 weeks   PT Treatment/Interventions ADLs/Self Care Home Management;Patient/family education;Taping;Cryotherapy;Electrical Stimulation;Iontophoresis '4mg'$ /ml Dexamethasone;Moist Heat;Ultrasound;Neuromuscular re-education;Balance training;Therapeutic exercise;Therapeutic activities;Functional mobility training;Stair training;Gait training;Manual techniques;Passive range of motion   PT Next Visit Plan  lumbar stretching, hip strengthening, continue with quadraped exercises, machines as pt tolerates.  lifting from below knee height. .  Progress as limited by  MD   PT Home Exercise Plan bridges, hamstring stretches, hip abduction, single knee to chest, standing hip ext, abd, hamstring curls, calf raises   Consulted and Agree with Plan of Care Patient      Patient will benefit from skilled therapeutic intervention in order to improve the following deficits and impairments:  Pain, Decreased strength, Decreased activity tolerance, Difficulty walking, Decreased range of motion, Impaired perceived functional ability, Decreased balance  Visit Diagnosis: Acute bilateral low back pain without sciatica  Muscle weakness (generalized)  Difficulty in walking, not elsewhere classified     Problem List Patient Active Problem List   Diagnosis Date  Noted  . Spondylolisthesis at L4-L5 level 02/13/2016  . NSAID long-term use 01/09/2016  . Lumbar radiculopathy, acute 01/09/2016  . Preventative health care 02/12/2012  . Osteoarthritis of  both knees 11/02/2011  . NECK AND BACK PAIN 11/03/2009  . FOOT PAIN, BILATERAL 09/20/2009  . HEADACHE 05/06/2009  . DISORDER, DEPRESSIVE NEC 09/25/2006  . HERPES LABIALIS 04/04/2006  . THALASSEMIA NEC 04/04/2006  . Chronic hepatitis C without hepatic coma ([place]) 02/22/2006  . Essential hypertension 02/22/2006  . CALCULUS, KIDNEY 02/22/2006  . SPONDYLOSIS, CERVICAL 02/22/2006  . COLONOSCOPY, HX OF 02/22/2006    [name]  PT 06/05/2016, 12:56 PM  [facility] [address] [place], [place], [address] Phone: [phone]   Fax:  [phone]  Name: Greg Flowers MRN: [id number] Date of Birth: Apr 12, 1956

## 2016-06-06 DIAGNOSIS — M4316 Spondylolisthesis, lumbar region: Secondary | ICD-10-CM | POA: Diagnosis not present

## 2016-06-06 DIAGNOSIS — Z6830 Body mass index (BMI) 30.0-30.9, adult: Secondary | ICD-10-CM | POA: Diagnosis not present

## 2016-06-06 DIAGNOSIS — I10 Essential (primary) hypertension: Secondary | ICD-10-CM | POA: Diagnosis not present

## 2016-06-07 ENCOUNTER — Ambulatory Visit: Payer: Federal, State, Local not specified - PPO

## 2016-06-07 ENCOUNTER — Other Ambulatory Visit: Payer: Self-pay | Admitting: Neurological Surgery

## 2016-06-07 ENCOUNTER — Encounter: Payer: Self-pay | Admitting: Internal Medicine

## 2016-06-07 DIAGNOSIS — M6281 Muscle weakness (generalized): Secondary | ICD-10-CM

## 2016-06-07 DIAGNOSIS — M545 Low back pain, unspecified: Secondary | ICD-10-CM

## 2016-06-07 DIAGNOSIS — R262 Difficulty in walking, not elsewhere classified: Secondary | ICD-10-CM | POA: Diagnosis not present

## 2016-06-07 DIAGNOSIS — M4316 Spondylolisthesis, lumbar region: Secondary | ICD-10-CM

## 2016-06-07 NOTE — Therapy (Signed)
[place], [place], [address] Phone: [phone]   Fax:  [phone]  Physical Therapy Treatment  Patient Details  Name: Greg Flowers MRN: [id number] Date of Birth: [birth year] Referring Provider: Kristeen Miss, MD  Encounter Date: 06/07/2016      PT End of Session - 06/07/16 1321    Visit Number 7   Number of Visits 14   Date for PT Re-Evaluation 07/06/16   Authorization Type BC[employer]   PT Start Time 1230   PT Stop Time 1315   PT Time Calculation (min) 45 min   Activity Tolerance Patient tolerated treatment well;No increased pain   Behavior During Therapy WFL for tasks assessed/performed      Past Medical History:  Diagnosis Date  . Anxiety   . Calculus, kidney 2000   Sees Dr. Risa Flowers, urology.   . Cervical spondylosis 2004   sp surgery by Dr. Ellene Flowers  . Depression   . Headache(784.0)    Chronic, on vicodin 5 TID for this.   . Hepatitis C    Has occasional visits with Dr. Oletta Flowers GI, failed RX in 2000.  Liver Biopsy 9/06: Minimally active hepatitis consistent with hepatitis C, minimal necroinflamtory activity grade 1, no incrased fibrosis stage 0.   . Herpes labialis   . Hypertension   . Pneumonia   . Renal stone   . Spondylolisthesis of lumbar region   . Thalassemia    HGB 9/09 14.4 wtih MCV 72.6  . Wears glasses     Past Surgical History:  Procedure Laterality Date  . CERVICAL DISCECTOMY  2004   Dr Greg Flowers  . COLONOSCOPY    . LITHOTRIPSY  2004   Dr Greg Flowers  . TONSILLECTOMY      There were no vitals filed for this visit.      Subjective Assessment - 06/07/16 1243    Subjective No pain,  MD wants CT scan to assess bone healing.    Currently in Pain? No/denies                         [facility] Adult PT Treatment/Exercise - 06/07/16 0001      Lumbar Exercises: Aerobic   Stationary Bike Nustep L7  UE/LE 7 min     Lumbar Exercises: Standing   Wall Slides 15 reps;5  seconds   Other Standing Lumbar Exercises foot slide backward and side way for strength and balance x12 reps very good technique then prone   Other Standing Lumbar Exercises 9 inch step ups x 15 RT/LT     Lumbar Exercises: Supine   Bridge 5 reps   Bridge Limitations then single leg hip lifts 3x5     Lumbar Exercises: Sidelying   Clam 10 reps;3 seconds   Clam Limitations 2 sets feet off mat RT and LT     Lumbar Exercises: Quadruped   Opposite Arm/Leg Raise Right arm/Left leg;Left arm/Right leg;10 reps   Opposite Arm/Leg Raise Limitations also mutifidus prone with pubic press x 5 then press with knee flexion x 10 RT/LT  then press with SLR prone 2x5 reps                  PT Short Term Goals - 05/31/16 1241      PT SHORT TERM GOAL #1   Title pt will be independent with HEP   Status Achieved     PT SHORT TERM GOAL #2   Baseline Able to walk  without pain 2 miles   Status Achieved           PT Long Term Goals - 06/05/16 1236      PT LONG TERM GOAL #1   Title Pt will increase his FOTO score from  43% functional  to 57% functional.    Status Achieved     PT LONG TERM GOAL #2   Title Pt will be able to amb 20 minutes reporting no pain.    Status Achieved     PT LONG TERM GOAL #3   Title pt will improve his bilateral LE hip strength to 5/5 throughout in order to improve functional mobility and gait.    Baseline 4+/5  both hips   Status Partially Met     PT LONG TERM GOAL #4   Title Pt will be able to demonstrate correct body mechanics when lifting 10 pounds from the floor and shelf.    Status Achieved               Plan - 06/07/16 1242    Clinical Impression Statement Will hold until after Greg Flowers CT scan next week to resume PT . If cleared will progress toward incr lifting load , If not  will put on hold or discharge until cleared for progression  and add new goals   PT Treatment/Interventions ADLs/Self Care Home Management;Patient/family  education;Taping;Cryotherapy;Electrical Stimulation;Iontophoresis 78m/ml Dexamethasone;Moist Heat;Ultrasound;Neuromuscular re-education;Balance training;Therapeutic exercise;Therapeutic activities;Functional mobility training;Stair training;Gait training;Manual techniques;Passive range of motion   PT Next Visit Plan  lumbar stretching, hip strengthening, continue with quadraped exercises, machines as pt tolerates.  lifting from below knee height. .  Progress as limited by  MD.   PT Home Exercise Plan bridges, hamstring stretches, hip abduction, single knee to chest, standing hip ext, abd, hamstring curls, calf raises   Consulted and Agree with Plan of Care Patient      Patient will benefit from skilled therapeutic intervention in order to improve the following deficits and impairments:  Pain, Decreased strength, Decreased activity tolerance, Difficulty walking, Decreased range of motion, Impaired perceived functional ability, Decreased balance  Visit Diagnosis: Acute bilateral low back pain without sciatica  Muscle weakness (generalized)  Difficulty in walking, not elsewhere classified     Problem List Patient Active Problem List   Diagnosis Date Noted  . Spondylolisthesis at L4-L5 level 02/13/2016  . NSAID long-term use 01/09/2016  . Lumbar radiculopathy, acute 01/09/2016  . Preventative health care 02/12/2012  . Osteoarthritis of both knees 11/02/2011  . NECK AND BACK PAIN 11/03/2009  . FOOT PAIN, BILATERAL 09/20/2009  . HEADACHE 05/06/2009  . DISORDER, DEPRESSIVE NEC 09/25/2006  . HERPES LABIALIS 04/04/2006  . THALASSEMIA NEC 04/04/2006  . Chronic hepatitis C without hepatic coma ([facility] 02/22/2006  . Essential hypertension 02/22/2006  . CALCULUS, KIDNEY 02/22/2006  . SPONDYLOSIS, CERVICAL 02/22/2006  . COLONOSCOPY, HX OF 02/22/2006    CDarrel Flowers PT 06/07/2016, 1:22 PM  [facility]  [place] [address]Phone: [phone]  Fax:  [phone] Name: Greg ROMANOFFMRN: [id number] of Birth: 905/16/1957

## 2016-06-08 ENCOUNTER — Encounter: Payer: Federal, State, Local not specified - PPO | Admitting: Internal Medicine

## 2016-06-08 NOTE — Telephone Encounter (Signed)
Have called pt, wants diazepam filled, no visit since 10/5, schedule today, lm for rtc

## 2016-06-08 NOTE — Progress Notes (Deleted)
   CC: ***  HPI:  [name] is a 61 y.o. male with PMHx detailed below presenting with ***  See problem based assessment and plan below for additional details.  Past Medical History:  Diagnosis Date  . Anxiety   . Calculus, kidney 2000   Sees Dr. Isabel Flowers, urology.   . Cervical spondylosis 2004   sp surgery by Dr. Danielle Flowers  . Depression   . Headache(784.0)    Chronic, on vicodin 5 TID for this.   . Hepatitis C    Has occasional visits with Dr. Randa Flowers GI, failed RX in 2000.  Liver Biopsy 9/06: Minimally active hepatitis consistent with hepatitis C, minimal necroinflamtory activity grade 1, no incrased fibrosis stage 0.   . Herpes labialis   . Hypertension   . Pneumonia   . Renal stone   . Spondylolisthesis of lumbar region   . Thalassemia    HGB 9/09 14.4 wtih MCV 72.6  . Wears glasses     Review of Systems: ROS   Physical Exam: There were no vitals filed for this visit. There is no height or weight on file to calculate BMI. GENERAL- *** sitting comfortably in exam room chair, alert, in no distress HEENT- Atraumatic, PERRL, EOMI, moist mucous membranes, good dentition, no carotid bruit, no cervical lymphadenopathy CARDIAC- Regular rate and rhythm, no murmurs, rubs or gallops. RESP- Clear to ascultation bilaterally, no wheezing or crackles, normal work of breathing ABDOMEN- Normoactive bowel sounds, soft, nontender, nondistended BACK- Normal curvature, no paraspinal tenderness, no CVA tenderness. NEURO- Alert and oriented, cranial nerves grossly intact EXTREMITIES- Normal bulk and range of motion, no edema, 2+ peripheral pulses SKIN- Warm, dry, intact, without visible rash PSYCH- Appropriate affect, clear speech, thoughts linear and goal-directed ***  Assessment & Plan:   See encounters tab for problem based medical decision making.  Patient {GC/GE:[id number]::"discussed with","seen with"} Dr. {NAMES:[id number]::"Butcher","Granfortuna","E.  Hoffman","Klima","Mullen","Narendra","Vincent"}

## 2016-06-12 ENCOUNTER — Encounter: Payer: Federal, State, Local not specified - PPO | Admitting: Physical Therapy

## 2016-06-12 ENCOUNTER — Ambulatory Visit
Admission: RE | Admit: 2016-06-12 | Discharge: 2016-06-12 | Disposition: A | Discharge status: Home / Self Care | Payer: Federal, State, Local not specified - PPO | Source: Ambulatory Visit | Attending: Neurological Surgery | Admitting: Neurological Surgery

## 2016-06-12 DIAGNOSIS — M4316 Spondylolisthesis, lumbar region: Secondary | ICD-10-CM

## 2016-06-12 DIAGNOSIS — M545 Low back pain: Secondary | ICD-10-CM | POA: Diagnosis not present

## 2016-06-14 ENCOUNTER — Ambulatory Visit: Payer: Federal, State, Local not specified - PPO | Attending: Neurological Surgery

## 2016-06-14 DIAGNOSIS — M545 Low back pain, unspecified: Secondary | ICD-10-CM

## 2016-06-14 DIAGNOSIS — R262 Difficulty in walking, not elsewhere classified: Secondary | ICD-10-CM | POA: Diagnosis not present

## 2016-06-14 DIAGNOSIS — M6281 Muscle weakness (generalized): Secondary | ICD-10-CM | POA: Diagnosis not present

## 2016-06-14 NOTE — Therapy (Addendum)
[place] [address], [place], [address] Phone: [phone]   Fax:  [phone]  Physical Therapy Treatment/Discharge  Patient Details  Name: Greg Flowers MRN: [id number] Date of Birth: January 24, 1956 Referring Provider: Kristeen Miss, MD  Encounter Date: 06/14/2016      PT End of Session - 06/14/16 1316    Visit Number 8   Number of Visits 14   Date for PT Re-Evaluation 07/06/16   Authorization Type BCBS   PT Start Time 2229   PT Stop Time 1335   PT Time Calculation (min) 60 min   Activity Tolerance Patient tolerated treatment well;No increased pain   Behavior During Therapy WFL for tasks assessed/performed      Past Medical History:  Diagnosis Date  . Anxiety   . Calculus, kidney 2000   Sees Dr. Risa Grill, urology.   . Cervical spondylosis 2004   sp surgery by Dr. Ellene Route  . Depression   . Headache(784.0)    Chronic, on vicodin 5 TID for this.   . Hepatitis C    Has occasional visits with Dr. Oletta Lamas GI, failed RX in 2000.  Liver Biopsy 9/06: Minimally active hepatitis consistent with hepatitis C, minimal necroinflamtory activity grade 1, no incrased fibrosis stage 0.   . Herpes labialis   . Hypertension   . Pneumonia   . Renal stone   . Spondylolisthesis of lumbar region   . Thalassemia    HGB 9/09 14.4 wtih MCV 72.6  . Wears glasses     Past Surgical History:  Procedure Laterality Date  . CERVICAL DISCECTOMY  2004   Dr Ellene Route  . COLONOSCOPY    . LITHOTRIPSY  2004   Dr Amalia Hailey  . TONSILLECTOMY      There were no vitals filed for this visit.      Subjective Assessment - 06/14/16 1248    Subjective Did some  yard wor squatting and kneeling and has some soreness in abdomen / lower back and hips   Currently in Pain? Yes   Pain Score 3    Pain Location Back  hips   Pain Orientation Left;Anterior;Right;Posterior;Lower   Pain Descriptors / Indicators Aching;Sore   Pain Type --  post activity  soreness   Pain Onset In the past 7 days   Pain Frequency Constant   Aggravating Factors  yard work   Pain Relieving Factors med rest positons   Multiple Pain Sites No                         [facility] Adult PT Treatment/Exercise - 06/14/16 0001      Lumbar Exercises: Stretches   Single Knee to Chest Stretch 2 reps;30 seconds   Single Knee to Chest Stretch Limitations RT/LT   Lower Trunk Rotation 2 reps;20 seconds   Lower Trunk Rotation Limitations RT/LT      Lumbar Exercises: Aerobic   Stationary Bike L3 6 min     Lumbar Exercises: Standing   Wall Slides 15 reps;5 seconds   Other Standing Lumbar Exercises foot slide backward and side way for strength and balance x12 reps very good technique then prone   Other Standing Lumbar Exercises 9 inch step ups x 20 RT/LT     Lumbar Exercises: Quadruped   [place] 10 reps   Opposite Arm/Leg Raise Right arm/Left leg;Left arm/Right leg;10 reps   Opposite Arm/Leg Raise Limitations  mutifidus prone with pubic press x 5 10 sec hold then  press with knee flexion x 10 RT/LT  then press with SLR prone x10reps then bilateral arm lifts x 10     Moist Heat Therapy   Number Minutes Moist Heat 15 Minutes   Moist Heat Location Lumbar Spine                  PT Short Term Goals - 05/31/16 1241      PT SHORT TERM GOAL #1   Title pt will be independent with HEP   Status Achieved     PT SHORT TERM GOAL #2   Baseline Able to walk without pain 2 miles   Status Achieved           PT Long Term Goals - 06/05/16 1236      PT LONG TERM GOAL #1   Title Pt will increase his FOTO score from  43% functional  to 57% functional.    Status Achieved     PT LONG TERM GOAL #2   Title Pt will be able to amb 20 minutes reporting no pain.    Status Achieved     PT LONG TERM GOAL #3   Title pt will improve his bilateral LE hip strength to 5/5 throughout in order to improve functional mobility and gait.    Baseline 4+/5  both  hips   Status Partially Met     PT LONG TERM GOAL #4   Title Pt will be able to demonstrate correct body mechanics when lifting 10 pounds from the floor and shelf.    Status Achieved               Plan - 06/14/16 1316    Clinical Impression Statement He was sore form doing yard work but was able to do all exercises without incr pain.  He had his CT but has not heard from MD so will not add to lifting until cleared.   PT Treatment/Interventions ADLs/Self Care Home Management;Patient/family education;Taping;Cryotherapy;Electrical Stimulation;Iontophoresis 73m/ml Dexamethasone;Moist Heat;Ultrasound;Neuromuscular re-education;Balance training;Therapeutic exercise;Therapeutic activities;Functional mobility training;Stair training;Gait training;Manual techniques;Passive range of motion   PT Next Visit Plan  lumbar stretching, hip strengthening, continue with quadraped exercises, machines as pt tolerates.  lifting from below knee height. .  Progress as limited by  MD.   PT Home Exercise Plan bridges, hamstring stretches, hip abduction, single knee to chest, standing hip ext, abd, hamstring curls, calf raises   Consulted and Agree with Plan of Care Patient      Patient will benefit from skilled therapeutic intervention in order to improve the following deficits and impairments:  Pain, Decreased strength, Decreased activity tolerance, Difficulty walking, Decreased range of motion, Impaired perceived functional ability, Decreased balance  Visit Diagnosis: Acute bilateral low back pain without sciatica  Muscle weakness (generalized)  Difficulty in walking, not elsewhere classified     Problem List Patient Active Problem List   Diagnosis Date Noted  . Spondylolisthesis at L4-L5 level 02/13/2016  . NSAID long-term use 01/09/2016  . Lumbar radiculopathy, acute 01/09/2016  . Preventative health care 02/12/2012  . Osteoarthritis of both knees 11/02/2011  . NECK AND BACK PAIN 11/03/2009   . FOOT PAIN, BILATERAL 09/20/2009  . HEADACHE 05/06/2009  . DISORDER, DEPRESSIVE NEC 09/25/2006  . HERPES LABIALIS 04/04/2006  . THALASSEMIA NEC 04/04/2006  . Chronic [place] without hepatic [place] 02/22/2006  . Essential hypertension 02/22/2006  . CALCULUS, KIDNEY 02/22/2006  . SPONDYLOSIS, CERVICAL 02/22/2006  . COLONOSCOPY, HX OF 02/22/2006    CDarrel Hoover  PT 06/14/2016, 1:32 PM  [place], [place], [address] Phone: [phone]   Fax:  [phone]  Name: Greg Flowers MRN: [id number] Date of Birth: 08-02-1955  PHYSICAL THERAPY DISCHARGE SUMMARY  Visits from Start of Care: 8  Current functional level related to goals / functional outcomes: See above. He did not return after MD visit  Remaining deficits: See above   Education / Equipment: HEP Plan:                                                    Patient goals were met. Patient is being discharged due to not returning since the last visit.  ?????    Noralee Stain PT  08/16/16   8:12 AM

## 2016-07-20 DIAGNOSIS — F321 Major depressive disorder, single episode, moderate: Secondary | ICD-10-CM | POA: Diagnosis not present

## 2016-08-13 DIAGNOSIS — R351 Nocturia: Secondary | ICD-10-CM | POA: Diagnosis not present

## 2016-08-13 DIAGNOSIS — N401 Enlarged prostate with lower urinary tract symptoms: Secondary | ICD-10-CM | POA: Diagnosis not present

## 2016-08-13 DIAGNOSIS — R8271 Bacteriuria: Secondary | ICD-10-CM | POA: Diagnosis not present

## 2016-09-05 DIAGNOSIS — M4316 Spondylolisthesis, lumbar region: Secondary | ICD-10-CM | POA: Diagnosis not present

## 2016-09-05 DIAGNOSIS — Z6829 Body mass index (BMI) 29.0-29.9, adult: Secondary | ICD-10-CM | POA: Diagnosis not present

## 2016-09-05 DIAGNOSIS — I10 Essential (primary) hypertension: Secondary | ICD-10-CM | POA: Diagnosis not present

## 2016-09-07 DIAGNOSIS — R350 Frequency of micturition: Secondary | ICD-10-CM | POA: Diagnosis not present

## 2016-09-07 DIAGNOSIS — N401 Enlarged prostate with lower urinary tract symptoms: Secondary | ICD-10-CM | POA: Diagnosis not present

## 2016-09-07 DIAGNOSIS — R351 Nocturia: Secondary | ICD-10-CM | POA: Diagnosis not present

## 2016-09-07 DIAGNOSIS — N320 Bladder-neck obstruction: Secondary | ICD-10-CM | POA: Diagnosis not present

## 2016-09-08 ENCOUNTER — Emergency Department (HOSPITAL_COMMUNITY)
Admission: EM | Admit: 2016-09-08 | Discharge: 2016-09-09 | Disposition: A | Discharge status: Home / Self Care | Payer: Federal, State, Local not specified - PPO | Attending: Emergency Medicine | Admitting: Emergency Medicine

## 2016-09-08 ENCOUNTER — Encounter (HOSPITAL_COMMUNITY): Payer: Self-pay

## 2016-09-08 ENCOUNTER — Emergency Department (HOSPITAL_COMMUNITY): Payer: Federal, State, Local not specified - PPO

## 2016-09-08 DIAGNOSIS — R112 Nausea with vomiting, unspecified: Secondary | ICD-10-CM | POA: Insufficient documentation

## 2016-09-08 DIAGNOSIS — I10 Essential (primary) hypertension: Secondary | ICD-10-CM | POA: Insufficient documentation

## 2016-09-08 DIAGNOSIS — Z79899 Other long term (current) drug therapy: Secondary | ICD-10-CM | POA: Diagnosis not present

## 2016-09-08 DIAGNOSIS — E860 Dehydration: Secondary | ICD-10-CM | POA: Insufficient documentation

## 2016-09-08 DIAGNOSIS — R1031 Right lower quadrant pain: Secondary | ICD-10-CM | POA: Insufficient documentation

## 2016-09-08 DIAGNOSIS — R197 Diarrhea, unspecified: Secondary | ICD-10-CM | POA: Insufficient documentation

## 2016-09-08 DIAGNOSIS — Z87891 Personal history of nicotine dependence: Secondary | ICD-10-CM | POA: Diagnosis not present

## 2016-09-08 DIAGNOSIS — R109 Unspecified abdominal pain: Secondary | ICD-10-CM | POA: Diagnosis not present

## 2016-09-08 LAB — CBC
HCT: 39.9 % (ref 39.0–52.0)
Hemoglobin: 13.2 g/dL (ref 13.0–17.0)
MCH: 22.4 pg — ABNORMAL LOW (ref 26.0–34.0)
MCHC: 33.1 g/dL (ref 30.0–36.0)
MCV: 67.9 fL — ABNORMAL LOW (ref 78.0–100.0)
Platelets: 237 10*3/uL (ref 150–400)
RBC: 5.88 MIL/uL — ABNORMAL HIGH (ref 4.22–5.81)
RDW: 15.3 % (ref 11.5–15.5)
WBC: 10.5 10*3/uL (ref 4.0–10.5)

## 2016-09-08 LAB — URINALYSIS, ROUTINE W REFLEX MICROSCOPIC
Bilirubin Urine: NEGATIVE
Glucose, UA: NEGATIVE mg/dL
Hgb urine dipstick: NEGATIVE
Ketones, ur: NEGATIVE mg/dL
Leukocytes, UA: NEGATIVE
Nitrite: NEGATIVE
Protein, ur: NEGATIVE mg/dL
Specific Gravity, Urine: 1.014 (ref 1.005–1.030)
pH: 6 (ref 5.0–8.0)

## 2016-09-08 LAB — COMPREHENSIVE METABOLIC PANEL
ALT: 32 U/L (ref 17–63)
AST: 28 U/L (ref 15–41)
Albumin: 4 g/dL (ref 3.5–5.0)
Alkaline Phosphatase: 65 U/L (ref 38–126)
Anion gap: 10 (ref 5–15)
BUN: 22 mg/dL — ABNORMAL HIGH (ref 6–20)
CO2: 28 mmol/L (ref 22–32)
Calcium: 9.6 mg/dL (ref 8.9–10.3)
Chloride: 97 mmol/L — ABNORMAL LOW (ref 101–111)
Creatinine, Ser: 1.64 mg/dL — ABNORMAL HIGH (ref 0.61–1.24)
GFR calc Af Amer: 50 mL/min — ABNORMAL LOW (ref >60)
GFR calc non Af Amer: 43 mL/min — ABNORMAL LOW (ref >60)
Glucose, Bld: 101 mg/dL — ABNORMAL HIGH (ref 65–99)
Potassium: 3.5 mmol/L (ref 3.5–5.1)
Sodium: 135 mmol/L (ref 135–145)
Total Bilirubin: 0.5 mg/dL (ref 0.3–1.2)
Total Protein: 7.3 g/dL (ref 6.5–8.1)

## 2016-09-08 LAB — LIPASE, BLOOD: Lipase: 56 U/L — ABNORMAL HIGH (ref 11–51)

## 2016-09-08 MED ORDER — MORPHINE SULFATE (PF) 2 MG/ML IV SOLN
4.0000 mg | Freq: Once | INTRAVENOUS | Status: AC
  Administered 2016-09-08: 4 mg via INTRAVENOUS
  Filled 2016-09-08: qty 2

## 2016-09-08 MED ORDER — ONDANSETRON 8 MG PO TBDP: 8.0000 mg | ORAL_TABLET | Freq: Three times a day (TID) | ORAL | 0 refills | Status: DC | PRN

## 2016-09-08 MED ORDER — DICYCLOMINE HCL 20 MG PO TABS: 20.0000 mg | ORAL_TABLET | Freq: Two times a day (BID) | ORAL | 0 refills | Status: DC

## 2016-09-08 MED ORDER — IOPAMIDOL (ISOVUE-300) INJECTION 61%
100.0000 mL | Freq: Once | INTRAVENOUS | Status: AC | PRN
  Administered 2016-09-08: 100 mL via INTRAVENOUS

## 2016-09-08 MED ORDER — IOPAMIDOL (ISOVUE-300) INJECTION 61%
INTRAVENOUS | Status: AC
  Filled 2016-09-08: qty 100

## 2016-09-08 MED ORDER — SODIUM CHLORIDE 0.9 % IV BOLUS (SEPSIS)
1000.0000 mL | Freq: Once | INTRAVENOUS | Status: AC
  Administered 2016-09-08: 1000 mL via INTRAVENOUS

## 2016-09-08 MED ORDER — ONDANSETRON HCL 4 MG/2ML IJ SOLN
4.0000 mg | Freq: Once | INTRAMUSCULAR | Status: AC
  Administered 2016-09-08: 4 mg via INTRAVENOUS
  Filled 2016-09-08: qty 2

## 2016-09-08 NOTE — ED Provider Notes (Signed)
[facility]-EMERGENCY DEPT Provider Note   CSN: [id number] Arrival date & time: 09/08/16  0515     History   Chief Complaint Chief Complaint  Patient presents with  . Abdominal Pain  . Emesis  . Nausea    HPI Greg Flowers is a 61 y.o. male.  HPI Greg Flowers is a 61 y.o. male with history of kidney stones, hypertension, hepatitis C, presents to emergency department complaining of abdominal pain, nausea, vomiting. Pain began one week ago with nausea, vomiting, diarrhea. Patient states he ate some steak prior to onset of symptoms. He thought he may be constipated and took magnesium citrate which helped him. He states he felt okay for a day but symptoms returned. He is complaining of worsening pain, mainly to the right lower quadrant with associated nausea and vomiting. He took some milk of magnesia yesterday for persistent diarrhea which helped. He denies any fever or chills. No blood in his stool or emesis. He reports some abdominal distention. No prior abdominal surgeries. No similar symptoms in the past. No flank pain. Reports ongoing urinary symptoms for which she sees a urologist and just saw them yesterday.  Past Medical History:  Diagnosis Date  . Anxiety   . Calculus, kidney 2000   Sees Dr. Isabel Caprice, urology.   . Cervical spondylosis 2004   sp surgery by Dr. Danielle Dess  . Depression   . Headache(784.0)    Chronic, on vicodin 5 TID for this.   . Hepatitis C    Has occasional visits with Dr. Randa Evens GI, failed RX in 2000.  Liver Biopsy 9/06: Minimally active hepatitis consistent with hepatitis C, minimal necroinflamtory activity grade 1, no incrased fibrosis stage 0.   . Herpes labialis   . Hypertension   . Pneumonia   . Renal stone   . Spondylolisthesis of lumbar region   . Thalassemia    HGB 9/09 14.4 wtih MCV 72.6  . Wears glasses     Patient Active Problem List   Diagnosis Date Noted  . Spondylolisthesis at L4-L5 level 02/13/2016  . NSAID long-term use 01/09/2016  .  Lumbar radiculopathy, acute 01/09/2016  . Preventative health care 02/12/2012  . Osteoarthritis of both knees 11/02/2011  . NECK AND BACK PAIN 11/03/2009  . FOOT PAIN, BILATERAL 09/20/2009  . HEADACHE 05/06/2009  . DISORDER, DEPRESSIVE NEC 09/25/2006  . HERPES LABIALIS 04/04/2006  . THALASSEMIA NEC 04/04/2006  . Chronic hepatitis C without hepatic coma (HCC) 02/22/2006  . Essential hypertension 02/22/2006  . CALCULUS, KIDNEY 02/22/2006  . SPONDYLOSIS, CERVICAL 02/22/2006  . COLONOSCOPY, HX OF 02/22/2006    Past Surgical History:  Procedure Laterality Date  . CERVICAL DISCECTOMY  2004   Dr Danielle Dess  . COLONOSCOPY    . LITHOTRIPSY  2004   Dr Logan Bores  . TONSILLECTOMY         Home Medications    Prior to Admission medications   Medication Sig Start Date End Date Taking? Authorizing Provider  acetaminophen (TYLENOL) 500 MG tablet Take 1,000 mg by mouth 3 (three) times daily as needed for moderate pain or headache.    [provider]  amLODipine (NORVASC) 10 MG tablet Take 1 tablet (10 mg total) by mouth daily. 01/09/16   Althia Forts, MD  atenolol (TENORMIN) 50 MG tablet TAKE 1 TABLET (50 MG TOTAL) BY MOUTH DAILY. 01/09/16   Althia Forts, MD  benazepril (LOTENSIN) 40 MG tablet Take 2 tablets (80 mg total) by mouth daily. 01/09/16   Althia Forts, MD  diazepam (VALIUM) 5 MG tablet Take 1 tablet (5 mg total) by mouth daily as needed for anxiety. 01/09/16   Althia Forts, MD  diazepam (VALIUM) 5 MG tablet Take 1 tablet (5 mg total) by mouth daily as needed for muscle spasms. 02/14/16   Barnett Abu, MD  FLUoxetine (PROZAC) 40 MG capsule Take 40 mg by mouth Daily.  10/25/10   [provider]  gabapentin (NEURONTIN) 300 MG capsule TAKE 2 CAPSULES BY MOUTH 3 TIMES A DAY Patient not taking: Reported on 05/01/2016 03/13/16   Althia Forts, MD  Garlic 2000 MG CAPS Take 4,000 mg by mouth daily.    [provider]  hydrochlorothiazide (HYDRODIURIL) 25 MG tablet TAKE 1  TABLET (25 MG TOTAL) BY MOUTH DAILY. 01/09/16   Althia Forts, MD  meloxicam (MOBIC) 15 MG tablet Take 15 mg by mouth daily.    [provider]  Multiple Vitamins-Minerals ([employer] SPECTRAVITE ULTRA MEN 50+ PO) Take 1 tablet by mouth daily.    [provider]  naproxen (NAPROSYN) 500 MG tablet TAKE 1 TABLET BY MOUTH TWICE A DAY WITH A MEAL 04/18/16   Althia Forts, MD  OLANZapine (ZYPREXA) 5 MG tablet Take 5 mg by mouth Daily.  10/25/10   [provider]  omeprazole (PRILOSEC) 20 MG capsule Take 1 capsule (20 mg total) by mouth daily.    Courtney Paris, MD  oxyCODONE-acetaminophen (PERCOCET/ROXICET) 5-325 MG tablet Take 1-2 tablets by mouth every 4 (four) hours as needed for moderate pain. Patient not taking: Reported on 05/01/2016 02/14/16   Barnett Abu, MD  sodium chloride (OCEAN) 0.65 % SOLN nasal spray Place 2 sprays into both nostrils as needed for congestion.    [provider]  Tamsulosin HCl (FLOMAX) 0.4 MG CAPS Take 0.4 mg by mouth at bedtime.      [provider]  Tetrahydrozoline HCl (VISINE OP) Place 2 drops into both eyes as needed (for dry eyes).    [provider]  traMADol (ULTRAM) 50 MG tablet Take 50-100 mg by mouth every 6 (six) hours as needed for moderate pain.    [provider]    Family History Family History  Problem Relation Age of Onset  . Hypertension Mother   . Hypertension Father     Social History Social History  Substance Use Topics  . Smoking status: Former Games developer  . Smokeless tobacco: Never Used     Comment: quit in the early 1980's  . Alcohol use Yes     Comment: Occasionally     Allergies   Patient has no known allergies.   Review of Systems Review of Systems  Constitutional: Negative for chills and fever.  Respiratory: Negative for cough, chest tightness and shortness of breath.   Cardiovascular: Negative for chest pain, palpitations and leg swelling.  Gastrointestinal: Positive for  abdominal pain, diarrhea, nausea and vomiting. Negative for abdominal distention.  Genitourinary: Negative for dysuria, frequency, hematuria and urgency.  Musculoskeletal: Negative for arthralgias, myalgias, neck pain and neck stiffness.  Skin: Negative for rash.  Allergic/Immunologic: Negative for immunocompromised state.  Neurological: Negative for dizziness, weakness, light-headedness, numbness and headaches.  All other systems reviewed and are negative.    Physical Exam Updated Vital Signs BP (!) 145/88 (BP Location: Right Arm)   Pulse 70   Temp 97.6 F (36.4 C) (Oral)   Resp 18   Physical Exam  Constitutional: He appears well-developed and well-nourished. No distress.  HENT:  Head: Normocephalic and atraumatic.  Eyes: Conjunctivae are  normal.  Neck: Neck supple.  Cardiovascular: Normal rate, regular rhythm and normal heart sounds.   Pulmonary/Chest: Effort normal. No respiratory distress. He has no wheezes. He has no rales.  Abdominal: Soft. Bowel sounds are normal. He exhibits no distension. There is tenderness. There is no rebound.  RUQ and RLQ tenderness with guarding   Musculoskeletal: He exhibits no edema.  Neurological: He is alert.  Skin: Skin is warm and dry.  Nursing note and vitals reviewed.    ED Treatments / Results  Labs (all labs ordered are listed, but only abnormal results are displayed) Labs Reviewed  LIPASE, BLOOD - Abnormal; Notable for the following:       Result Value   Lipase 56 (*)    All other components within normal limits  COMPREHENSIVE METABOLIC PANEL - Abnormal; Notable for the following:    Chloride 97 (*)    Glucose, Bld 101 (*)    BUN 22 (*)    Creatinine, Ser 1.64 (*)    GFR calc non Af Amer 43 (*)    GFR calc Af Amer 50 (*)    All other components within normal limits  CBC - Abnormal; Notable for the following:    RBC 5.88 (*)    MCV 67.9 (*)    MCH 22.4 (*)    All other components within normal limits  URINALYSIS, ROUTINE  W REFLEX MICROSCOPIC    EKG  EKG Interpretation None       Radiology Ct Abdomen Pelvis W Contrast  Result Date: 09/08/2016 CLINICAL DATA:  Acute right lower quadrant abdominal pain. EXAM: CT ABDOMEN AND PELVIS WITH CONTRAST TECHNIQUE: Multidetector CT imaging of the abdomen and pelvis was performed using the standard protocol following bolus administration of intravenous contrast. CONTRAST:  100mL ISOVUE-300 IOPAMIDOL (ISOVUE-300) INJECTION 61% COMPARISON:  None. FINDINGS: Lower chest: No acute abnormality. Hepatobiliary: No focal liver abnormality is seen. Status post cholecystectomy. No biliary dilatation. Pancreas: Unremarkable. No pancreatic ductal dilatation or surrounding inflammatory changes. Spleen: Normal in size without focal abnormality. Adrenals/Urinary Tract: Adrenal glands appear normal. Bilateral nephrolithiasis is noted. Left renal cysts are noted. No hydronephrosis or renal obstruction is noted. No ureteral calculi are noted. Urinary bladder appears normal. Stomach/Bowel: Stomach is within normal limits. Appendix appears normal. No evidence of bowel wall thickening, distention, or inflammatory changes. Vascular/Lymphatic: No significant vascular findings are present. No enlarged abdominal or pelvic lymph nodes. Reproductive: Prostate is unremarkable. Other: Small fat containing periumbilical hernia is noted. No abnormal fluid collection is noted. Musculoskeletal: No acute or significant osseous findings. IMPRESSION: Bilateral nonobstructive nephrolithiasis. No other significant abnormality seen in the abdomen or pelvis. Electronically Signed   By: Lupita RaiderJames  Green Jr, M.D.   On: 09/08/2016 08:14    Procedures Procedures (including critical care time)  Medications Ordered in ED Medications  iopamidol (ISOVUE-300) 61 % injection (not administered)  morphine 2 MG/ML injection 4 mg (4 mg Intravenous Given 09/08/16 0628)  sodium chloride 0.9 % bolus 1,000 mL (0 mLs Intravenous Stopped  09/08/16 0853)  ondansetron (ZOFRAN) injection 4 mg (4 mg Intravenous Given 09/08/16 0629)  iopamidol (ISOVUE-300) 61 % injection 100 mL (100 mLs Intravenous Contrast Given 09/08/16 0750)     Initial Impression / Assessment and Plan / ED Course  I have reviewed the triage vital signs and the nursing notes.  Pertinent labs & imaging results that were available during my care of the patient were reviewed by me and considered in my medical decision making (see chart for details).  Patient to emergency department with worsening abdominal pain, nausea, vomiting. He appears to be uncomfortable, he has significant right lower quadrant tenderness with guarding. Will get labs and CT abdomen and pelvis for further evaluation. Morphine, Zofran, fluids ordered.  9:35 AM Patient states he is feeling better after IV fluids and Zofran and morphine. Patient's lab work showed slightly elevated creatinine of 1.6 and be out of 22. Concerning for patient may be dehydrated. IV fluids given in emergency department. Urinalysis is normal. CT scan showing a nonobstructive kidney stones, otherwise normal. Infectious process considered, however patient states he is no longer having any diarrhea. No elevation in white blood cell count. I do not suspect mesenteric ischemia since negative CT scan and pain for a week, improvement of pain with medications. Will have patient follow-up with family doctor. We'll try Bentyl and Zofran for his symptoms. I did discuss return precautions, patient is comfortable with the plan and voices understanding. He is able to tolerate oral fluids in emergency department. VS normal.   Vitals:   09/08/16 0700 09/08/16 0820 09/08/16 0821 09/08/16 0852  BP: (!) 149/94 (!) 138/95 (!) 138/95   Pulse: 65 75 76   Resp:   19   Temp:    97.8 F (36.6 C)  TempSrc:      SpO2: 98% 93% 97%      Final Clinical Impressions(s) / ED Diagnoses   Final diagnoses:  Right lower quadrant abdominal pain    Dehydration    New Prescriptions New Prescriptions   DICYCLOMINE (BENTYL) 20 MG TABLET    Take 1 tablet (20 mg total) by mouth 2 (two) times daily.   ONDANSETRON (ZOFRAN ODT) 8 MG DISINTEGRATING TABLET    Take 1 tablet (8 mg total) by mouth every 8 (eight) hours as needed for nausea or vomiting.     Jaynie Crumble, PA-C 09/08/16 1610    Shaune Pollack, MD 09/09/16 [phone]

## 2016-09-08 NOTE — Discharge Instructions (Signed)
Take bentyl to help with abdominal pain and cramping. Take zofran for nausea as prescribed as needed. You can take tylenol for any additional pain relief. Drink plenty of fluids. If diarrhea returns, you may need to take a sample to your doctor for further testing. If not improving in 2-3 days follow up or return to ED.

## 2016-09-08 NOTE — ED Triage Notes (Signed)
Pt reports 10/10 RLQ Abd pain along with n/v that has been progressive since Monday. Pt reports taking Mag Citrate w/o relief.

## 2016-09-09 NOTE — ED Notes (Signed)
Patient was not completely taken out of system. Discharge was at 09:40

## 2016-09-09 NOTE — ED Notes (Signed)
Patient denies pain and is resting comfortably.  

## 2016-09-12 ENCOUNTER — Ambulatory Visit (INDEPENDENT_AMBULATORY_CARE_PROVIDER_SITE_OTHER): Payer: Federal, State, Local not specified - PPO | Admitting: Internal Medicine

## 2016-09-12 VITALS — BP 124/81 | HR 70 | Temp 97.7°F | Ht 70.0 in | Wt 198.2 lb

## 2016-09-12 DIAGNOSIS — R1031 Right lower quadrant pain: Secondary | ICD-10-CM

## 2016-09-12 DIAGNOSIS — G588 Other specified mononeuropathies: Secondary | ICD-10-CM

## 2016-09-12 DIAGNOSIS — N179 Acute kidney failure, unspecified: Secondary | ICD-10-CM | POA: Diagnosis not present

## 2016-09-12 DIAGNOSIS — I10 Essential (primary) hypertension: Secondary | ICD-10-CM | POA: Diagnosis not present

## 2016-09-12 NOTE — Progress Notes (Signed)
   CC: abdominal pain  HPI:  [name] is a 61 y.o. who presents today for abdominal pain. Please see assessment & plan for status of chronic medical problems.   Past Medical History:  Diagnosis Date  . Anxiety   . Calculus, kidney 2000   Sees Dr. Isabel Flowers, urology.   . Cervical spondylosis 2004   sp surgery by Dr. Danielle Flowers  . Depression   . Headache(784.0)    Chronic, on vicodin 5 TID for this.   . Hepatitis C    Has occasional visits with Dr. Randa Flowers GI, failed RX in 2000.  Liver Biopsy 9/06: Minimally active hepatitis consistent with hepatitis C, minimal necroinflamtory activity grade 1, no incrased fibrosis stage 0.   . Herpes labialis   . Hypertension   . Pneumonia   . Renal stone   . Spondylolisthesis of lumbar region   . Thalassemia    HGB 9/09 14.4 wtih MCV 72.6  . Wears glasses     Review of Systems:  Please see each problem below for a pertinent review of systems.  Physical Exam:  Vitals:   09/12/16 1023  BP: 124/81  Pulse: 70  Temp: 97.7 F (36.5 C)  TempSrc: Oral  SpO2: 100%  Weight: 198 lb 3.2 oz (89.9 kg)  Height: 5\' 10"  (1.778 m)   Physical Exam  Constitutional: He is oriented to person, place, and time. No distress.  HENT:  Head: Normocephalic and atraumatic.  Eyes: Conjunctivae are normal. No scleral icterus.  Pulmonary/Chest: No respiratory distress.  Abdominal:  Hypoactive bowel sounds. Focal point tenderness in the RLQ just above his groin. Less point tenderness overlying RUQ.  Neurological: He is alert and oriented to person, place, and time.  Skin: He is not diaphoretic.    Assessment & Plan:   See Encounters Tab for problem based charting.  Patient seen with Dr. Cleda DaubE. Hoffman

## 2016-09-12 NOTE — Patient Instructions (Addendum)
HOLD your benazepril and HCTZ until we get your bloodwork back.  For sleep, ask the pharmacist about melatonin. Avoid Benadryl.   If no better, please come back next week.

## 2016-09-12 NOTE — Assessment & Plan Note (Addendum)
Assessment He went to his urologist 5/25 after experiencing night sweats, nausea, vomiting x 1 week which he felt may be related to nocturia. He was started on mirabegron but went to the ED 5/26 as he felt acutely worse after taking this medication. In the ED, his labwork was notable for acute kidney injury [Crt 1.6, up from 1.2-1.3] but no LFT or CBC abnormalities. Abdominal CT scan noted non-obstructive renal calculi but no acute process; of note, the report notes cholecystectomy though he never recalls having had his galbladder removed. In the interval following his ED visit, he feels his pain is no 7/10, down from 10/10, and thinks it may have improved with ondansetron and dicyclomine which was prescribed after his ED visit. He has also taken ibuprofen x 6 tablets with some relief.  His workup is reassuring for no acute process though he does have some mild RUQ tenderness which may suggest biliary process. Mirabegron can cause angioedema, but he denies a prior history of this reaction and is currently on an ACE inhibitor; imaging also did not show edema and bowel wall thickening. The point of maximal pain raises suspicion for nerve entrapment, but I'm unsure how the night sweats, nausea, vomiting would be related. We can perform a trigger point injection to assess for improvement in symptoms.  Plan -Check CMET to reassess kidney function and LFT abnormalities. If the latter is evident, we can consider focused RUQ US. -Avoiding muscle relaxants given anticholinergic effects which may worsen his urinary symptoms.  -Recommended he try melatonin for sleep and deferred zolpidem to his psychiatrist. 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  All questions were answered.    The focal point of tenderness of the right lower quadrant was cleaned with alcohol swabs. Using a 25 gauge 1 inch needle the skin was injected with 3 mL of 1% Lidocaine without Epi. The needle was withdrawn, the site was cleaned and covered with a band aid.   The patient did tolerate the procedure well and there were no complications.    ADDENDUM 09/13/2016  1:30 PM:  Creatinine 1.5-1.6, still not at baseline 1.2-1.3. I called him, and he confirms holding his diuretic and ARB until follow-up next week. On a separate note, he appreciated the melatonin and feels his pain has improved from 7/10 to 4/10.

## 2016-09-12 NOTE — Assessment & Plan Note (Signed)
Assessment His BP today is 124/81 and he acknowledges adherence to his four drug regimen. Given his recent labwork, I am concerned he could have worsening renal function.  Plan -CMET as noted elsewhere -Hold benazepril and HCTZ until we can assess that his kidney function has normalized

## 2016-09-13 ENCOUNTER — Telehealth: Payer: Self-pay

## 2016-09-13 DIAGNOSIS — N179 Acute kidney failure, unspecified: Secondary | ICD-10-CM | POA: Insufficient documentation

## 2016-09-13 LAB — CMP14 + ANION GAP
ALT: 33 IU/L (ref 0–44)
AST: 29 IU/L (ref 0–40)
Albumin/Globulin Ratio: 1.4 (ref 1.2–2.2)
Albumin: 4.2 g/dL (ref 3.6–4.8)
Alkaline Phosphatase: 67 IU/L (ref 39–117)
Anion Gap: 17 mmol/L (ref 10.0–18.0)
BUN/Creatinine Ratio: 10 (ref 10–24)
BUN: 15 mg/dL (ref 8–27)
Bilirubin Total: 0.4 mg/dL (ref 0.0–1.2)
CO2: 27 mmol/L (ref 18–29)
Calcium: 9.8 mg/dL (ref 8.6–10.2)
Chloride: 94 mmol/L — ABNORMAL LOW (ref 96–106)
Creatinine, Ser: 1.53 mg/dL — ABNORMAL HIGH (ref 0.76–1.27)
GFR calc Af Amer: 56 mL/min/{1.73_m2} — ABNORMAL LOW (ref >59)
GFR calc non Af Amer: 48 mL/min/{1.73_m2} — ABNORMAL LOW (ref >59)
Globulin, Total: 3.1 g/dL (ref 1.5–4.5)
Glucose: 92 mg/dL (ref 65–99)
Potassium: 4.1 mmol/L (ref 3.5–5.2)
Sodium: 138 mmol/L (ref 134–144)
Total Protein: 7.3 g/dL (ref 6.0–8.5)

## 2016-09-13 NOTE — Telephone Encounter (Signed)
Asking to speak with Dr Delia Chimes. Patel. Please call pt back.

## 2016-09-13 NOTE — Telephone Encounter (Signed)
I spoke to him, and he wanted a refill on amlodipine and atenolol as we are holding HCTZ and benazepril until he has repeat labwork next week.  I have also pended a STAT BMET. If we could have him come 30 minutes before his appointment next week, they will be able to decide what to do.   Thank you.

## 2016-09-13 NOTE — Assessment & Plan Note (Signed)
ADDENDUM 09/13/2016  1:30 PM:  Creatinine 1.5-1.6, still not at baseline 1.2-1.3. I called him, and he confirms holding his diuretic and ARB until follow-up next week.

## 2016-09-13 NOTE — Telephone Encounter (Signed)
Called pt and scheduled lab for before next appt

## 2016-09-17 NOTE — Progress Notes (Signed)
[facility] Medicine Clinic Attending  Case discussed with Dr. Patel,Rushil at the time of the visit.  We reviewed the resident's history and exam and pertinent patient test results.  I agree with the assessment, diagnosis, and plan of care documented in the resident's note.  

## 2016-09-20 ENCOUNTER — Other Ambulatory Visit: Payer: Federal, State, Local not specified - PPO

## 2016-09-20 ENCOUNTER — Ambulatory Visit (INDEPENDENT_AMBULATORY_CARE_PROVIDER_SITE_OTHER): Payer: Federal, State, Local not specified - PPO | Admitting: Internal Medicine

## 2016-09-20 ENCOUNTER — Ambulatory Visit (HOSPITAL_COMMUNITY)
Admission: RE | Admit: 2016-09-20 | Discharge: 2016-09-20 | Disposition: A | Discharge status: Home / Self Care | Payer: Federal, State, Local not specified - PPO | Source: Ambulatory Visit | Attending: Internal Medicine | Admitting: Internal Medicine

## 2016-09-20 ENCOUNTER — Encounter: Payer: Self-pay | Admitting: Internal Medicine

## 2016-09-20 VITALS — BP 143/91 | HR 69 | Temp 97.9°F | Ht 70.0 in | Wt 200.5 lb

## 2016-09-20 DIAGNOSIS — N189 Chronic kidney disease, unspecified: Secondary | ICD-10-CM

## 2016-09-20 DIAGNOSIS — R1031 Right lower quadrant pain: Secondary | ICD-10-CM | POA: Diagnosis not present

## 2016-09-20 DIAGNOSIS — B182 Chronic viral hepatitis C: Secondary | ICD-10-CM

## 2016-09-20 DIAGNOSIS — N200 Calculus of kidney: Secondary | ICD-10-CM | POA: Insufficient documentation

## 2016-09-20 DIAGNOSIS — N179 Acute kidney failure, unspecified: Secondary | ICD-10-CM | POA: Diagnosis not present

## 2016-09-20 DIAGNOSIS — I129 Hypertensive chronic kidney disease with stage 1 through stage 4 chronic kidney disease, or unspecified chronic kidney disease: Secondary | ICD-10-CM | POA: Diagnosis not present

## 2016-09-20 DIAGNOSIS — Z87891 Personal history of nicotine dependence: Secondary | ICD-10-CM | POA: Diagnosis not present

## 2016-09-20 DIAGNOSIS — Z5189 Encounter for other specified aftercare: Secondary | ICD-10-CM

## 2016-09-20 DIAGNOSIS — N281 Cyst of kidney, acquired: Secondary | ICD-10-CM | POA: Diagnosis not present

## 2016-09-20 LAB — BASIC METABOLIC PANEL
Anion gap: 8 (ref 5–15)
BUN: 14 mg/dL (ref 6–20)
CO2: 29 mmol/L (ref 22–32)
Calcium: 8.8 mg/dL — ABNORMAL LOW (ref 8.9–10.3)
Chloride: 101 mmol/L (ref 101–111)
Creatinine, Ser: 1.17 mg/dL (ref 0.61–1.24)
GFR calc Af Amer: >60 mL/min (ref >60)
GFR calc non Af Amer: >60 mL/min (ref >60)
Glucose, Bld: 143 mg/dL — ABNORMAL HIGH (ref 65–99)
Potassium: 3.4 mmol/L — ABNORMAL LOW (ref 3.5–5.1)
Sodium: 138 mmol/L (ref 135–145)

## 2016-09-20 MED ORDER — ONDANSETRON 8 MG PO TBDP: 8.0000 mg | ORAL_TABLET | Freq: Three times a day (TID) | ORAL | 0 refills | Status: DC | PRN

## 2016-09-20 MED ORDER — OMEPRAZOLE 20 MG PO CPDR: 20.0000 mg | DELAYED_RELEASE_CAPSULE | Freq: Every day | ORAL | 0 refills | Status: DC

## 2016-09-20 NOTE — Patient Instructions (Addendum)
I am not sure why you are continuing to have abdominal pain. I would like to get an ultrasound of your belly in order to see if there is any inflammation of her gallbladder and appendix.  Your kidney function is back to normal. He can go back to taking your HCTZ and benazepril.  I have prescribed you omeprazole again in case acid reflux may be contributing to this, as well as some more Zofran to take as needed for nausea.  Let's have you take an appointment to come back in about 2 weeks and see how you're doing. If the pain, nausea, and vomiting gets worse, or you have fevers please call for sooner appointment or go to emergency department if this emergency.

## 2016-09-20 NOTE — Assessment & Plan Note (Signed)
BMP Latest Ref Rng & Units 09/20/2016 09/12/2016 09/08/2016  Glucose 65 - 99 mg/dL 161(W143(H) 92 960(A101(H)  BUN 6 - 20 mg/dL 14 15 54(U22(H)  Creatinine 0.61 - 1.24 mg/dL 9.811.17 1.91(Y1.53(H) 7.82(N1.64(H)  BUN/Creat Ratio 10 - 24 - 10 -  Sodium 135 - 145 mmol/L 138 138 135  Potassium 3.5 - 5.1 mmol/L 3.4(L) 4.1 3.5  Chloride 101 - 111 mmol/L 101 94(L) 97(L)  CO2 22 - 32 mmol/L 29 27 28   Calcium 8.9 - 10.3 mg/dL 5.6(O8.8(L) 9.8 9.6   AKI has resolved, renal function back to baseline.  -Resume HCTZ and benazepril for hypertension

## 2016-09-20 NOTE — Progress Notes (Signed)
CC: "My belly keeps hurting."  HPI:  [name] is a 61 y.o. man with history of hypertension, nephrolithiasis, chronic HCV, and CKD who presents for follow-up of acute kidney injury and abdominal pain.  For the past 3 weeks he has had abdominal pain, nausea, and vomiting.  It initially started after eating some steak which he thinks may have been bad. He then took a laxative which temporarily helped but then his symptoms returned he has had nausea which is worse in the morning, and has vomited about twice in the last week non-bloody emesis. The pain and nausea are not made worse by eating. The pain is worse when he lies down on his right side. He also notes night sweats and anorexia but no fevers or chills. He has had some dark green bowel movements but no melena or bloody stools, with a bowel movement about every other day which is consistent with his normal bowel habits.  He presented to the emergency department 2 weeks ago was found to have bilateral nonobstructive nephrolithiasis and was treated conservatively.  He was seen in clinic last week, and creatinine was elevated above baseline of 1.2. He was instructed to hold his diuretic and ARB until follow-up. He also had a trigger point injection with concern for anterior cutaneous nerve entrapment which he thinks may have helped for about a day.  Has not seen Dr Greg Flowers with ID for chronic HCV yet.  Appt 6/20.  Past Medical History:  Diagnosis Date  . Anxiety   . Calculus, kidney 2000   Sees Dr. Isabel CapriceGrapey, urology.   . Cervical spondylosis 2004   sp surgery by Dr. Danielle DessElsner  . Depression   . Headache(784.0)    Chronic, on vicodin 5 TID for this.   . Hepatitis C    Has occasional visits with Dr. Randa [facility]ensEd[facility], failed RX in 2000.  Liver Biopsy 9/06: Minimally active hepatitis consistent with hepatitis C, minimal necroinflamtory activity grade 1, no incrased fibrosis stage 0.   . Herpes labialis   . Hypertension   . Pneumonia   . Renal  stone   . Spondylolisthesis of lumbar region   . Thalassemia    HGB 9/09 14.4 wtih MCV 72.6  . Wears glasses    Review of Systems:   Review of Systems  Constitutional: Positive for diaphoresis. Negative for chills and fever.  Gastrointestinal: Positive for abdominal pain, nausea and vomiting. Negative for blood in stool, constipation, diarrhea, heartburn and melena.  Genitourinary: Negative for dysuria, flank pain and hematuria.       Urinary hesitancy   Physical Exam:  Vitals:   09/20/16 0926  BP: (!) 143/91  Pulse: 69  Temp: 97.9 F (36.6 C)  TempSrc: Oral  SpO2: 99%  Weight: 200 lb 8 oz (90.9 kg)  Height: 5\' 10"  (1.778 m)   Post void residual 22 mL by bladder scan  Physical Exam  Constitutional: He is oriented to person, place, and time. He appears well-developed and well-nourished. No distress.  Cardiovascular: Normal rate and regular rhythm.   Pulmonary/Chest: Effort normal and breath sounds normal.  Abdominal:  Bowel sounds present Soft, no guarding No hepatosplenomegaly Tenderness to deep palpation right lower quadrant greater than right upper quadrant  Genitourinary:  Genitourinary Comments: No suprapubic or CVA tenderness  Neurological: He is alert and oriented to person, place, and time.  Psychiatric: He has a normal mood and affect. His behavior is normal.    Assessment & Plan:   See Encounters  Tab for problem based charting.  Patient discussed with Dr. Criselda Peaches

## 2016-09-20 NOTE — Assessment & Plan Note (Addendum)
His subacute right lower quadrant abdominal pain and nausea continue.    A/P He is nontoxic with subacute symptoms making cholelithiasis, appendicitis, diverticulitis, or other dangerous intra-abdominal process unlikely.  Postvoid residual in clinic today shows he is not retaining urine.  His tenderness is to deep palpation and trigger point injection did not provide lasting relief, I doubt cutaneous nerve entrapment explains his symptoms.  The lack of association with food makes biliary colic less likely. He has had normal LFTs and only mildly elevated lipase previously, and I doubt acute hepatitis or pancreatitis. CT scan showed nonobstructive nephrolithiasis, but his last UA was without hematuria, so nephrolithiasis seems like an unlikely etiology for his symptoms. The etiology of his symptoms remains unclear. -repeat UA -Abdominal ultrasound -Zofran when necessary -Follow-up in 2 weeks or when necessary

## 2016-09-21 LAB — URINALYSIS, COMPLETE
Bilirubin, UA: NEGATIVE
Glucose, UA: NEGATIVE
Ketones, UA: NEGATIVE
Nitrite, UA: NEGATIVE
Protein, UA: NEGATIVE
RBC, UA: NEGATIVE
Specific Gravity, UA: 1.019 (ref 1.005–1.030)
Urobilinogen, Ur: 1 mg/dL (ref 0.2–1.0)
pH, UA: 7.5 (ref 5.0–7.5)

## 2016-09-21 LAB — MICROSCOPIC EXAMINATION
Bacteria, UA: NONE SEEN
Casts: NONE SEEN /lpf
Epithelial Cells (non renal): NONE SEEN /hpf (ref 0–10)

## 2016-09-21 NOTE — Progress Notes (Signed)
Internal Medicine Clinic Attending  Case discussed with Dr. O'Sullivan at the time of the visit.  We reviewed the resident's history and exam and pertinent patient test results.  I agree with the assessment, diagnosis, and plan of care documented in the resident's note. 

## 2016-09-24 ENCOUNTER — Encounter: Payer: Self-pay | Admitting: *Deleted

## 2016-09-26 ENCOUNTER — Encounter: Payer: Self-pay | Admitting: Internal Medicine

## 2016-09-28 ENCOUNTER — Encounter: Payer: Self-pay | Admitting: Internal Medicine

## 2016-09-29 ENCOUNTER — Other Ambulatory Visit: Payer: Self-pay | Admitting: Internal Medicine

## 2016-10-03 ENCOUNTER — Encounter: Payer: Self-pay | Admitting: Internal Medicine

## 2016-10-03 ENCOUNTER — Telehealth: Payer: Self-pay | Admitting: *Deleted

## 2016-10-03 ENCOUNTER — Ambulatory Visit (INDEPENDENT_AMBULATORY_CARE_PROVIDER_SITE_OTHER): Payer: Federal, State, Local not specified - PPO | Admitting: Internal Medicine

## 2016-10-03 VITALS — BP 145/101 | HR 85 | Temp 97.6°F | Ht 69.0 in | Wt 204.0 lb

## 2016-10-03 DIAGNOSIS — B182 Chronic viral hepatitis C: Secondary | ICD-10-CM

## 2016-10-03 DIAGNOSIS — Z23 Encounter for immunization: Secondary | ICD-10-CM

## 2016-10-03 LAB — COMPLETE METABOLIC PANEL WITH GFR
ALT: 38 U/L (ref 9–46)
AST: 31 U/L (ref 10–35)
Albumin: 4.2 g/dL (ref 3.6–5.1)
Alkaline Phosphatase: 75 U/L (ref 40–115)
BUN: 18 mg/dL (ref 7–25)
CO2: 30 mmol/L (ref 20–31)
Calcium: 9.4 mg/dL (ref 8.6–10.3)
Chloride: 98 mmol/L (ref 98–110)
Creat: 1.18 mg/dL (ref 0.70–1.25)
GFR, Est African American: 76 mL/min (ref >=60)
GFR, Est Non African American: 66 mL/min (ref >=60)
Glucose, Bld: 92 mg/dL (ref 65–99)
Potassium: 4.3 mmol/L (ref 3.5–5.3)
Sodium: 137 mmol/L (ref 135–146)
Total Bilirubin: 0.5 mg/dL (ref 0.2–1.2)
Total Protein: 7.2 g/dL (ref 6.1–8.1)

## 2016-10-03 LAB — CBC WITH DIFFERENTIAL/PLATELET
Basophils Absolute: 0 cells/uL (ref 0–200)
Basophils Relative: 0 %
Eosinophils Absolute: 365 cells/uL (ref 15–500)
Eosinophils Relative: 5 %
HCT: 41.4 % (ref 38.5–50.0)
Hemoglobin: 12.9 g/dL — ABNORMAL LOW (ref 13.2–17.1)
Lymphocytes Relative: 31 %
Lymphs Abs: 2263 cells/uL (ref 850–3900)
MCH: 21.5 pg — ABNORMAL LOW (ref 27.0–33.0)
MCHC: 31.2 g/dL — ABNORMAL LOW (ref 32.0–36.0)
MCV: 69 fL — ABNORMAL LOW (ref 80.0–100.0)
Monocytes Absolute: 803 cells/uL (ref 200–950)
Monocytes Relative: 11 %
Neutro Abs: 3869 cells/uL (ref 1500–7800)
Neutrophils Relative %: 53 %
Platelets: 250 10*3/uL (ref 140–400)
RBC: 6 MIL/uL — ABNORMAL HIGH (ref 4.20–5.80)
RDW: 16.7 % — ABNORMAL HIGH (ref 11.0–15.0)
WBC: 7.3 10*3/uL (ref 3.8–10.8)

## 2016-10-03 LAB — PROTIME-INR
INR: 1
Prothrombin Time: 10.6 s (ref 9.0–11.5)

## 2016-10-03 LAB — HIV ANTIBODY (ROUTINE TESTING W REFLEX): HIV 1&2 Ab, 4th Generation: NONREACTIVE

## 2016-10-03 LAB — HEPATITIS B SURFACE ANTIGEN: Hepatitis B Surface Ag: NEGATIVE

## 2016-10-03 LAB — HEPATITIS B SURFACE ANTIBODY,QUALITATIVE: Hep B S Ab: NEGATIVE

## 2016-10-03 MED ORDER — LEDIPASVIR-SOFOSBUVIR 90-400 MG PO TABS: 1.0000 | ORAL_TABLET | Freq: Every day | ORAL | 2 refills | Status: DC

## 2016-10-03 NOTE — Progress Notes (Signed)
[facility]   CC: consideration for treatment for chronic hepatitis C  HPI:  +Greg Flowers is a 61 y.o. male who presents for re-evaluation and management of chronic hepatitis C.  Patient tested positive about 25 years ago. Hepatitis C-associated risk factors present are: IV drug abuse (details: in the 1980s). Patient denies history of blood transfusion, intranasal drug use. Patient has had other studies performed. Results: hepatitis C RNA by PCR, result: positive. Patient has not had prior treatment for Hepatitis C. Patient does not have a past history of liver disease. Patient does not have a family history of liver disease. Patient does not  have associated signs or symptoms related to liver disease.  Labs reviewed and confirm chronic hepatitis C with a positive viral load.   Records reviewed from Epic. He had a back surgery since I saw him in 2015 and deferred treatment.        Patient does have documented immunity to Hepatitis A. Patient does not have documented immunity to Hepatitis B.    Review of Systems:   Constitutional: negative for fatigue and malaise Gastrointestinal: negative for diarrhea Integument/breast: negative for rash All other systems reviewed and are negative       Past Medical History:  Diagnosis Date  . Anxiety   . Calculus, kidney 2000   Sees Dr. Isabel CapriceGrapey, urology.   . Cervical spondylosis 2004   sp surgery by Dr. Danielle DessElsner  . Depression   . Headache(784.0)    Chronic, on vicodin 5 TID for this.   . Hepatitis C    Has occasional visits with Dr. Randa EvensEdwards [facility], failed RX in 2000.  Liver Biopsy 9/06: Minimally active hepatitis consistent with hepatitis C, minimal necroinflamtory activity grade 1, no incrased fibrosis stage 0.   . Herpes labialis   . Hypertension   . Pneumonia   . Renal stone   . Spondylolisthesis of lumbar region   . Thalassemia    HGB 9/09 14.4 wtih MCV 72.6  . Wears glasses     Prior to Admission  medications   Medication Sig Start Date End Date Taking? Authorizing Provider  amLODipine (NORVASC) 10 MG tablet Take 1 tablet (10 mg total) by mouth daily. 01/09/16  Yes Althia FortsJohnson, Adam, MD  Ascorbic Acid (VITAMIN C) 1000 MG tablet Take 1,000 mg by mouth daily.   Yes [provider]  atenolol (TENORMIN) 50 MG tablet TAKE 1 TABLET (50 MG TOTAL) BY MOUTH DAILY. 01/09/16  Yes Althia FortsJohnson, Adam, MD  benazepril (LOTENSIN) 40 MG tablet Take 2 tablets (80 mg total) by mouth daily. 01/09/16  Yes Althia FortsJohnson, Adam, MD  FLUoxetine (PROZAC) 40 MG capsule Take 120 mg by mouth at bedtime.  10/25/10  Yes [provider]  Garlic 2000 MG CAPS Take 4,000 mg by mouth daily.   Yes [provider]  hydrochlorothiazide (HYDRODIURIL) 25 MG tablet TAKE 1 TABLET (25 MG TOTAL) BY MOUTH DAILY. 01/09/16  Yes Althia FortsJohnson, Adam, MD  Multiple Vitamins-Minerals ([employer] SPECTRAVITE ULTRA MEN 50+ PO) Take 1 tablet by mouth daily.   Yes [provider]  omeprazole (PRILOSEC) 20 MG capsule Take 1 capsule (20 mg total) by mouth daily. 09/20/16  Yes Alm Bustard'Sullivan, Matthew, MD  sodium chloride (OCEAN) 0.65 % SOLN nasal spray Place 2 sprays into both nostrils as needed for congestion.   Yes [provider]  Tamsulosin HCl (FLOMAX) 0.4 MG CAPS Take 0.4 mg by mouth at bedtime.     Yes [provider]  Tetrahydrozoline HCl (VISINE OP) Place 2 drops into both eyes as needed (for dry eyes).   Yes [provider]  dicyclomine (BENTYL) 20 MG tablet Take 1 tablet (20 mg total) by mouth 2 (two) times daily. Patient not taking: Reported on 10/03/2016 09/08/16   Jaynie Crumble, PA-C  Ledipasvir-Sofosbuvir (HARVONI) 90-400 MG TABS Take 1 tablet by mouth daily. 10/03/16   Comer, Belia Heman, MD  zolpidem (AMBIEN) 5 MG tablet Take 5 mg by mouth at bedtime as needed for sleep.    [provider]    No Known Allergies  Social History  Substance Use Topics  . Smoking status: Former Games developer  . Smokeless  tobacco: Never Used     Comment: quit in the early 1980's  . Alcohol use Yes     Comment: Occasionally    Family History  Problem Relation Age of Onset  . Hypertension Mother   . Hypertension Father      Objective:  Constitutional: in no apparent distress and alert,  Vitals:   10/03/16 1040  BP: (!) 145/101  Pulse: 85  Temp: 97.6 F (36.4 C)   Eyes: anicteric Cardiovascular: Cor RRR Respiratory: CTA B; normal respiratory effort Gastrointestinal: Bowel sounds are normal, liver is not enlarged, spleen is not enlarged, soft, nt Musculoskeletal: no pedal edema noted Skin: negatives: no rash; no porphyria cutanea tarda Lymphatic: no cervical lymphadenopathy   Laboratory Genotype:  Lab Results  Component Value Date   HCVGENOTYPE 1b 09/09/2013   HCV viral load:  Lab Results  Component Value Date   HCVQUANT 6,962,952 (H) 09/09/2013   Lab Results  Component Value Date   WBC 10.5 09/08/2016   HGB 13.2 09/08/2016   HCT 39.9 09/08/2016   MCV 67.9 (L) 09/08/2016   PLT 237 09/08/2016    Lab Results  Component Value Date   CREATININE 1.17 09/20/2016   BUN 14 09/20/2016   NA 138 09/20/2016   K 3.4 (L) 09/20/2016   CL 101 09/20/2016   CO2 29 09/20/2016    Lab Results  Component Value Date   ALT 33 09/12/2016   AST 29 09/12/2016   ALKPHOS 67 09/12/2016     Labs and history reviewed and show CHILD-PUGH A  5-6 points: Child class A 7-9 points: Child class B 10-15 points: Child class C  Lab Results  Component Value Date   INR 0.98 11/23/2013   BILITOT 0.4 09/12/2016   ALBUMIN 4.2 09/12/2016     Assessment: New Patient with Chronic Hepatitis C genotype 1b, untreated.  I discussed with the patient the lab findings that confirm chronic hepatitis C as well as the natural history and progression of disease including about 30% of people who develop cirrhosis of the liver if left untreated and once cirrhosis is established there is a 2-7% risk per year of liver  cancer and liver failure.  I discussed the importance of treatment and benefits in reducing the risk, even if significant liver fibrosis exists.   Plan: 1) Patient counseled extensively on limiting acetaminophen to no more than 2 grams daily, avoidance of alcohol. 2) Transmission discussed with patient including sexual transmission, sharing razors and toothbrush.   3) Will need referral to gastroenterology if concern for cirrhosis 4) Will need referral for substance abuse counseling: No.; Further work up to include urine drug screen  No. 5) Will prescribe Harvoni for 12 weeks for someone previously treated with PEG/riba for 48 weeks 6) Hepatitis A vaccine No. 7) Hepatitis B vaccine titer 8)  Pneumovax vaccine next visit 9) Further work up to include liver staging with elastography - will recheck after 3 years to see if he needs screening 10) will follow up after starting medication

## 2016-10-03 NOTE — Patient Instructions (Signed)
Date 10/03/16  Dear Mr. Kuenzi, As discussed in the ID Clinic, your hepatitis C therapy will include highly effective medication(s) for treatment and will vary based on the type of hepatitis C and insurance approval.  Potential medications include:          Harvoni (sofosbuvir 90mg /ledipasvir 400mg ) tablet oral daily          OR     Epclusa (sofosbuvir 400mg /velpatasvir 100mg ) tablet oral daily          OR      Mavyret (glecaprevir 100 mg/pibrentasvir 40 mg): Take 3 tablets oral daily                      Medications are typically for 12 weeks total ---------------------------------------------------------------- Your HCV Treatment Start Date: You will be notified by our office once the medication is approved and where you can pick it up (or if mailed)   ---------------------------------------------------------------- YOUR PHARMACY CONTACT:   [facility] [address] [place], [place] [address] Phone: [phone] Hours: Monday to Friday 7:30 am to 6:00 pm   Please always contact your pharmacy at least 3-4 business days before you run out of medications to ensure your next month's medication is ready or 1 week prior to running out if you receive it by mail.  Remember, each prescription is for 28 days. ---------------------------------------------------------------- GENERAL NOTES REGARDING YOUR HEPATITIS C MEDICATION:  Some medications have the following interactions:  - Acid reducing agents such as H2 blockers (ie. Pepcid (famotidine), Zantac (ranitidine), Tagamet (cimetidine), Axid (nizatidine) and proton pump inhibitors (ie. Prilosec (omeprazole), Protonix (pantoprazole), Nexium (esomeprazole), or Aciphex (rabeprazole)). Do not take until you have discussed with a health care provider.    -Antacids that contain magnesium and/or aluminum hydroxide (ie. Milk of Magensia, Rolaids, Gaviscon, Maalox, Mylanta, an dArthritis Pain Formula).  -Calcium carbonate (calcium  supplements or antacids such as Tums, Caltrate, Os-Cal).  -St. John's wort or any products that contain St. John's wort like some herbal supplements  Please inform the office prior to starting any of these medications.  - The common side effects associated with Harvoni include:      1. Fatigue      2. Headache      3. Nausea      4. Diarrhea      5. Insomnia  Please note that this only lists the most common side effects and is NOT a comprehensive list of the potential side effects of these medications. For more information, please review the drug information sheets that come with your medication package from the pharmacy.  ---------------------------------------------------------------- GENERAL HELPFUL HINTS ON HCV THERAPY: 1. Stay well-hydrated. 2. Notify the ID Clinic of any changes in your other over-the-counter/herbal or prescription medications. 3. If you miss a dose of your medication, take the missed dose as soon as you remember. Return to your regular time/dose schedule the next day.  4.  Do not stop taking your medications without first talking with your healthcare provider. 5.  You may take Tylenol (acetaminophen), as long as the dose is less than 2000 mg (OR no more than 4 tablets of the Tylenol Extra Strengths 500mg  tablet) in 24 hours. 6.  You will see our pharmacist-specialist within the first 2 weeks of starting your medication to monitor for any possible side effects. 7.  You will have labs once during treatment, soon after treatment completion and one final lab 6 months after treatment completion to verify the virus is out  of your system.  Staci RighterOMER, ROBERT, MD  [facility] [address] [place], [place]  [id number] [phone]

## 2016-10-03 NOTE — Addendum Note (Signed)
Addended by: Wendall MolaOCKERHAM, JACQUELINE A on: 10/03/2016 01:43 PM   Modules accepted: Orders

## 2016-10-03 NOTE — Telephone Encounter (Signed)
Patient notified of appt for ultrasound on 10/23/16 at 8:45 AM at [facility]. Nothing to eat or drink after midnight. Greg MolaJacqueline Cockerham CMA

## 2016-10-03 NOTE — Telephone Encounter (Signed)
amLODipine (NORVASC) 10 MG tablet,  atenolol (TENORMIN) 50 MG tablet,  hydrochlorothiazide (HYDRODIURIL) 25 MG tablet  benazepril (LOTENSIN) 40 MG tablet, refill request.

## 2016-10-04 ENCOUNTER — Ambulatory Visit (INDEPENDENT_AMBULATORY_CARE_PROVIDER_SITE_OTHER): Payer: Federal, State, Local not specified - PPO | Admitting: Internal Medicine

## 2016-10-04 VITALS — BP 134/84 | HR 69 | Temp 97.7°F | Ht 69.0 in | Wt 203.8 lb

## 2016-10-04 DIAGNOSIS — N200 Calculus of kidney: Secondary | ICD-10-CM

## 2016-10-04 DIAGNOSIS — M5414 Radiculopathy, thoracic region: Secondary | ICD-10-CM | POA: Diagnosis not present

## 2016-10-04 DIAGNOSIS — I129 Hypertensive chronic kidney disease with stage 1 through stage 4 chronic kidney disease, or unspecified chronic kidney disease: Secondary | ICD-10-CM | POA: Diagnosis not present

## 2016-10-04 DIAGNOSIS — B182 Chronic viral hepatitis C: Secondary | ICD-10-CM | POA: Diagnosis not present

## 2016-10-04 DIAGNOSIS — N189 Chronic kidney disease, unspecified: Secondary | ICD-10-CM | POA: Diagnosis not present

## 2016-10-04 DIAGNOSIS — Z981 Arthrodesis status: Secondary | ICD-10-CM | POA: Diagnosis not present

## 2016-10-04 DIAGNOSIS — Z87891 Personal history of nicotine dependence: Secondary | ICD-10-CM

## 2016-10-04 MED ORDER — LIDOCAINE 5 % EX PTCH: 1.0000 | MEDICATED_PATCH | Freq: Two times a day (BID) | CUTANEOUS | 0 refills | Status: DC

## 2016-10-04 MED ORDER — BENAZEPRIL HCL 40 MG PO TABS: 80.0000 mg | ORAL_TABLET | Freq: Every day | ORAL | 2 refills | Status: DC

## 2016-10-04 MED ORDER — HYDROCHLOROTHIAZIDE 25 MG PO TABS: ORAL_TABLET | ORAL | 2 refills | Status: DC

## 2016-10-04 MED ORDER — ATENOLOL 50 MG PO TABS: ORAL_TABLET | ORAL | 2 refills | Status: DC

## 2016-10-04 MED ORDER — GABAPENTIN 100 MG PO CAPS: 100.0000 mg | ORAL_CAPSULE | Freq: Three times a day (TID) | ORAL | 2 refills | Status: DC

## 2016-10-04 MED ORDER — AMLODIPINE BESYLATE 10 MG PO TABS: 10.0000 mg | ORAL_TABLET | Freq: Every day | ORAL | 2 refills | Status: DC

## 2016-10-04 NOTE — Progress Notes (Signed)
   CC: "My belly's still hurting."  HPI:  Greg Flowers is a 61 y.o. man with history of hypertension, nephrolithiasis, chronic HCV, and CKD who presents for follow-up of right-sided back and abdominal pain.   Past Medical History:  Diagnosis Date  . Anxiety   . Calculus, kidney 2000   Sees Dr. Isabel CapriceGrapey, urology.   . Cervical spondylosis 2004   sp surgery by Dr. Danielle DessElsner  . Depression   . Headache(784.0)    Chronic, on vicodin 5 TID for this.   . Hepatitis C    Has occasional visits with Dr. Randa EvensEdwards [facility], failed RX in 2000.  Liver Biopsy 9/06: Minimally active hepatitis consistent with hepatitis C, minimal necroinflamtory activity grade 1, no incrased fibrosis stage 0.   . Herpes labialis   . Hypertension   . Pneumonia   . Renal stone   . Spondylolisthesis of lumbar region   . Thalassemia    HGB 9/09 14.4 wtih MCV 72.6  . Wears glasses    Review of Systems:   Review of Systems  Constitutional: Negative for fever.  Respiratory: Negative for cough.   Cardiovascular: Negative for chest pain and palpitations.  Gastrointestinal: Positive for abdominal pain. Negative for blood in stool, constipation, diarrhea, nausea and vomiting.  Genitourinary: Positive for flank pain and frequency. Negative for dysuria and hematuria.  Musculoskeletal: Positive for back pain. Negative for falls and myalgias.  Neurological: Negative for sensory change, focal weakness and headaches.     Physical Exam:  Vitals:   10/04/16 0937  BP: 134/84  Pulse: 69  Temp: 97.7 F (36.5 C)  TempSrc: Oral  SpO2: 97%  Weight: 203 lb 12.8 oz (92.4 kg)  Height: 5\' 9"  (1.753 m)    Physical Exam  Constitutional: He is oriented to person, place, and time. He appears well-developed and well-nourished. No distress.  Cardiovascular: Normal rate and regular rhythm.   Pulmonary/Chest: Effort normal and breath sounds normal.  Abdominal: Soft. He exhibits no distension and no mass.  No ventral or inguinal hernia    Genitourinary:  Genitourinary Comments: No CVA tenderness  Musculoskeletal:  Mild tenderness to palpation over the right back, flank, abdomen radiating to the umbilicus and T9-10 distribution No thigh tenderness  Neurological: He is alert and oriented to person, place, and time.  Skin: No rash noted. No erythema.  Psychiatric: He has a normal mood and affect. His behavior is normal.    Assessment & Plan:   See Encounters Tab for problem based charting.  Patient discussed with Dr. Oswaldo DoneVincent

## 2016-10-04 NOTE — Progress Notes (Signed)
Internal Medicine Clinic Attending  Case discussed with Dr. O'Sullivan at the time of the visit.  We reviewed the resident's history and exam and pertinent patient test results.  I agree with the assessment, diagnosis, and plan of care documented in the resident's note. 

## 2016-10-04 NOTE — Assessment & Plan Note (Addendum)
[name] has now had right back, flank, and abdominal pain for the last month.  Initially it was associated with nausea and some vomiting, but he has not had any GI symptoms now for the last couple of weeks. It is constant, 7 out of 10 in intensity, and he describes it as a fullness or aching in his abdomen. Movement is the main exacerbating factor for his pain, and it is not associated with eating or bowel movements.  He has tried Tylenol PPI without any relief.  He has a history of cervical and lumbar spine impingement with an L4-5 fusion in about one year ago with symptoms of right leg pain that resolved after surgery.   A/P Well the etiology of his pain remains unclear, overall I now have highest suspicion for a thoracic radiculopathy resulting in neuropathic pain in dermatomal distribution and this man with known degenerative spine disease. He has no history of rash with makes shingles and postherpetic neuralgia less likely, and no segmental abdominal paralysis.  At this point I think intra-abdominal pathology has been largely excluded but the stability of his symptoms, normal abdominal ultrasound, and lack of systemic illness.   He has nonobstructive renal stones, but his pain is not colicky and his urinalysis was negative for microscopic hematuria last visit so I do not think this is from nephrolithiasis. He also had a normal postvoid residual bladder scan last visit, so I do not think he is retaining urine.  -Lidocaine patches -Gabapentin 100 mg 3 times a day -Discontinue PPI -MRI T-spine -Return to clinic in one month

## 2016-10-04 NOTE — Patient Instructions (Addendum)
I'm still not exactly sure what is causing the pain in your back and belly. I think at this point we have made sure that it isn't anything dangerous in your liver, gallbladder, appendix, or anything else in your belly.   One possibility is that it could be a pinched nerve in your spine that is causing pain that wraps around from your back to your belly. We know that you have a history of arthritis in her spine, but have not looked higher up in your back. I would like to order an MRI of the middle of your back to see if this could be what's going on.  In the meantime, I have written you a prescription for lidocaine patches to try and put on the area. You can also try rubbing something like BenGay or IcyHot on the area when you don't have a patch on.  I don't think the Tylenol is going to help this kind of pain, so don't feel like you have to take it if you don't take it's helping. I also do not think that this is anything like acid reflux, so stopped taking the omeprazole.  If anything new comes up, like fevers, chills, worsening nausea, vomiting, or weakness in your legs, please come back to clinic.

## 2016-10-05 ENCOUNTER — Encounter: Payer: Self-pay | Admitting: Internal Medicine

## 2016-10-05 DIAGNOSIS — R351 Nocturia: Secondary | ICD-10-CM | POA: Diagnosis not present

## 2016-10-07 LAB — LIVER FIBROSIS, FIBROTEST-ACTITEST
ALT: 35 U/L (ref 9–46)
Alpha-2-Macroglobulin: 340 mg/dL — ABNORMAL HIGH (ref 106–279)
Apolipoprotein A1: 182 mg/dL — ABNORMAL HIGH (ref 94–176)
Bilirubin: 0.3 mg/dL (ref 0.2–1.2)
Fibrosis Score: 0.44
GGT: 174 U/L — ABNORMAL HIGH (ref 3–70)
Haptoglobin: 228 mg/dL — ABNORMAL HIGH (ref 43–212)
Necroinflammat ACT Score: 0.22
Reference ID: 2001377

## 2016-10-08 LAB — HCV RNA, QN PCR RFLX GENO, LIPA
HCV RNA, PCR, QN: 3420000 IU/mL — ABNORMAL HIGH
HCV RNA, PCR, QN: 6.53 log IU/mL — ABNORMAL HIGH

## 2016-10-08 LAB — HEPATITIS C GENOTYPE

## 2016-10-16 ENCOUNTER — Telehealth: Payer: Self-pay

## 2016-10-16 ENCOUNTER — Encounter: Payer: Self-pay | Admitting: Pharmacy Technician

## 2016-10-16 ENCOUNTER — Other Ambulatory Visit: Payer: Self-pay | Admitting: Internal Medicine

## 2016-10-16 MED FILL — HARVONI 90-400 MG TABLET: 90-400 | 28 days supply | Qty: 28 | Fill #0

## 2016-10-16 NOTE — Telephone Encounter (Signed)
Called patient, he did not mean to request refill of omeprazole, in additon this could interfer with Hep C tx so I discussed with him I will refuse refill. He did let me know that he is still having significant back pain, has MRI scheduled for next week.  Using lidocaine patches as well as gabapentin without much relief.  Asking if he could come in for an appointment for reeval on Thursday (after 11:30), I told him we would [facility].

## 2016-10-18 ENCOUNTER — Ambulatory Visit (INDEPENDENT_AMBULATORY_CARE_PROVIDER_SITE_OTHER): Payer: Federal, State, Local not specified - PPO | Admitting: Internal Medicine

## 2016-10-18 VITALS — BP 129/81 | HR 72 | Temp 98.2°F | Ht 69.0 in | Wt 210.2 lb

## 2016-10-18 DIAGNOSIS — M5414 Radiculopathy, thoracic region: Secondary | ICD-10-CM

## 2016-10-18 DIAGNOSIS — N289 Disorder of kidney and ureter, unspecified: Secondary | ICD-10-CM | POA: Diagnosis not present

## 2016-10-18 DIAGNOSIS — M545 Low back pain, unspecified: Secondary | ICD-10-CM | POA: Insufficient documentation

## 2016-10-18 DIAGNOSIS — Z981 Arthrodesis status: Secondary | ICD-10-CM | POA: Diagnosis not present

## 2016-10-18 DIAGNOSIS — M546 Pain in thoracic spine: Secondary | ICD-10-CM

## 2016-10-18 MED ORDER — TRAMADOL HCL 50 MG PO TABS: 50.0000 mg | ORAL_TABLET | Freq: Two times a day (BID) | ORAL | 0 refills | Status: DC | PRN

## 2016-10-18 NOTE — Assessment & Plan Note (Signed)
Back pain:  Assessment: Pt with history of degenerative and nerve impingement at lumbar spinal level s/p surgery, now with 2.5 months of worsening pain in both thoraco, lumbar, and flank regions, as well as hip pain. There are no radicular sx or other signs of neurologic manifestations on exam. Workup for abdominal pathology or nephrolithiasis has been negative including imaging. This may be more degenerative changes of other spinal levels. His upcoming MRI and follow up with his neurosurgeon may be able to better identify a cause for his pain.   Plan: -Short course of Tramadol 50 mg BID PRN  -F/u MRI results -F/u in clinic in 2-3 weeks to reassess and review results

## 2016-10-18 NOTE — Progress Notes (Signed)
   CC: Hip and back pain  HPI:  [name] is a 61 y.o. with a past medical history as described below who presents with complaints of hip and back pain.   This pain has been present since mid-May. He states it initially involved his R abdomen, and has since worsened and migrated to his back and hips. The pain is constant, worsens with movement, and has started to interfere with sleep. [facility] and here in clinic (last 6/21) without a clear etiology. Previously prescribed gabapentin and lidocaine patches and wearing a supportive back brace have provided minimal relief. Denies numbness/tingling or weakness in LEs, saddle anesthesia, bladder or bowel incontinence.   Of note, he has a history of L4-L5 fusion with neurosurgery, follow up scheduled in August per patient report. He also has a thoracic spine MRI scheduled for 7/10 to further investigate his pain.   Past Medical History:  Diagnosis Date  . Anxiety   . Calculus, kidney 2000   Sees Dr. Isabel CapriceGrapey, urology.   . Cervical spondylosis 2004   sp surgery by Dr. Danielle DessElsner  . Depression   . Headache(784.0)    Chronic, on vicodin 5 TID for this.   . Hepatitis C    Has occasional visits with Dr. Randa [facility]ensEdwards [facility], failed RX in 2000.  Liver Biopsy 9/06: Minimally active hepatitis consistent with hepatitis C, minimal necroinflamtory activity grade 1, no incrased fibrosis stage 0.   . Herpes labialis   . Hypertension   . Pneumonia   . Renal stone   . Spondylolisthesis of lumbar region   . Thalassemia    HGB 9/09 14.4 wtih MCV 72.6  . Wears glasses    Review of Systems:  Review of Systems  Constitutional: Negative for fever and malaise/fatigue.  Gastrointestinal: Negative for nausea and vomiting.  Musculoskeletal: Positive for back pain. Negative for falls.  Neurological: Negative for tingling, sensory change and focal weakness.    Physical Exam:  Vitals:   10/18/16 1432  BP: 129/81  Pulse: 72  Temp:  98.2 F (36.8 C)  TempSrc: Oral  SpO2: 97%  Weight: 210 lb 3.2 oz (95.3 kg)  Height: 5\' 9"  (1.753 m)   Physical Exam  Constitutional: He is oriented to person, place, and time. He appears well-developed and well-nourished.  Musculoskeletal:  No tenderness midline of C,T, or L spine. Tenderness to palpation of paraspinal muscles of thoracic and lumbar spine, R flank, and R lateral hip   Neurological: He is alert and oriented to person, place, and time.  Bilateral LEs have sensation intact, 5/5 strength, brisk patellar reflexes, no clonus.      Assessment & Plan:   See Encounters Tab for problem based charting.  Patient seen with Dr. Oswaldo DoneVincent

## 2016-10-18 NOTE — Patient Instructions (Addendum)
Nice to meet you [name]. We are going to give a short term supply of Tramadol to see if this can get your pain under better control to allow you to sleep better and not interfere with your day.   Continue to use over the counter Icyhot, Bengay and Advil for further relief. We would like to see you back in about 2 weeks to see how you're doing and review your MRI results.

## 2016-10-18 NOTE — Telephone Encounter (Signed)
done

## 2016-10-19 ENCOUNTER — Encounter: Payer: Self-pay | Admitting: Internal Medicine

## 2016-10-19 NOTE — Progress Notes (Signed)
Internal Medicine Clinic Attending  I saw and evaluated the patient.  I personally confirmed the key portions of the history and exam documented by Dr. Anthonette LegatoHarden and I reviewed pertinent patient test results.  The assessment, diagnosis, and plan were formulated together and I agree with the documentation in the resident's note.  Patient with recent history of L4-5 laminectomy with fusion for multifactorial canal stenosis, now here with progressive thoracic spine back pain with radicular symptoms around the right flank, to the right abdomen, and down to the right lateral hip. On exam there is no weakness, though he has a slow gait. Wears a back brace. He is hyper-reflexic at the patella and achilles. No ankle or patellar clonus. No significant improvement with NSAIDs, and starting to limit functional status. No other red flags. Plan is for MRI thoracic spine to look for right sided nerve root impingement or canal stenosis, especially given hyper-reflexia. He will also follow up with his neurosurgeon next month. We prescribed a short course of as needed Tramadol for acute pain now, I don't think he will need this as a chronic medication.

## 2016-10-23 ENCOUNTER — Ambulatory Visit (HOSPITAL_COMMUNITY)
Admission: RE | Admit: 2016-10-23 | Discharge: 2016-10-23 | Disposition: A | Discharge status: Home / Self Care | Payer: Federal, State, Local not specified - PPO | Source: Ambulatory Visit | Attending: Internal Medicine | Admitting: Internal Medicine

## 2016-10-23 ENCOUNTER — Ambulatory Visit (HOSPITAL_COMMUNITY)
Admission: RE | Admit: 2016-10-23 | Discharge: 2016-10-23 | Disposition: A | Discharge status: Home / Self Care | Payer: Federal, State, Local not specified - PPO | Source: Ambulatory Visit | Attending: Student in an Organized Health Care Education/Training Program | Admitting: Student in an Organized Health Care Education/Training Program

## 2016-10-23 DIAGNOSIS — B182 Chronic viral hepatitis C: Secondary | ICD-10-CM | POA: Diagnosis not present

## 2016-10-23 DIAGNOSIS — M5124 Other intervertebral disc displacement, thoracic region: Secondary | ICD-10-CM | POA: Insufficient documentation

## 2016-10-23 DIAGNOSIS — B1920 Unspecified viral hepatitis C without hepatic coma: Secondary | ICD-10-CM | POA: Diagnosis not present

## 2016-10-23 DIAGNOSIS — N200 Calculus of kidney: Secondary | ICD-10-CM | POA: Diagnosis not present

## 2016-10-23 DIAGNOSIS — M5414 Radiculopathy, thoracic region: Secondary | ICD-10-CM

## 2016-10-24 ENCOUNTER — Telehealth: Payer: Self-pay | Admitting: Student in an Organized Health Care Education/Training Program

## 2016-10-25 NOTE — Telephone Encounter (Signed)
I spoke with patient over the phone about the T spine MRI results; showed mild to moderate facet disease throughout t spine, no definite nerve impingement seen, but enough disease to reasonably explain his upper back pain with radiculopathy around the flank. I don't think we have a better explanation, after work up that included CT abdomen, labs, US. I advised continuing with supportive care, tramadol as needed, and he will follow up with this neurosurgeon.

## 2016-11-01 ENCOUNTER — Ambulatory Visit: Payer: Federal, State, Local not specified - PPO

## 2016-11-05 ENCOUNTER — Ambulatory Visit (INDEPENDENT_AMBULATORY_CARE_PROVIDER_SITE_OTHER): Payer: Federal, State, Local not specified - PPO | Admitting: Pharmacist

## 2016-11-05 DIAGNOSIS — R110 Nausea: Secondary | ICD-10-CM

## 2016-11-05 DIAGNOSIS — B182 Chronic viral hepatitis C: Secondary | ICD-10-CM

## 2016-11-05 MED ORDER — ONDANSETRON 4 MG PO TBDP: 4.0000 mg | ORAL_TABLET | Freq: Three times a day (TID) | ORAL | 1 refills | Status: DC | PRN

## 2016-11-05 NOTE — Progress Notes (Signed)
HPI: Greg Flowers is a 61 y.o. male who presents to the [facility] pharmacy clinic for follow-up of his Hep C infection. Greg Flowers has genotype 1b, F3/F4 on ARFI, is treatment experienced with peg/riba x 48 weeks ~15 years ago, and started 12 weeks of Harvoni ~7/7.   Lab Results  Component Value Date   HCVGENOTYPE 1b 10/03/2016    Allergies: No Known Allergies  Past Medical History: Past Medical History:  Diagnosis Date  . Anxiety   . Calculus, kidney 2000   Sees Dr. Isabel Caprice, urology.   . Cervical spondylosis 2004   sp surgery by Dr. Danielle Dess  . Depression   . Headache(784.0)    Chronic, on vicodin 5 TID for this.   . Hepatitis C    Has occasional visits with Dr. Randa Evens GI, failed RX in 2000.  Liver Biopsy 9/06: Minimally active hepatitis consistent with hepatitis C, minimal necroinflamtory activity grade 1, no incrased fibrosis stage 0.   . Herpes labialis   . Hypertension   . Pneumonia   . Renal stone   . Spondylolisthesis of lumbar region   . Thalassemia    HGB 9/09 14.4 wtih MCV 72.6  . Wears glasses     Social History: Social History   Social History  . Marital status: Married    Spouse name: N/A  . Number of children: N/A  . Years of education: N/A   Social History Main Topics  . Smoking status: Former Games developer  . Smokeless tobacco: Never Used     Comment: quit in the early 1980's  . Alcohol use Yes     Comment: Occasionally  . Drug use: No  . Sexual activity: Yes    Partners: Female   Other Topics Concern  . Not on file   Social History Narrative   Works at post office, Married, Exercises.     Labs: Hep B S Ab (no units)  Date Value  10/03/2016 NEG   Hepatitis B Surface Ag (no units)  Date Value  10/03/2016 NEGATIVE    Lab Results  Component Value Date   HCVGENOTYPE 1b 10/03/2016    Hepatitis C RNA quantitative Latest Ref Rng & Units 09/09/2013  HCV Quantitative <15 IU/mL 1,610,960(A)  HCV Quantitative Log <1.18 log 10 6.50(H)    AST  Date Value   10/03/2016 31 U/L  09/12/2016 29 IU/L  09/08/2016 28 U/L   ALT  Date Value  10/03/2016 38 U/L  10/03/2016 35 U/L  09/12/2016 33 IU/L  09/08/2016 32 U/L   INR (no units)  Date Value  10/03/2016 1.0  11/23/2013 0.98    CrCl: CrCl cannot be calculated (Patient's most recent lab result is older than the maximum 21 days allowed.).  Fibrosis Score: F3/F4 as assessed by ARFI   Child-Pugh Score: A  Previous Treatment Regimen: Peg + riba x 48 weeks  Assessment: Greg Flowers is here today to follow-up for his Hep C infection.  Greg Flowers is treatment experienced and completed 48 weeks of peg/ribavirin ~15 years ago.  Greg Flowers started Harvoni around 2 weeks ago on 7/7. Greg Flowers is not experiencing many side effects except nausea. Greg Flowers does take omeprazole usually but stopped while Greg Flowers was on the Harvoni.  Greg Flowers thinks the nausea is due to acid reflux.  Will send him some Zofran to take while Greg Flowers is on Harvoni. Greg Flowers is not having any other signs of acid reflux, no headaches or fatigue, no vomiting either. Greg Flowers has missed no doses at all and is very motivated to  become cured, especially since Greg Flowers is treatment experienced. Greg Flowers has F1/F2 on fibrosure but F3/F4 on ultrasound, platelets are normal.  Explained his fibrosis score to him and spent some time going over adherence.  I will get labs today and have him come see me again at end of therapy.    Plans: - Continue Harvoni x 12 weeks  - Hep C RNA today - Zofran ODT 4 mg q8h PRN nausea [facility] - F/u with me again 10/8 at 1030am  Sohaib Vereen L. Aydien Majette, PharmD, CPP Infectious Diseases Clinical Pharmacist [facility] 11/05/2016, 4:52 PM

## 2016-11-06 ENCOUNTER — Encounter: Payer: Self-pay | Admitting: Internal Medicine

## 2016-11-06 ENCOUNTER — Ambulatory Visit (INDEPENDENT_AMBULATORY_CARE_PROVIDER_SITE_OTHER): Payer: Federal, State, Local not specified - PPO | Admitting: Internal Medicine

## 2016-11-06 ENCOUNTER — Ambulatory Visit (HOSPITAL_COMMUNITY)
Admission: RE | Admit: 2016-11-06 | Discharge: 2016-11-06 | Disposition: A | Discharge status: Home / Self Care | Payer: Federal, State, Local not specified - PPO | Source: Ambulatory Visit | Attending: Internal Medicine | Admitting: Internal Medicine

## 2016-11-06 VITALS — BP 145/94 | HR 79 | Temp 98.8°F | Ht 69.0 in | Wt 205.8 lb

## 2016-11-06 DIAGNOSIS — M545 Low back pain, unspecified: Secondary | ICD-10-CM

## 2016-11-06 DIAGNOSIS — M546 Pain in thoracic spine: Secondary | ICD-10-CM | POA: Diagnosis not present

## 2016-11-06 DIAGNOSIS — M4326 Fusion of spine, lumbar region: Secondary | ICD-10-CM | POA: Diagnosis not present

## 2016-11-06 DIAGNOSIS — M5136 Other intervertebral disc degeneration, lumbar region: Secondary | ICD-10-CM | POA: Diagnosis not present

## 2016-11-06 DIAGNOSIS — Z87891 Personal history of nicotine dependence: Secondary | ICD-10-CM | POA: Diagnosis not present

## 2016-11-06 DIAGNOSIS — N200 Calculus of kidney: Secondary | ICD-10-CM | POA: Diagnosis not present

## 2016-11-06 MED ORDER — OXYCODONE-ACETAMINOPHEN 10-325 MG PO TABS: 1.0000 | ORAL_TABLET | Freq: Three times a day (TID) | ORAL | 0 refills | Status: DC | PRN

## 2016-11-06 MED ORDER — GABAPENTIN 300 MG PO CAPS: 300.0000 mg | ORAL_CAPSULE | Freq: Three times a day (TID) | ORAL | 0 refills | Status: DC

## 2016-11-06 NOTE — Patient Instructions (Signed)
[name],  It was a pleasure meeting you today. Please follow-up and have your x-ray done of your back.  Also please obtain your images on a disc from medical records to take to your neurology appointment.

## 2016-11-06 NOTE — Progress Notes (Signed)
   CC: Acute lower back pain  HPI:  [name] is a 61 y.o. male with history noted below that presents to the acute care clinic for follow up on acute lower back pain.  Patient had a L4-L5 lumbar fusion in 01/2016 and states that he had no back pain until this may.  He states that the pain starts in his right abdomen and travels across his lower back to the left hip.  He states that doing activities like mowing the grass and standing for long periods makes the pain worse.  He has tried ibuprofen and tramadol without relief.  He states that he has an appointment with neurosurgery soon.  He denies any falls/trauma to his back, weakness, numbness/tingling or bowel/urinary dysfunction.  Past Medical History:  Diagnosis Date  . Anxiety   . Calculus, kidney 2000   Sees Dr. Isabel Flowers, urology.   . Cervical spondylosis 2004   sp surgery by Dr. Danielle Flowers  . Depression   . Headache(784.0)    Chronic, on vicodin 5 TID for this.   . Hepatitis C    Has occasional visits with Dr. Randa Flowers GI, failed RX in 2000.  Liver Biopsy 9/06: Minimally active hepatitis consistent with hepatitis C, minimal necroinflamtory activity grade 1, no incrased fibrosis stage 0.   . Herpes labialis   . Hypertension   . Pneumonia   . Renal stone   . Spondylolisthesis of lumbar region   . Thalassemia    HGB 9/09 14.4 wtih MCV 72.6  . Wears glasses     Review of Systems:  As noted per HPI  Physical Exam:  Vitals:   11/06/16 1003  BP: (!) 145/94  Pulse: 79  Temp: 98.8 F (37.1 C)  TempSrc: Oral  SpO2: 98%  Weight: 205 lb 12.8 oz (93.4 kg)  Height: 5\' 9"  (1.753 m)   Physical Exam  Constitutional: He is well-developed, well-nourished, and in no distress.  Cardiovascular: Normal rate, regular rhythm and normal heart sounds.  Exam reveals no gallop and no friction rub.   No murmur heard. Pulmonary/Chest: Effort normal and breath sounds normal. No respiratory distress. He has no wheezes. He has no rales.    Musculoskeletal: He exhibits no edema.  Paraspinal musculature tenderness and tightness to thoracic and lumbar region  Neurological:  5/5 motor strength in upper and lower extremities bilaterally     Assessment & Plan:   See encounters tab for problem based medical decision making.   Patient discussed with Dr. Criselda Flowers

## 2016-11-07 LAB — HEPATITIS C RNA QUANTITATIVE
HCV Quantitative Log: <1.18 Log IU/mL — AB
HCV Quantitative: <15 IU/mL — AB

## 2016-11-08 ENCOUNTER — Encounter: Payer: Self-pay | Admitting: Internal Medicine

## 2016-11-08 DIAGNOSIS — F321 Major depressive disorder, single episode, moderate: Secondary | ICD-10-CM | POA: Diagnosis not present

## 2016-11-11 NOTE — Assessment & Plan Note (Signed)
Assessment: back pain of thoracolumbar region Patient has a history of degenerative and nerve impingement of lumbar spine s/p L4-L5 lumbar fusion 01/2016.  He now has a 2 month history of acute worsening pain in his thoracolumbar and right abdominal region.  He has had an abdominal ultrasound on 7/10 that showed bilateral nephrolithiasis.  He had an MRI of thoracic spine that showed facet osteoarthritis throughout the thoracic region.  He has tried tramadol with little benefit.  Patient has an appointment with neurosurgery coming up.  Will obtain a lumbar x-ray and give a short course of percocet   Plan -lumbar x-ray -percocet - advised patient to follow up with neurosurgery

## 2016-11-12 ENCOUNTER — Other Ambulatory Visit: Payer: Self-pay | Admitting: Pharmacist Clinician (PhC)/ Clinical Pharmacy Specialist

## 2016-11-12 MED ORDER — LEDIPASVIR-SOFOSBUVIR 90-400 MG PO TABS: 1.0000 | ORAL_TABLET | Freq: Every day | ORAL | 1 refills | Status: DC

## 2016-11-13 NOTE — Progress Notes (Addendum)
Internal Medicine Clinic Attending  Case discussed with Dr. Mikey BussingHoffman at the time of the visit.  We reviewed the resident's history and exam and pertinent patient test results.  I agree with the assessment, diagnosis, and plan of care documented in the resident's note.  I reviewed the [place] narcotic database, and refill history seems appropriate.  Last refill for short course was 7/5 of tramadol.

## 2016-11-14 ENCOUNTER — Encounter: Payer: Self-pay | Admitting: Internal Medicine

## 2016-11-15 ENCOUNTER — Encounter: Payer: Self-pay | Admitting: Internal Medicine

## 2016-11-15 ENCOUNTER — Other Ambulatory Visit: Payer: Self-pay | Admitting: Internal Medicine

## 2016-11-15 DIAGNOSIS — R351 Nocturia: Secondary | ICD-10-CM | POA: Diagnosis not present

## 2016-11-16 ENCOUNTER — Encounter: Payer: Self-pay | Admitting: Internal Medicine

## 2016-11-16 MED ORDER — OXYCODONE-ACETAMINOPHEN 10-325 MG PO TABS: 1.0000 | ORAL_TABLET | Freq: Three times a day (TID) | ORAL | 0 refills | Status: DC | PRN

## 2016-11-16 NOTE — Telephone Encounter (Signed)
Pt informed

## 2016-11-16 NOTE — Telephone Encounter (Signed)
I would still like him to be seen by Neurosurgery to decide about MRI.  Will give 1 refill of #15.  No further refills unless seen in clinic.  [place] narcotic database reviewed and appropriate.

## 2016-11-16 NOTE — Telephone Encounter (Signed)
Can you find out when he is due to see Neurosurgery.  We were giving the narcotics as a temporizing measure until he sees them and possibly a long term plan could be worked out.  I am fine giving him another short course of medication, but I was under the impression that he would see the surgeons soon.    Thanks!

## 2016-11-16 NOTE — Telephone Encounter (Signed)
Neuro appt is 8/23 at 1000 w/ dr Danielle Desselsner

## 2016-12-06 DIAGNOSIS — M4316 Spondylolisthesis, lumbar region: Secondary | ICD-10-CM | POA: Diagnosis not present

## 2016-12-06 DIAGNOSIS — M5416 Radiculopathy, lumbar region: Secondary | ICD-10-CM | POA: Diagnosis not present

## 2016-12-11 ENCOUNTER — Other Ambulatory Visit (HOSPITAL_COMMUNITY): Payer: Self-pay | Admitting: Neurological Surgery

## 2016-12-11 ENCOUNTER — Encounter: Payer: Self-pay | Admitting: Sports Medicine

## 2016-12-11 ENCOUNTER — Ambulatory Visit (INDEPENDENT_AMBULATORY_CARE_PROVIDER_SITE_OTHER): Payer: Federal, State, Local not specified - PPO | Admitting: Sports Medicine

## 2016-12-11 DIAGNOSIS — M779 Enthesopathy, unspecified: Secondary | ICD-10-CM

## 2016-12-11 DIAGNOSIS — M79609 Pain in unspecified limb: Secondary | ICD-10-CM

## 2016-12-11 DIAGNOSIS — M79674 Pain in right toe(s): Secondary | ICD-10-CM

## 2016-12-11 DIAGNOSIS — M2020 Hallux rigidus, unspecified foot: Secondary | ICD-10-CM

## 2016-12-11 DIAGNOSIS — M79675 Pain in left toe(s): Secondary | ICD-10-CM

## 2016-12-11 DIAGNOSIS — M5416 Radiculopathy, lumbar region: Secondary | ICD-10-CM

## 2016-12-11 DIAGNOSIS — M2021 Hallux rigidus, right foot: Secondary | ICD-10-CM

## 2016-12-11 DIAGNOSIS — M2022 Hallux rigidus, left foot: Secondary | ICD-10-CM | POA: Diagnosis not present

## 2016-12-11 MED ORDER — TRIAMCINOLONE ACETONIDE 10 MG/ML IJ SUSP: 10.0000 mg | Freq: Once | INTRAMUSCULAR | Status: DC

## 2016-12-11 MED ORDER — MELOXICAM 15 MG PO TABS: 15.0000 mg | ORAL_TABLET | Freq: Every day | ORAL | 0 refills | Status: DC

## 2016-12-11 NOTE — Progress Notes (Signed)
Patient ID: Greg Flowers, male   DOB: Aug 27, 1955, 61 y.o.   MRN: [id number]   Subjective: Greg Flowers is a 61 y.o. male patient who returns to office for evaluation of Right<Left big toe joint pain. Patient reports that the injection and Mobic helped greatly; desires to have another injection today and to discuss other treatments. Patient denies any other pedal complaints.   Patient Active Problem List   Diagnosis Date Noted  . Back pain of thoracolumbar region 10/18/2016  . AKI (acute kidney injury) (HCC) 09/13/2016  . Thoracic radiculopathy 09/12/2016  . Spondylolisthesis at L4-L5 level 02/13/2016  . NSAID long-term use 01/09/2016  . Lumbar radiculopathy, acute 01/09/2016  . Preventative health care 02/12/2012  . Osteoarthritis of both knees 11/02/2011  . NECK AND BACK PAIN 11/03/2009  . FOOT PAIN, BILATERAL 09/20/2009  . HEADACHE 05/06/2009  . DISORDER, DEPRESSIVE NEC 09/25/2006  . HERPES LABIALIS 04/04/2006  . THALASSEMIA NEC 04/04/2006  . Chronic hepatitis C without hepatic coma (HCC) 02/22/2006  . Essential hypertension 02/22/2006  . CALCULUS, KIDNEY 02/22/2006  . SPONDYLOSIS, CERVICAL 02/22/2006  . COLONOSCOPY, HX OF 02/22/2006    Current Outpatient Prescriptions on File Prior to Visit  Medication Sig Dispense Refill  . amLODipine (NORVASC) 10 MG tablet Take 1 tablet (10 mg total) by mouth daily. 90 tablet 2  . Ascorbic Acid (VITAMIN C) 1000 MG tablet Take 1,000 mg by mouth daily.    Marland Kitchen atenolol (TENORMIN) 50 MG tablet TAKE 1 TABLET (50 MG TOTAL) BY MOUTH DAILY. 90 tablet 2  . benazepril (LOTENSIN) 40 MG tablet Take 2 tablets (80 mg total) by mouth daily. 180 tablet 2  . FLUoxetine (PROZAC) 40 MG capsule Take 120 mg by mouth at bedtime.     . gabapentin (NEURONTIN) 300 MG capsule Take 1 capsule (300 mg total) by mouth 3 (three) times daily. 90 capsule 0  . Garlic 2000 MG CAPS Take 4,000 mg by mouth daily.    . hydrochlorothiazide (HYDRODIURIL) 25 MG tablet TAKE 1 TABLET  (25 MG TOTAL) BY MOUTH DAILY. 90 tablet 2  . Ledipasvir-Sofosbuvir (HARVONI) 90-400 MG TABS Take 1 tablet by mouth daily. 28 tablet 1  . lidocaine (LIDODERM) 5 % Place 1 patch onto the skin every 12 (twelve) hours. Remove & Discard patch within 12 hours or as directed by MD 30 patch 0  . Multiple Vitamins-Minerals ([facility] SPECTRAVITE ULTRA MEN 50+ PO) Take 1 tablet by mouth daily.    . ondansetron (ZOFRAN ODT) 4 MG disintegrating tablet Take 1 tablet (4 mg total) by mouth every 8 (eight) hours as needed for nausea or vomiting. 30 tablet 1  . oxyCODONE-acetaminophen (PERCOCET) 10-325 MG tablet Take 1 tablet by mouth every 8 (eight) hours as needed for pain. 15 tablet 0  . sodium chloride (OCEAN) 0.65 % SOLN nasal spray Place 2 sprays into both nostrils as needed for congestion.    . Tamsulosin HCl (FLOMAX) 0.4 MG CAPS Take 0.4 mg by mouth at bedtime.      . Tetrahydrozoline HCl (VISINE OP) Place 2 drops into both eyes as needed (for dry eyes).    . traMADol (ULTRAM) 50 MG tablet Take 1 tablet (50 mg total) by mouth 2 (two) times daily as needed. 20 tablet 0  . zolpidem (AMBIEN) 5 MG tablet Take 5 mg by mouth at bedtime as needed for sleep.     No current facility-administered medications on file prior to visit.     No Known Allergies  Objective:  General: Alert and oriented x3 in no acute distress  Dermatology: No open lesions bilateral lower extremities, no webspace macerations, no ecchymosis bilateral, Mild hypopigmented areas to bilateral lower extremities, all nails x 10 are well manicured.  Vascular: Dorsalis Pedis 2/4 and Posterior Tibial pedal pulses 1/4, Capillary Fill Time 3 seconds, (+) pedal hair growth bilateral, no edema bilateral lower extremities, Temperature gradient within normal limits.  Neurology: Greg Flowers sensation intact via light touch bilateral, Protective sensation intact  with Semmes Weinstein Monofilament to all pedal sites, Position sense intact, vibratory intact  bilateral, Deep tendon reflexes within normal limits bilateral, No babinski sign present bilateral. (-) Tinels sign bilateral.  Musculoskeletal: Mild tenderness with palpation right<left exostosis at dorsal 1st MTPJ bilateral with limitation without crepitus with end range of motion, no 1st ray hypermobility noted bilateral. Midtarsal, Subtalar joint, and ankle joint range of motion is within normal limits. Pes planus foot type and mild hammertoe bilateral.       Assessment and Plan: Problem List Items Addressed This Visit    None    Visit Diagnoses    Hallux rigidus of both feet    -  Primary   Relevant Medications   meloxicam (MOBIC) 15 MG tablet   triamcinolone acetonide (KENALOG) 10 MG/ML injection 10 mg   Toe pain, bilateral       Relevant Medications   meloxicam (MOBIC) 15 MG tablet   triamcinolone acetonide (KENALOG) 10 MG/ML injection 10 mg   Capsulitis          -Complete examination performed -Discussed continued care for Hallux rigidus;conservative and  Surgical management (chielectomy vs decompression osteotomy); risks, benefits, alternatives discussed. All patient's questions answered. After oral consent and aseptic prep, injected a mixture containing 1 ml of 2%  plain lidocaine, 1 ml 0.5% plain marcaine, 0.5 ml of kenalog 10 and 0.5 ml of dexamethasone phosphate into right and left 1st MTPJs without complication. Post-injection care discussed with patient.  -Refilled Mobic  -Recommend continue with good supportive shoes and custom function foot orthotics; patient to meet with Greg Flowers for casting for orthotics  -Patient to return to office 1 month after orthotics have been received for orthotic check or sooner if condition worsens.  Asencion Islam, DPM

## 2016-12-12 ENCOUNTER — Ambulatory Visit (INDEPENDENT_AMBULATORY_CARE_PROVIDER_SITE_OTHER): Payer: Federal, State, Local not specified - PPO | Admitting: Pharmacist

## 2016-12-12 DIAGNOSIS — B182 Chronic viral hepatitis C: Secondary | ICD-10-CM

## 2016-12-12 DIAGNOSIS — R112 Nausea with vomiting, unspecified: Secondary | ICD-10-CM | POA: Diagnosis not present

## 2016-12-12 LAB — CBC
HCT: 40.9 % (ref 38.5–50.0)
Hemoglobin: 13.4 g/dL (ref 13.2–17.1)
MCH: 22.9 pg — ABNORMAL LOW (ref 27.0–33.0)
MCHC: 32.8 g/dL (ref 32.0–36.0)
MCV: 70 fL — ABNORMAL LOW (ref 80.0–100.0)
MPV: 10.2 fL (ref 7.5–12.5)
Platelets: 297 10*3/uL (ref 140–400)
RBC: 5.84 MIL/uL — ABNORMAL HIGH (ref 4.20–5.80)
RDW: 14.7 % (ref 11.0–15.0)
WBC: 11.8 10*3/uL — ABNORMAL HIGH (ref 3.8–10.8)

## 2016-12-12 LAB — BASIC METABOLIC PANEL
BUN: 18 mg/dL (ref 7–25)
CO2: 27 mmol/L (ref 20–32)
Calcium: 9.5 mg/dL (ref 8.6–10.3)
Chloride: 97 mmol/L — ABNORMAL LOW (ref 98–110)
Creat: 1.45 mg/dL — ABNORMAL HIGH (ref 0.70–1.25)
Glucose, Bld: 87 mg/dL (ref 65–99)
Potassium: 3.9 mmol/L (ref 3.5–5.3)
Sodium: 135 mmol/L (ref 135–146)

## 2016-12-12 MED ORDER — PROMETHAZINE HCL 12.5 MG PO TABS: 12.5000 mg | ORAL_TABLET | Freq: Four times a day (QID) | ORAL | 0 refills | Status: DC | PRN

## 2016-12-12 NOTE — Progress Notes (Signed)
HPI: Greg Flowers is a 61 y.o. male who presents to the [facility] pharmacy clinic today for Hep C follow-up. He has genotype 1b, F3/F4, treatment experienced with Peg/Ribavirin x 48 weeks around 15 years ago, and started 12 weeks of Harvoni on 7/7.  Lab Results  Component Value Date   HCVGENOTYPE 1b 10/03/2016    Allergies: No Known Allergies  Past Medical History: Past Medical History:  Diagnosis Date  . Anxiety   . Calculus, kidney 2000   Sees Dr. Isabel CapriceGrapey, urology.   . Cervical spondylosis 2004   sp surgery by Dr. Danielle DessElsner  . Depression   . Headache(784.0)    Chronic, on vicodin 5 TID for this.   . Hepatitis C    Has occasional visits with Dr. Randa EvensEdwards GI, failed RX in 2000.  Liver Biopsy 9/06: Minimally active hepatitis consistent with hepatitis C, minimal necroinflamtory activity grade 1, no incrased fibrosis stage 0.   . Herpes labialis   . Hypertension   . Pneumonia   . Renal stone   . Spondylolisthesis of lumbar region   . Thalassemia    HGB 9/09 14.4 wtih MCV 72.6  . Wears glasses     Social History: Social History   Social History  . Marital status: Married    Spouse name: N/A  . Number of children: N/A  . Years of education: N/A   Social History Main Topics  . Smoking status: Former Games developermoker  . Smokeless tobacco: Never Used     Comment: quit in the early 1980's  . Alcohol use Yes     Comment: Occasionally  . Drug use: No  . Sexual activity: Yes    Partners: Female   Other Topics Concern  . Not on file   Social History Narrative   Works at post office, Married, Exercises.     Labs: Hep B S Ab (no units)  Date Value  10/03/2016 NEG   Hepatitis B Surface Ag (no units)  Date Value  10/03/2016 NEGATIVE    Lab Results  Component Value Date   HCVGENOTYPE 1b 10/03/2016    Hepatitis C RNA quantitative Latest Ref Rng & Units 11/05/2016 09/09/2013  HCV Quantitative NOT DETECTED IU/mL <15 DETECTED(A) 1,610,960(A3,131,794(H)  HCV Quantitative Log NOT DETECTED Log  IU/mL <1.18 DETECTED(A) 6.50(H)    AST  Date Value  10/03/2016 31 U/L  09/12/2016 29 IU/L  09/08/2016 28 U/L   ALT  Date Value  10/03/2016 38 U/L  10/03/2016 35 U/L  09/12/2016 33 IU/L  09/08/2016 32 U/L   INR (no units)  Date Value  10/03/2016 1.0  11/23/2013 0.98    CrCl: CrCl cannot be calculated (Patient's most recent lab result is older than the maximum 21 days allowed.).  Fibrosis Score: F3/F4 as assessed by ARFI   Child-Pugh Score: A  Previous Treatment Regimen: Peg/ribavirin x 48 weeks  Assessment: Greg Flowers is here today to see me for follow-up of his Hep C infection.  I saw him around 1 month ago and he was doing well on the Harvoni and not missing any doses.  When Greg Flowers talked to him yesterday, he indicated he would be out of medications sooner than he should have been for this month.  He states he may have doubled up on a few days.  I brought him in here to discuss the issue.    He is having terrible stomach pains with nausea and vomiting. He states that he usually takes it in the morning but on the days  where he wakes up in pain, he deferred to the evening.  He thinks he may have doubled up 2 days because of this confusion. He hasn't missed any full day doses since starting. We were able to get him a bottle before he would run out so that he does not miss any doses.  His early on treatment viral load was already undetectable. With this, he will be short ~2 days of therapy. I told him it is very important to not miss any doses or double up anymore, and we will just wait and see if he is cured.  I think he'll be fine.  I'll get another Hep C viral load today including a BMET and CBC since he is treatment experienced and doubled up a few days. He will come and see me at EOT next month. I also started him on Phenergan to see if that helps with the nausea and vomiting.   Plans: - Continue Harvoni daily - Hep C viral load, CBC, BMET today - Phenergan 12.5 mg q6hr PRN  n/v - F/u with me again 10/3 at 9am  Greg Flowers L. Milina Pagett, PharmD, CPP Infectious Diseases Clinical Pharmacist [facility] 12/12/2016, 9:49 AM

## 2016-12-14 ENCOUNTER — Ambulatory Visit (HOSPITAL_COMMUNITY)
Admission: RE | Admit: 2016-12-14 | Discharge: 2016-12-14 | Disposition: A | Discharge status: Home / Self Care | Payer: Federal, State, Local not specified - PPO | Source: Ambulatory Visit | Attending: Neurological Surgery | Admitting: Neurological Surgery

## 2016-12-14 DIAGNOSIS — M5416 Radiculopathy, lumbar region: Secondary | ICD-10-CM | POA: Diagnosis not present

## 2016-12-14 DIAGNOSIS — M5127 Other intervertebral disc displacement, lumbosacral region: Secondary | ICD-10-CM | POA: Insufficient documentation

## 2016-12-14 DIAGNOSIS — M4326 Fusion of spine, lumbar region: Secondary | ICD-10-CM | POA: Insufficient documentation

## 2016-12-14 MED ORDER — GADOBENATE DIMEGLUMINE 529 MG/ML IV SOLN
15.0000 mL | Freq: Once | INTRAVENOUS | Status: AC
  Administered 2016-12-14: 15 mL via INTRAVENOUS

## 2016-12-18 ENCOUNTER — Ambulatory Visit (INDEPENDENT_AMBULATORY_CARE_PROVIDER_SITE_OTHER): Payer: Federal, State, Local not specified - PPO | Admitting: Orthotics

## 2016-12-18 DIAGNOSIS — M2021 Hallux rigidus, right foot: Secondary | ICD-10-CM | POA: Diagnosis not present

## 2016-12-18 DIAGNOSIS — M2022 Hallux rigidus, left foot: Secondary | ICD-10-CM | POA: Diagnosis not present

## 2016-12-18 LAB — HEPATITIS C RNA QUANTITATIVE
HCV Quantitative Log: <1.18 Log IU/mL
HCV Quantitative: <15 IU/mL

## 2016-12-18 NOTE — Progress Notes (Signed)
Patient came into today for casting bilateral f/o to address hallux rigidus.  Patient reports history of foot pain involving 1s. mpj b/l..  Goal is to provide longitudinal arch support and correct any RF instability due to heel eversion/inversion. Also to "lock down" first MPJ to allow less discomfort in toe off.    Plan on semi-rigid device addressing heel stability and relieving 1s MPJ tension.  Add Morton exten bilat.  Patient advised of 398 to billed to insurance.

## 2016-12-23 ENCOUNTER — Other Ambulatory Visit: Payer: Self-pay | Admitting: Pharmacist

## 2016-12-23 DIAGNOSIS — R112 Nausea with vomiting, unspecified: Secondary | ICD-10-CM

## 2016-12-24 ENCOUNTER — Telehealth: Payer: Self-pay | Admitting: *Deleted

## 2016-12-24 NOTE — Telephone Encounter (Signed)
Patient given promethazine 12.5mg  #30 on 8/29, is requesting refill 9/10. Please advise. Andree CossHowell, Kenlei Safi M, RN

## 2016-12-25 ENCOUNTER — Other Ambulatory Visit: Payer: Self-pay | Admitting: Pharmacist

## 2016-12-25 DIAGNOSIS — R112 Nausea with vomiting, unspecified: Secondary | ICD-10-CM

## 2016-12-25 NOTE — Telephone Encounter (Signed)
No problem - I'll send some in for him.

## 2016-12-26 DIAGNOSIS — F321 Major depressive disorder, single episode, moderate: Secondary | ICD-10-CM | POA: Diagnosis not present

## 2016-12-26 MED ORDER — PROMETHAZINE HCL 12.5 MG PO TABS: 12.5000 mg | ORAL_TABLET | Freq: Four times a day (QID) | ORAL | 1 refills | Status: DC | PRN

## 2016-12-27 ENCOUNTER — Other Ambulatory Visit: Payer: Self-pay | Admitting: Internal Medicine

## 2016-12-27 DIAGNOSIS — I10 Essential (primary) hypertension: Secondary | ICD-10-CM | POA: Diagnosis not present

## 2016-12-27 DIAGNOSIS — M4316 Spondylolisthesis, lumbar region: Secondary | ICD-10-CM | POA: Diagnosis not present

## 2016-12-27 DIAGNOSIS — Z6829 Body mass index (BMI) 29.0-29.9, adult: Secondary | ICD-10-CM | POA: Diagnosis not present

## 2016-12-31 DIAGNOSIS — L433 Subacute (active) lichen planus: Secondary | ICD-10-CM | POA: Diagnosis not present

## 2016-12-31 DIAGNOSIS — R21 Rash and other nonspecific skin eruption: Secondary | ICD-10-CM | POA: Diagnosis not present

## 2016-12-31 DIAGNOSIS — B0089 Other herpesviral infection: Secondary | ICD-10-CM | POA: Diagnosis not present

## 2017-01-04 ENCOUNTER — Telehealth: Payer: Self-pay | Admitting: Pharmacist Clinician (PhC)/ Clinical Pharmacy Specialist

## 2017-01-04 ENCOUNTER — Other Ambulatory Visit: Payer: Self-pay | Admitting: Internal Medicine

## 2017-01-04 ENCOUNTER — Other Ambulatory Visit: Payer: Self-pay | Admitting: Pharmacist

## 2017-01-04 NOTE — Telephone Encounter (Signed)
Greg Flowers was returning Greg Flowers's call. He is on his 3rd month now. Gave him the results of the previous 2 tests. He will f/u with [place] on 10/3 for the EOT VL.

## 2017-01-08 ENCOUNTER — Ambulatory Visit (INDEPENDENT_AMBULATORY_CARE_PROVIDER_SITE_OTHER): Payer: Self-pay | Admitting: Orthotics

## 2017-01-08 DIAGNOSIS — M2021 Hallux rigidus, right foot: Secondary | ICD-10-CM

## 2017-01-08 DIAGNOSIS — M779 Enthesopathy, unspecified: Secondary | ICD-10-CM

## 2017-01-08 DIAGNOSIS — M2022 Hallux rigidus, left foot: Secondary | ICD-10-CM

## 2017-01-08 NOTE — Progress Notes (Signed)
Patient came in today to pick up custom made foot orthotics.  The patient didn't bring in proper shoes, however he took the [facility] w/ him and was advised to report any issues/fitting or function with [place], and we will schedule a FUP appointment.

## 2017-01-11 ENCOUNTER — Other Ambulatory Visit: Payer: Self-pay | Admitting: Sports Medicine

## 2017-01-11 ENCOUNTER — Encounter: Payer: Self-pay | Admitting: Sports Medicine

## 2017-01-11 DIAGNOSIS — M79674 Pain in right toe(s): Secondary | ICD-10-CM

## 2017-01-11 DIAGNOSIS — M79675 Pain in left toe(s): Secondary | ICD-10-CM

## 2017-01-11 DIAGNOSIS — M2021 Hallux rigidus, right foot: Secondary | ICD-10-CM

## 2017-01-11 DIAGNOSIS — M2022 Hallux rigidus, left foot: Principal | ICD-10-CM

## 2017-01-14 ENCOUNTER — Telehealth: Payer: Self-pay | Admitting: *Deleted

## 2017-01-14 MED ORDER — MELOXICAM 15 MG PO TABS: 15.0000 mg | ORAL_TABLET | Freq: Every day | ORAL | 0 refills | Status: DC

## 2017-01-14 NOTE — Telephone Encounter (Signed)
Pt requested refill of Meloxicam by email. Return email informed pt, Dr. Marylene Land had wanted to see him for reevaluation 4 weeks after his 12/11/2016 appt, and gave pt [phone] to call for appt. Refilled Meloxicam for 2 weeks.

## 2017-01-16 ENCOUNTER — Ambulatory Visit (INDEPENDENT_AMBULATORY_CARE_PROVIDER_SITE_OTHER): Payer: Federal, State, Local not specified - PPO | Admitting: Pharmacist

## 2017-01-16 DIAGNOSIS — Z23 Encounter for immunization: Secondary | ICD-10-CM

## 2017-01-16 DIAGNOSIS — R112 Nausea with vomiting, unspecified: Secondary | ICD-10-CM

## 2017-01-16 DIAGNOSIS — B182 Chronic viral hepatitis C: Secondary | ICD-10-CM

## 2017-01-16 LAB — BASIC METABOLIC PANEL
BUN: 15 mg/dL (ref 7–25)
CO2: 30 mmol/L (ref 20–32)
Calcium: 9.1 mg/dL (ref 8.6–10.3)
Chloride: 97 mmol/L — ABNORMAL LOW (ref 98–110)
Creat: 1.24 mg/dL (ref 0.70–1.25)
Glucose, Bld: 116 mg/dL — ABNORMAL HIGH (ref 65–99)
Potassium: 4 mmol/L (ref 3.5–5.3)
Sodium: 134 mmol/L — ABNORMAL LOW (ref 135–146)

## 2017-01-16 MED ORDER — PROMETHAZINE HCL 12.5 MG PO TABS: 12.5000 mg | ORAL_TABLET | Freq: Four times a day (QID) | ORAL | 5 refills | Status: DC | PRN

## 2017-01-16 NOTE — Progress Notes (Deleted)
Finished Harvoni last Wednesday  Phenergan helped NV - asked for refill  Takes about once a day  Requested flu shot; neg hepB immunity in June, will start hepB series - next in 1 mo and 3rd at cure visit  Wed Nov 7 @ 0900  VL 3.4 mil before tx >> undetectable since  Check VL again today - bring back in 6 mos for cure visit (tx experienced, F3/F4)  Tues March 26 @ 0900  Scr at last visit in Aug 1.45 - increased, possibly related to dose doubling  Recheck again today

## 2017-01-16 NOTE — Progress Notes (Signed)
HPI: Greg Flowers is a 61 y.o. male who presents to the [facility] pharmacy clinic today for HepC EOT follow-up. He has genotype 1b, F3/F4, treatment experience with PEG/ribavirin x48 wks around 15 years ago, and started 12 weeks of Harvoni on 10/20/16.  Lab Results  Component Value Date   HCVGENOTYPE 1b 10/03/2016    Allergies: No Known Allergies  Vitals:    Past Medical History: Past Medical History:  Diagnosis Date  . Anxiety   . Calculus, kidney 2000   Sees Dr. Isabel Caprice, urology.   . Cervical spondylosis 2004   sp surgery by Dr. Danielle Dess  . Depression   . Headache(784.0)    Chronic, on vicodin 5 TID for this.   . Hepatitis C    Has occasional visits with Dr. Randa Evens GI, failed RX in 2000.  Liver Biopsy 9/06: Minimally active hepatitis consistent with hepatitis C, minimal necroinflamtory activity grade 1, no incrased fibrosis stage 0.   . Herpes labialis   . Hypertension   . Pneumonia   . Renal stone   . Spondylolisthesis of lumbar region   . Thalassemia    HGB 9/09 14.4 wtih MCV 72.6  . Wears glasses     Social History: Social History   Social History  . Marital status: Married    Spouse name: N/A  . Number of children: N/A  . Years of education: N/A   Social History Main Topics  . Smoking status: Former Games developer  . Smokeless tobacco: Never Used     Comment: quit in the early 1980's  . Alcohol use Yes     Comment: Occasionally  . Drug use: No  . Sexual activity: Yes    Partners: Female   Other Topics Concern  . Not on file   Social History Narrative   Works at post office, Married, Exercises.     Labs: Hep B S Ab (no units)  Date Value  10/03/2016 NEG   Hepatitis B Surface Ag (no units)  Date Value  10/03/2016 NEGATIVE    Lab Results  Component Value Date   HCVGENOTYPE 1b 10/03/2016    Hepatitis C RNA quantitative Latest Ref Rng & Units 12/12/2016 11/05/2016 09/09/2013  HCV Quantitative NOT DETECTED IU/mL <15 NOT DETECTED <15 DETECTED(A) 4,782,956(O)   HCV Quantitative Log NOT DETECTED Log IU/mL <1.18 NOT DETECTED <1.18 DETECTED(A) 6.50(H)    AST  Date Value  10/03/2016 31 U/L  09/12/2016 29 IU/L  09/08/2016 28 U/L   ALT  Date Value  10/03/2016 38 U/L  10/03/2016 35 U/L  09/12/2016 33 IU/L  09/08/2016 32 U/L   INR (no units)  Date Value  10/03/2016 1.0  11/23/2013 0.98    CrCl: CrCl cannot be calculated (Patient's most recent lab result is older than the maximum 21 days allowed.).  Fibrosis Score: F3/F4 as assessed by ARFI   Child-Pugh Score: A  Previous Treatment Regimen: PEG/ribavirin x 48wks  Assessment: Claxton is here today for his EOT visit for HepC infection. He finished 12 weeks of Harvoni last Wednesday, September 26. Muhannad had been having issues with stomach pains and NV while on Harvoni, for which he was prescribed Phenergan. He reports that the medication is helpful and takes it approximately 1-2 times daily. He requested a refill.   His VL before treatment was around 3.4 million and has remained undetectable since his first follow-up visit in July. We will recheck his VL today and bring him back for his cure visit in 6 months 2/2 previous treatment  experience and F3/F4 status.  Azarias's Scr was slightly elevated at 1.45 (BL ~1.1-1.3) at his last visit in August, though not as high as it has been in the past (1.64 in May). This may have been 2/2 accidental doubling of doses. We will recheck today.  Emric also requested a flu shot, which was given. He was also offered the hepatitis B series, since he tested non-immune in June. He received the first shot today and will return to clinic to complete the series.  Recommendations: - HepC viral load, BMET today - Continue Phenergan 12.5mg  q6h prn NV - F/u appt on 11/7 @ 0900 for second HepB shot - F/u appt on 3/26 @ 0900 for cure visit and third HepB shot  Lettie Czarnecki N. Zigmund Druck, PharmD PGY1 Pharmacy Resident Pager: [phone]  01/16/2017, 9:29 AM

## 2017-01-17 ENCOUNTER — Ambulatory Visit: Payer: Federal, State, Local not specified - PPO | Attending: Neurological Surgery

## 2017-01-17 DIAGNOSIS — M62830 Muscle spasm of back: Secondary | ICD-10-CM | POA: Diagnosis not present

## 2017-01-17 DIAGNOSIS — R262 Difficulty in walking, not elsewhere classified: Secondary | ICD-10-CM | POA: Diagnosis not present

## 2017-01-17 DIAGNOSIS — G8929 Other chronic pain: Secondary | ICD-10-CM | POA: Diagnosis not present

## 2017-01-17 DIAGNOSIS — M545 Low back pain: Secondary | ICD-10-CM | POA: Diagnosis not present

## 2017-01-17 DIAGNOSIS — R293 Abnormal posture: Secondary | ICD-10-CM | POA: Insufficient documentation

## 2017-01-17 DIAGNOSIS — M6281 Muscle weakness (generalized): Secondary | ICD-10-CM | POA: Diagnosis not present

## 2017-01-17 NOTE — Therapy (Signed)
[facility] [address] [place], [place], [address] Phone: [phone]   Fax:  [phone]  Physical Therapy Evaluation  Patient Details  Name: Greg Flowers MRN: [id number] Date of Birth: Jun 20, 1955 Referring Provider: Barnett Abu, MD  Encounter Date: 01/17/2017      PT End of Session - 01/17/17 0951    Visit Number 1   Number of Visits 12   Date for PT Re-Evaluation 03/01/17   PT Start Time 0930   PT Stop Time 1015   PT Time Calculation (min) 45 min   Activity Tolerance Patient tolerated treatment well   Behavior During Therapy [facility] for tasks assessed/performed      Past Medical History:  Diagnosis Date  . Anxiety   . Calculus, kidney 2000   Sees Dr. Isabel Caprice, urology.   . Cervical spondylosis 2004   sp surgery by Dr. Danielle Dess  . Depression   . Headache(784.0)    Chronic, on vicodin 5 TID for this.   . Hepatitis C    Has occasional visits with Dr. Randa Evens GI, failed RX in 2000.  Liver Biopsy 9/06: Minimally active hepatitis consistent with hepatitis C, minimal necroinflamtory activity grade 1, no incrased fibrosis stage 0.   . Herpes labialis   . Hypertension   . Pneumonia   . Renal stone   . Spondylolisthesis of lumbar region   . Thalassemia    HGB 9/09 14.4 wtih MCV 72.6  . Wears glasses     Past Surgical History:  Procedure Laterality Date  . CERVICAL DISCECTOMY  2004   Dr Danielle Dess  . COLONOSCOPY    . LITHOTRIPSY  2004   Dr Logan Bores  . TONSILLECTOMY      There were no vitals filed for this visit.       Subjective Assessment - 01/17/17 0939    Subjective He reports in May he began to have RT flank and abdomen after doing yard work. .  Testing negative. Now Lt lower back.   MD without indication of problem. MRI negative surgery stable. OA in lower back.     Pertinent History L4-5 fusion   Limitations Walking;Lifting  stairs, yard work,    How long can you sit comfortably? 2 hours    How long can you  stand comfortably? 45-60 min    How long can you walk comfortably? 75 yards   Diagnostic tests MRI   Patient Stated Goals Decreased pain.    Currently in Pain? Yes   Pain Score 5    Pain Location Back   Pain Orientation Right;Left;Lower   Pain Descriptors / Indicators Aching;Tightness   Pain Type Chronic pain   Pain Onset More than a month ago   Pain Frequency Constant   Aggravating Factors  see above   Pain Relieving Factors Heat , mes , lidocain patches, linament, massage   Multiple Pain Sites No            [facility] PT Assessment - 01/17/17 0001      Assessment   Medical Diagnosis spondilolysthesis   Referring Provider Barnett Abu, MD   Onset Date/Surgical Date 05/16/15  May 2018 incr painonset   Next MD Visit as needed   Prior Therapy 05/2016 post surgery     Precautions   Precautions None     Restrictions   Weight Bearing Restrictions No     Balance Screen   Has the patient fallen in the past 6 months No   Has the patient had  a decrease in activity level because of a fear of falling?  No   Is the patient reluctant to leave their home because of a fear of falling?  No     Prior Function   Level of Independence Independent     Cognition   Overall Cognitive Status Within Functional Limits for tasks assessed     Observation/Other Assessments   Focus on Therapeutic Outcomes (FOTO)  53% limited     Posture/Postural Control   Posture Comments Lt shoulder elevated Lt lateral shift of trunk, Rt pelvis elevated      ROM / Strength   AROM / PROM / Strength AROM;Strength;PROM     AROM   AROM Assessment Site Lumbar   Lumbar Flexion 70   Lumbar Extension 10   Lumbar - Right Side Bend 12   Lumbar - Left Side Bend 20     PROM   Overall PROM Comments bilateral hip tightness with ER 60 degrees IR 30 degrees and adduction 15 degrees     Strength   Overall Strength Comments Normal LE strength, abdominals good     Flexibility   Soft Tissue Assessment /Muscle Length  yes   Hamstrings 80 degrees bilatera            Objective measurements completed on examination: See above findings.                  PT Education - 01/17/17 1016    Education provided Yes   Education Details POC , HEP, body mechanics   Person(s) Educated Patient   Methods Explanation;Tactile cues;Verbal cues;Handout   Comprehension Returned demonstration;Verbalized understanding          PT Short Term Goals - 01/17/17 1113      PT SHORT TERM GOAL #1   Title pt will be independent with initial HEP   Time 2   Period Weeks   Status New     PT SHORT TERM GOAL #2   Title He will report pain decreased 20-30% with normal activity   Time 3   Period Weeks   Status New     PT SHORT TERM GOAL #3   Title He will demo understanding of good mechanics   Time 3   Period Weeks   Status New           PT Long Term Goals - 01/17/17 1115      PT LONG TERM GOAL #1   Title FOTO will decreased to < 40% limited   Time 6   Period Weeks   Status New     PT LONG TERM GOAL #2   Title He will tolerated gym equipment exercise without incr back pain to tolerate joining gym   Time 6   Period Weeks   Status New     PT LONG TERM GOAL #3   Title He will report pain decreased 50% or more with normal activity   Time 6   Period Weeks   Status New     PT LONG TERM GOAL #4   Title He will be independent with all HEP issued   Time 6   Period Weeks   Status New                Plan - 01/17/17 1016    Clinical Impression Statement Mr Greg Flowers reports onset of incr pain 08/2016. He was doing well last Pt episode and looks and moves well now. His posture is not normal and is  stiff in back and hips. otherwise strength is good to normal.    Clinical Presentation Stable   Clinical Decision Making Low   Rehab Potential Good   PT Frequency 2x / week   PT Duration 6 weeks   PT Treatment/Interventions Moist Heat;Dry needling;Manual techniques;Therapeutic  exercise;Therapeutic activities;Patient/family education;Passive range of motion   PT Next Visit Plan Review stretching and maual for ROm and add to stretching at home   PT Home Exercise Plan side bend and rotation stretching to LT   Consulted and Agree with Plan of Care Patient      Patient will benefit from skilled therapeutic intervention in order to improve the following deficits and impairments:  Pain, Decreased range of motion, Postural dysfunction, Impaired flexibility, Increased muscle spasms  Visit Diagnosis: Abnormal posture  Muscle spasm of back  Chronic bilateral low back pain without sciatica     Problem List Patient Active Problem List   Diagnosis Date Noted  . Back pain of thoracolumbar region 10/18/2016  . AKI (acute kidney injury) (HCC) 09/13/2016  . Thoracic radiculopathy 09/12/2016  . Spondylolisthesis at L4-L5 level 02/13/2016  . NSAID long-term use 01/09/2016  . Lumbar radiculopathy, acute 01/09/2016  . Preventative health care 02/12/2012  . Osteoarthritis of both knees 11/02/2011  . NECK AND BACK PAIN 11/03/2009  . FOOT PAIN, BILATERAL 09/20/2009  . HEADACHE 05/06/2009  . DISORDER, DEPRESSIVE NEC 09/25/2006  . HERPES LABIALIS 04/04/2006  . THALASSEMIA NEC 04/04/2006  . Chronic hepatitis C without hepatic coma (HCC) 02/22/2006  . Essential hypertension 02/22/2006  . CALCULUS, KIDNEY 02/22/2006  . SPONDYLOSIS, CERVICAL 02/22/2006  . COLONOSCOPY, HX OF 02/22/2006    Caprice Red  PT 01/17/2017, 11:21 AM  [facility] [address] [place], [place], [address] Phone: [phone]   Fax:  [phone]  Name: JAHSEH LUCCHESE MRN: [id number] Date of Birth: [birth year]

## 2017-01-17 NOTE — Patient Instructions (Signed)
LTR to LT 30 sec -60 2-3 reps   3x/day and also the same with sidelye quadratus stretch

## 2017-01-19 LAB — HEPATITIS C RNA QUANTITATIVE
HCV Quantitative Log: <1.18 Log IU/mL
HCV RNA, PCR, QN: <15 IU/mL

## 2017-01-21 ENCOUNTER — Ambulatory Visit: Payer: Federal, State, Local not specified - PPO

## 2017-01-22 ENCOUNTER — Ambulatory Visit: Payer: Federal, State, Local not specified - PPO

## 2017-01-22 DIAGNOSIS — R262 Difficulty in walking, not elsewhere classified: Secondary | ICD-10-CM | POA: Diagnosis not present

## 2017-01-22 DIAGNOSIS — M6281 Muscle weakness (generalized): Secondary | ICD-10-CM | POA: Diagnosis not present

## 2017-01-22 DIAGNOSIS — G8929 Other chronic pain: Secondary | ICD-10-CM

## 2017-01-22 DIAGNOSIS — R293 Abnormal posture: Secondary | ICD-10-CM

## 2017-01-22 DIAGNOSIS — M62830 Muscle spasm of back: Secondary | ICD-10-CM | POA: Diagnosis not present

## 2017-01-22 DIAGNOSIS — M545 Low back pain, unspecified: Secondary | ICD-10-CM

## 2017-01-22 NOTE — Therapy (Signed)
[facility] [address] [place], [place], [address] Phone: [phone]   Fax:  [phone]  Physical Therapy Treatment  Patient Details  Name: Greg Flowers MRN: [id number] Date of Birth: 1956/03/25 Referring Provider: Barnett Abu, MD  Encounter Date: 01/22/2017      PT End of Session - 01/22/17 1021    Visit Number 2   Number of Visits 12   Date for PT Re-Evaluation 03/01/17   PT Start Time 1015   PT Stop Time 1100   PT Time Calculation (min) 45 min   Activity Tolerance Patient tolerated treatment well   Behavior During Therapy [facility] for tasks assessed/performed      Past Medical History:  Diagnosis Date  . Anxiety   . Calculus, kidney 2000   Sees Dr. Isabel Caprice, urology.   . Cervical spondylosis 2004   sp surgery by Dr. Danielle Dess  . Depression   . Headache(784.0)    Chronic, on vicodin 5 TID for this.   . Hepatitis C    Has occasional visits with Dr. Randa Evens GI, failed RX in 2000.  Liver Biopsy 9/06: Minimally active hepatitis consistent with hepatitis C, minimal necroinflamtory activity grade 1, no incrased fibrosis stage 0.   . Herpes labialis   . Hypertension   . Pneumonia   . Renal stone   . Spondylolisthesis of lumbar region   . Thalassemia    HGB 9/09 14.4 wtih MCV 72.6  . Wears glasses     Past Surgical History:  Procedure Laterality Date  . CERVICAL DISCECTOMY  2004   Dr Danielle Dess  . COLONOSCOPY    . LITHOTRIPSY  2004   Dr Logan Bores  . TONSILLECTOMY      There were no vitals filed for this visit.      Subjective Assessment - 01/22/17 1021    Subjective no changes but mild to mod medial thigh pain LT  2/10 back   Currently in Pain? Yes   Pain Score 2    Pain Location Back   Pain Orientation Right;Left;Lower   Pain Descriptors / Indicators Aching;Tightness   Pain Type Chronic pain   Pain Onset More than a month ago   Pain Frequency Constant                         [facility] Adult PT  Treatment/Exercise - 01/22/17 0001      Lumbar Exercises: Stretches   Lower Trunk Rotation 60 seconds;1 rep   Lower Trunk Rotation Limitations to LT, RT knee over left     Lumbar Exercises: Supine   Other Supine Lumbar Exercises also issued and performed hip abduction and ER stretching for hips.     Lumbar Exercises: Sidelying   Other Sidelying Lumbar Exercises quadrus stretch RT. 30 sec with manual stretching     Manual Therapy   Manual Therapy Joint mobilization;Soft tissue mobilization;Manual Traction;Passive ROM   Joint Mobilization AP mobs RT and Lt hip   Soft tissue mobilization RT quadratus and paraspinals   Passive ROM Hip ROM rotation and adduction    Manual Traction RT leg pulls 15 reps x 5                PT Education - 01/22/17 1101    Education provided Yes   Education Details hip stretching   Person(s) Educated Patient   Methods Explanation;Demonstration;Tactile cues;Verbal cues;Handout   Comprehension Returned demonstration;Verbalized understanding          PT  Short Term Goals - 01/17/17 1113      PT SHORT TERM GOAL #1   Title pt will be independent with initial HEP   Time 2   Period Weeks   Status New     PT SHORT TERM GOAL #2   Title He will report pain decreased 20-30% with normal activity   Time 3   Period Weeks   Status New     PT SHORT TERM GOAL #3   Title He will demo understanding of good mechanics   Time 3   Period Weeks   Status New           PT Long Term Goals - 01/17/17 1115      PT LONG TERM GOAL #1   Title FOTO will decreased to < 40% limited   Time 6   Period Weeks   Status New     PT LONG TERM GOAL #2   Title He will tolerated gym equipment exercise without incr back pain to tolerate joining gym   Time 6   Period Weeks   Status New     PT LONG TERM GOAL #3   Title He will report pain decreased 50% or more with normal activity   Time 6   Period Weeks   Status New     PT LONG TERM GOAL #4   Title He will  be independent with all HEP issued   Time 6   Period Weeks   Status New               Plan - 01/22/17 1025    Clinical Impression Statement Tolerated session  without incr pain.    back pain mild Lt hip adductor pain    PT Treatment/Interventions Moist Heat;Dry needling;Manual techniques;Therapeutic exercise;Therapeutic activities;Patient/family education;Passive range of motion   PT Next Visit Plan Review stretching and maual for ROM and add to stretching at home   PT Home Exercise Plan side bend and rotation stretching to LT, hip abduction and ER supine and standing   Consulted and Agree with Plan of Care Patient      Patient will benefit from skilled therapeutic intervention in order to improve the following deficits and impairments:  Pain, Decreased range of motion, Postural dysfunction, Impaired flexibility, Increased muscle spasms  Visit Diagnosis: Abnormal posture  Muscle spasm of back  Chronic bilateral low back pain without sciatica  Acute bilateral low back pain without sciatica  Muscle weakness (generalized)  Difficulty in walking, not elsewhere classified     Problem List Patient Active Problem List   Diagnosis Date Noted  . Back pain of thoracolumbar region 10/18/2016  . AKI (acute kidney injury) (HCC) 09/13/2016  . Thoracic radiculopathy 09/12/2016  . Spondylolisthesis at L4-L5 level 02/13/2016  . NSAID long-term use 01/09/2016  . Lumbar radiculopathy, acute 01/09/2016  . Preventative health care 02/12/2012  . Osteoarthritis of both knees 11/02/2011  . NECK AND BACK PAIN 11/03/2009  . FOOT PAIN, BILATERAL 09/20/2009  . HEADACHE 05/06/2009  . DISORDER, DEPRESSIVE NEC 09/25/2006  . HERPES LABIALIS 04/04/2006  . THALASSEMIA NEC 04/04/2006  . Chronic hepatitis C without hepatic coma (HCC) 02/22/2006  . Essential hypertension 02/22/2006  . CALCULUS, KIDNEY 02/22/2006  . SPONDYLOSIS, CERVICAL 02/22/2006  . COLONOSCOPY, HX OF 02/22/2006     Caprice Red  PT 01/22/2017, 11:03 AM  [facility] [address] [place], [place], [address] Phone: [phone]   Fax:  [phone]  Name: Greg Caraway  Flowers MRN: [id number] Date of Birth: [birth year]

## 2017-01-22 NOTE — Patient Instructions (Signed)
From cabinet misc hip stretching for abduction and ER 30-60 sec 2-3 reps 2 x/day

## 2017-01-23 ENCOUNTER — Telehealth: Payer: Self-pay | Admitting: *Deleted

## 2017-01-23 MED ORDER — MELOXICAM 15 MG PO TABS: 15.0000 mg | ORAL_TABLET | Freq: Every day | ORAL | 0 refills | Status: DC

## 2017-01-23 NOTE — Telephone Encounter (Signed)
Refill request Meloxicam. Dr. Marylene Land states refill once, pt needs an appt prior to future refills.

## 2017-01-24 ENCOUNTER — Ambulatory Visit: Payer: Federal, State, Local not specified - PPO

## 2017-01-24 DIAGNOSIS — M545 Low back pain, unspecified: Secondary | ICD-10-CM

## 2017-01-24 DIAGNOSIS — G8929 Other chronic pain: Secondary | ICD-10-CM | POA: Diagnosis not present

## 2017-01-24 DIAGNOSIS — R293 Abnormal posture: Secondary | ICD-10-CM | POA: Diagnosis not present

## 2017-01-24 DIAGNOSIS — M62830 Muscle spasm of back: Secondary | ICD-10-CM

## 2017-01-24 DIAGNOSIS — M6281 Muscle weakness (generalized): Secondary | ICD-10-CM | POA: Diagnosis not present

## 2017-01-24 DIAGNOSIS — R262 Difficulty in walking, not elsewhere classified: Secondary | ICD-10-CM | POA: Diagnosis not present

## 2017-01-24 NOTE — Patient Instructions (Signed)
From cabinet issued PPT , side clam , shoulder bridge, scissors  1-2x/day 1015 reps  Slow control of tilt

## 2017-01-24 NOTE — Therapy (Signed)
[facility] [address] [place], [place], [address] Phone: [phone]   Fax:  [phone]  Physical Therapy Treatment  Patient Details  Name: Greg Flowers MRN: [id number] Date of Birth: 27-Aug-1955 Referring Provider: Barnett Abu, MD  Encounter Date: 01/24/2017      PT End of Session - 01/24/17 0837    Visit Number 3   Number of Visits 12   Date for PT Re-Evaluation 03/01/17   PT Start Time 0835   PT Stop Time 0932   PT Time Calculation (min) 57 min   Activity Tolerance Patient tolerated treatment well   Behavior During Therapy [facility] for tasks assessed/performed      Past Medical History:  Diagnosis Date  . Anxiety   . Calculus, kidney 2000   Sees Dr. Isabel Caprice, urology.   . Cervical spondylosis 2004   sp surgery by Dr. Danielle Dess  . Depression   . Headache(784.0)    Chronic, on vicodin 5 TID for this.   . Hepatitis C    Has occasional visits with Dr. Randa Evens GI, failed RX in 2000.  Liver Biopsy 9/06: Minimally active hepatitis consistent with hepatitis C, minimal necroinflamtory activity grade 1, no incrased fibrosis stage 0.   . Herpes labialis   . Hypertension   . Pneumonia   . Renal stone   . Spondylolisthesis of lumbar region   . Thalassemia    HGB 9/09 14.4 wtih MCV 72.6  . Wears glasses     Past Surgical History:  Procedure Laterality Date  . CERVICAL DISCECTOMY  2004   Dr Danielle Dess  . COLONOSCOPY    . LITHOTRIPSY  2004   Dr Logan Bores  . TONSILLECTOMY      There were no vitals filed for this visit.      Subjective Assessment - 01/24/17 0841    Subjective Doing pretty well . 2/10 pain.    Currently in Pain? Yes   Pain Score 2    Pain Location Back   Pain Orientation Right;Left;Lower   Pain Descriptors / Indicators Aching   Pain Type Chronic pain   Pain Onset More than a month ago   Pain Frequency Constant                         [facility] Adult PT Treatment/Exercise - 01/24/17 0001       Lumbar Exercises: Stretches   Lower Trunk Rotation 60 seconds;1 rep   Lower Trunk Rotation Limitations to LT, RT knee over left   Pelvic Tilt Limitations 10 reps 5 sec much cuing to engage abdominals     Lumbar Exercises: Supine   Other Supine Lumbar Exercises (P)  hip rotation and abduction stretch   Other Supine Lumbar Exercises (P)  PPT, shoulder bridge,  bent knee raise all x 2-15 reps cueing for stability in pelvis and back     Knee/Hip Exercises: Aerobic   Elliptical L5 UE and LE  6 min     Modalities   Modalities Moist Heat     Moist Heat Therapy   Number Minutes Moist Heat 12 Minutes   Moist Heat Location --  RT thigh medial     Manual Therapy   Joint Mobilization AP mobs RT and Lt hip 100 reps    Passive ROM Hip ROM rotation and adduction 45-60 sec   Manual Traction RT leg pulls Gr 4 100 reps RT and Lt  PT Education - 01/24/17 0925    Education provided Yes   Education Details scissors, shoulder bridge, clam, PPT   Person(s) Educated Patient   Methods Explanation;Demonstration;Tactile cues;Verbal cues;Handout   Comprehension Returned demonstration;Verbalized understanding          PT Short Term Goals - 01/24/17 0941      PT SHORT TERM GOAL #1   Title pt will be independent with initial HEP   Status On-going     PT SHORT TERM GOAL #2   Title He will report pain decreased 20-30% with normal activity   Status On-going     PT SHORT TERM GOAL #3   Title He will demo understanding of good mechanics   Status On-going           PT Long Term Goals - 01/17/17 1115      PT LONG TERM GOAL #1   Title FOTO will decreased to < 40% limited   Time 6   Period Weeks   Status New     PT LONG TERM GOAL #2   Title He will tolerated gym equipment exercise without incr back pain to tolerate joining gym   Time 6   Period Weeks   Status New     PT LONG TERM GOAL #3   Title He will report pain decreased 50% or more with normal  activity   Time 6   Period Weeks   Status New     PT LONG TERM GOAL #4   Title He will be independent with all HEP issued   Time 6   Period Weeks   Status New               Plan - 01/24/17 0840    Clinical Impression Statement Doing well no pain with exercies. No back pain post but still with medial LT thigh pain 2/10 so added some heat for this after stretch   PT Treatment/Interventions Moist Heat;Dry needling;Manual techniques;Therapeutic exercise;Therapeutic activities;Patient/family education;Passive range of motion   PT Next Visit Plan manual , body mechanics, review HEP   PT Home Exercise Plan side bend and rotation stretching to LT, hip abduction and ER supine and standing, PPT ,shoulder bridge, clam side lye,    Consulted and Agree with Plan of Care Patient      Patient will benefit from skilled therapeutic intervention in order to improve the following deficits and impairments:  Pain, Decreased range of motion, Postural dysfunction, Impaired flexibility, Increased muscle spasms  Visit Diagnosis: Abnormal posture  Muscle spasm of back  Chronic bilateral low back pain without sciatica     Problem List Patient Active Problem List   Diagnosis Date Noted  . Back pain of thoracolumbar region 10/18/2016  . AKI (acute kidney injury) (HCC) 09/13/2016  . Thoracic radiculopathy 09/12/2016  . Spondylolisthesis at L4-L5 level 02/13/2016  . NSAID long-term use 01/09/2016  . Lumbar radiculopathy, acute 01/09/2016  . Preventative health care 02/12/2012  . Osteoarthritis of both knees 11/02/2011  . NECK AND BACK PAIN 11/03/2009  . FOOT PAIN, BILATERAL 09/20/2009  . HEADACHE 05/06/2009  . DISORDER, DEPRESSIVE NEC 09/25/2006  . HERPES LABIALIS 04/04/2006  . THALASSEMIA NEC 04/04/2006  . Chronic hepatitis C without hepatic coma (HCC) 02/22/2006  . Essential hypertension 02/22/2006  . CALCULUS, KIDNEY 02/22/2006  . SPONDYLOSIS, CERVICAL 02/22/2006  . COLONOSCOPY, HX  OF 02/22/2006    Caprice Red  PT 01/24/2017, 9:43 AM  [facility] [address]  [place], [place], [address] Phone: [phone]   Fax:  [phone]  Name: Greg Flowers MRN: [id number] Date of Birth: 04-Nov-1955

## 2017-01-29 ENCOUNTER — Ambulatory Visit: Payer: Federal, State, Local not specified - PPO | Admitting: Physical Therapy

## 2017-01-29 DIAGNOSIS — M545 Low back pain: Secondary | ICD-10-CM | POA: Diagnosis not present

## 2017-01-29 DIAGNOSIS — M62830 Muscle spasm of back: Secondary | ICD-10-CM | POA: Diagnosis not present

## 2017-01-29 DIAGNOSIS — G8929 Other chronic pain: Secondary | ICD-10-CM

## 2017-01-29 DIAGNOSIS — R262 Difficulty in walking, not elsewhere classified: Secondary | ICD-10-CM | POA: Diagnosis not present

## 2017-01-29 DIAGNOSIS — M6281 Muscle weakness (generalized): Secondary | ICD-10-CM | POA: Diagnosis not present

## 2017-01-29 DIAGNOSIS — R293 Abnormal posture: Secondary | ICD-10-CM

## 2017-01-29 NOTE — Therapy (Signed)
[facility] Outpatient Rehabilitation [facility] [address] [place], [place], [address] Phone: [phone]   Fax:  [phone]  Physical Therapy Treatment  Patient Details  Name: Greg Flowers MRN: [id number] Date of Birth: 1955/09/23 Referring Provider: Barnett Abu, MD  Encounter Date: 01/29/2017      PT End of Session - 01/29/17 1027    Visit Number 4   Number of Visits 12   Date for PT Re-Evaluation 03/01/17   PT Start Time 1015   PT Stop Time 1115   PT Time Calculation (min) 60 min      Past Medical History:  Diagnosis Date  . Anxiety   . Calculus, kidney 2000   Sees Dr. Isabel Caprice, urology.   . Cervical spondylosis 2004   sp surgery by Dr. Danielle Dess  . Depression   . Headache(784.0)    Chronic, on vicodin 5 TID for this.   . Hepatitis C    Has occasional visits with Dr. Randa Evens GI, failed RX in 2000.  Liver Biopsy 9/06: Minimally active hepatitis consistent with hepatitis C, minimal necroinflamtory activity grade 1, no incrased fibrosis stage 0.   . Herpes labialis   . Hypertension   . Pneumonia   . Renal stone   . Spondylolisthesis of lumbar region   . Thalassemia    HGB 9/09 14.4 wtih MCV 72.6  . Wears glasses     Past Surgical History:  Procedure Laterality Date  . CERVICAL DISCECTOMY  2004   Dr Danielle Dess  . COLONOSCOPY    . LITHOTRIPSY  2004   Dr Logan Bores  . TONSILLECTOMY      There were no vitals filed for this visit.      Subjective Assessment - 01/29/17 1026    Subjective Sill some discomfort.   Currently in Pain? Yes   Pain Score 4    Pain Location Back   Pain Descriptors / Indicators Aching;Discomfort   Aggravating Factors  walking, lifting, getting up after prolonged sitting   Pain Relieving Factors heat, meds                         [facility] Adult PT Treatment/Exercise - 01/29/17 0001      Lumbar Exercises: Stretches   Lower Trunk Rotation Limitations to LT, RT knee over left     Lumbar Exercises: Supine    Bridge 10 reps   Bridge Limitations 10 reps with 1 clam    Other Supine Lumbar Exercises PPT, shoulder bridge,  bent knee raise all x 2-15 reps cueing for stability in pelvis and back     Knee/Hip Exercises: Stretches   Hip Flexor Stretch Limitations bilateral thomas x 1 minute      Knee/Hip Exercises: Aerobic   Elliptical Ramp 8 Resistance 3, 3 minutes, fatigued   Nustep L5 UE/Le x 4 min     Moist Heat Therapy   Number Minutes Moist Heat 15 Minutes   Moist Heat Location Lumbar Spine  and rt medial thigh      Manual Therapy   Joint Mobilization AP mobs RT and Lt hip 100 reps    Passive ROM bilateral hip ROM ER, IR, abduction, flexion, passive hamstring   Manual Traction RT leg pulls 15 reps x 5                  PT Short Term Goals - 01/24/17 0941      PT SHORT TERM GOAL #1   Title pt will be  independent with initial HEP   Status On-going     PT SHORT TERM GOAL #2   Title He will report pain decreased 20-30% with normal activity   Status On-going     PT SHORT TERM GOAL #3   Title He will demo understanding of good mechanics   Status On-going           PT Long Term Goals - 01/17/17 1115      PT LONG TERM GOAL #1   Title FOTO will decreased to < 40% limited   Time 6   Period Weeks   Status New     PT LONG TERM GOAL #2   Title He will tolerated gym equipment exercise without incr back pain to tolerate joining gym   Time 6   Period Weeks   Status New     PT LONG TERM GOAL #3   Title He will report pain decreased 50% or more with normal activity   Time 6   Period Weeks   Status New     PT LONG TERM GOAL #4   Title He will be independent with all HEP issued   Time 6   Period Weeks   Status New               Plan - 01/29/17 1027    Clinical Impression Statement Pt attended walking group with wife last Tuesday and will do it again tonight. He reports significant reduction in overall pain. Still has dome dsscomfort. Continued manual for  hip ROM. Reviewed exercise/ core.  Progresing toward STGs.    PT Next Visit Plan manual , body mechanics, review HEP   PT Home Exercise Plan side bend and rotation stretching to LT, hip abduction and ER supine and standing, PPT ,shoulder bridge, clam side lye,    Consulted and Agree with Plan of Care Patient      Patient will benefit from skilled therapeutic intervention in order to improve the following deficits and impairments:  Pain, Decreased range of motion, Postural dysfunction, Impaired flexibility, Increased muscle spasms  Visit Diagnosis: Abnormal posture  Muscle spasm of back  Chronic bilateral low back pain without sciatica     Problem List Patient Active Problem List   Diagnosis Date Noted  . Back pain of thoracolumbar region 10/18/2016  . AKI (acute kidney injury) (HCC) 09/13/2016  . Thoracic radiculopathy 09/12/2016  . Spondylolisthesis at L4-L5 level 02/13/2016  . NSAID long-term use 01/09/2016  . Lumbar radiculopathy, acute 01/09/2016  . Preventative health care 02/12/2012  . Osteoarthritis of both knees 11/02/2011  . NECK AND BACK PAIN 11/03/2009  . FOOT PAIN, BILATERAL 09/20/2009  . HEADACHE 05/06/2009  . DISORDER, DEPRESSIVE NEC 09/25/2006  . HERPES LABIALIS 04/04/2006  . THALASSEMIA NEC 04/04/2006  . Chronic hepatitis C without hepatic coma (HCC) 02/22/2006  . Essential hypertension 02/22/2006  . CALCULUS, KIDNEY 02/22/2006  . SPONDYLOSIS, CERVICAL 02/22/2006  . COLONOSCOPY, HX OF 02/22/2006    Sherrie Mustache, PTA 01/29/2017, 11:14 AM  [facility] [address] [place], [place], [address] Phone: [phone]   Fax:  [phone]  Name: Greg Flowers MRN: [id number] Date of Birth: [birth year]

## 2017-01-31 ENCOUNTER — Ambulatory Visit: Payer: Federal, State, Local not specified - PPO

## 2017-01-31 DIAGNOSIS — R293 Abnormal posture: Secondary | ICD-10-CM

## 2017-01-31 DIAGNOSIS — G8929 Other chronic pain: Secondary | ICD-10-CM | POA: Diagnosis not present

## 2017-01-31 DIAGNOSIS — M545 Low back pain, unspecified: Secondary | ICD-10-CM

## 2017-01-31 DIAGNOSIS — M62830 Muscle spasm of back: Secondary | ICD-10-CM | POA: Diagnosis not present

## 2017-01-31 DIAGNOSIS — M6281 Muscle weakness (generalized): Secondary | ICD-10-CM | POA: Diagnosis not present

## 2017-01-31 DIAGNOSIS — R262 Difficulty in walking, not elsewhere classified: Secondary | ICD-10-CM

## 2017-01-31 NOTE — Patient Instructions (Signed)
Issued from cabinet prayer stretch 30-60 sec  2-3 reps 2-3x/day

## 2017-01-31 NOTE — Therapy (Signed)
[facility] Outpatient Rehabilitation [facility] [address] [place], [place], [address] Phone: [phone]   Fax:  [phone]  Physical Therapy Treatment  Patient Details  Name: Greg Flowers MRN: [id number] Date of Birth: 10-29-1955 Referring Provider: Barnett Abu, MD  Encounter Date: 01/31/2017      PT End of Session - 01/31/17 0926    Visit Number 5   Number of Visits 12   Date for PT Re-Evaluation 03/01/17   PT Start Time 0925   PT Stop Time 1015   PT Time Calculation (min) 50 min   Activity Tolerance Patient tolerated treatment well   Behavior During Therapy [facility] for tasks assessed/performed      Past Medical History:  Diagnosis Date  . Anxiety   . Calculus, kidney 2000   Sees Dr. Isabel Caprice, urology.   . Cervical spondylosis 2004   sp surgery by Dr. Danielle Dess  . Depression   . Headache(784.0)    Chronic, on vicodin 5 TID for this.   . Hepatitis C    Has occasional visits with Dr. Randa Evens GI, failed RX in 2000.  Liver Biopsy 9/06: Minimally active hepatitis consistent with hepatitis C, minimal necroinflamtory activity grade 1, no incrased fibrosis stage 0.   . Herpes labialis   . Hypertension   . Pneumonia   . Renal stone   . Spondylolisthesis of lumbar region   . Thalassemia    HGB 9/09 14.4 wtih MCV 72.6  . Wears glasses     Past Surgical History:  Procedure Laterality Date  . CERVICAL DISCECTOMY  2004   Dr Danielle Dess  . COLONOSCOPY    . LITHOTRIPSY  2004   Dr Logan Bores  . TONSILLECTOMY      There were no vitals filed for this visit.      Subjective Assessment - 01/31/17 0939    Subjective Tuesday walked and stpping exercise for 60 min and legs now sore.    Currently in Pain? Yes   Pain Score 5    Pain Location Leg   Pain Orientation Right;Left   Pain Descriptors / Indicators Aching;Sore   Pain Type Chronic pain   Pain Onset More than a month ago   Pain Frequency Constant   Aggravating Factors  wal;k lift   Pain Relieving Factors  heat   Multiple Pain Sites No                         [facility] Adult PT Treatment/Exercise - 01/31/17 0001      Lumbar Exercises: Stretches   Single Knee to Chest Stretch 2 reps;30 seconds   Single Knee to Chest Stretch Limitations RT/LT   Lower Trunk Rotation 60 seconds   Lower Trunk Rotation Limitations to LT, RT knee over left   Pelvic Tilt Limitations 10 reps 5 sec much cuing to engage abdominals     Lumbar Exercises: Supine   Other Supine Lumbar Exercises PPT, shoulder bridge,  bent knee raise all x 2-15 reps cueing for stability in pelvis and back     Lumbar Exercises: Sidelying   Clam 15 reps   Clam Limitations RT/LT     Lumbar Exercises: Quadruped   Madcat/Old Horse 10 reps   Madcat/Old Horse Limitations preceded by prayer stretching 2 x 45 sec     Knee/Hip Exercises: Stretches   Risk manager Limitations prone   Hip Flexor Stretch Limitations bilateral thomas x 1 minute  Knee/Hip Exercises: Aerobic   Nustep L5 UE/Le x 8 min     Manual Therapy   Passive ROM prone knee flexion                PT Education - 01/31/17 1013    Education provided Yes   Education Details prayer stretch   Person(s) Educated Patient   Methods Explanation;Demonstration;Tactile cues;Verbal cues;Handout   Comprehension Returned demonstration;Verbalized understanding          PT Short Term Goals - 01/24/17 0941      PT SHORT TERM GOAL #1   Title pt will be independent with initial HEP   Status On-going     PT SHORT TERM GOAL #2   Title He will report pain decreased 20-30% with normal activity   Status On-going     PT SHORT TERM GOAL #3   Title He will demo understanding of good mechanics   Status On-going           PT Long Term Goals - 01/17/17 1115      PT LONG TERM GOAL #1   Title FOTO will decreased to < 40% limited   Time 6   Period Weeks   Status New     PT LONG TERM GOAL #2   Title He will  tolerated gym equipment exercise without incr back pain to tolerate joining gym   Time 6   Period Weeks   Status New     PT LONG TERM GOAL #3   Title He will report pain decreased 50% or more with normal activity   Time 6   Period Weeks   Status New     PT LONG TERM GOAL #4   Title He will be independent with all HEP issued   Time 6   Period Weeks   Status New               Plan - 01/31/17 0941    Clinical Impression Statement Tolerated 60 min exercises but legs sore.  Muscle soreness for not doing so much exercise at one time.    He reports soreness eased some in legs after session   PT Next Visit Plan manual , body mechanics, review HEP   PT Home Exercise Plan side bend and rotation stretching to LT, hip abduction and ER supine and standing, PPT ,shoulder bridge, clam side lye, prayer stretch   Consulted and Agree with Plan of Care Patient      Patient will benefit from skilled therapeutic intervention in order to improve the following deficits and impairments:     Visit Diagnosis: Abnormal posture  Muscle spasm of back  Chronic bilateral low back pain without sciatica  Acute bilateral low back pain without sciatica  Muscle weakness (generalized)  Difficulty in walking, not elsewhere classified     Problem List Patient Active Problem List   Diagnosis Date Noted  . Back pain of thoracolumbar region 10/18/2016  . AKI (acute kidney injury) (HCC) 09/13/2016  . Thoracic radiculopathy 09/12/2016  . Spondylolisthesis at L4-L5 level 02/13/2016  . NSAID long-term use 01/09/2016  . Lumbar radiculopathy, acute 01/09/2016  . Preventative health care 02/12/2012  . Osteoarthritis of both knees 11/02/2011  . NECK AND BACK PAIN 11/03/2009  . FOOT PAIN, BILATERAL 09/20/2009  . HEADACHE 05/06/2009  . DISORDER, DEPRESSIVE NEC 09/25/2006  . HERPES LABIALIS 04/04/2006  . THALASSEMIA NEC 04/04/2006  . Chronic hepatitis C without hepatic coma (HCC) 02/22/2006  .  Essential hypertension 02/22/2006  .  CALCULUS, KIDNEY 02/22/2006  . SPONDYLOSIS, CERVICAL 02/22/2006  . COLONOSCOPY, HX OF 02/22/2006    Caprice Red  PT 01/31/2017, 10:15 AM  [facility] [address] [place], [place], [address] Phone: [phone]   Fax:  [phone]  Name: KHALEEM BURCHILL MRN: [id number] Date of Birth: 06-Mar-1956

## 2017-02-01 DIAGNOSIS — R140 Abdominal distension (gaseous): Secondary | ICD-10-CM | POA: Diagnosis not present

## 2017-02-01 DIAGNOSIS — R1011 Right upper quadrant pain: Secondary | ICD-10-CM | POA: Diagnosis not present

## 2017-02-01 DIAGNOSIS — R112 Nausea with vomiting, unspecified: Secondary | ICD-10-CM | POA: Diagnosis not present

## 2017-02-01 DIAGNOSIS — B1920 Unspecified viral hepatitis C without hepatic coma: Secondary | ICD-10-CM | POA: Diagnosis not present

## 2017-02-01 DIAGNOSIS — M545 Low back pain: Secondary | ICD-10-CM | POA: Diagnosis not present

## 2017-02-05 ENCOUNTER — Ambulatory Visit: Payer: Federal, State, Local not specified - PPO | Admitting: Physical Therapy

## 2017-02-05 DIAGNOSIS — R262 Difficulty in walking, not elsewhere classified: Secondary | ICD-10-CM | POA: Diagnosis not present

## 2017-02-05 DIAGNOSIS — R293 Abnormal posture: Secondary | ICD-10-CM

## 2017-02-05 DIAGNOSIS — G8929 Other chronic pain: Secondary | ICD-10-CM | POA: Diagnosis not present

## 2017-02-05 DIAGNOSIS — M62830 Muscle spasm of back: Secondary | ICD-10-CM | POA: Diagnosis not present

## 2017-02-05 DIAGNOSIS — M545 Low back pain, unspecified: Secondary | ICD-10-CM

## 2017-02-05 DIAGNOSIS — M6281 Muscle weakness (generalized): Secondary | ICD-10-CM | POA: Diagnosis not present

## 2017-02-05 DIAGNOSIS — R1084 Generalized abdominal pain: Secondary | ICD-10-CM | POA: Diagnosis not present

## 2017-02-05 NOTE — Therapy (Signed)
[place], [place], [address] Phone: [phone]   Fax:  [phone]  Physical Therapy Treatment  Patient Details  Name: Greg Flowers MRN: [id number] Date of Birth: [birth year] Referring Provider: Kristeen Miss, MD  Encounter Date: 02/05/2017      PT End of Session - 02/05/17 0856    Visit Number 6   Number of Visits 12   Date for PT Re-Evaluation 03/01/17   PT Start Time 0845   PT Stop Time 0945   PT Time Calculation (min) 60 min      Past Medical History:  Diagnosis Date  . Anxiety   . Calculus, kidney 2000   Sees Dr. Risa Grill, urology.   . Cervical spondylosis 2004   sp surgery by Dr. Ellene Route  . Depression   . Headache(784.0)    Chronic, on vicodin 5 TID for this.   . Hepatitis C    Has occasional visits with Dr. Oletta Lamas GI, failed RX in 2000.  Liver Biopsy 9/06: Minimally active hepatitis consistent with hepatitis C, minimal necroinflamtory activity grade 1, no incrased fibrosis stage 0.   . Herpes labialis   . Hypertension   . Pneumonia   . Renal stone   . Spondylolisthesis of lumbar region   . Thalassemia    HGB 9/09 14.4 wtih MCV 72.6  . Wears glasses     Past Surgical History:  Procedure Laterality Date  . CERVICAL DISCECTOMY  2004   Dr Ellene Route  . COLONOSCOPY    . LITHOTRIPSY  2004   Dr Amalia Hailey  . TONSILLECTOMY      There were no vitals filed for this visit.      Subjective Assessment - 02/05/17 0849    Subjective No pain but I still have a soreness going down inside of left thigh.    Currently in Pain? No/denies                         [facility] Adult PT Treatment/Exercise - 02/05/17 0001      Therapeutic Activites    Therapeutic Activities ADL's;Lifting   ADL's Returned demo- golfers pick up   Nutritional therapist for Strengthening   Leg Press 55# bilateral    Other Lumbar Machine Exercise Seated  Row 35# x 20 low and mid 25#    Other Lumbar Machine Exercise Lat pull down 25# x 20    Elliptical LE only, Ramp 1 Resistance 1 x 3 minutes HR 100 max      Lumbar Exercises: Supine   Other Supine Lumbar Exercises supine with feet on ball, bridge, LTR, hamstring curls with core bracing.    Other Supine Lumbar Exercises PPT, shoulder bridge,  bent knee raise all x 2-15 reps cueing for stability in pelvis and back     Lumbar Exercises: Quadruped   Madcat/Old Horse Limitations  prayer stretching 2 x 45 sec, laterals added    Straight Leg Raise 5 reps   Opposite Arm/Leg Raise 5 reps   Opposite Arm/Leg Raise Limitations cues for neutral      Knee/Hip Exercises: Stretches   Hip Flexor Stretch Limitations sidelying hip flexor stretch - to mimic gym routine      Moist Heat Therapy   Number Minutes Moist Heat 15 Minutes   Moist Heat Location Lumbar Spine  and thigh  PT Education - 02/05/17 0910    Education provided Yes   Education Details Transport planner) Educated Patient   Methods Explanation;Handout   Comprehension Verbalized understanding          PT Short Term Goals - 02/05/17 0856      PT SHORT TERM GOAL #1   Title pt will be independent with initial HEP   Time 2   Period Weeks   Status Achieved     PT SHORT TERM GOAL #2   Title He will report pain decreased 20-30% with normal activity   Time 3   Period Weeks   Status Achieved     PT SHORT TERM GOAL #3   Title He will demo understanding of good mechanics   Time 3   Period Weeks   Status Achieved           PT Long Term Goals - 01/17/17 1115      PT LONG TERM GOAL #1   Title FOTO will decreased to < 40% limited   Time 6   Period Weeks   Status New     PT LONG TERM GOAL #2   Title He will tolerated gym equipment exercise without incr back pain to tolerate joining gym   Time 6   Period Weeks   Status New     PT LONG TERM GOAL #3   Title He will report pain  decreased 50% or more with normal activity   Time 6   Period Weeks   Status New     PT LONG TERM GOAL #4   Title He will be independent with all HEP issued   Time 6   Period Weeks   Status New               Plan - 02/05/17 0737    Clinical Impression Statement 30% decrease in pain. Demonstrates proper body mechanics with lifting and education provided on body mechanics with ADLS, well received by patient. All STGs Met. Another trial of the elliptical without UE 100 HR bpm max. Began gym machines with good tolerance. Pt shown exercises he can do with physioball and on mat at gym. Afterward pt felt a little discomfort in his back so we ended with HMP.    PT Next Visit Plan manual, HEP,assess tolerance to gym machines and quadruped exercises, increase time on elliptical   PT Home Exercise Plan side bend and rotation stretching to LT, hip abduction and ER supine and standing, PPT ,shoulder bridge, clam side lye, prayer stretch   Consulted and Agree with Plan of Care Patient      Patient will benefit from skilled therapeutic intervention in order to improve the following deficits and impairments:  Pain, Decreased range of motion, Postural dysfunction, Impaired flexibility, Increased muscle spasms  Visit Diagnosis: Abnormal posture  Muscle spasm of back  Chronic bilateral low back pain without sciatica  Acute bilateral low back pain without sciatica  Muscle weakness (generalized)  Difficulty in walking, not elsewhere classified     Problem List Patient Active Problem List   Diagnosis Date Noted  . Back pain of thoracolumbar region 10/18/2016  . AKI (acute kidney injury) ([place]) 09/13/2016  . Thoracic radiculopathy 09/12/2016  . Spondylolisthesis at L4-L5 level 02/13/2016  . NSAID long-term use 01/09/2016  . Lumbar radiculopathy, acute 01/09/2016  . Preventative health care 02/12/2012  . Osteoarthritis of both knees 11/02/2011  . NECK AND BACK PAIN 11/03/2009  . FOOT  PAIN, BILATERAL 09/20/2009  . HEADACHE 05/06/2009  . DISORDER, DEPRESSIVE NEC 09/25/2006  . HERPES LABIALIS 04/04/2006  . THALASSEMIA NEC 04/04/2006  . Chronic hepatitis C without hepatic coma ([place]) 02/22/2006  . Essential hypertension 02/22/2006  . CALCULUS, KIDNEY 02/22/2006  . SPONDYLOSIS, CERVICAL 02/22/2006  . COLONOSCOPY, HX OF 02/22/2006    Dorene Ar, PTA 02/05/2017, 9:44 AM  [place], [place], [address] Phone: [phone]   Fax:  [phone]  Name: Greg Flowers MRN: [id number] Date of Birth: 03/31/1956

## 2017-02-05 NOTE — Patient Instructions (Signed)

## 2017-02-07 ENCOUNTER — Ambulatory Visit: Payer: Federal, State, Local not specified - PPO

## 2017-02-07 DIAGNOSIS — M545 Low back pain, unspecified: Secondary | ICD-10-CM

## 2017-02-07 DIAGNOSIS — M62830 Muscle spasm of back: Secondary | ICD-10-CM

## 2017-02-07 DIAGNOSIS — M6281 Muscle weakness (generalized): Secondary | ICD-10-CM | POA: Diagnosis not present

## 2017-02-07 DIAGNOSIS — R262 Difficulty in walking, not elsewhere classified: Secondary | ICD-10-CM | POA: Diagnosis not present

## 2017-02-07 DIAGNOSIS — R293 Abnormal posture: Secondary | ICD-10-CM | POA: Diagnosis not present

## 2017-02-07 DIAGNOSIS — G8929 Other chronic pain: Secondary | ICD-10-CM

## 2017-02-07 NOTE — Therapy (Signed)
[facility] Outpatient Rehabilitation [facility] [address] [place], [place], [address] Phone: [phone]   Fax:  [phone]  Physical Therapy Treatment  Patient Details  Name: Greg Flowers MRN: [id number] Date of Birth: 12/12/1955 Referring Provider: Barnett Abu, MD  Encounter Date: 02/07/2017      PT End of Session - 02/07/17 0930    Visit Number 7   Number of Visits 12   Date for PT Re-Evaluation 03/01/17   PT Start Time 0930   PT Stop Time 1025   PT Time Calculation (min) 55 min   Activity Tolerance Patient tolerated treatment well   Behavior During Therapy [facility] for tasks assessed/performed      Past Medical History:  Diagnosis Date  . Anxiety   . Calculus, kidney 2000   Sees Dr. Isabel Caprice, urology.   . Cervical spondylosis 2004   sp surgery by Dr. Danielle Dess  . Depression   . Headache(784.0)    Chronic, on vicodin 5 TID for this.   . Hepatitis C    Has occasional visits with Dr. Randa Evens GI, failed RX in 2000.  Liver Biopsy 9/06: Minimally active hepatitis consistent with hepatitis C, minimal necroinflamtory activity grade 1, no incrased fibrosis stage 0.   . Herpes labialis   . Hypertension   . Pneumonia   . Renal stone   . Spondylolisthesis of lumbar region   . Thalassemia    HGB 9/09 14.4 wtih MCV 72.6  . Wears glasses     Past Surgical History:  Procedure Laterality Date  . CERVICAL DISCECTOMY  2004   Dr Danielle Dess  . COLONOSCOPY    . LITHOTRIPSY  2004   Dr Logan Bores  . TONSILLECTOMY      There were no vitals filed for this visit.      Subjective Assessment - 02/07/17 0935    Subjective No pain , stiffness in lower back.    Currently in Pain? No/denies                         [facility] Adult PT Treatment/Exercise - 02/07/17 0001      Lumbar Exercises: Stretches   Single Knee to Chest Stretch Limitations RT/LT   Double Knee to Chest Stretch 1 rep;30 seconds   Pelvic Tilt Limitations 10 reps 5 sec much cuing to engage  abdominals     Lumbar Exercises: Machines for Strengthening   Leg Press 55# bilateral    Other Lumbar Machine Exercise Seated Row 35# x 25 2 handles eac high/low   Other Lumbar Machine Exercise Lat pull down 35# x 25 cued to stabilize scapula     Lumbar Exercises: Supine   Other Supine Lumbar Exercises supine with feet on ball, bridge, LTR, hamstring curls with core bracing.    Other Supine Lumbar Exercises PPT, shoulder bridge,  bent knee raise all x 2-15 reps cueing for stability in pelvis and back     Lumbar Exercises: Quadruped   Madcat/Old Horse Limitations  prayer stretching 1 x 45 sec, plus x1  laterals    Opposite Arm/Leg Raise Right arm/Left leg;Left arm/Right leg;10 reps   Opposite Arm/Leg Raise Limitations cues for neutral lower spine and to elongate     Knee/Hip Exercises: Stretches   Hip Flexor Stretch Right;Left   Hip Flexor Stretch Limitations sidelying hip flexor stretch - to mimic gym routine      Knee/Hip Exercises: Aerobic   Elliptical Ramp 5 Resistance 5,   3 min  Nustep L5 UE/Le x 8 min                  PT Short Term Goals - 02/05/17 0856      PT SHORT TERM GOAL #1   Title pt will be independent with initial HEP   Time 2   Period Weeks   Status Achieved     PT SHORT TERM GOAL #2   Title He will report pain decreased 20-30% with normal activity   Time 3   Period Weeks   Status Achieved     PT SHORT TERM GOAL #3   Title He will demo understanding of good mechanics   Time 3   Period Weeks   Status Achieved           PT Long Term Goals - 01/17/17 1115      PT LONG TERM GOAL #1   Title FOTO will decreased to < 40% limited   Time 6   Period Weeks   Status New     PT LONG TERM GOAL #2   Title He will tolerated gym equipment exercise without incr back pain to tolerate joining gym   Time 6   Period Weeks   Status New     PT LONG TERM GOAL #3   Title He will report pain decreased 50% or more with normal activity   Time 6    Period Weeks   Status New     PT LONG TERM GOAL #4   Title He will be independent with all HEP issued   Time 6   Period Weeks   Status New               Plan - 02/07/17 0930    Clinical Impression Statement No pain with workout. He reorts able to do a 60 min workout without increased pain. May be ready for discharge at visit 10.    PT Treatment/Interventions Moist Heat;Dry needling;Manual techniques;Therapeutic exercise;Therapeutic activities;Patient/family education;Passive range of motion   PT Next Visit Plan manual, HEP,assess tolerance to gym machines and quadruped exercises, increase time on elliptical   PT Home Exercise Plan side bend and rotation stretching to LT, hip abduction and ER supine and standing, PPT ,shoulder bridge, clam side lye, prayer stretch   Consulted and Agree with Plan of Care Patient      Patient will benefit from skilled therapeutic intervention in order to improve the following deficits and impairments:  Pain, Decreased range of motion, Postural dysfunction, Impaired flexibility, Increased muscle spasms  Visit Diagnosis: Abnormal posture  Muscle spasm of back  Chronic bilateral low back pain without sciatica  Acute bilateral low back pain without sciatica  Muscle weakness (generalized)     Problem List Patient Active Problem List   Diagnosis Date Noted  . Back pain of thoracolumbar region 10/18/2016  . AKI (acute kidney injury) (HCC) 09/13/2016  . Thoracic radiculopathy 09/12/2016  . Spondylolisthesis at L4-L5 level 02/13/2016  . NSAID long-term use 01/09/2016  . Lumbar radiculopathy, acute 01/09/2016  . Preventative health care 02/12/2012  . Osteoarthritis of both knees 11/02/2011  . NECK AND BACK PAIN 11/03/2009  . FOOT PAIN, BILATERAL 09/20/2009  . HEADACHE 05/06/2009  . DISORDER, DEPRESSIVE NEC 09/25/2006  . HERPES LABIALIS 04/04/2006  . THALASSEMIA NEC 04/04/2006  . Chronic hepatitis C without hepatic coma (HCC) 02/22/2006   . Essential hypertension 02/22/2006  . CALCULUS, KIDNEY 02/22/2006  . SPONDYLOSIS, CERVICAL 02/22/2006  . COLONOSCOPY, HX OF 02/22/2006  Caprice Red  PT 02/07/2017, 10:19 AM  [facility] [address] [place], [place], [address] Phone: [phone]   Fax:  [phone]  Name: CAYLOR TALLARICO MRN: [id number] Date of Birth: [birth year]

## 2017-02-12 ENCOUNTER — Ambulatory Visit: Payer: Federal, State, Local not specified - PPO | Admitting: Physical Therapy

## 2017-02-12 DIAGNOSIS — R1011 Right upper quadrant pain: Secondary | ICD-10-CM | POA: Diagnosis not present

## 2017-02-12 DIAGNOSIS — M62830 Muscle spasm of back: Secondary | ICD-10-CM | POA: Diagnosis not present

## 2017-02-12 DIAGNOSIS — R293 Abnormal posture: Secondary | ICD-10-CM | POA: Diagnosis not present

## 2017-02-12 DIAGNOSIS — K210 Gastro-esophageal reflux disease with esophagitis: Secondary | ICD-10-CM | POA: Diagnosis not present

## 2017-02-12 DIAGNOSIS — M6281 Muscle weakness (generalized): Secondary | ICD-10-CM

## 2017-02-12 DIAGNOSIS — M545 Low back pain, unspecified: Secondary | ICD-10-CM

## 2017-02-12 DIAGNOSIS — R262 Difficulty in walking, not elsewhere classified: Secondary | ICD-10-CM | POA: Diagnosis not present

## 2017-02-12 DIAGNOSIS — R112 Nausea with vomiting, unspecified: Secondary | ICD-10-CM | POA: Diagnosis not present

## 2017-02-12 DIAGNOSIS — G8929 Other chronic pain: Secondary | ICD-10-CM | POA: Diagnosis not present

## 2017-02-12 DIAGNOSIS — K2210 Ulcer of esophagus without bleeding: Secondary | ICD-10-CM | POA: Diagnosis not present

## 2017-02-12 NOTE — Therapy (Signed)
[place], [place], [address] Phone: [phone]   Fax:  [phone]  Physical Therapy Treatment  Patient Details  Name: Greg Flowers MRN: [id number] Date of Birth: 03/31/1956 Referring Provider: Kristeen Miss, MD  Encounter Date: 02/12/2017      PT End of Session - 02/12/17 0935    Visit Number 8   Number of Visits 12   Date for PT Re-Evaluation 03/01/17   PT Start Time 0930   PT Stop Time 1012   PT Time Calculation (min) 42 min      Past Medical History:  Diagnosis Date  . Anxiety   . Calculus, kidney 2000   Sees Dr. Risa Grill, urology.   . Cervical spondylosis 2004   sp surgery by Dr. Ellene Route  . Depression   . Headache(784.0)    Chronic, on vicodin 5 TID for this.   . Hepatitis C    Has occasional visits with Dr. Oletta Lamas GI, failed RX in 2000.  Liver Biopsy 9/06: Minimally active hepatitis consistent with hepatitis C, minimal necroinflamtory activity grade 1, no incrased fibrosis stage 0.   . Herpes labialis   . Hypertension   . Pneumonia   . Renal stone   . Spondylolisthesis of lumbar region   . Thalassemia    HGB 9/09 14.4 wtih MCV 72.6  . Wears glasses     Past Surgical History:  Procedure Laterality Date  . CERVICAL DISCECTOMY  2004   Dr Ellene Route  . COLONOSCOPY    . LITHOTRIPSY  2004   Dr Amalia Hailey  . TONSILLECTOMY      There were no vitals filed for this visit.      Subjective Assessment - 02/12/17 0935    Subjective some stiffness   Currently in Pain? No/denies            [facility] PT Assessment - 02/12/17 0001      Observation/Other Assessments   Focus on Therapeutic Outcomes (FOTO)  46% limited                     [facility] Adult PT Treatment/Exercise - 02/12/17 0001      Lumbar Exercises: Machines for Strengthening   Leg Press 65 # bilateral  25 reps x 2    Other Lumbar Machine Exercise Seated Row 35# x 25 2 handles eac high/low   Other Lumbar Machine  Exercise Lat pull down 35# x 25 cued to stabilize scapula     Lumbar Exercises: Supine   Other Supine Lumbar Exercises PPT, shoulder bridge,  bent knee raise all x 2-15 reps cueing for stability in pelvis and back     Lumbar Exercises: Sidelying   Clam 20 reps   Clam Limitations RT/LT     Lumbar Exercises: Quadruped   Madcat/Old Horse 10 reps   Madcat/Old Horse Limitations  prayer stretching 1 x 45 sec, plus x1  laterals    Opposite Arm/Leg Raise Right arm/Left leg;Left arm/Right leg;10 reps   Opposite Arm/Leg Raise Limitations cues for neutral lower spine and to elongate     Knee/Hip Exercises: Stretches   Hip Flexor Stretch Right;Left   Hip Flexor Stretch Limitations sidelying hip flexor stretch - to mimic gym routine      Knee/Hip Exercises: Aerobic   Elliptical LE only Ramp 1 resistance 1 HR 105 bpm                  PT Short Term Goals - 02/05/17 3532  PT SHORT TERM GOAL #1   Title pt will be independent with initial HEP   Time 2   Period Weeks   Status Achieved     PT SHORT TERM GOAL #2   Title He will report pain decreased 20-30% with normal activity   Time 3   Period Weeks   Status Achieved     PT SHORT TERM GOAL #3   Title He will demo understanding of good mechanics   Time 3   Period Weeks   Status Achieved           PT Long Term Goals - 02/12/17 1024      PT LONG TERM GOAL #1   Title FOTO will decreased to < 40% limited   Baseline 46% improved from 53 % limited    Time 6   Period Weeks   Status On-going     PT LONG TERM GOAL #2   Title He will tolerated gym equipment exercise without incr back pain to tolerate joining gym   Baseline no pain in clinic with gym equipment x 3 visits    Time 6   Period Weeks   Status Achieved     PT LONG TERM GOAL #3   Title He will report pain decreased 50% or more with normal activity   Time 6   Period Weeks   Status Unable to assess     PT LONG TERM GOAL #4   Title He will be independent  with all HEP issued   Time 6   Period Weeks   Status On-going               Plan - 02/12/17 1002    Clinical Impression Statement Pt reports no pain today, only stiffness lately. He is doing well with core exercises and gym machines. He plans to join the gym in 2 weeks. He will be back to [place] at the end of next week due to having family in town. he may be ready for dc at this time as this is his last scheduled visit. LTG# 2 met. FOTO score improved. Progressing toward all LTGs.    PT Next Visit Plan manual, HEP,assess tolerance to gym machines and quadruped exercises, increase time on elliptical: possible DC next visit?    PT Home Exercise Plan side bend and rotation stretching to LT, hip abduction and ER supine and standing, PPT ,shoulder bridge, clam side lye, prayer stretch   Consulted and Agree with Plan of Care Patient      Patient will benefit from skilled therapeutic intervention in order to improve the following deficits and impairments:  Pain, Decreased range of motion, Postural dysfunction, Impaired flexibility, Increased muscle spasms  Visit Diagnosis: Abnormal posture  Muscle spasm of back  Chronic bilateral low back pain without sciatica  Acute bilateral low back pain without sciatica  Muscle weakness (generalized)  Difficulty in walking, not elsewhere classified     Problem List Patient Active Problem List   Diagnosis Date Noted  . Back pain of thoracolumbar region 10/18/2016  . AKI (acute kidney injury) ([place]) 09/13/2016  . Thoracic radiculopathy 09/12/2016  . Spondylolisthesis at L4-L5 level 02/13/2016  . NSAID long-term use 01/09/2016  . Lumbar radiculopathy, acute 01/09/2016  . Preventative health care 02/12/2012  . Osteoarthritis of both knees 11/02/2011  . NECK AND BACK PAIN 11/03/2009  . FOOT PAIN, BILATERAL 09/20/2009  . HEADACHE 05/06/2009  . DISORDER, DEPRESSIVE NEC 09/25/2006  . HERPES LABIALIS 04/04/2006  . THALASSEMIA  NEC 04/04/2006  .  Chronic hepatitis C without hepatic coma ([place]) 02/22/2006  . Essential hypertension 02/22/2006  . CALCULUS, KIDNEY 02/22/2006  . SPONDYLOSIS, CERVICAL 02/22/2006  . COLONOSCOPY, HX OF 02/22/2006    Dorene Ar, PTA 02/12/2017, 10:40 AM  [place], [place], [address] Phone: [phone]   Fax:  [phone]  Name: KAWHI DIEBOLD MRN: [id number] Date of Birth: 1955-06-03

## 2017-02-14 ENCOUNTER — Encounter: Payer: Federal, State, Local not specified - PPO | Admitting: Physical Therapy

## 2017-02-19 ENCOUNTER — Encounter: Payer: Self-pay | Admitting: Sports Medicine

## 2017-02-19 ENCOUNTER — Ambulatory Visit (INDEPENDENT_AMBULATORY_CARE_PROVIDER_SITE_OTHER): Payer: Federal, State, Local not specified - PPO | Admitting: Sports Medicine

## 2017-02-19 ENCOUNTER — Ambulatory Visit: Payer: Federal, State, Local not specified - PPO | Admitting: Orthotics

## 2017-02-19 DIAGNOSIS — M2022 Hallux rigidus, left foot: Secondary | ICD-10-CM

## 2017-02-19 DIAGNOSIS — M79674 Pain in right toe(s): Secondary | ICD-10-CM

## 2017-02-19 DIAGNOSIS — M2021 Hallux rigidus, right foot: Secondary | ICD-10-CM | POA: Diagnosis not present

## 2017-02-19 DIAGNOSIS — M79675 Pain in left toe(s): Secondary | ICD-10-CM

## 2017-02-19 MED ORDER — MELOXICAM 15 MG PO TABS: 15.0000 mg | ORAL_TABLET | Freq: Every day | ORAL | 3 refills | Status: DC

## 2017-02-19 NOTE — Progress Notes (Signed)
Patient ID: Lorriane Shireerry L Harbeson, male   DOB: 12-03-1955, 61 y.o.   MRN: [id number]   Subjective: Lorriane Shireerry L Plush is a 61 y.o. male patient who returns to office for evaluation of Right<Left big toe joint pain. Patient reports that he got orthotics today and that his Mobic helped greatly; Reports that he did good for homecoming and at most may had 3/10 throbbing but otherwise no other issues or complaints.   Patient Active Problem List   Diagnosis Date Noted  . Back pain of thoracolumbar region 10/18/2016  . AKI (acute kidney injury) ([facility]) 09/13/2016  . Thoracic radiculopathy 09/12/2016  . Spondylolisthesis at L4-L5 level 02/13/2016  . NSAID long-term use 01/09/2016  . Lumbar radiculopathy, acute 01/09/2016  . Preventative health care 02/12/2012  . Osteoarthritis of both knees 11/02/2011  . NECK AND BACK PAIN 11/03/2009  . FOOT PAIN, BILATERAL 09/20/2009  . HEADACHE 05/06/2009  . DISORDER, DEPRESSIVE NEC 09/25/2006  . HERPES LABIALIS 04/04/2006  . THALASSEMIA NEC 04/04/2006  . Chronic hepatitis C without hepatic coma ([facility]) 02/22/2006  . Essential hypertension 02/22/2006  . CALCULUS, KIDNEY 02/22/2006  . SPONDYLOSIS, CERVICAL 02/22/2006  . COLONOSCOPY, HX OF 02/22/2006    Current Outpatient Medications on File Prior to Visit  Medication Sig Dispense Refill  . amLODipine (NORVASC) 10 MG tablet Take 1 tablet (10 mg total) by mouth daily. 90 tablet 2  . Ascorbic Acid (VITAMIN C) 1000 MG tablet Take 1,000 mg by mouth daily.    Marland Kitchen. atenolol (TENORMIN) 50 MG tablet TAKE 1 TABLET (50 MG TOTAL) BY MOUTH DAILY. 90 tablet 2  . benazepril (LOTENSIN) 40 MG tablet Take 2 tablets (80 mg total) by mouth daily. 180 tablet 2  . FLUoxetine (PROZAC) 40 MG capsule Take 120 mg by mouth at bedtime.     . gabapentin (NEURONTIN) 300 MG capsule Take 1 capsule (300 mg total) by mouth 3 (three) times daily. (Patient not taking: Reported on 01/17/2017) 90 capsule 0  . Garlic 2000 MG CAPS Take 4,000 mg by mouth daily.     . hydrochlorothiazide (HYDRODIURIL) 25 MG tablet TAKE 1 TABLET (25 MG TOTAL) BY MOUTH DAILY. 90 tablet 2  . Ledipasvir-Sofosbuvir (HARVONI) 90-400 MG TABS Take 1 tablet by mouth daily. (Patient not taking: Reported on 01/17/2017) 28 tablet 1  . lidocaine (LIDODERM) 5 % Place 1 patch onto the skin every 12 (twelve) hours. Remove & Discard patch within 12 hours or as directed by MD 30 patch 0  . Multiple Vitamins-Minerals ([employer] SPECTRAVITE ULTRA MEN 50+ PO) Take 1 tablet by mouth daily.    . ondansetron (ZOFRAN ODT) 4 MG disintegrating tablet Take 1 tablet (4 mg total) by mouth every 8 (eight) hours as needed for nausea or vomiting. (Patient not taking: Reported on 01/17/2017) 30 tablet 1  . oxyCODONE-acetaminophen (PERCOCET) 10-325 MG tablet Take 1 tablet by mouth every 8 (eight) hours as needed for pain. (Patient not taking: Reported on 01/17/2017) 15 tablet 0  . promethazine (PHENERGAN) 12.5 MG tablet Take 1 tablet (12.5 mg total) by mouth every 6 (six) hours as needed for nausea or vomiting. 30 tablet 5  . sodium chloride (OCEAN) 0.65 % SOLN nasal spray Place 2 sprays into both nostrils as needed for congestion.    . Tamsulosin HCl (FLOMAX) 0.4 MG CAPS Take 0.4 mg by mouth at bedtime.      . Tetrahydrozoline HCl (VISINE OP) Place 2 drops into both eyes as needed (for dry eyes).    . traMADol (ULTRAM) 50  MG tablet Take 1 tablet (50 mg total) by mouth 2 (two) times daily as needed. (Patient not taking: Reported on 01/17/2017) 20 tablet 0  . zolpidem (AMBIEN) 5 MG tablet Take 5 mg by mouth at bedtime as needed for sleep.     Current Facility-Administered Medications on File Prior to Visit  Medication Dose Route Frequency Provider Last Rate Last Dose  . triamcinolone acetonide (KENALOG) 10 MG/ML injection 10 mg  10 mg Other Once Asencion IslamStover, Rykar Lebleu, DPM        No Known Allergies  Objective:  General: Alert and oriented x3 in no acute distress  Dermatology: No open lesions bilateral lower extremities,  no webspace macerations, no ecchymosis bilateral, Mild hypopigmented areas to bilateral lower extremities, all nails x 10 are well manicured.  Vascular: Dorsalis Pedis 2/4 and Posterior Tibial pedal pulses 1/4, Capillary Fill Time 3 seconds, (+) pedal hair growth bilateral, no edema bilateral lower extremities, Temperature gradient within normal limits.  Neurology: Michaell CowingGross sensation intact via light touch bilateral, Protective sensation intact  with Semmes Weinstein Monofilament to all pedal sites, Position sense intact, vibratory intact bilateral, Deep tendon reflexes within normal limits bilateral, No babinski sign present bilateral. (-) Tinels sign bilateral.  Musculoskeletal: Minimal tenderness with palpation right<left exostosis at dorsal 1st MTPJ bilateral with limitation without crepitus with end range of motion, no 1st ray hypermobility noted bilateral. Midtarsal, Subtalar joint, and ankle joint range of motion is within normal limits. Pes planus foot type and mild hammertoe bilateral.       Assessment and Plan: Problem List Items Addressed This Visit    None    Visit Diagnoses    Hallux rigidus of both feet       Relevant Medications   meloxicam (MOBIC) 15 MG tablet   Toe pain, bilateral       Relevant Medications   meloxicam (MOBIC) 15 MG tablet      -Complete examination performed -Discussed continued care for Hallux rigidus;conservative and  Surgical management (chielectomy vs decompression osteotomy); risks, benefits, alternatives discussed. All patient's questions answered. -Patient elects to continue with conservative care -Continue with orthotics with wear instructions as given  -Refilled Mobic  -Recommend continue with good supportive shoes and custom function foot orthotics  -Patient to return to office 2 months for orthotic check or sooner if condition worsens.  Asencion Islamitorya Kinzie Wickes, DPM

## 2017-02-19 NOTE — Progress Notes (Signed)
Adjusted f/o to fit better in work shoes; took 1/8" off heel.

## 2017-02-20 ENCOUNTER — Ambulatory Visit (INDEPENDENT_AMBULATORY_CARE_PROVIDER_SITE_OTHER): Payer: Federal, State, Local not specified - PPO | Admitting: Pharmacist

## 2017-02-20 DIAGNOSIS — Z23 Encounter for immunization: Secondary | ICD-10-CM | POA: Diagnosis not present

## 2017-02-20 DIAGNOSIS — B182 Chronic viral hepatitis C: Secondary | ICD-10-CM | POA: Diagnosis not present

## 2017-02-20 NOTE — Progress Notes (Signed)
HPI: Greg Flowers is a 61 y.o. male who presents to the [facility] pharmacy clinic for Hep C follow-up and for his 2nd Hep B vaccine.  He has genotype 1b, F3/F4 fibrosis, treatment experience with PEG/ribavirin x 48 weeks, and completed 12 weeks of Harvoni on 9/26.  Lab Results  Component Value Date   HCVGENOTYPE 1b 10/03/2016    Allergies: No Known Allergies  Past Medical History: Past Medical History:  Diagnosis Date  . Anxiety   . Calculus, kidney 2000   Sees Dr. Isabel CapriceGrapey, urology.   . Cervical spondylosis 2004   sp surgery by Dr. Danielle DessElsner  . Depression   . Headache(784.0)    Chronic, on vicodin 5 TID for this.   . Hepatitis C    Has occasional visits with Dr. Randa EvensEdwards GI, failed RX in 2000.  Liver Biopsy 9/06: Minimally active hepatitis consistent with hepatitis C, minimal necroinflamtory activity grade 1, no incrased fibrosis stage 0.   . Herpes labialis   . Hypertension   . Pneumonia   . Renal stone   . Spondylolisthesis of lumbar region   . Thalassemia    HGB 9/09 14.4 wtih MCV 72.6  . Wears glasses     Social History: Social History   Socioeconomic History  . Marital status: Married    Spouse name: Not on file  . Number of children: Not on file  . Years of education: Not on file  . Highest education level: Not on file  Social Needs  . Financial resource strain: Not on file  . Food insecurity - worry: Not on file  . Food insecurity - inability: Not on file  . Transportation needs - medical: Not on file  . Transportation needs - non-medical: Not on file  Occupational History  . Not on file  Tobacco Use  . Smoking status: Former Games developermoker  . Smokeless tobacco: Never Used  . Tobacco comment: quit in the early 1980's  Substance and Sexual Activity  . Alcohol use: Yes    Comment: Occasionally  . Drug use: No  . Sexual activity: Yes    Partners: Female  Other Topics Concern  . Not on file  Social History Narrative   Works at post [employer], Married, Exercises.      Labs: Hep B S Ab (no units)  Date Value  10/03/2016 NEG   Hepatitis B Surface Ag (no units)  Date Value  10/03/2016 NEGATIVE    Lab Results  Component Value Date   HCVGENOTYPE 1b 10/03/2016    Hepatitis C RNA quantitative Latest Ref Rng & Units 01/16/2017 12/12/2016 11/05/2016 09/09/2013  HCV Quantitative NOT DETECTED IU/mL - <15 NOT DETECTED <15 DETECTED(A) 8,295,621(H3,131,794(H)  HCV Quantitative Log NOT DETECT Log IU/mL <1.18 NOT DETECTED <1.18 NOT DETECTED <1.18 DETECTED(A) 6.50(H)    AST  Date Value  10/03/2016 31 U/L  09/12/2016 29 IU/L  09/08/2016 28 U/L   ALT  Date Value  10/03/2016 38 U/L  10/03/2016 35 U/L  09/12/2016 33 IU/L  09/08/2016 32 U/L   INR (no units)  Date Value  10/03/2016 1.0  11/23/2013 0.98    CrCl: CrCl cannot be calculated (Patient's most recent lab result is older than the maximum 21 days allowed.).  Fibrosis Score: F3/F4 as assessed by [facility]   Child-Pugh Score: A  Previous Treatment Regimen: Peg/ribavirin x 48 weeks  Assessment: Greg Flowers is here today to get his 2nd Hep B vaccination.  He completed 12 weeks of Harvoni around the end of September without issues.  I made a f/u appointment with Dr. Luciana Flowers for January to make sure he doesn't need any additional follow-ups or referrals. He will also come back and see Greg Flowers in March for SVR24 and his 3rd Hep B vaccine.    Plans: - 2nd Hep B vaccine - F/u with Dr. Luciana Flowers 04/30/17 at 10:15am - [facility]  Buel Molder L. Docie Abramovich, PharmD, AAHIVP, CPP Infectious Diseases Clinical Pharmacist [facility] 02/20/2017, 10:58 AM

## 2017-02-21 ENCOUNTER — Ambulatory Visit: Payer: Federal, State, Local not specified - PPO | Attending: Neurological Surgery

## 2017-02-21 DIAGNOSIS — M6281 Muscle weakness (generalized): Secondary | ICD-10-CM | POA: Diagnosis not present

## 2017-02-21 DIAGNOSIS — M62830 Muscle spasm of back: Secondary | ICD-10-CM | POA: Diagnosis not present

## 2017-02-21 DIAGNOSIS — M545 Low back pain, unspecified: Secondary | ICD-10-CM

## 2017-02-21 DIAGNOSIS — R293 Abnormal posture: Secondary | ICD-10-CM

## 2017-02-21 DIAGNOSIS — R262 Difficulty in walking, not elsewhere classified: Secondary | ICD-10-CM

## 2017-02-21 DIAGNOSIS — G8929 Other chronic pain: Secondary | ICD-10-CM

## 2017-02-21 NOTE — Therapy (Signed)
[place], [place], [address] Phone: [phone]   Fax:  [phone]  Physical Therapy Treatment/Discharge  Patient Details  Name: Greg Flowers MRN: [id number] Date of Birth: 06-13-1955 Referring Provider: Kristeen Miss, MD   Encounter Date: 02/21/2017  PT End of Session - 02/21/17 0938    Visit Number  9    Number of Visits  12    Date for PT Re-Evaluation  03/01/17    PT Start Time  0931    PT Stop Time  1015    PT Time Calculation (min)  44 min    Activity Tolerance  Patient tolerated treatment well    Behavior During Therapy  [facility] for tasks assessed/performed       Past Medical History:  Diagnosis Date  . Anxiety   . Calculus, kidney 2000   Sees Dr. Risa Grill, urology.   . Cervical spondylosis 2004   sp surgery by Dr. Ellene Route  . Depression   . Headache(784.0)    Chronic, on vicodin 5 TID for this.   . Hepatitis C    Has occasional visits with Dr. Oletta Lamas GI, failed RX in 2000.  Liver Biopsy 9/06: Minimally active hepatitis consistent with hepatitis C, minimal necroinflamtory activity grade 1, no incrased fibrosis stage 0.   . Herpes labialis   . Hypertension   . Pneumonia   . Renal stone   . Spondylolisthesis of lumbar region   . Thalassemia    HGB 9/09 14.4 wtih MCV 72.6  . Wears glasses     Past Surgical History:  Procedure Laterality Date  . CERVICAL DISCECTOMY  2004   Dr Ellene Route  . COLONOSCOPY    . LITHOTRIPSY  2004   Dr Amalia Hailey  . TONSILLECTOMY      There were no vitals filed for this visit.  Subjective Assessment - 02/21/17 0942    Subjective  walking and doing church exercise program.   No problems.  No problems with HEP.       Currently in Pain?  No/denies                      [facility] Adult PT Treatment/Exercise - 02/21/17 0001      Lumbar Exercises: Stretches   Pelvic Tilt Limitations  10 reps 5 sec much  less cuing to engage abdominals today . Improved       Lumbar Exercises: Machines for Strengthening   Leg Press  100 pounds  x 12 reps    Other Lumbar Machine Exercise  Seated Row 35# x 15 2 handles /low then 20 reps 40 pounds    Other Lumbar Machine Exercise  Lat pull down 35# x 15 cued to stabilize scapula, then 20 reps with 40 pounds      Lumbar Exercises: Supine   Other Supine Lumbar Exercises  PPT, shoulder bridge,  bent knee raise all x 2-15 reps cueing for stability in pelvis and back      Lumbar Exercises: Quadruped   Madcat/Old Horse  10 reps    Madcat/Old Horse Limitations   prayer stretching 1 x 45 sec then arm/leg lift, plus x1  laterals     Opposite Arm/Leg Raise  Right arm/Left leg;Left arm/Right leg;10 reps    Opposite Arm/Leg Raise Limitations  Good posture /technique with this today      Knee/Hip Exercises: Aerobic   Nustep  L5 UE/Le x 7 min  PT Education - 02/21/17 1014    Education provided  Yes    Education Details  discussed support back and neck when using equipment at gym if he joins    Person(s) Educated  Patient    Methods  Explanation;Demonstration    Comprehension  Verbalized understanding       PT Short Term Goals - 02/05/17 0856      PT SHORT TERM GOAL #1   Title  pt will be independent with initial HEP    Time  2    Period  Weeks    Status  Achieved      PT SHORT TERM GOAL #2   Title  He will report pain decreased 20-30% with normal activity    Time  3    Period  Weeks    Status  Achieved      PT SHORT TERM GOAL #3   Title  He will demo understanding of good mechanics    Time  3    Period  Weeks    Status  Achieved        PT Long Term Goals - 02/21/17 6283      PT LONG TERM GOAL #1   Title  FOTO will decreased to < 40% limited    Baseline  46% improved from 53 % limited     Status  Partially Met      PT LONG TERM GOAL #2   Title  He will tolerated gym equipment exercise without incr back pain to tolerate joining gym    Baseline  no pain in clinic with gym  equipment x 4 visits     Status  Achieved      PT LONG TERM GOAL #3   Title  He will report pain decreased 50% or more with normal activity    Status  Achieved      PT LONG TERM GOAL #4   Title  He will be independent with all HEP issued    Status  Achieved            Plan - 02/21/17 0938    Clinical Impression Statement  Ready for discharge . Pain controlled in back.   Very active with church exercise program and able to do all HEP issued here.      PT Treatment/Interventions  Moist Heat;Dry needling;Manual techniques;Therapeutic exercise;Therapeutic activities;Patient/family education;Passive range of motion    PT Next Visit Plan  discharge with HEP    PT Home Exercise Plan  side bend and rotation stretching to LT, hip abduction and ER supine and standing, PPT ,shoulder bridge, clam side lye, prayer stretch    Consulted and Agree with Plan of Care  Patient       Patient will benefit from skilled therapeutic intervention in order to improve the following deficits and impairments:  Pain, Decreased range of motion, Postural dysfunction, Impaired flexibility, Increased muscle spasms  Visit Diagnosis: Abnormal posture  Muscle spasm of back  Chronic bilateral low back pain without sciatica  Acute bilateral low back pain without sciatica  Difficulty in walking, not elsewhere classified  Muscle weakness (generalized)     Problem List Patient Active Problem List   Diagnosis Date Noted  . Back pain of thoracolumbar region 10/18/2016  . AKI (acute kidney injury) ([place]) 09/13/2016  . Thoracic radiculopathy 09/12/2016  . Spondylolisthesis at L4-L5 level 02/13/2016  . NSAID long-term use 01/09/2016  . Lumbar radiculopathy, acute 01/09/2016  . Preventative health care 02/12/2012  . Osteoarthritis  of both knees 11/02/2011  . NECK AND BACK PAIN 11/03/2009  . FOOT PAIN, BILATERAL 09/20/2009  . HEADACHE 05/06/2009  . DISORDER, DEPRESSIVE NEC 09/25/2006  . HERPES LABIALIS  04/04/2006  . THALASSEMIA NEC 04/04/2006  . Chronic hepatitis C without hepatic coma ([place]) 02/22/2006  . Essential hypertension 02/22/2006  . CALCULUS, KIDNEY 02/22/2006  . SPONDYLOSIS, CERVICAL 02/22/2006  . COLONOSCOPY, HX OF 02/22/2006    [name] 02/21/2017, 10:17 AM  [facility] [address] [place], [place], [address] Phone: [phone]   Fax:  [phone]  Name: Greg Flowers MRN: [id number] Date of Birth: 30-Oct-1955  PHYSICAL THERAPY DISCHARGE SUMMARY  Visits from Start of Care: 9  Current functional level related to goals / functional outcomes: See above   Remaining deficits: See above   Education / Equipment: HEP  Plan: Patient agrees to discharge.  Patient goals were met. Patient is being discharged due to meeting the stated rehab goals.  ?????

## 2017-02-28 ENCOUNTER — Encounter: Payer: Self-pay | Admitting: Internal Medicine

## 2017-03-02 ENCOUNTER — Telehealth (INDEPENDENT_AMBULATORY_CARE_PROVIDER_SITE_OTHER): Payer: Self-pay

## 2017-03-04 NOTE — Telephone Encounter (Signed)
Greg Flowers, pt needs appt w/ pcp asap for pain med

## 2017-03-04 NOTE — Telephone Encounter (Signed)
Will not refill medication. Percocet last prescribed 8/13 15 tablets (reviewed [place] database). Needs to schedule appointment in clinic to assess pain since he does not take this chronically. Appointment ideally with me since I have not met him before but if no slots available soon in my CC can be seen in [facility].

## 2017-03-05 NOTE — Telephone Encounter (Signed)
Dr. Lovenia KimSantos-Sanchez will not have any openings until January 2019.  [facility] 03-12-17 at 8:45 am.

## 2017-03-08 ENCOUNTER — Encounter: Payer: Self-pay | Admitting: Internal Medicine

## 2017-03-12 ENCOUNTER — Telehealth: Payer: Self-pay | Admitting: *Deleted

## 2017-03-12 ENCOUNTER — Ambulatory Visit: Payer: Federal, State, Local not specified - PPO

## 2017-03-13 DIAGNOSIS — N289 Disorder of kidney and ureter, unspecified: Secondary | ICD-10-CM | POA: Diagnosis not present

## 2017-03-20 DIAGNOSIS — R350 Frequency of micturition: Secondary | ICD-10-CM | POA: Diagnosis not present

## 2017-03-20 DIAGNOSIS — N401 Enlarged prostate with lower urinary tract symptoms: Secondary | ICD-10-CM | POA: Diagnosis not present

## 2017-03-20 DIAGNOSIS — R351 Nocturia: Secondary | ICD-10-CM | POA: Diagnosis not present

## 2017-04-04 ENCOUNTER — Other Ambulatory Visit: Payer: Self-pay | Admitting: *Deleted

## 2017-04-04 MED ORDER — GABAPENTIN 300 MG PO CAPS: 300.0000 mg | ORAL_CAPSULE | Freq: Three times a day (TID) | ORAL | 5 refills | Status: DC

## 2017-04-04 NOTE — Telephone Encounter (Signed)
Thank you! I will refill his gabapentin. I sent him a message through MyChart weeks ago and advised him to schedule an appt with me to go over his results.

## 2017-04-04 NOTE — Telephone Encounter (Signed)
Received return call from patient-he needs refill on gabapentin 300mg  capsules.  Pt also [facility] to [facility] that he completed stool test in Oct 2018 with [facility] and requested to have info updated in MyChart.  [facility] informed [facility] have [facility] get result from [facility] office prior to updating info in chart. Pt also stated he will call back to schedule an appt with his pcp.Kingsley SpittleGoldston, Darlene Cassady12/20/201812:30 PM

## 2017-04-04 NOTE — Telephone Encounter (Signed)
Received faxed refill request from pt's pharmacy for gabapentin 100mg  caps take on capsule by mouth three times daily.  Pt chart reflects gabapentin 300mg  capsules take one capsule by mouth three times daily.  Attempted to contact pt to confirm dose-no answer, HIPPA compliant message left on recorder.Criss AlvineGoldston, Kyera Felan Cassady12/20/20189:40 AM

## 2017-04-04 NOTE — Telephone Encounter (Signed)
Thanks for letting me know. Seems like his gabapentin dose was increased to 300 in July of this year. I'm ok refilling gabapentin 300 mg TID since he has a long history of neuropathic pain.

## 2017-04-15 ENCOUNTER — Other Ambulatory Visit: Payer: Self-pay | Admitting: Gastroenterology

## 2017-04-15 DIAGNOSIS — R1011 Right upper quadrant pain: Secondary | ICD-10-CM

## 2017-04-15 DIAGNOSIS — R140 Abdominal distension (gaseous): Secondary | ICD-10-CM

## 2017-04-17 ENCOUNTER — Ambulatory Visit
Admission: RE | Admit: 2017-04-17 | Discharge: 2017-04-17 | Disposition: A | Discharge status: Home / Self Care | Payer: Federal, State, Local not specified - PPO | Source: Ambulatory Visit | Attending: Gastroenterology | Admitting: Gastroenterology

## 2017-04-17 DIAGNOSIS — R1011 Right upper quadrant pain: Secondary | ICD-10-CM

## 2017-04-17 DIAGNOSIS — R140 Abdominal distension (gaseous): Secondary | ICD-10-CM

## 2017-04-17 DIAGNOSIS — N200 Calculus of kidney: Secondary | ICD-10-CM | POA: Diagnosis not present

## 2017-04-18 DIAGNOSIS — M5416 Radiculopathy, lumbar region: Secondary | ICD-10-CM | POA: Diagnosis not present

## 2017-04-23 ENCOUNTER — Ambulatory Visit: Payer: Federal, State, Local not specified - PPO | Admitting: Sports Medicine

## 2017-04-24 ENCOUNTER — Other Ambulatory Visit: Payer: Self-pay | Admitting: Neurological Surgery

## 2017-04-24 ENCOUNTER — Other Ambulatory Visit (HOSPITAL_COMMUNITY): Payer: Self-pay | Admitting: Neurological Surgery

## 2017-04-24 DIAGNOSIS — M5416 Radiculopathy, lumbar region: Secondary | ICD-10-CM

## 2017-04-25 ENCOUNTER — Other Ambulatory Visit (HOSPITAL_COMMUNITY): Payer: Self-pay | Admitting: Gastroenterology

## 2017-04-25 DIAGNOSIS — K210 Gastro-esophageal reflux disease with esophagitis: Secondary | ICD-10-CM | POA: Diagnosis not present

## 2017-04-25 DIAGNOSIS — R1084 Generalized abdominal pain: Secondary | ICD-10-CM | POA: Diagnosis not present

## 2017-04-25 DIAGNOSIS — B1920 Unspecified viral hepatitis C without hepatic coma: Secondary | ICD-10-CM | POA: Diagnosis not present

## 2017-04-25 DIAGNOSIS — R140 Abdominal distension (gaseous): Secondary | ICD-10-CM | POA: Diagnosis not present

## 2017-04-26 ENCOUNTER — Ambulatory Visit: Payer: Federal, State, Local not specified - PPO | Admitting: Internal Medicine

## 2017-04-26 VITALS — BP 146/89 | HR 77 | Wt 215.3 lb

## 2017-04-26 DIAGNOSIS — Z87891 Personal history of nicotine dependence: Secondary | ICD-10-CM

## 2017-04-26 DIAGNOSIS — I10 Essential (primary) hypertension: Secondary | ICD-10-CM

## 2017-04-26 DIAGNOSIS — Z79899 Other long term (current) drug therapy: Secondary | ICD-10-CM | POA: Diagnosis not present

## 2017-04-26 DIAGNOSIS — M545 Low back pain: Secondary | ICD-10-CM

## 2017-04-26 DIAGNOSIS — G8929 Other chronic pain: Secondary | ICD-10-CM

## 2017-04-26 DIAGNOSIS — M5416 Radiculopathy, lumbar region: Secondary | ICD-10-CM | POA: Diagnosis not present

## 2017-04-26 DIAGNOSIS — Z981 Arthrodesis status: Secondary | ICD-10-CM

## 2017-04-26 MED ORDER — GABAPENTIN 600 MG PO TABS: 600.0000 mg | ORAL_TABLET | Freq: Three times a day (TID) | ORAL | 2 refills | Status: DC

## 2017-04-26 MED ORDER — ATENOLOL 50 MG PO TABS: ORAL_TABLET | ORAL | 2 refills | Status: DC

## 2017-04-26 MED ORDER — HYDROCHLOROTHIAZIDE 25 MG PO TABS: ORAL_TABLET | ORAL | 2 refills | Status: DC

## 2017-04-26 MED ORDER — BENAZEPRIL HCL 40 MG PO TABS: 80.0000 mg | ORAL_TABLET | Freq: Every day | ORAL | 5 refills | Status: DC

## 2017-04-26 MED ORDER — AMLODIPINE BESYLATE 10 MG PO TABS: 10.0000 mg | ORAL_TABLET | Freq: Every day | ORAL | 5 refills | Status: DC

## 2017-04-26 MED ORDER — DULOXETINE HCL 30 MG PO CPEP: 30.0000 mg | ORAL_CAPSULE | Freq: Every day | ORAL | 0 refills | Status: DC

## 2017-04-26 MED ORDER — DULOXETINE HCL 60 MG PO CPEP: 60.0000 mg | ORAL_CAPSULE | Freq: Every day | ORAL | 1 refills | Status: DC

## 2017-04-26 NOTE — Progress Notes (Signed)
   CC: Chronic back pain and HTN follow up   HPI:  [name] is a 62 y.o. male with PMH as listed below who presents to clinic for chronic back pain and HTN follow up. Please problem based assessment and plan for further details.   Past Medical History:  Diagnosis Date  . Anxiety   . Calculus, kidney 2000   Sees Dr. Isabel CapriceGrapey, urology.   . Cervical spondylosis 2004   sp surgery by Dr. Danielle DessElsner  . Depression   . Headache(784.0)    Chronic, on vicodin 5 TID for this.   . Hepatitis C    Has occasional visits with Dr. Randa EvensEdwards GI, failed RX in 2000.  Liver Biopsy 9/06: Minimally active hepatitis consistent with hepatitis C, minimal necroinflamtory activity grade 1, no incrased fibrosis stage 0.   . Herpes labialis   . Hypertension   . Pneumonia   . Renal stone   . Spondylolisthesis of lumbar region   . Thalassemia    HGB 9/09 14.4 wtih MCV 72.6  . Wears glasses    Review of Systems:   Review of Systems  Constitutional: Negative for chills, fever, malaise/fatigue and weight loss.  Respiratory: Negative for cough and shortness of breath.   Cardiovascular: Negative for chest pain, palpitations and leg swelling.  Gastrointestinal: Negative for abdominal pain, constipation and diarrhea.  Musculoskeletal: Positive for back pain. Negative for falls.  Neurological: Negative for dizziness, tingling, tremors, sensory change, focal weakness, weakness and headaches.    Physical Exam:  Vitals:   04/26/17 1424  BP: (!) 146/89  Pulse: 77  SpO2: 98%  Weight: 215 lb 4.8 oz (97.7 kg)   General: very pleasant male, well-developed, well-nourished, sitting up in chair in no acute distress CV: RRR, nl S1/S2, no mrg  Pulm: good air movement, CTAB, no wheezes or crackles, no increased WOB on RA  Neuro: A&Ox3, CN II-XII intact, sensation intact in all four extremities, no motor deficits. Leans forward to walk and walks slowly, but gait is steady  Ext: warm and well perfused, no peripheral  edema   Assessment & Plan:   See Encounters Tab for problem based charting.  Patient discussed with Dr. Cleda DaubE. Hoffman

## 2017-04-26 NOTE — Patient Instructions (Addendum)
It was nice to meet you today, Greg Flowers.  For your back pain I have increased your gabapentin to 600 mg 3 times a day.    I also added Cymbalta for your back pain.  You will take 1 tablet of 30 mg every day for 1 week.  You will then start taking 1 tablet of 60 mg for 2 months.  [facility]  I sent refills for all of your blood pressure medications as well.  Please let us know if these medications are not working for you.  Please call if you have any questions.

## 2017-04-27 ENCOUNTER — Encounter: Payer: Self-pay | Admitting: Internal Medicine

## 2017-04-27 NOTE — Assessment & Plan Note (Addendum)
BP 146/89 during this visit. He is on amlodipine 10, atenolol 50, benazepril 40, and HCTZ 25 and reports compliance. SCr 1.24 on 01/2017. BP 135/ 84 when rechecked.  Will continue current regimen.   - Continue current regimen. Refills provided for all 4 medications.  - Will follow up in 3 months for chronic back pain. If BP elevated will consider adjusting medication regimen.

## 2017-04-27 NOTE — Assessment & Plan Note (Signed)
Patient presents for follow up of chronic lower back pain . He has a history of thoracic and lumbar radiculopathy and had L4-L5 fusion surgery in 2017. States that he has been having pain since then that has progressively worsened. He used to enjoy yard work and he now unable to do this.He also stopped going to the gym. He denies lower extremity weakness, but reports unsteady gait and difficulty ambulating due to pain. He denies fever, chills, recent illness, and bladder/bowel incontinence. He follows up with neurosurgery and there is plan to obtain myelogram for further evaluation. He has tried NSAIDs, gabapentin, Tramadol, and short courses of Percocet for his pain. States that Percocet has been the only medication to relief pain.   On exam, he walks slowly and leans forward to walk, but his gait is steady. Cranial nerves are, sensation, and motor strength are intact.   Discussed with patient other pain control options such as Cymbalta and increasing gabapentin dose to 600 TID as he is only on 300 mg TID. We also discussed possibility of referring to pain clinic if these interventions do not work for him. Of note, he did have a pain contract with us in the past, but had a UDS positive for cocaine. I asked to follow up with me in 3 months.   - Increase gabapentin 300 TID --> 600 mg TID  - Cymbalta 30 mg x 1 week followed by Cymbalta 60 mg QD x 2 months  - Follow up in 3 months  - Will consider referral to pain clinic if these interventions fail

## 2017-04-29 NOTE — Progress Notes (Signed)
Internal Medicine Clinic Attending  Case discussed with Dr. Santos-Sanchez at the time of the visit.  We reviewed the resident's history and exam and pertinent patient test results.  I agree with the assessment, diagnosis, and plan of care documented in the resident's note.    

## 2017-04-30 ENCOUNTER — Other Ambulatory Visit: Payer: Self-pay

## 2017-04-30 ENCOUNTER — Encounter: Payer: Self-pay | Admitting: Internal Medicine

## 2017-04-30 ENCOUNTER — Ambulatory Visit (INDEPENDENT_AMBULATORY_CARE_PROVIDER_SITE_OTHER): Payer: Federal, State, Local not specified - PPO | Admitting: Internal Medicine

## 2017-04-30 VITALS — BP 131/81 | HR 79 | Temp 97.5°F | Ht 69.0 in | Wt 216.0 lb

## 2017-04-30 DIAGNOSIS — R140 Abdominal distension (gaseous): Secondary | ICD-10-CM

## 2017-04-30 DIAGNOSIS — K7400 Hepatic fibrosis, unspecified: Secondary | ICD-10-CM | POA: Insufficient documentation

## 2017-04-30 DIAGNOSIS — B182 Chronic viral hepatitis C: Secondary | ICD-10-CM

## 2017-04-30 NOTE — Assessment & Plan Note (Signed)
[facility] 6 months.  Last ultrasound this month by Dr. Randa EvensEdwards and no concerns.  Next due July 2019.  I will defer to his PCP to continue this unless otherwise indicated  Recent EGD with follow up as needed per Dr. Randa EvensEdwards. No varices reported per patient.

## 2017-04-30 NOTE — Assessment & Plan Note (Signed)
Has improved with ppi

## 2017-04-30 NOTE — Assessment & Plan Note (Signed)
Will check SVR12 today and rtc 3 months for final SVR 24 with PharmD

## 2017-04-30 NOTE — Progress Notes (Signed)
   Subjective:    Patient ID: Greg Flowers, male    DOB: 07-Jan-1956, 62 y.o.   MRN: [id number]  HPI Here for follow up of chronic hepatitis C Has genotype 1b, elastography F3/4 without cirrhosis on ultrasound with past treatment with PEG/riba for 48 weeks with relapse now s/p Harvoni treatment for 12 weeks, completed 9/26.  EOT lab undetectable.  He has seen Dr. Randa EvensEdwards GI and had an EGD, results not available but ulcers and no report of varices.  Has had some bloating and improved now with BID ppi.     Review of Systems  Constitutional: Negative for fatigue.  Gastrointestinal: Negative for diarrhea.  Skin: Negative for rash.       Objective:   Physical Exam  Constitutional: He appears well-developed and well-nourished. No distress.  HENT:  Mouth/Throat: No oropharyngeal exudate.  Eyes: No scleral icterus.  Cardiovascular: Normal rate, regular rhythm and normal heart sounds.  No murmur heard. Pulmonary/Chest: Effort normal and breath sounds normal. No respiratory distress.  Abdominal: Soft. He exhibits no distension.  Skin: No rash noted.   SH: rare alcohol       Assessment & Plan:

## 2017-05-02 LAB — HEPATITIS C RNA QUANTITATIVE
HCV Quantitative Log: <1.18 Log IU/mL
HCV RNA, PCR, QN: <15 IU/mL

## 2017-05-03 ENCOUNTER — Encounter: Payer: Self-pay | Admitting: Internal Medicine

## 2017-05-08 ENCOUNTER — Ambulatory Visit (HOSPITAL_COMMUNITY)
Admission: RE | Admit: 2017-05-08 | Discharge: 2017-05-08 | Disposition: A | Discharge status: Home / Self Care | Payer: Federal, State, Local not specified - PPO | Source: Ambulatory Visit | Attending: Neurological Surgery | Admitting: Neurological Surgery

## 2017-05-08 ENCOUNTER — Encounter: Payer: Self-pay | Admitting: Internal Medicine

## 2017-05-08 ENCOUNTER — Other Ambulatory Visit: Payer: Self-pay | Admitting: Internal Medicine

## 2017-05-08 DIAGNOSIS — M5416 Radiculopathy, lumbar region: Secondary | ICD-10-CM

## 2017-05-08 DIAGNOSIS — M5116 Intervertebral disc disorders with radiculopathy, lumbar region: Secondary | ICD-10-CM | POA: Diagnosis not present

## 2017-05-08 DIAGNOSIS — Z981 Arthrodesis status: Secondary | ICD-10-CM | POA: Diagnosis not present

## 2017-05-08 DIAGNOSIS — M5126 Other intervertebral disc displacement, lumbar region: Secondary | ICD-10-CM | POA: Diagnosis not present

## 2017-05-08 DIAGNOSIS — M48062 Spinal stenosis, lumbar region with neurogenic claudication: Secondary | ICD-10-CM | POA: Insufficient documentation

## 2017-05-08 MED ORDER — LIDOCAINE HCL (PF) 1 % IJ SOLN
INTRAMUSCULAR | Status: AC
  Administered 2017-05-08: 2 mL via INTRADERMAL
  Filled 2017-05-08: qty 5

## 2017-05-08 MED ORDER — IOPAMIDOL (ISOVUE-M 200) INJECTION 41%
INTRAMUSCULAR | Status: AC
  Administered 2017-05-08: 20 mL via INTRATHECAL
  Filled 2017-05-08: qty 10

## 2017-05-08 MED ORDER — DIAZEPAM 5 MG PO TABS
ORAL_TABLET | ORAL | Status: AC
  Administered 2017-05-08: 10 mg via ORAL
  Filled 2017-05-08: qty 2

## 2017-05-08 MED ORDER — ONDANSETRON HCL 4 MG/2ML IJ SOLN: 4.0000 mg | Freq: Four times a day (QID) | INTRAMUSCULAR | Status: DC | PRN

## 2017-05-08 MED ORDER — IOPAMIDOL (ISOVUE-M 200) INJECTION 41%
20.0000 mL | Freq: Once | INTRAMUSCULAR | Status: AC
  Administered 2017-05-08: 20 mL via INTRATHECAL

## 2017-05-08 MED ORDER — LIDOCAINE HCL (PF) 1 % IJ SOLN
5.0000 mL | Freq: Once | INTRAMUSCULAR | Status: AC
  Administered 2017-05-08: 2 mL via INTRADERMAL

## 2017-05-08 MED ORDER — OXYCODONE-ACETAMINOPHEN 5-325 MG PO TABS
1.0000 | ORAL_TABLET | ORAL | Status: DC | PRN
  Administered 2017-05-08: 2 via ORAL

## 2017-05-08 MED ORDER — DIAZEPAM 5 MG PO TABS
10.0000 mg | ORAL_TABLET | Freq: Once | ORAL | Status: AC
  Administered 2017-05-08: 10 mg via ORAL

## 2017-05-08 MED ORDER — OXYCODONE-ACETAMINOPHEN 5-325 MG PO TABS
ORAL_TABLET | ORAL | Status: AC
Start: 2017-05-08 — End: 2017-05-08
  Filled 2017-05-08: qty 2

## 2017-05-08 NOTE — Discharge Instructions (Signed)
**Note -identified via Obfuscation** Myelogram, Care After °These instructions give you information about caring for yourself after your procedure. Your doctor may also give you more specific instructions. Call your doctor if you have any problems or questions after your procedure. °Follow these instructions at home: °· Drink enough fluid to keep your pee (urine) clear or pale yellow. °· Rest as told by your doctor. °· Lie flat with your head slightly raised (elevated). °· Do not bend, lift, or do any hard activities for 24-48 hours or as told by your doctor. °· Take over-the-counter and prescription medicines only as told by your doctor. °· Take care of and remove your bandage (dressing) as told by your doctor. °· Bathe or shower as told by your doctor. °Contact a health care provider if: °· You have a fever. °· You have a headache that lasts longer than 24 hours. °· You feel sick to your stomach (nauseous). °· You throw up (vomit). °· Your neck is stiff. °· Your legs feel numb. °· You cannot pee. °· You cannot poop (have a bowel movement). °· You have a rash. °· You are itchy or sneezing. °Get help right away if: °· You have new symptoms or your symptoms get worse. °· You have a seizure. °· You have trouble breathing. °This information is not intended to replace advice given to you by your health care provider. Make sure you discuss any questions you have with your health care provider. °Document Released: 01/10/2008 Document Revised: 12/01/2015 Document Reviewed: 01/13/2015 °Elsevier Interactive Patient Education © 2018 [employer] Inc. ° °

## 2017-05-08 NOTE — Procedures (Signed)
Thereafter Greg Flowers is a 62 year old individual who's had significant stenosis at L4-L5. A number of years ago he underwent surgical decompression and stabilization he has had recurrent pain in his legs and his back has been having increasing difficulty with this problem the myelogram is now being performed as it is suspected that he may have some adjacent level disease.  Pre op Dx: Lumbar stenosis status post fusion L4-L5 Post op Dx: Lumbar stenosis status post fusion L4-L5 Procedure: Lumbar myelogram Surgeon: Tyria Springer Puncture level: L2-3 Fluid color: Clear colorless Injection: Isovue-200 12 mL Findings: Moderate stenosis L3-L4, mild findings at L5-S1, further workup with CT scanning

## 2017-05-10 ENCOUNTER — Ambulatory Visit (HOSPITAL_COMMUNITY)
Admission: RE | Admit: 2017-05-10 | Discharge: 2017-05-10 | Disposition: A | Discharge status: Home / Self Care | Payer: Federal, State, Local not specified - PPO | Source: Ambulatory Visit | Attending: Gastroenterology | Admitting: Gastroenterology

## 2017-05-10 DIAGNOSIS — R1084 Generalized abdominal pain: Secondary | ICD-10-CM | POA: Diagnosis not present

## 2017-05-10 DIAGNOSIS — R140 Abdominal distension (gaseous): Secondary | ICD-10-CM | POA: Diagnosis not present

## 2017-05-10 DIAGNOSIS — R109 Unspecified abdominal pain: Secondary | ICD-10-CM | POA: Diagnosis not present

## 2017-05-10 MED ORDER — TECHNETIUM TC 99M SULFUR COLLOID
2.0000 | Freq: Once | INTRAVENOUS | Status: AC | PRN
  Administered 2017-05-10: 2 via ORAL

## 2017-05-14 ENCOUNTER — Ambulatory Visit (HOSPITAL_COMMUNITY): Payer: Federal, State, Local not specified - PPO

## 2017-05-14 ENCOUNTER — Other Ambulatory Visit (HOSPITAL_COMMUNITY): Payer: Federal, State, Local not specified - PPO

## 2017-05-16 DIAGNOSIS — S32009K Unspecified fracture of unspecified lumbar vertebra, subsequent encounter for fracture with nonunion: Secondary | ICD-10-CM | POA: Diagnosis not present

## 2017-05-16 DIAGNOSIS — G039 Meningitis, unspecified: Secondary | ICD-10-CM | POA: Diagnosis not present

## 2017-05-18 ENCOUNTER — Encounter: Payer: Self-pay | Admitting: Internal Medicine

## 2017-05-31 DIAGNOSIS — K2210 Ulcer of esophagus without bleeding: Secondary | ICD-10-CM | POA: Diagnosis not present

## 2017-05-31 DIAGNOSIS — Z8619 Personal history of other infectious and parasitic diseases: Secondary | ICD-10-CM | POA: Diagnosis not present

## 2017-05-31 DIAGNOSIS — K210 Gastro-esophageal reflux disease with esophagitis: Secondary | ICD-10-CM | POA: Diagnosis not present

## 2017-06-03 DIAGNOSIS — M4726 Other spondylosis with radiculopathy, lumbar region: Secondary | ICD-10-CM | POA: Diagnosis not present

## 2017-06-03 DIAGNOSIS — M5416 Radiculopathy, lumbar region: Secondary | ICD-10-CM | POA: Diagnosis not present

## 2017-06-03 DIAGNOSIS — M48061 Spinal stenosis, lumbar region without neurogenic claudication: Secondary | ICD-10-CM | POA: Diagnosis not present

## 2017-06-03 DIAGNOSIS — M5136 Other intervertebral disc degeneration, lumbar region: Secondary | ICD-10-CM | POA: Diagnosis not present

## 2017-06-17 ENCOUNTER — Encounter: Payer: Self-pay | Admitting: Internal Medicine

## 2017-07-02 ENCOUNTER — Other Ambulatory Visit: Payer: Self-pay | Admitting: Internal Medicine

## 2017-07-02 DIAGNOSIS — M5416 Radiculopathy, lumbar region: Secondary | ICD-10-CM

## 2017-07-03 DIAGNOSIS — S32009K Unspecified fracture of unspecified lumbar vertebra, subsequent encounter for fracture with nonunion: Secondary | ICD-10-CM | POA: Diagnosis not present

## 2017-07-04 DIAGNOSIS — K229 Disease of esophagus, unspecified: Secondary | ICD-10-CM | POA: Diagnosis not present

## 2017-07-04 DIAGNOSIS — K283 Acute gastrojejunal ulcer without hemorrhage or perforation: Secondary | ICD-10-CM | POA: Diagnosis not present

## 2017-07-04 DIAGNOSIS — K210 Gastro-esophageal reflux disease with esophagitis: Secondary | ICD-10-CM | POA: Diagnosis not present

## 2017-07-04 DIAGNOSIS — K449 Diaphragmatic hernia without obstruction or gangrene: Secondary | ICD-10-CM | POA: Diagnosis not present

## 2017-07-05 ENCOUNTER — Other Ambulatory Visit (HOSPITAL_COMMUNITY): Payer: Self-pay | Admitting: Neurological Surgery

## 2017-07-05 DIAGNOSIS — S32009K Unspecified fracture of unspecified lumbar vertebra, subsequent encounter for fracture with nonunion: Secondary | ICD-10-CM

## 2017-07-09 ENCOUNTER — Ambulatory Visit (INDEPENDENT_AMBULATORY_CARE_PROVIDER_SITE_OTHER): Payer: Federal, State, Local not specified - PPO | Admitting: Pharmacist Clinician (PhC)/ Clinical Pharmacy Specialist

## 2017-07-09 DIAGNOSIS — B182 Chronic viral hepatitis C: Secondary | ICD-10-CM | POA: Diagnosis not present

## 2017-07-09 NOTE — Progress Notes (Signed)
HPI: Greg Flowers is a 62 y.o. male who is here for his SVR24  Lab Results  Component Value Date   HCVGENOTYPE 1b 10/03/2016    Allergies: No Known Allergies  Vitals:    Past Medical History: Past Medical History:  Diagnosis Date  . Anxiety   . Calculus, kidney 2000   Sees Dr. Isabel Caprice, urology.   . Cervical spondylosis 2004   sp surgery by Dr. Danielle Dess  . Depression   . Headache(784.0)    Chronic, on vicodin 5 TID for this.   . Hepatitis C    Has occasional visits with Dr. Randa Evens GI, failed RX in 2000.  Liver Biopsy 9/06: Minimally active hepatitis consistent with hepatitis C, minimal necroinflamtory activity grade 1, no incrased fibrosis stage 0.   . Herpes labialis   . Hypertension   . Pneumonia   . Renal stone   . Spondylolisthesis of lumbar region   . Thalassemia    HGB 9/09 14.4 wtih MCV 72.6  . Wears glasses     Social History: Social History   Socioeconomic History  . Marital status: Married    Spouse name: Not on file  . Number of children: Not on file  . Years of education: Not on file  . Highest education level: Not on file  Occupational History  . Not on file  Social Needs  . Financial resource strain: Not on file  . Food insecurity:    Worry: Not on file    Inability: Not on file  . Transportation needs:    Medical: Not on file    Non-medical: Not on file  Tobacco Use  . Smoking status: Former Games developer  . Smokeless tobacco: Never Used  . Tobacco comment: quit in the early 1980's  Substance and Sexual Activity  . Alcohol use: Yes    Comment: Occasionally  . Drug use: No  . Sexual activity: Yes    Partners: Female  Lifestyle  . Physical activity:    Days per week: Not on file    Minutes per session: Not on file  . Stress: Not on file  Relationships  . Social connections:    Talks on phone: Not on file    Gets together: Not on file    Attends religious service: Not on file    Active member of club or organization: Not on file   Attends meetings of clubs or organizations: Not on file    Relationship status: Not on file  Other Topics Concern  . Not on file  Social History Narrative   Works at post office, Married, Exercises.     Labs: Hep B S Ab (no units)  Date Value  10/03/2016 NEG   Hepatitis B Surface Ag (no units)  Date Value  10/03/2016 NEGATIVE    Lab Results  Component Value Date   HCVGENOTYPE 1b 10/03/2016    Hepatitis C RNA quantitative Latest Ref Rng & Units 04/30/2017 01/16/2017 12/12/2016 11/05/2016 09/09/2013  HCV Quantitative NOT DETECTED IU/mL - - <15 NOT DETECTED <15 DETECTED(A) 1,610,960(A)  HCV Quantitative Log NOT DETECT Log IU/mL <1.18 NOT DETECTED <1.18 NOT DETECTED <1.18 NOT DETECTED <1.18 DETECTED(A) 6.50(H)    AST  Date Value  10/03/2016 31 U/L  09/12/2016 29 IU/L  09/08/2016 28 U/L   ALT  Date Value  10/03/2016 38 U/L  10/03/2016 35 U/L  09/12/2016 33 IU/L  09/08/2016 32 U/L   INR (no units)  Date Value  10/03/2016 1.0  11/23/2013 0.98  CrCl: CrCl cannot be calculated (Patient's most recent lab result is older than the maximum 21 days allowed.).  Fibrosis Score: F3/4 as assessed by [facility]  Child-Pugh Score: Class A  Previous Treatment Regimen: Peg/riba x 48 wks  Assessment: Aeden completed his HAurther Loftarvoni about 6 months ago. Since he failed the course of peg/riba, Dr. Luciana Axeomer would like to get a SVR24. Every VL has been neg so far. He is also being f/u with GI for his ongoing cirrhosis management. He is due to for another US in July. He will call GI to get them to set up for him.   Recommendations:  SVR24 today  Minh Pham, Vermont[place]arm.D., BCPS, AAHIVP Clinical Infectious Disease Pharmacist [facility] 07/09/2017, 9:27 AM

## 2017-07-11 LAB — HEPATITIS C RNA QUANTITATIVE
HCV Quantitative Log: <1.18 Log IU/mL
HCV RNA, PCR, QN: <15 IU/mL

## 2017-07-12 DIAGNOSIS — K210 Gastro-esophageal reflux disease with esophagitis: Secondary | ICD-10-CM | POA: Diagnosis not present

## 2017-07-15 ENCOUNTER — Encounter (HOSPITAL_COMMUNITY)
Admission: RE | Admit: 2017-07-15 | Discharge: 2017-07-15 | Disposition: A | Discharge status: Home / Self Care | Payer: Federal, State, Local not specified - PPO | Source: Ambulatory Visit | Attending: Neurological Surgery | Admitting: Neurological Surgery

## 2017-07-15 DIAGNOSIS — X58XXXD Exposure to other specified factors, subsequent encounter: Secondary | ICD-10-CM | POA: Insufficient documentation

## 2017-07-15 DIAGNOSIS — S32009K Unspecified fracture of unspecified lumbar vertebra, subsequent encounter for fracture with nonunion: Secondary | ICD-10-CM | POA: Insufficient documentation

## 2017-07-15 DIAGNOSIS — M960 Pseudarthrosis after fusion or arthrodesis: Secondary | ICD-10-CM | POA: Diagnosis not present

## 2017-07-15 MED ORDER — TECHNETIUM TC 99M MEDRONATE IV KIT
21.9000 | PACK | Freq: Once | INTRAVENOUS | Status: AC | PRN
  Administered 2017-07-15: 21.9 via INTRAVENOUS

## 2017-07-16 ENCOUNTER — Other Ambulatory Visit: Payer: Self-pay | Admitting: Internal Medicine

## 2017-07-16 DIAGNOSIS — M5416 Radiculopathy, lumbar region: Secondary | ICD-10-CM

## 2017-07-19 ENCOUNTER — Encounter: Payer: Self-pay | Admitting: Internal Medicine

## 2017-08-06 ENCOUNTER — Ambulatory Visit: Payer: Federal, State, Local not specified - PPO | Admitting: Sports Medicine

## 2017-08-06 ENCOUNTER — Encounter: Payer: Self-pay | Admitting: Sports Medicine

## 2017-08-06 DIAGNOSIS — M2022 Hallux rigidus, left foot: Secondary | ICD-10-CM

## 2017-08-06 DIAGNOSIS — M2021 Hallux rigidus, right foot: Secondary | ICD-10-CM

## 2017-08-06 DIAGNOSIS — M79675 Pain in left toe(s): Secondary | ICD-10-CM

## 2017-08-06 DIAGNOSIS — M7750 Other enthesopathy of unspecified foot: Secondary | ICD-10-CM | POA: Diagnosis not present

## 2017-08-06 DIAGNOSIS — M779 Enthesopathy, unspecified: Secondary | ICD-10-CM

## 2017-08-06 DIAGNOSIS — M79674 Pain in right toe(s): Secondary | ICD-10-CM

## 2017-08-06 MED ORDER — TRIAMCINOLONE ACETONIDE 10 MG/ML IJ SUSP: 10.0000 mg | Freq: Once | INTRAMUSCULAR | Status: DC

## 2017-08-06 MED ORDER — MELOXICAM 15 MG PO TABS: 15.0000 mg | ORAL_TABLET | Freq: Every day | ORAL | 0 refills | Status: DC

## 2017-08-06 NOTE — Progress Notes (Signed)
Patient ID: Greg Flowers, male   DOB: 1956-03-06, 62 y.o.   MRN: [id number]   Subjective: Greg Flowers is a 62 y.o. male patient who returns to office for evaluation of Right<Left big toe joint pain. Patient reports that he wants another injection, his big toes flared up after playing rec basketball and has been using orthotics which helped. Patient denies any other pedal complaints.   Patient Active Problem List   Diagnosis Date Noted  . Liver fibrosis 04/30/2017  . Bloating symptom 04/30/2017  . Back pain of thoracolumbar region 10/18/2016  . Thoracic radiculopathy 09/12/2016  . Spondylolisthesis at L4-L5 level 02/13/2016  . NSAID long-term use 01/09/2016  . Lumbar radiculopathy, acute 01/09/2016  . Preventative health care 02/12/2012  . Osteoarthritis of both knees 11/02/2011  . NECK AND BACK PAIN 11/03/2009  . FOOT PAIN, BILATERAL 09/20/2009  . HEADACHE 05/06/2009  . DISORDER, DEPRESSIVE NEC 09/25/2006  . HERPES LABIALIS 04/04/2006  . THALASSEMIA NEC 04/04/2006  . Chronic hepatitis C without hepatic coma (HCC) 02/22/2006  . Essential hypertension 02/22/2006  . CALCULUS, KIDNEY 02/22/2006  . SPONDYLOSIS, CERVICAL 02/22/2006    Current Outpatient Medications on File Prior to Visit  Medication Sig Dispense Refill  . amLODipine (NORVASC) 10 MG tablet Take 1 tablet (10 mg total) by mouth daily. 30 tablet 5  . Ascorbic Acid (VITAMIN C) 1000 MG tablet Take 1,000 mg by mouth daily.    Marland Kitchen. atenolol (TENORMIN) 50 MG tablet TAKE 1 TABLET (50 MG TOTAL) BY MOUTH DAILY. 90 tablet 2  . benazepril (LOTENSIN) 40 MG tablet Take 2 tablets (80 mg total) by mouth daily. 60 tablet 5  . FLUoxetine (PROZAC) 20 MG tablet Take 60 mg by mouth daily.  5  . gabapentin (NEURONTIN) 300 MG capsule     . gabapentin (NEURONTIN) 600 MG tablet TAKE 1 TABLET BY MOUTH THREE TIMES A DAY 90 tablet 2  . Garlic 2000 MG CAPS Take 4,000 mg by mouth daily.    . hydrochlorothiazide (HYDRODIURIL) 25 MG tablet TAKE 1  TABLET (25 MG TOTAL) BY MOUTH DAILY. 90 tablet 2  . Multiple Vitamins-Minerals ([facility] SPECTRAVITE ULTRA MEN 50+ PO) Take 1 tablet by mouth daily.    Marland Kitchen. NOCTIVA 1.66 MCG/0.1ML EMUL ONE SPRAY IN ONE NOSTRIL AT BEDTIME  11  . omeprazole (PRILOSEC) 20 MG capsule Take 40 mg by mouth 2 (two) times daily before a meal.    . sodium chloride (OCEAN) 0.65 % SOLN nasal spray Place 2 sprays into both nostrils as needed for congestion.    . Tamsulosin HCl (FLOMAX) 0.4 MG CAPS Take 0.4 mg by mouth at bedtime.      . Tetrahydrozoline HCl (VISINE OP) Place 2 drops into both eyes as needed (for dry eyes).     No current facility-administered medications on file prior to visit.     No Known Allergies  Objective:  General: Alert and oriented x3 in no acute distress  Dermatology: No open lesions bilateral lower extremities, no webspace macerations, no ecchymosis bilateral, Mild hypopigmented areas to bilateral lower extremities, all nails x 10 are well manicured.  Vascular: Dorsalis Pedis 2/4 and Posterior Tibial pedal pulses 1/4, Capillary Fill Time 3 seconds, (+) pedal hair growth bilateral, no edema bilateral lower extremities, Temperature gradient within normal limits.  Neurology: Gross sensation intact via light touch bilateral, Protective sensation intact  with Phoebe PerchSemmes Weinstein Monofilament to all pedal sites, Position sense intact, vibratory intact bilateral, Deep tendon reflexes within normal limits bilateral, No babinski  sign present bilateral. (-) Tinels sign bilateral.  Musculoskeletal: Mild tenderness with palpation right<left exostosis at dorsal 1st MTPJ bilateral with limitation without crepitus with end range of motion, no 1st ray hypermobility noted bilateral. Midtarsal, Subtalar joint, and ankle joint range of motion is within normal limits. Pes planus foot type and mild hammertoe bilateral.       Assessment and Plan: Problem List Items Addressed This Visit    None    Visit Diagnoses     Hallux rigidus of both feet    -  Primary   Relevant Medications   triamcinolone acetonide (KENALOG) 10 MG/ML injection 10 mg   Toe pain, bilateral       Relevant Medications   triamcinolone acetonide (KENALOG) 10 MG/ML injection 10 mg   Capsulitis       Relevant Medications   triamcinolone acetonide (KENALOG) 10 MG/ML injection 10 mg      -Complete examination performed -Discussed continued care for Hallux rigidus;conservative and  Surgical management (chielectomy vs decompression osteotomy); risks, benefits, alternatives discussed again. All patient's questions answered. After oral consent and aseptic prep, injected a mixture containing 1 ml of 2% plain lidocaine, 1 ml 0.5% plain marcaine, 0.5 ml of kenalog 10 and 0.5 ml of dexamethasone phosphate into right and left 1st MTPJs without complication. Post-injection care discussed with patient.  -Refilled Mobic  -Recommend continue with good supportive shoes and custom function foot orthotics in all shoes  -Patient to return to office as needed or sooner if condition worsens.  Asencion Islam, DPM

## 2017-08-07 ENCOUNTER — Encounter: Payer: Self-pay | Admitting: Sports Medicine

## 2017-08-13 ENCOUNTER — Telehealth: Payer: Self-pay | Admitting: *Deleted

## 2017-08-13 NOTE — Telephone Encounter (Signed)
[facility] 90 day supply of gabapentin 600 mg. 30 day supply with 2 refills sent 07/16/2017. Kinnie Feil, RN, BSN

## 2017-08-14 ENCOUNTER — Other Ambulatory Visit: Payer: Self-pay | Admitting: Internal Medicine

## 2017-08-14 DIAGNOSIS — Z6831 Body mass index (BMI) 31.0-31.9, adult: Secondary | ICD-10-CM | POA: Diagnosis not present

## 2017-08-14 DIAGNOSIS — S32009K Unspecified fracture of unspecified lumbar vertebra, subsequent encounter for fracture with nonunion: Secondary | ICD-10-CM | POA: Diagnosis not present

## 2017-08-14 DIAGNOSIS — M5416 Radiculopathy, lumbar region: Secondary | ICD-10-CM

## 2017-08-14 DIAGNOSIS — I10 Essential (primary) hypertension: Secondary | ICD-10-CM | POA: Diagnosis not present

## 2017-08-14 MED ORDER — GABAPENTIN 600 MG PO TABS: 600.0000 mg | ORAL_TABLET | Freq: Three times a day (TID) | ORAL | 1 refills | Status: DC

## 2017-08-14 NOTE — Telephone Encounter (Signed)
Prescription sent. Requested pharmacy to cancel previous prescription. Thank you.

## 2017-09-01 ENCOUNTER — Other Ambulatory Visit: Payer: Self-pay | Admitting: Internal Medicine

## 2017-09-01 DIAGNOSIS — M5416 Radiculopathy, lumbar region: Secondary | ICD-10-CM

## 2017-09-05 ENCOUNTER — Encounter: Payer: Self-pay | Admitting: Internal Medicine

## 2017-09-05 DIAGNOSIS — F321 Major depressive disorder, single episode, moderate: Secondary | ICD-10-CM | POA: Diagnosis not present

## 2017-09-12 DIAGNOSIS — R351 Nocturia: Secondary | ICD-10-CM | POA: Diagnosis not present

## 2017-09-12 DIAGNOSIS — N401 Enlarged prostate with lower urinary tract symptoms: Secondary | ICD-10-CM | POA: Diagnosis not present

## 2017-09-13 ENCOUNTER — Encounter: Payer: Self-pay | Admitting: Internal Medicine

## 2017-09-19 DIAGNOSIS — R351 Nocturia: Secondary | ICD-10-CM | POA: Diagnosis not present

## 2017-09-19 DIAGNOSIS — N401 Enlarged prostate with lower urinary tract symptoms: Secondary | ICD-10-CM | POA: Diagnosis not present

## 2017-09-20 ENCOUNTER — Other Ambulatory Visit: Payer: Self-pay

## 2017-09-20 ENCOUNTER — Encounter: Payer: Self-pay | Admitting: Internal Medicine

## 2017-09-20 ENCOUNTER — Ambulatory Visit: Payer: Federal, State, Local not specified - PPO | Admitting: Internal Medicine

## 2017-09-20 VITALS — BP 123/86 | HR 92 | Temp 97.5°F | Ht 69.0 in | Wt 217.0 lb

## 2017-09-20 DIAGNOSIS — Z79899 Other long term (current) drug therapy: Secondary | ICD-10-CM | POA: Diagnosis not present

## 2017-09-20 DIAGNOSIS — I10 Essential (primary) hypertension: Secondary | ICD-10-CM

## 2017-09-20 DIAGNOSIS — M545 Low back pain: Secondary | ICD-10-CM

## 2017-09-20 DIAGNOSIS — G8929 Other chronic pain: Secondary | ICD-10-CM

## 2017-09-20 MED ORDER — HYDROCODONE-ACETAMINOPHEN 7.5-325 MG PO TABS: 1.0000 | ORAL_TABLET | Freq: Two times a day (BID) | ORAL | 0 refills | Status: DC | PRN

## 2017-09-20 NOTE — Patient Instructions (Signed)
Mr. Reuel BoomDaniel,   We will start Norco 7.5-325 mg for your back pain. You will take 1 tablet twice daily as needed for pain. I have sent the prescription to your pharmacy.   Please come back in 6 weeks before your prescription runs out or sooner if your need to be seen before then.   Continue taking your blood pressure medication as usual.   Please call us if you have any questions.   - Dr. Evelene CroonSantos

## 2017-09-20 NOTE — Assessment & Plan Note (Signed)
BP at goal on current regimen. Will continue amlodipine 10, atenolol 50 mg, benazepril 40, and HCTZ 25 mg.

## 2017-09-20 NOTE — Assessment & Plan Note (Signed)
Patient presents for follow up of CBP. States he was recently seen by his neurosurgeon Dr. Danielle DessElsner ~ 2 months ago at which time he was informed there was no need for further workup. He had a nuclear bone scan at that time that showed delayed phase activity at L4-L5 corresponding with known degenerative disease.  He presents today complaining of worsening of lower back pain, new left hip pain that radiates to his left knee, and RUE + LLE weakness after a long car ride 3-4 weeks ago. States he has had 2 falls in the past 3 days that have not resulted in acute injury. He also reports 2 episodes of bowel incontinence prior to sustaining falls.  He has been taking Cymbalta and gabapentin for chronic neuropathic pain since 01/2017. He initially had a good response, but he has stopped experiencing pain relief. Reports the only medication that has helped in the past was hydrocodone. His neurological exam is reassuring. Strength and sensation are intact. Very low suspicion of an acute neurological process at this time in the setting of neurological deficits.  Suspect this is an exacerbation of chronic back pain after recent long car ride. PEG score 10.    - Start hydrocodone-acetaminophen 7.5-325 mg BID PRN. Prescription for 60 tablets sent to pharmacy with 1 refill.  - Follow up with me in 6 weeks. Will plan to obtain UDS then.

## 2017-09-20 NOTE — Progress Notes (Signed)
CC: Back pain and HTN follow up   HPI:  [name] is a 62 y.o. male with PMH listed below who presents to clinic for chronic back pain and HTN follow up.   Chronic back pain: Patient presents for follow up of CBP. States he was recently seen by his neurosurgeon Dr. Danielle DessElsner ~ 2 months ago at which time he was informed there was no need for further workup. He had a nuclear bone scan at that time that showed delayed phase activity at L4-L5 corresponding with known degenerative disease.  He presents today complaining of worsening of lower back pain, new left hip pain that radiates to his left knee, and RUE + LLE weakness after a long car ride 3-4 weeks ago. States he has had 2 falls in the past 3 days that have not resulted in injury. He also reports 2 episodes of bowel incontinence prior to sustaining falls. He has been taking Cymbalta and gabapentin for chronic neuropathic pain since 01/2017. He initially had a good response, but he has stopped experiencing pain relief. Reports the only medication that has helped in the past was hydrocodone. His neurological exam is reassuring. Strength and sensation are intact. Very low suspicion of an acute neurological process at this time in the setting of neurological deficits.  Suspect this is an exacerbation of chronic back pain after recent long car ride. PEG score 10.  Will start hydrocodone-acetaminophen 7.5-325 mg 2 times daily as needed. Prescription for 60 tablets sent to pharmacy with 1 refill.Follow up in 6 weeks. Will plan to obtain UDS then.  HTN: BP at goal on current regimen. Will continue amlodipine 10, atenolol 50 mg, benazepril 40, and HCTZ 25 mg.    Past Medical History:  Diagnosis Date  . Anxiety   . Calculus, kidney 2000   Sees Dr. Isabel CapriceGrapey, urology.   . Cervical spondylosis 2004   sp surgery by Dr. Danielle DessElsner  . Depression   . Headache(784.0)    Chronic, on vicodin 5 TID for this.   . Hepatitis C    Has occasional visits with Dr.  Randa EvensEdwards GI, failed RX in 2000.  Liver Biopsy 9/06: Minimally active hepatitis consistent with hepatitis C, minimal necroinflamtory activity grade 1, no incrased fibrosis stage 0.   . Herpes labialis   . Hypertension   . Pneumonia   . Renal stone   . Spondylolisthesis of lumbar region   . Thalassemia    HGB 9/09 14.4 wtih MCV 72.6  . Wears glasses    Review of Systems:   Review of Systems  Constitutional: Negative for chills, fever, malaise/fatigue and weight loss.  Gastrointestinal: Negative for abdominal pain, nausea and vomiting.  Neurological: Positive for tingling, sensory change and focal weakness. Negative for dizziness.    Physical Exam:  Vitals:   09/20/17 1524  BP: 123/86  Pulse: 92  Temp: (!) 97.5 F (36.4 C)  TempSrc: Oral  SpO2: 98%  Weight: 217 lb (98.4 kg)  Height: 5\' 9"  (1.753 m)   Physical Exam  Constitutional: He is oriented to person, place, and time.  Well-nourished, well-developed male who appears younger than stated age sitting up in chair in no acute distress   Cardiovascular: Normal rate, regular rhythm and normal heart sounds. Exam reveals no gallop and no friction rub.  No murmur heard. Pulmonary/Chest: Effort normal and breath sounds normal. No respiratory distress. He has no wheezes. He has no rales.  Neurological: He is alert and oriented to person, place, and  time. No cranial nerve deficit.  Strength and sensation intact in all 4 extremities. No unsteadiness on ambulation.     Assessment & Plan:   See Encounters Tab for problem based charting.  Patient discussed with Dr. Oswaldo Done

## 2017-09-21 ENCOUNTER — Encounter: Payer: Self-pay | Admitting: Internal Medicine

## 2017-09-23 NOTE — Progress Notes (Signed)
Internal Medicine Clinic Attending  Case discussed with Dr. Lovenia KimSantos-Sanchez  at the time of the visit.  We reviewed the resident's history and exam and pertinent patient test results.  I agree with the assessment, diagnosis, and plan of care documented in the resident's note.  To be clear, this is a patient with chronic OA-related lower back pain with worsening functional status recently. Medical therapy so far has not improved functioning and there are no surgical options. Our goal in initiating low dose immediate release opioids is to improve his functional state. Dr. Lovenia KimSantos-Sanchez provided a two month supply as a trial, and we will see how he responds to it in close follow up.

## 2017-10-04 ENCOUNTER — Encounter: Payer: Self-pay | Admitting: Internal Medicine

## 2017-10-07 ENCOUNTER — Other Ambulatory Visit: Payer: Self-pay | Admitting: Internal Medicine

## 2017-10-07 DIAGNOSIS — M5416 Radiculopathy, lumbar region: Secondary | ICD-10-CM | POA: Diagnosis not present

## 2017-10-07 DIAGNOSIS — G959 Disease of spinal cord, unspecified: Secondary | ICD-10-CM | POA: Diagnosis not present

## 2017-10-07 MED ORDER — GABAPENTIN 300 MG PO CAPS: 900.0000 mg | ORAL_CAPSULE | Freq: Three times a day (TID) | ORAL | 0 refills | Status: DC

## 2017-10-15 DIAGNOSIS — M545 Low back pain: Secondary | ICD-10-CM | POA: Diagnosis not present

## 2017-10-15 DIAGNOSIS — M542 Cervicalgia: Secondary | ICD-10-CM | POA: Diagnosis not present

## 2017-10-16 DIAGNOSIS — G959 Disease of spinal cord, unspecified: Secondary | ICD-10-CM | POA: Diagnosis not present

## 2017-10-28 ENCOUNTER — Other Ambulatory Visit: Payer: Self-pay | Admitting: Sports Medicine

## 2017-10-31 ENCOUNTER — Other Ambulatory Visit: Payer: Self-pay | Admitting: Internal Medicine

## 2017-10-31 DIAGNOSIS — I10 Essential (primary) hypertension: Secondary | ICD-10-CM

## 2017-10-31 NOTE — Telephone Encounter (Signed)
Next appt scheduled 7/26 with PCP. 

## 2017-11-01 ENCOUNTER — Encounter: Payer: Federal, State, Local not specified - PPO | Admitting: Internal Medicine

## 2017-11-01 DIAGNOSIS — M5030 Other cervical disc degeneration, unspecified cervical region: Secondary | ICD-10-CM | POA: Insufficient documentation

## 2017-11-01 DIAGNOSIS — Z01818 Encounter for other preprocedural examination: Secondary | ICD-10-CM | POA: Insufficient documentation

## 2017-11-01 DIAGNOSIS — Z87891 Personal history of nicotine dependence: Secondary | ICD-10-CM | POA: Diagnosis not present

## 2017-11-01 DIAGNOSIS — Z7289 Other problems related to lifestyle: Secondary | ICD-10-CM | POA: Diagnosis not present

## 2017-11-01 DIAGNOSIS — Z8709 Personal history of other diseases of the respiratory system: Secondary | ICD-10-CM | POA: Diagnosis not present

## 2017-11-04 ENCOUNTER — Encounter (HOSPITAL_COMMUNITY): Payer: Self-pay | Admitting: *Deleted

## 2017-11-04 ENCOUNTER — Other Ambulatory Visit: Payer: Self-pay

## 2017-11-04 ENCOUNTER — Emergency Department (HOSPITAL_COMMUNITY)
Admission: EM | Admit: 2017-11-04 | Discharge: 2017-11-04 | Disposition: A | Discharge status: Home / Self Care | Payer: Federal, State, Local not specified - PPO | Attending: Emergency Medicine | Admitting: Emergency Medicine

## 2017-11-04 DIAGNOSIS — Z79899 Other long term (current) drug therapy: Secondary | ICD-10-CM | POA: Diagnosis not present

## 2017-11-04 DIAGNOSIS — Z87891 Personal history of nicotine dependence: Secondary | ICD-10-CM | POA: Diagnosis not present

## 2017-11-04 DIAGNOSIS — I10 Essential (primary) hypertension: Secondary | ICD-10-CM | POA: Insufficient documentation

## 2017-11-04 DIAGNOSIS — R338 Other retention of urine: Secondary | ICD-10-CM | POA: Insufficient documentation

## 2017-11-04 DIAGNOSIS — R339 Retention of urine, unspecified: Secondary | ICD-10-CM | POA: Diagnosis not present

## 2017-11-04 LAB — URINALYSIS, ROUTINE W REFLEX MICROSCOPIC
Bacteria, UA: NONE SEEN
Bilirubin Urine: NEGATIVE
Glucose, UA: NEGATIVE mg/dL
Hgb urine dipstick: NEGATIVE
Ketones, ur: NEGATIVE mg/dL
Nitrite: NEGATIVE
Protein, ur: NEGATIVE mg/dL
Specific Gravity, Urine: 1.012 (ref 1.005–1.030)
pH: 5 (ref 5.0–8.0)

## 2017-11-04 LAB — BASIC METABOLIC PANEL WITH GFR
Anion gap: 10 (ref 5–15)
BUN: 32 mg/dL — ABNORMAL HIGH (ref 8–23)
CO2: 28 mmol/L (ref 22–32)
Calcium: 9.2 mg/dL (ref 8.9–10.3)
Chloride: 100 mmol/L (ref 98–111)
Creatinine, Ser: 1.59 mg/dL — ABNORMAL HIGH (ref 0.61–1.24)
GFR calc Af Amer: 52 mL/min — ABNORMAL LOW
GFR calc non Af Amer: 45 mL/min — ABNORMAL LOW
Glucose, Bld: 109 mg/dL — ABNORMAL HIGH (ref 70–99)
Potassium: 3.8 mmol/L (ref 3.5–5.1)
Sodium: 138 mmol/L (ref 135–145)

## 2017-11-04 LAB — CBC
HCT: 36.1 % — ABNORMAL LOW (ref 39.0–52.0)
Hemoglobin: 11.6 g/dL — ABNORMAL LOW (ref 13.0–17.0)
MCH: 22.7 pg — ABNORMAL LOW (ref 26.0–34.0)
MCHC: 32.1 g/dL (ref 30.0–36.0)
MCV: 70.5 fL — ABNORMAL LOW (ref 78.0–100.0)
Platelets: 236 K/uL (ref 150–400)
RBC: 5.12 MIL/uL (ref 4.22–5.81)
RDW: 15.1 % (ref 11.5–15.5)
WBC: 8.6 K/uL (ref 4.0–10.5)

## 2017-11-04 MED ORDER — SODIUM CHLORIDE 0.9 % IV BOLUS
500.0000 mL | Freq: Once | INTRAVENOUS | Status: AC
  Administered 2017-11-04: 500 mL via INTRAVENOUS

## 2017-11-04 MED ORDER — LIDOCAINE HCL (PF) 1 % IJ SOLN
INTRAMUSCULAR | Status: AC
  Filled 2017-11-04: qty 30

## 2017-11-04 NOTE — ED Provider Notes (Signed)
[facility]-EMERGENCY DEPT Provider Note   CSN: [id number] Arrival date & time: 11/04/17  1659     History   Chief Complaint Chief Complaint  Patient presents with  . Urinary Retention    HPI Greg Flowers is a 62 y.o. male who presents to the ED for urinary rentention. He has a pmh of BPH with frequency and hesitancy.  Patient was recently placed on muscle relaxers and pain medication for cervical disc disease and he is scheduled to have a surgery coming shortly in the next week or so.  Patient denies fevers, chills, back pain. HPI  Past Medical History:  Diagnosis Date  . Anxiety   . Calculus, kidney 2000   Sees Dr. Isabel Caprice, urology.   . Cervical spondylosis 2004   sp surgery by Dr. Danielle Dess  . Depression   . Headache(784.0)    Chronic, on vicodin 5 TID for this.   . Hepatitis C    Has occasional visits with Dr. Randa Evens GI, failed RX in 2000.  Liver Biopsy 9/06: Minimally active hepatitis consistent with hepatitis C, minimal necroinflamtory activity grade 1, no incrased fibrosis stage 0.   . Herpes labialis   . Hypertension   . Pneumonia   . Renal stone   . Spondylolisthesis of lumbar region   . Thalassemia    HGB 9/09 14.4 wtih MCV 72.6  . Wears glasses     Patient Active Problem List   Diagnosis Date Noted  . Liver fibrosis 04/30/2017  . Bloating symptom 04/30/2017  . Back pain of thoracolumbar region 10/18/2016  . Thoracic radiculopathy 09/12/2016  . Spondylolisthesis at L4-L5 level 02/13/2016  . NSAID long-term use 01/09/2016  . Chronic back pain 01/09/2016  . Osteoarthritis of both knees 11/02/2011  . NECK AND BACK PAIN 11/03/2009  . FOOT PAIN, BILATERAL 09/20/2009  . DISORDER, DEPRESSIVE NEC 09/25/2006  . HERPES LABIALIS 04/04/2006  . THALASSEMIA NEC 04/04/2006  . Chronic hepatitis C without hepatic coma (HCC) 02/22/2006  . Essential hypertension 02/22/2006  . SPONDYLOSIS, CERVICAL 02/22/2006    Past Surgical History:  Procedure  Laterality Date  . CERVICAL DISCECTOMY  2004   Dr Danielle Dess  . COLONOSCOPY    . LITHOTRIPSY  2004   Dr Logan Bores  . TONSILLECTOMY          Home Medications    Prior to Admission medications   Medication Sig Start Date End Date Taking? Authorizing Provider  amLODipine (NORVASC) 10 MG tablet TAKE 1 TABLET BY MOUTH EVERY DAY 10/31/17  Yes Santos-Sanchez, Chelsea Primus, MD  atenolol (TENORMIN) 50 MG tablet TAKE 1 TABLET (50 MG TOTAL) BY MOUTH DAILY. 04/26/17  Yes Santos-Sanchez, Chelsea Primus, MD  benazepril (LOTENSIN) 40 MG tablet TAKE 2 TABLETS (80 MG TOTAL) BY MOUTH DAILY. 10/31/17  Yes Santos-Sanchez, Chelsea Primus, MD  DULoxetine (CYMBALTA) 60 MG capsule Take 60 mg by mouth 2 (two) times daily. 11/03/17  Yes [provider]  FLUoxetine (PROZAC) 20 MG tablet Take 60 mg by mouth daily. 04/13/17  Yes [provider]  gabapentin (NEURONTIN) 300 MG capsule Take 3 capsules (900 mg total) by mouth 3 (three) times daily. 10/07/17 10/07/18 Yes Santos-Sanchez, Chelsea Primus, MD  hydrochlorothiazide (HYDRODIURIL) 25 MG tablet TAKE 1 TABLET (25 MG TOTAL) BY MOUTH DAILY. 04/26/17  Yes [name], MD  HYDROcodone-acetaminophen (NORCO) 7.5-325 MG tablet Take 1 tablet by mouth 2 (two) times daily as needed for moderate pain. 09/20/17  Yes [name], MD  Multiple Vitamins-Minerals ([employer] SPECTRAVITE ULTRA MEN 50+ PO) Take 1  tablet by mouth daily.   Yes [provider]  omeprazole (PRILOSEC) 20 MG capsule Take 40 mg by mouth 2 (two) times daily before a meal.   Yes [provider]  sodium chloride (OCEAN) 0.65 % SOLN nasal spray Place 2 sprays into both nostrils as needed for congestion.   Yes [provider]  Tamsulosin HCl (FLOMAX) 0.4 MG CAPS Take 0.4 mg by mouth at bedtime.     Yes [provider]  Tetrahydrozoline HCl (VISINE OP) Place 2 drops into both eyes as needed (for dry eyes).   Yes [provider]  HYDROcodone-acetaminophen (NORCO) 7.5-325 MG tablet  Take 1 tablet by mouth 2 (two) times daily as needed for moderate pain. Patient not taking: Reported on 11/04/2017 09/20/17   Burna CashSantos-Sanchez, Idalys, MD    Family History Family History  Problem Relation Age of Onset  . Hypertension Mother   . Hypertension Father     Social History Social History   Tobacco Use  . Smoking status: Former Games developermoker  . Smokeless tobacco: Never Used  . Tobacco comment: quit in the early 1980's  Substance Use Topics  . Alcohol use: Yes    Comment: Occasionally  . Drug use: No     Allergies   Patient has no known allergies.   Review of Systems Review of Systems Ten systems reviewed and are negative for acute change, except as noted in the HPI.    Physical Exam Updated Vital Signs BP 125/90   Pulse 86   Temp 97.6 F (36.4 C)   Resp 20   Ht 5\' 8"  (1.727 m)   Wt 97.5 kg (215 lb)   SpO2 95%   BMI 32.69 kg/m   Physical Exam  Constitutional: He appears well-developed and well-nourished. No distress.  HENT:  Head: Normocephalic and atraumatic.  Eyes: Conjunctivae are normal. No scleral icterus.  Neck: Normal range of motion. Neck supple.  Cardiovascular: Normal rate, regular rhythm and normal heart sounds.  Pulmonary/Chest: Effort normal and breath sounds normal. No respiratory distress.  Abdominal: Soft. There is no tenderness.  Musculoskeletal: He exhibits no edema.  Neurological: He is alert.  Skin: Skin is warm and dry. He is not diaphoretic.  Psychiatric: His behavior is normal.  Nursing note and vitals reviewed.    ED Treatments / Results  Labs (all labs ordered are listed, but only abnormal results are displayed) Labs Reviewed  URINALYSIS, ROUTINE W REFLEX MICROSCOPIC - Abnormal; Notable for the following components:      Result Value   Leukocytes, UA TRACE (*)    All other components within normal limits  BASIC METABOLIC PANEL - Abnormal; Notable for the following components:   Glucose, Bld 109 (*)    BUN 32 (*)     Creatinine, Ser 1.59 (*)    GFR calc non Af Amer 45 (*)    GFR calc Af Amer 52 (*)    All other components within normal limits  CBC - Abnormal; Notable for the following components:   Hemoglobin 11.6 (*)    HCT 36.1 (*)    MCV 70.5 (*)    MCH 22.7 (*)    All other components within normal limits    EKG None  Radiology No results found.  Procedures Procedures (including critical care time)  Medications Ordered in ED Medications  sodium chloride 0.9 % bolus 500 mL (0 mLs Intravenous Stopped 11/04/17 2301)     Initial Impression / Assessment and Plan / ED Course  I  have reviewed the triage vital signs and the nursing notes.  Pertinent labs & imaging results that were available during my care of the patient were reviewed by me and considered in my medical decision making (see chart for details).   Patient with acute urinary retention.  He has a slight bump in his creatinine and BUN which I think is very likely some mild postobstructive uropathy.  Patient currently with Foley catheter and leg bag.  His urine does not appear infected.  Patient will be discharged to follow-up closely with urology and appears appropriate for discharge at this time discussed return precautions  Final Clinical Impressions(s) / ED Diagnoses   Final diagnoses:  Acute urinary retention    ED Discharge Orders    None       Arthor Captain, PA-C 11/05/17 Curly [place], MD 11/06/17 [phone]

## 2017-11-04 NOTE — Discharge Instructions (Addendum)
Contact a health care provider if: °You develop a low-grade fever. °You experience spasms or leakage of urine with the spasms. °Get help right away if: °You develop chills or fever. °Your catheter stops draining urine. °Your catheter falls out. °You start to develop increased bleeding that does not respond to rest and increased fluid intake. °

## 2017-11-04 NOTE — ED Triage Notes (Signed)
Pt states [place] the [place]. Pain in flanks. Has a cough that is productive.

## 2017-11-06 ENCOUNTER — Encounter: Payer: Self-pay | Admitting: Internal Medicine

## 2017-11-08 ENCOUNTER — Encounter: Payer: Self-pay | Admitting: Internal Medicine

## 2017-11-08 ENCOUNTER — Other Ambulatory Visit: Payer: Self-pay

## 2017-11-08 ENCOUNTER — Ambulatory Visit (INDEPENDENT_AMBULATORY_CARE_PROVIDER_SITE_OTHER): Payer: Federal, State, Local not specified - PPO | Admitting: Internal Medicine

## 2017-11-08 VITALS — BP 139/92 | HR 90 | Temp 97.6°F | Ht 69.0 in | Wt 221.0 lb

## 2017-11-08 DIAGNOSIS — N401 Enlarged prostate with lower urinary tract symptoms: Secondary | ICD-10-CM | POA: Diagnosis not present

## 2017-11-08 DIAGNOSIS — G8929 Other chronic pain: Secondary | ICD-10-CM | POA: Diagnosis not present

## 2017-11-08 DIAGNOSIS — R339 Retention of urine, unspecified: Secondary | ICD-10-CM | POA: Diagnosis not present

## 2017-11-08 DIAGNOSIS — Z960 Presence of urogenital implants: Secondary | ICD-10-CM

## 2017-11-08 DIAGNOSIS — R338 Other retention of urine: Secondary | ICD-10-CM

## 2017-11-08 DIAGNOSIS — Z981 Arthrodesis status: Secondary | ICD-10-CM

## 2017-11-08 DIAGNOSIS — R2689 Other abnormalities of gait and mobility: Secondary | ICD-10-CM

## 2017-11-08 DIAGNOSIS — Z79899 Other long term (current) drug therapy: Secondary | ICD-10-CM

## 2017-11-08 DIAGNOSIS — M545 Low back pain: Secondary | ICD-10-CM

## 2017-11-08 DIAGNOSIS — Z79891 Long term (current) use of opiate analgesic: Secondary | ICD-10-CM

## 2017-11-08 DIAGNOSIS — M479 Spondylosis, unspecified: Secondary | ICD-10-CM | POA: Diagnosis not present

## 2017-11-08 MED ORDER — HYDROCODONE-ACETAMINOPHEN 7.5-325 MG PO TABS: 1.0000 | ORAL_TABLET | Freq: Two times a day (BID) | ORAL | 0 refills | Status: DC | PRN

## 2017-11-08 NOTE — Patient Instructions (Signed)
Mr. [name],   I will refill your Norco at the current dose for 2 months ago.   I will follow up with your urine test.   I also checked your kidney function today. I can email you the results.   Please make sure to follow up with your urologist.   Good luck with your surgery!   Please call us if you have any questions or concerns.   - Dr. Evelene CroonSantos

## 2017-11-08 NOTE — Progress Notes (Signed)
CC: Chronic back pain follow up and urinary retention   HPI:  [name] is a 62 y.o. male with PMH listed below who presents to clinic for follow up of chronic back pain.   Chronic back pain: This is secondary to osteoarthritis. I last evaluated him on 09/20/2017 at which time he reported worsening functional status and he was started on opiate therapy with Norco 7.5-325 mg BID PRN. He had tried NSAIDs, Cymbalta, and gabapentin with no improvement in pain. He had also been evaluated by neurosurgery with no surgical options for his pain. He reports the pain medication worked well the first 2-3 weeks, but now provides little relief. He has been taking it 3-4x per day lately and ran out of it early. PEG score 10, unchanged from previous visit. He has been using Robaxin and Flexeril prescribed by a spinal surgeon who evaluated him 3 weeks ago with plan for surgical intervention in 2 weeks (revision of cervical fusion and new surgical fusion above prior one). I have advised him to stop both of these medications due to new urinary retention and refilled Norco 7.5-325 mg BID PRN (60 tablets and 1 refill). UDS obtained today, will follow. He is to follow up with me in 2 months.   ADDENDUM: I reviewed [place] controlled substance database. Patient was prescribed Norco 5 (50 tablets) on 7/3 by Dr. Turner Daniels (orthopedics) and has another prescription for Norco 5 (60 tablets) on 7/19 by Dr. Yevette Edwards (orthopedics). Called pharmacy to cancel 2nd prescription for Norco that patient was supposed to fill on 8/25. Will have patient follow up in 2 months at which time will discuss pain contract and repeat  UDS. Of note, he has been on a pain contract in our clinic before which was voided when he a urine test positive for cocaine.   Urinary retention: Patient has a history of BPH and takes Flomax that is prescribed by his urologist. He was prescribed Robaxin and Flexeril by his orthopedic surgeon 3 weeks ago for chronic back  pain. He noticed difficulty voiding and abdominal 5 days ago and went to ED where he was found to have urinary retention requiring placement of Foley catheter. His creatinine was also elevated at 1.5. His renal function is normal at baseline. He has the Foley in place today and has a follow up appt with Urology on 8/1 for further evaluation. In the mean time, I have advised to stop both Robaxin and Flexeril. Follow up BMP to check renal function.    Past Medical History:  Diagnosis Date  . Anxiety   . Calculus, kidney 2000   Sees Dr. Isabel Caprice, urology.   . Cervical spondylosis 2004   sp surgery by Dr. Danielle Dess  . Depression   . Headache(784.0)    Chronic, on vicodin 5 TID for this.   . Hepatitis C    Has occasional visits with Dr. Randa Evens GI, failed RX in 2000.  Liver Biopsy 9/06: Minimally active hepatitis consistent with hepatitis C, minimal necroinflamtory activity grade 1, no incrased fibrosis stage 0.   . Herpes labialis   . Hypertension   . Pneumonia   . Renal stone   . Spondylolisthesis of lumbar region   . Thalassemia    HGB 9/09 14.4 wtih MCV 72.6  . Wears glasses    Review of Systems:   Review of Systems  Constitutional: Positive for malaise/fatigue. Negative for chills and fever.  Gastrointestinal: Positive for abdominal pain. Negative for constipation, diarrhea, nausea and vomiting.  Neurological: Positive for tingling, sensory change, focal weakness and weakness.    Physical Exam:  Vitals:   11/08/17 1456  BP: (!) 139/92  Pulse: 90  Temp: 97.6 F (36.4 C)  TempSrc: Oral  SpO2: 96%  Weight: 221 lb (100.2 kg)  Height: 5\' 9"  (1.753 m)   Physical Exam  Constitutional: He is oriented to person, place, and time.  Chronically ill-appearing male who appears uncomfortable due to pain but is no acute distress   Cardiovascular: Normal rate, regular rhythm and normal heart sounds. Exam reveals no gallop and no friction rub.  No murmur heard. Pulmonary/Chest: Effort normal  and breath sounds normal. No respiratory distress. He has no wheezes. He has no rales.  Neurological: He is alert and oriented to person, place, and time. He has intact cranial nerves. He has a normal Cerebellar Exam. Gait (Leans forward and uses cane to ambulate) abnormal.  LUE and RUE 4/5 in strength, grip with decreased strength bilaterally, LE 5/5, sensation decreased over bilateral UE.      Assessment & Plan:   See Encounters Tab for problem based charting.  Patient discussed with Dr. Oswaldo DoneVincent

## 2017-11-08 NOTE — Assessment & Plan Note (Addendum)
Chronic back pain: This is secondary to osteoarthritis. I last evaluated him on 09/20/2017 at which time he reported worsening functional status and he was started on opiate therapy with Norco 7.5-325 mg BID PRN. He had tried NSAIDs, Cymbalta, and gabapentin with no improvement in pain. He had also been evaluated by neurosurgery with no surgical options for his pain. He reports the pain medication worked well the first 2-3 weeks, but now provides little relief. He has been taking it 3-4x per day lately and ran out of it early. PEG score 10, unchanged from previous visit. He has been using Robaxin and Flexeril prescribed by a spinal surgeon who evaluated him 3 weeks ago with plan for surgical intervention in 2 weeks (revision of cervical fusion and new surgical fusion above prior one). I have advised him to stop both of these medications due to new urinary retention and refilled Norco 7.5-325 mg BID PRN (60 tablets and 1 refill). UDS obtained today, will follow. He is to follow up with me in 2 months.   ADDENDUM: I reviewed [place] controlled substance database 7/26. Patient was prescribed Norco 5 (50 tablets) on 7/3 by Dr. Turner Danielsowan (orthopedics) and has another prescription for Norco 5 (60 tablets) on 7/19 by Dr. Yevette Edwardsumonski (orthopedics). Called pharmacy to cancel 2nd prescription for Norco that patient was supposed to fill on 8/25. Will have patient follow up in 2 months at which time will discuss pain contract and repeat  UDS.  Of note, he has been on a pain contract in our clinic before which was voided when he a urine test positive for cocaine.

## 2017-11-08 NOTE — Assessment & Plan Note (Signed)
Urinary retention: Patient has a history of BPH and takes Flomax that is prescribed by his urologist. He was prescribed Robaxin and Flexeril by his orthopedic surgeon 3 weeks ago for chronic back pain. He noticed difficulty voiding and abdominal 5 days ago and went to ED where he was found to have urinary retention requiring placement of Foley catheter. His creatinine was also elevated at 1.5. His renal function is normal at baseline. He has the Foley in place today and has a follow up appt with Urology on 8/1 for further evaluation. In the mean time, I have advised to stop both Robaxin and Flexeril. Follow up BMP to check renal function.

## 2017-11-09 LAB — BMP8+ANION GAP
Anion Gap: 16 mmol/L (ref 10.0–18.0)
BUN/Creatinine Ratio: 13 (ref 10–24)
BUN: 16 mg/dL (ref 8–27)
CO2: 26 mmol/L (ref 20–29)
Calcium: 9.8 mg/dL (ref 8.6–10.2)
Chloride: 98 mmol/L (ref 96–106)
Creatinine, Ser: 1.21 mg/dL (ref 0.76–1.27)
GFR calc Af Amer: 73 mL/min/{1.73_m2} (ref >59)
GFR calc non Af Amer: 63 mL/min/{1.73_m2} (ref >59)
Glucose: 94 mg/dL (ref 65–99)
Potassium: 4.5 mmol/L (ref 3.5–5.2)
Sodium: 140 mmol/L (ref 134–144)

## 2017-11-11 NOTE — Progress Notes (Signed)
Internal Medicine Clinic Attending  Case discussed with Dr. Santos-Sanchez at the time of the visit.  We reviewed the resident's history and exam and pertinent patient test results.  I agree with the assessment, diagnosis, and plan of care documented in the resident's note.    

## 2017-11-13 ENCOUNTER — Other Ambulatory Visit: Payer: Self-pay | Admitting: Orthopedic Surgery

## 2017-11-14 ENCOUNTER — Encounter: Payer: Self-pay | Admitting: Internal Medicine

## 2017-11-14 DIAGNOSIS — R338 Other retention of urine: Secondary | ICD-10-CM | POA: Diagnosis not present

## 2017-11-15 ENCOUNTER — Encounter (HOSPITAL_COMMUNITY)
Admission: RE | Admit: 2017-11-15 | Discharge: 2017-11-15 | Disposition: A | Discharge status: Home / Self Care | Payer: Federal, State, Local not specified - PPO | Source: Ambulatory Visit | Attending: Orthopedic Surgery | Admitting: Orthopedic Surgery

## 2017-11-15 ENCOUNTER — Other Ambulatory Visit: Payer: Self-pay

## 2017-11-15 ENCOUNTER — Ambulatory Visit (HOSPITAL_COMMUNITY)
Admission: RE | Admit: 2017-11-15 | Discharge: 2017-11-15 | Disposition: A | Discharge status: Home / Self Care | Payer: Federal, State, Local not specified - PPO | Source: Ambulatory Visit | Attending: Orthopedic Surgery | Admitting: Orthopedic Surgery

## 2017-11-15 ENCOUNTER — Encounter (HOSPITAL_COMMUNITY): Payer: Self-pay

## 2017-11-15 DIAGNOSIS — Z01812 Encounter for preprocedural laboratory examination: Secondary | ICD-10-CM | POA: Diagnosis not present

## 2017-11-15 DIAGNOSIS — Z01818 Encounter for other preprocedural examination: Secondary | ICD-10-CM | POA: Diagnosis not present

## 2017-11-15 DIAGNOSIS — G959 Disease of spinal cord, unspecified: Secondary | ICD-10-CM | POA: Diagnosis not present

## 2017-11-15 DIAGNOSIS — Z01810 Encounter for preprocedural cardiovascular examination: Secondary | ICD-10-CM | POA: Diagnosis not present

## 2017-11-15 DIAGNOSIS — R0989 Other specified symptoms and signs involving the circulatory and respiratory systems: Secondary | ICD-10-CM | POA: Diagnosis not present

## 2017-11-15 HISTORY — DX: Personal history of urinary calculi: Z87.442

## 2017-11-15 HISTORY — DX: Gastro-esophageal reflux disease without esophagitis: K21.9

## 2017-11-15 LAB — TYPE AND SCREEN
ABO/RH(D): B POS
Antibody Screen: NEGATIVE

## 2017-11-15 LAB — CBC WITH DIFFERENTIAL/PLATELET
Abs Immature Granulocytes: 0 10*3/uL (ref 0.0–0.1)
Basophils Absolute: 0.1 10*3/uL (ref 0.0–0.1)
Basophils Relative: 1 %
Eosinophils Absolute: 0.6 10*3/uL (ref 0.0–0.7)
Eosinophils Relative: 6 %
HCT: 40.7 % (ref 39.0–52.0)
Hemoglobin: 12.3 g/dL — ABNORMAL LOW (ref 13.0–17.0)
Immature Granulocytes: 0 %
Lymphocytes Relative: 29 %
Lymphs Abs: 2.7 10*3/uL (ref 0.7–4.0)
MCH: 21.8 pg — ABNORMAL LOW (ref 26.0–34.0)
MCHC: 30.2 g/dL (ref 30.0–36.0)
MCV: 72 fL — ABNORMAL LOW (ref 78.0–100.0)
Monocytes Absolute: 0.8 10*3/uL (ref 0.1–1.0)
Monocytes Relative: 9 %
Neutro Abs: 5 10*3/uL (ref 1.7–7.7)
Neutrophils Relative %: 55 %
Platelets: 283 10*3/uL (ref 150–400)
RBC: 5.65 MIL/uL (ref 4.22–5.81)
RDW: 15.4 % (ref 11.5–15.5)
WBC: 9.1 10*3/uL (ref 4.0–10.5)

## 2017-11-15 LAB — COMPREHENSIVE METABOLIC PANEL
ALT: 15 U/L (ref 0–44)
AST: 22 U/L (ref 15–41)
Albumin: 3.8 g/dL (ref 3.5–5.0)
Alkaline Phosphatase: 61 U/L (ref 38–126)
Anion gap: 10 (ref 5–15)
BUN: 28 mg/dL — ABNORMAL HIGH (ref 8–23)
CO2: 25 mmol/L (ref 22–32)
Calcium: 9.1 mg/dL (ref 8.9–10.3)
Chloride: 103 mmol/L (ref 98–111)
Creatinine, Ser: 1.63 mg/dL — ABNORMAL HIGH (ref 0.61–1.24)
GFR calc Af Amer: 50 mL/min — ABNORMAL LOW (ref >60)
GFR calc non Af Amer: 43 mL/min — ABNORMAL LOW (ref >60)
Glucose, Bld: 113 mg/dL — ABNORMAL HIGH (ref 70–99)
Potassium: 4.5 mmol/L (ref 3.5–5.1)
Sodium: 138 mmol/L (ref 135–145)
Total Bilirubin: 0.6 mg/dL (ref 0.3–1.2)
Total Protein: 6.9 g/dL (ref 6.5–8.1)

## 2017-11-15 LAB — TOXASSURE SELECT,+ANTIDEPR,UR

## 2017-11-15 LAB — SURGICAL PCR SCREEN
MRSA, PCR: NEGATIVE
Staphylococcus aureus: POSITIVE — AB

## 2017-11-15 LAB — PROTIME-INR
INR: 1.02
Prothrombin Time: 13.3 seconds (ref 11.4–15.2)

## 2017-11-15 LAB — APTT: aPTT: 45 seconds — ABNORMAL HIGH (ref 24–36)

## 2017-11-15 NOTE — Progress Notes (Signed)
PCP: Dr. Lovenia KimSantos-Sanchez Cardiologist: Denies  EKG: Today CXR: Today ECHO: denies Stress Test: denies Cardiac Cath: denies  Patient denies shortness of breath, fever, cough, and chest pain at PAT appointment.  Patient verbalized understanding of instructions provided today at the PAT appointment.  Patient asked to review instructions at home and day of surgery.

## 2017-11-15 NOTE — Pre-Procedure Instructions (Signed)
Greg Flowers  11/15/2017     Your procedure is scheduled on November 21, 2017.  [facility] at 05:30 A.M.  Call this number if you have problems the morning of surgery:  [phone]   Remember:  Do not eat or drink after midnight.     Take these medicines the morning of surgery with A SIP OF WATER : Amlodipine (Novasc) Atenolol (Tenormin) Duloxetine (Cymbalta) Gabapentin (Neurontin) Omeprazole (Prilosec) Hydrocodone-acetaminophen (Norco)--if needed Nasal spray if needed Tamsulosin (Flomax)  7 days prior to surgery STOP taking any Aspirin (unless otherwise instructed by your surgeon), Aleve, Naproxen, Ibuprofen, Motrin, Advil, Goody's, BC's, all herbal medications, fish oil, and all vitamins.    Do not wear jewelry.  Do not wear lotions, powders, or cologne, or deodorant.  Do not shave 48 hours prior to surgery.  Men may shave face and neck.  Do not bring valuables to the hospital.  [facility] not [facility] for any belongings or valuables.  Contacts, dentures or bridgework may not be worn into surgery.  Leave your suitcase in the car.  After surgery it may be brought to your room.  For patients admitted to the hospital, discharge time will be determined by your treatment team.  Patients discharged the day of surgery will not be allowed to drive home.   Special instructions:   [place]- Preparing For Surgery  Before surgery, you can play an important role. Because skin is not sterile, your skin needs to be as free of germs as possible. You can reduce the number of germs on your skin by washing with CHG (chlorahexidine gluconate) Soap before surgery.  CHG is an antiseptic cleaner which kills germs and bonds with the skin to continue killing germs even after washing.    Oral Hygiene is also important to reduce your risk of infection.  Remember - BRUSH YOUR TEETH THE MORNING OF SURGERY WITH YOUR REGULAR TOOTHPASTE  Please do not use if  you have an allergy to CHG or antibacterial soaps. If your skin becomes reddened/irritated stop using the CHG.  Do not shave (including legs and underarms) for at least 48 hours prior to first CHG shower. It is OK to shave your face.  Please follow these instructions carefully.   1. Shower the NIGHT BEFORE SURGERY and the MORNING OF SURGERY with CHG.   2. If you chose to wash your hair, wash your hair first as usual with your normal shampoo.  3. After you shampoo, rinse your hair and body thoroughly to remove the shampoo.  4. Use CHG as you would any other liquid soap. You can apply CHG directly to the skin and wash gently with a scrungie or a clean washcloth.   5. Apply the CHG Soap to your body ONLY FROM THE NECK DOWN.  Do not use on open wounds or open sores. Avoid contact with your eyes, ears, mouth and genitals (private parts). Wash Face and genitals (private parts)  with your normal soap.  6. Wash thoroughly, paying special attention to the area where your surgery will be performed.  7. Thoroughly rinse your body with warm water from the neck down.  8. DO NOT shower/wash with your normal soap after using and rinsing off the CHG Soap.  9. Pat yourself dry with a CLEAN TOWEL.  10. Wear CLEAN PAJAMAS to bed the night before surgery, wear comfortable clothes the morning of surgery  11. Place CLEAN SHEETS on your bed the  night of your first shower and DO NOT SLEEP WITH PETS.    Day of Surgery:  Do not apply any deodorants/lotions.  Please wear clean clothes to the hospital/surgery center.   Remember to brush your teeth WITH YOUR REGULAR TOOTHPASTE.    Please read over the following fact sheets that you were given.

## 2017-11-15 NOTE — Progress Notes (Signed)
Pt unable to provide urine sample at PAT appt, pt rescheduled for 11/18/17 in PAT to provide sample.

## 2017-11-18 ENCOUNTER — Encounter (HOSPITAL_COMMUNITY)
Admission: RE | Admit: 2017-11-18 | Discharge: 2017-11-18 | Disposition: A | Discharge status: Home / Self Care | Payer: Federal, State, Local not specified - PPO | Source: Ambulatory Visit | Attending: Orthopedic Surgery | Admitting: Orthopedic Surgery

## 2017-11-18 DIAGNOSIS — Z01818 Encounter for other preprocedural examination: Secondary | ICD-10-CM | POA: Diagnosis not present

## 2017-11-18 DIAGNOSIS — M79652 Pain in left thigh: Secondary | ICD-10-CM | POA: Insufficient documentation

## 2017-11-18 LAB — URINALYSIS, ROUTINE W REFLEX MICROSCOPIC
Bilirubin Urine: NEGATIVE
Glucose, UA: NEGATIVE mg/dL
Hgb urine dipstick: NEGATIVE
Ketones, ur: NEGATIVE mg/dL
Leukocytes, UA: NEGATIVE
Nitrite: NEGATIVE
Protein, ur: NEGATIVE mg/dL
Specific Gravity, Urine: 1.011 (ref 1.005–1.030)
pH: 7 (ref 5.0–8.0)

## 2017-11-20 NOTE — Anesthesia Preprocedure Evaluation (Addendum)
Anesthesia Evaluation  Patient identified by MRN, date of birth, ID band Patient awake    Reviewed: Allergy & Precautions, NPO status , Patient's Chart, lab work & pertinent test results, reviewed documented beta blocker date and time   Airway Mallampati: II  TM Distance: >3 FB Neck ROM: Limited    Dental no notable dental hx. (+) Teeth Intact, Dental Advisory Given   Pulmonary former smoker,    Pulmonary exam normal breath sounds clear to auscultation       Cardiovascular hypertension, Pt. on home beta blockers and Pt. on medications Normal cardiovascular exam Rhythm:Regular Rate:Normal     Neuro/Psych  Headaches, Weakness in R arm and numbness in L arm. Also numbness in feet  Neuromuscular disease    GI/Hepatic GERD  ,  Endo/Other    Renal/GU Renal InsufficiencyRenal disease     Musculoskeletal  (+) Arthritis ,   Abdominal (+) + obese,   Peds  Hematology  (+) anemia ,   Anesthesia Other Findings   Reproductive/Obstetrics                            Lab Results  Component Value Date   CREATININE 1.63 (H) 11/15/2017   BUN 28 (H) 11/15/2017   NA 138 11/15/2017   K 4.5 11/15/2017   CL 103 11/15/2017   CO2 25 11/15/2017    Lab Results  Component Value Date   WBC 9.1 11/15/2017   HGB 12.3 (L) 11/15/2017   HCT 40.7 11/15/2017   MCV 72.0 (L) 11/15/2017   PLT 283 11/15/2017    Anesthesia Physical Anesthesia Plan  ASA: III  Anesthesia Plan: General   Post-op Pain Management:    Induction: Intravenous  PONV Risk Score and Plan: 2 and Treatment may vary due to age or medical condition, Ondansetron and Dexamethasone  Airway Management Planned: Oral ETT and Video Laryngoscope Planned  Additional Equipment:   Intra-op Plan:   Post-operative Plan: Extubation in OR  Informed Consent: I have reviewed the patients History and Physical, chart, labs and discussed the procedure  including the risks, benefits and alternatives for the proposed anesthesia with the patient or authorized representative who has indicated his/her understanding and acceptance.   Dental advisory given  Plan Discussed with: CRNA  Anesthesia Plan Comments:        Anesthesia Quick Evaluation

## 2017-11-21 ENCOUNTER — Ambulatory Visit (HOSPITAL_COMMUNITY): Payer: Federal, State, Local not specified - PPO | Admitting: Anesthesiology

## 2017-11-21 ENCOUNTER — Encounter (HOSPITAL_COMMUNITY): Payer: Self-pay

## 2017-11-21 ENCOUNTER — Ambulatory Visit (HOSPITAL_COMMUNITY)
Admission: RE | Disposition: A | Discharge status: Home / Self Care | Payer: Self-pay | Source: Ambulatory Visit | Attending: Orthopedic Surgery

## 2017-11-21 ENCOUNTER — Ambulatory Visit (HOSPITAL_COMMUNITY): Payer: Federal, State, Local not specified - PPO

## 2017-11-21 ENCOUNTER — Observation Stay (HOSPITAL_COMMUNITY)
Admission: RE | Admit: 2017-11-21 | Discharge: 2017-11-22 | Disposition: A | Discharge status: Home / Self Care | Payer: Federal, State, Local not specified - PPO | Source: Ambulatory Visit | Attending: Orthopedic Surgery | Admitting: Orthopedic Surgery

## 2017-11-21 ENCOUNTER — Other Ambulatory Visit: Payer: Self-pay

## 2017-11-21 DIAGNOSIS — G959 Disease of spinal cord, unspecified: Secondary | ICD-10-CM | POA: Diagnosis not present

## 2017-11-21 DIAGNOSIS — G9520 Unspecified cord compression: Secondary | ICD-10-CM | POA: Insufficient documentation

## 2017-11-21 DIAGNOSIS — M50021 Cervical disc disorder at C4-C5 level with myelopathy: Principal | ICD-10-CM | POA: Insufficient documentation

## 2017-11-21 DIAGNOSIS — B182 Chronic viral hepatitis C: Secondary | ICD-10-CM | POA: Diagnosis not present

## 2017-11-21 DIAGNOSIS — Z87891 Personal history of nicotine dependence: Secondary | ICD-10-CM | POA: Diagnosis not present

## 2017-11-21 DIAGNOSIS — Z9889 Other specified postprocedural states: Secondary | ICD-10-CM | POA: Diagnosis not present

## 2017-11-21 DIAGNOSIS — I10 Essential (primary) hypertension: Secondary | ICD-10-CM | POA: Diagnosis not present

## 2017-11-21 DIAGNOSIS — F329 Major depressive disorder, single episode, unspecified: Secondary | ICD-10-CM | POA: Diagnosis not present

## 2017-11-21 DIAGNOSIS — M5002 Cervical disc disorder with myelopathy, mid-cervical region, unspecified level: Secondary | ICD-10-CM | POA: Diagnosis not present

## 2017-11-21 DIAGNOSIS — G9529 Other cord compression: Secondary | ICD-10-CM | POA: Diagnosis not present

## 2017-11-21 DIAGNOSIS — Z87442 Personal history of urinary calculi: Secondary | ICD-10-CM | POA: Insufficient documentation

## 2017-11-21 DIAGNOSIS — Z79899 Other long term (current) drug therapy: Secondary | ICD-10-CM | POA: Insufficient documentation

## 2017-11-21 DIAGNOSIS — B1920 Unspecified viral hepatitis C without hepatic coma: Secondary | ICD-10-CM | POA: Insufficient documentation

## 2017-11-21 DIAGNOSIS — Z419 Encounter for procedure for purposes other than remedying health state, unspecified: Secondary | ICD-10-CM

## 2017-11-21 DIAGNOSIS — Z981 Arthrodesis status: Secondary | ICD-10-CM | POA: Insufficient documentation

## 2017-11-21 DIAGNOSIS — G709 Myoneural disorder, unspecified: Secondary | ICD-10-CM | POA: Diagnosis not present

## 2017-11-21 DIAGNOSIS — F419 Anxiety disorder, unspecified: Secondary | ICD-10-CM | POA: Diagnosis not present

## 2017-11-21 DIAGNOSIS — M4802 Spinal stenosis, cervical region: Secondary | ICD-10-CM | POA: Diagnosis not present

## 2017-11-21 DIAGNOSIS — K219 Gastro-esophageal reflux disease without esophagitis: Secondary | ICD-10-CM | POA: Insufficient documentation

## 2017-11-21 DIAGNOSIS — M4322 Fusion of spine, cervical region: Secondary | ICD-10-CM | POA: Diagnosis not present

## 2017-11-21 HISTORY — PX: ANTERIOR CERVICAL DECOMP/DISCECTOMY FUSION: SHX1161

## 2017-11-21 SURGERY — ANTERIOR CERVICAL DECOMPRESSION/DISCECTOMY FUSION 2 LEVELS
Anesthesia: General | Site: Spine Cervical

## 2017-11-21 MED ORDER — SALINE SPRAY 0.65 % NA SOLN: 2.0000 | NASAL | Status: DC | PRN

## 2017-11-21 MED ORDER — SENNOSIDES-DOCUSATE SODIUM 8.6-50 MG PO TABS
1.0000 | ORAL_TABLET | Freq: Every evening | ORAL | Status: DC | PRN
Start: 2017-11-21 — End: 2017-11-22

## 2017-11-21 MED ORDER — PHENYLEPHRINE 40 MCG/ML (10ML) SYRINGE FOR IV PUSH (FOR BLOOD PRESSURE SUPPORT)
PREFILLED_SYRINGE | INTRAVENOUS | Status: AC
  Filled 2017-11-21: qty 10

## 2017-11-21 MED ORDER — THROMBIN 20000 UNITS EX SOLR
CUTANEOUS | Status: DC | PRN
  Administered 2017-11-21: 20000 [IU] via TOPICAL

## 2017-11-21 MED ORDER — HYDROCHLOROTHIAZIDE 25 MG PO TABS
25.0000 mg | ORAL_TABLET | Freq: Every day | ORAL | Status: DC
  Administered 2017-11-21 – 2017-11-22 (×2): 25 mg via ORAL
  Filled 2017-11-21: qty 1

## 2017-11-21 MED ORDER — ONDANSETRON HCL 4 MG PO TABS: 4.0000 mg | ORAL_TABLET | Freq: Four times a day (QID) | ORAL | Status: DC | PRN

## 2017-11-21 MED ORDER — EPHEDRINE 5 MG/ML INJ
INTRAVENOUS | Status: AC
  Filled 2017-11-21: qty 10

## 2017-11-21 MED ORDER — GABAPENTIN 600 MG PO TABS
600.0000 mg | ORAL_TABLET | Freq: Three times a day (TID) | ORAL | Status: DC
  Administered 2017-11-21 – 2017-11-22 (×3): 600 mg via ORAL
  Filled 2017-11-21 (×3): qty 1

## 2017-11-21 MED ORDER — B COMPLEX PO TABS: 1.0000 | ORAL_TABLET | Freq: Every day | ORAL | Status: DC

## 2017-11-21 MED ORDER — SODIUM CHLORIDE 0.9% FLUSH: 3.0000 mL | Freq: Two times a day (BID) | INTRAVENOUS | Status: DC

## 2017-11-21 MED ORDER — PROPOFOL 10 MG/ML IV BOLUS
INTRAVENOUS | Status: DC | PRN
  Administered 2017-11-21: 170 mg via INTRAVENOUS

## 2017-11-21 MED ORDER — FENTANYL CITRATE (PF) 250 MCG/5ML IJ SOLN
INTRAMUSCULAR | Status: DC | PRN
  Administered 2017-11-21: 100 ug via INTRAVENOUS
  Administered 2017-11-21 (×3): 50 ug via INTRAVENOUS

## 2017-11-21 MED ORDER — CEFAZOLIN SODIUM-DEXTROSE 2-4 GM/100ML-% IV SOLN
2.0000 g | INTRAVENOUS | Status: AC
  Administered 2017-11-21: 2 g via INTRAVENOUS

## 2017-11-21 MED ORDER — HYDROMORPHONE HCL 1 MG/ML IJ SOLN
0.2500 mg | INTRAMUSCULAR | Status: DC | PRN
  Administered 2017-11-21 (×2): 0.5 mg via INTRAVENOUS

## 2017-11-21 MED ORDER — ACETAMINOPHEN 500 MG PO TABS
ORAL_TABLET | ORAL | Status: AC
  Administered 2017-11-21: 1000 mg via ORAL
  Filled 2017-11-21: qty 2

## 2017-11-21 MED ORDER — ADULT MULTIVITAMIN W/MINERALS CH
1.0000 | ORAL_TABLET | Freq: Every day | ORAL | Status: DC
  Administered 2017-11-22: 1 via ORAL
  Filled 2017-11-21: qty 1

## 2017-11-21 MED ORDER — MIDAZOLAM HCL 2 MG/2ML IJ SOLN
INTRAMUSCULAR | Status: DC | PRN
  Administered 2017-11-21: 2 mg via INTRAVENOUS

## 2017-11-21 MED ORDER — DIAZEPAM 5 MG PO TABS
5.0000 mg | ORAL_TABLET | Freq: Four times a day (QID) | ORAL | Status: DC | PRN
  Administered 2017-11-21 – 2017-11-22 (×4): 5 mg via ORAL
  Filled 2017-11-21 (×4): qty 1

## 2017-11-21 MED ORDER — OXYCODONE-ACETAMINOPHEN 5-325 MG PO TABS
1.0000 | ORAL_TABLET | ORAL | Status: DC | PRN
  Administered 2017-11-21 – 2017-11-22 (×6): 2 via ORAL
  Filled 2017-11-21 (×6): qty 2

## 2017-11-21 MED ORDER — POVIDONE-IODINE 7.5 % EX SOLN
Freq: Once | CUTANEOUS | Status: DC
  Filled 2017-11-21: qty 118

## 2017-11-21 MED ORDER — GABAPENTIN 300 MG PO CAPS
ORAL_CAPSULE | ORAL | Status: AC
  Filled 2017-11-21: qty 1

## 2017-11-21 MED ORDER — KETAMINE HCL 10 MG/ML IJ SOLN
INTRAMUSCULAR | Status: DC | PRN
  Administered 2017-11-21 (×5): 10 mg via INTRAVENOUS

## 2017-11-21 MED ORDER — ACETAMINOPHEN 10 MG/ML IV SOLN: 1000.0000 mg | Freq: Once | INTRAVENOUS | Status: DC | PRN

## 2017-11-21 MED ORDER — MIDAZOLAM HCL 2 MG/2ML IJ SOLN
INTRAMUSCULAR | Status: AC
  Filled 2017-11-21: qty 2

## 2017-11-21 MED ORDER — PANTOPRAZOLE SODIUM 40 MG PO TBEC
40.0000 mg | DELAYED_RELEASE_TABLET | Freq: Every day | ORAL | Status: DC
  Administered 2017-11-22: 40 mg via ORAL
  Filled 2017-11-21: qty 1

## 2017-11-21 MED ORDER — HYDROMORPHONE HCL 1 MG/ML IJ SOLN
INTRAMUSCULAR | Status: AC
  Filled 2017-11-21: qty 1

## 2017-11-21 MED ORDER — DOCUSATE SODIUM 100 MG PO CAPS
100.0000 mg | ORAL_CAPSULE | Freq: Two times a day (BID) | ORAL | Status: DC
  Administered 2017-11-21 – 2017-11-22 (×2): 100 mg via ORAL
  Filled 2017-11-21 (×2): qty 1

## 2017-11-21 MED ORDER — ROCURONIUM BROMIDE 10 MG/ML (PF) SYRINGE
PREFILLED_SYRINGE | INTRAVENOUS | Status: AC
  Filled 2017-11-21: qty 10

## 2017-11-21 MED ORDER — GLYCOPYRROLATE PF 0.2 MG/ML IJ SOSY
PREFILLED_SYRINGE | INTRAMUSCULAR | Status: DC | PRN
  Administered 2017-11-21: .2 mg via INTRAVENOUS

## 2017-11-21 MED ORDER — KETAMINE HCL 50 MG/5ML IJ SOSY
PREFILLED_SYRINGE | INTRAMUSCULAR | Status: AC
  Filled 2017-11-21: qty 5

## 2017-11-21 MED ORDER — DULOXETINE HCL 30 MG PO CPEP
120.0000 mg | ORAL_CAPSULE | Freq: Every day | ORAL | Status: DC
  Administered 2017-11-22: 120 mg via ORAL
  Filled 2017-11-21: qty 4

## 2017-11-21 MED ORDER — SODIUM CHLORIDE 0.9% FLUSH: 3.0000 mL | INTRAVENOUS | Status: DC | PRN

## 2017-11-21 MED ORDER — TETRAHYDROZOLINE HCL 0.05 % OP SOLN: 1.0000 [drp] | Freq: Two times a day (BID) | OPHTHALMIC | Status: DC | PRN

## 2017-11-21 MED ORDER — MENTHOL 3 MG MT LOZG
1.0000 | LOZENGE | OROMUCOSAL | Status: DC | PRN
  Administered 2017-11-22: 3 mg via ORAL
  Filled 2017-11-21: qty 9

## 2017-11-21 MED ORDER — ATENOLOL 25 MG PO TABS
50.0000 mg | ORAL_TABLET | Freq: Every day | ORAL | Status: DC
  Administered 2017-11-22: 50 mg via ORAL
  Filled 2017-11-21: qty 2

## 2017-11-21 MED ORDER — EPHEDRINE SULFATE-NACL 50-0.9 MG/10ML-% IV SOSY
PREFILLED_SYRINGE | INTRAVENOUS | Status: DC | PRN
  Administered 2017-11-21: 30 mg via INTRAVENOUS
  Administered 2017-11-21 (×2): 10 mg via INTRAVENOUS

## 2017-11-21 MED ORDER — THROMBIN (RECOMBINANT) 20000 UNITS EX SOLR
CUTANEOUS | Status: AC
  Filled 2017-11-21: qty 20000

## 2017-11-21 MED ORDER — FENTANYL CITRATE (PF) 250 MCG/5ML IJ SOLN
INTRAMUSCULAR | Status: AC
  Filled 2017-11-21: qty 5

## 2017-11-21 MED ORDER — HYDROCODONE-ACETAMINOPHEN 7.5-325 MG PO TABS: 1.0000 | ORAL_TABLET | Freq: Once | ORAL | Status: DC | PRN

## 2017-11-21 MED ORDER — TAMSULOSIN HCL 0.4 MG PO CAPS
0.8000 mg | ORAL_CAPSULE | Freq: Every day | ORAL | Status: DC
  Administered 2017-11-22: 0.8 mg via ORAL
  Filled 2017-11-21: qty 2

## 2017-11-21 MED ORDER — HYDROMORPHONE HCL 1 MG/ML IJ SOLN
INTRAMUSCULAR | Status: DC | PRN
  Administered 2017-11-21 (×2): 0.5 mg via INTRAVENOUS

## 2017-11-21 MED ORDER — SODIUM CHLORIDE 0.9 % IV SOLN
INTRAVENOUS | Status: DC | PRN
  Administered 2017-11-21: 100 ug/min via INTRAVENOUS

## 2017-11-21 MED ORDER — BISACODYL 5 MG PO TBEC: 5.0000 mg | DELAYED_RELEASE_TABLET | Freq: Every day | ORAL | Status: DC | PRN

## 2017-11-21 MED ORDER — CEFAZOLIN SODIUM-DEXTROSE 2-4 GM/100ML-% IV SOLN
2.0000 g | Freq: Three times a day (TID) | INTRAVENOUS | Status: AC
  Administered 2017-11-21 – 2017-11-22 (×2): 2 g via INTRAVENOUS
  Filled 2017-11-21 (×2): qty 100

## 2017-11-21 MED ORDER — PHENOL 1.4 % MT LIQD
1.0000 | OROMUCOSAL | Status: DC | PRN
  Administered 2017-11-22: 1 via OROMUCOSAL
  Filled 2017-11-21: qty 177

## 2017-11-21 MED ORDER — ONDANSETRON HCL 4 MG/2ML IJ SOLN
INTRAMUSCULAR | Status: DC | PRN
  Administered 2017-11-21: 4 mg via INTRAVENOUS

## 2017-11-21 MED ORDER — ZOLPIDEM TARTRATE 5 MG PO TABS
10.0000 mg | ORAL_TABLET | Freq: Every evening | ORAL | Status: DC | PRN
  Administered 2017-11-22: 10 mg via ORAL
  Filled 2017-11-21: qty 2

## 2017-11-21 MED ORDER — ACETAMINOPHEN 500 MG PO TABS
1000.0000 mg | ORAL_TABLET | Freq: Once | ORAL | Status: AC
  Administered 2017-11-21: 1000 mg via ORAL

## 2017-11-21 MED ORDER — PHENYLEPHRINE 40 MCG/ML (10ML) SYRINGE FOR IV PUSH (FOR BLOOD PRESSURE SUPPORT)
PREFILLED_SYRINGE | INTRAVENOUS | Status: DC | PRN
  Administered 2017-11-21: 120 ug via INTRAVENOUS
  Administered 2017-11-21: 160 ug via INTRAVENOUS
  Administered 2017-11-21: 120 ug via INTRAVENOUS

## 2017-11-21 MED ORDER — HYDROMORPHONE HCL 1 MG/ML IJ SOLN
INTRAMUSCULAR | Status: AC
  Filled 2017-11-21: qty 0.5

## 2017-11-21 MED ORDER — 0.9 % SODIUM CHLORIDE (POUR BTL) OPTIME
TOPICAL | Status: DC | PRN
  Administered 2017-11-21 (×2): 1000 mL

## 2017-11-21 MED ORDER — PROPOFOL 10 MG/ML IV BOLUS
INTRAVENOUS | Status: AC
  Filled 2017-11-21: qty 20

## 2017-11-21 MED ORDER — ONDANSETRON HCL 4 MG/2ML IJ SOLN
INTRAMUSCULAR | Status: AC
  Filled 2017-11-21: qty 2

## 2017-11-21 MED ORDER — BUPIVACAINE-EPINEPHRINE (PF) 0.25% -1:200000 IJ SOLN
INTRAMUSCULAR | Status: AC
  Filled 2017-11-21: qty 30

## 2017-11-21 MED ORDER — ZOLPIDEM TARTRATE 5 MG PO TABS: 5.0000 mg | ORAL_TABLET | Freq: Every evening | ORAL | Status: DC | PRN

## 2017-11-21 MED ORDER — LACTATED RINGERS IV SOLN
INTRAVENOUS | Status: DC | PRN
  Administered 2017-11-21 (×2): via INTRAVENOUS

## 2017-11-21 MED ORDER — CEFAZOLIN SODIUM-DEXTROSE 2-4 GM/100ML-% IV SOLN
INTRAVENOUS | Status: AC
  Filled 2017-11-21: qty 100

## 2017-11-21 MED ORDER — ONDANSETRON HCL 4 MG/2ML IJ SOLN: 4.0000 mg | Freq: Four times a day (QID) | INTRAMUSCULAR | Status: DC | PRN

## 2017-11-21 MED ORDER — MEPERIDINE HCL 50 MG/ML IJ SOLN: 6.2500 mg | INTRAMUSCULAR | Status: DC | PRN

## 2017-11-21 MED ORDER — GLYCOPYRROLATE PF 0.2 MG/ML IJ SOSY
PREFILLED_SYRINGE | INTRAMUSCULAR | Status: AC
  Filled 2017-11-21: qty 1

## 2017-11-21 MED ORDER — VITAMIN C 500 MG PO TABS
2000.0000 mg | ORAL_TABLET | Freq: Every day | ORAL | Status: DC
  Administered 2017-11-22: 2000 mg via ORAL
  Filled 2017-11-21: qty 4

## 2017-11-21 MED ORDER — BUPIVACAINE-EPINEPHRINE 0.25% -1:200000 IJ SOLN
INTRAMUSCULAR | Status: DC | PRN
  Administered 2017-11-21: 30 mL

## 2017-11-21 MED ORDER — ALUM & MAG HYDROXIDE-SIMETH 200-200-20 MG/5ML PO SUSP: 30.0000 mL | Freq: Four times a day (QID) | ORAL | Status: DC | PRN

## 2017-11-21 MED ORDER — SODIUM CHLORIDE 0.9 % IV SOLN
250.0000 mL | INTRAVENOUS | Status: DC
  Administered 2017-11-21: 250 mL via INTRAVENOUS

## 2017-11-21 MED ORDER — GABAPENTIN 300 MG PO CAPS: 300.0000 mg | ORAL_CAPSULE | Freq: Once | ORAL | Status: DC

## 2017-11-21 MED ORDER — FLEET ENEMA 7-19 GM/118ML RE ENEM: 1.0000 | ENEMA | Freq: Once | RECTAL | Status: DC | PRN

## 2017-11-21 MED ORDER — ROCURONIUM BROMIDE 10 MG/ML (PF) SYRINGE
PREFILLED_SYRINGE | INTRAVENOUS | Status: DC | PRN
  Administered 2017-11-21: 50 mg via INTRAVENOUS
  Administered 2017-11-21 (×2): 20 mg via INTRAVENOUS
  Administered 2017-11-21: 10 mg via INTRAVENOUS

## 2017-11-21 MED ORDER — AMLODIPINE BESYLATE 5 MG PO TABS
10.0000 mg | ORAL_TABLET | Freq: Every day | ORAL | Status: DC
  Administered 2017-11-22: 10 mg via ORAL
  Filled 2017-11-21: qty 2

## 2017-11-21 MED ORDER — SUGAMMADEX SODIUM 200 MG/2ML IV SOLN
INTRAVENOUS | Status: DC | PRN
  Administered 2017-11-21: 200 mg via INTRAVENOUS

## 2017-11-21 MED ORDER — ACETAMINOPHEN 650 MG RE SUPP: 650.0000 mg | RECTAL | Status: DC | PRN

## 2017-11-21 MED ORDER — LIDOCAINE 2% (20 MG/ML) 5 ML SYRINGE
INTRAMUSCULAR | Status: DC | PRN
  Administered 2017-11-21: 100 mg via INTRAVENOUS

## 2017-11-21 MED ORDER — PROMETHAZINE HCL 25 MG/ML IJ SOLN: 6.2500 mg | INTRAMUSCULAR | Status: DC | PRN

## 2017-11-21 MED ORDER — ACETAMINOPHEN 325 MG PO TABS
650.0000 mg | ORAL_TABLET | ORAL | Status: DC | PRN
Start: 2017-11-21 — End: 2017-11-22

## 2017-11-21 MED ORDER — BENAZEPRIL HCL 40 MG PO TABS
80.0000 mg | ORAL_TABLET | Freq: Every day | ORAL | Status: DC
  Administered 2017-11-21 – 2017-11-22 (×2): 80 mg via ORAL
  Filled 2017-11-21 (×2): qty 2

## 2017-11-21 SURGICAL SUPPLY — 90 items
ADH SKN CLS APL DERMABOND .7 (GAUZE/BANDAGES/DRESSINGS) ×1
APL SKNCLS STERI-STRIP NONHPOA (GAUZE/BANDAGES/DRESSINGS) ×1
BENZOIN TINCTURE PRP APPL 2/3 (GAUZE/BANDAGES/DRESSINGS) ×2 IMPLANT
BIT DRILL NEURO 2X3.1 SFT TUCH (MISCELLANEOUS) ×1 IMPLANT
BIT DRILL SRG 14X2.2XFLT CHK (BIT) IMPLANT
BIT DRL SRG 14X2.2XFLT CHK (BIT) ×1
BLADE CLIPPER SURG (BLADE) ×2 IMPLANT
BLADE SURG 15 STRL LF DISP TIS (BLADE) ×1 IMPLANT
BLADE SURG 15 STRL SS (BLADE) ×2
BONE VIVIGEN FORMABLE 1.3CC (Bone Implant) ×2 IMPLANT
BUR MATCHSTICK NEURO 3.0 LAGG (BURR) IMPLANT
CARTRIDGE OIL MAESTRO DRILL (MISCELLANEOUS) ×1 IMPLANT
COLLAR CERV LO CONTOUR FIRM DE (SOFTGOODS) IMPLANT
CORDS BIPOLAR (ELECTRODE) ×1 IMPLANT
COVER SURGICAL LIGHT HANDLE (MISCELLANEOUS) ×1 IMPLANT
CRADLE DONUT ADULT HEAD (MISCELLANEOUS) ×2 IMPLANT
DECANTER SPIKE VIAL GLASS SM (MISCELLANEOUS) ×2 IMPLANT
DERMABOND ADVANCED (GAUZE/BANDAGES/DRESSINGS) ×1
DERMABOND ADVANCED .7 DNX12 (GAUZE/BANDAGES/DRESSINGS) IMPLANT
DIFFUSER DRILL AIR PNEUMATIC (MISCELLANEOUS) ×2 IMPLANT
DRAIN JACKSON RD 7FR 3/32 (WOUND CARE) IMPLANT
DRAPE C-ARM 42X72 X-RAY (DRAPES) ×2 IMPLANT
DRAPE MICROSCOPE LEICA (MISCELLANEOUS) ×1 IMPLANT
DRAPE POUCH INSTRU U-SHP 10X18 (DRAPES) ×2 IMPLANT
DRAPE SURG 17X23 STRL (DRAPES) ×8 IMPLANT
DRILL BIT SKYLINE 14MM (BIT) ×2
DRILL NEURO 2X3.1 SOFT TOUCH (MISCELLANEOUS) ×2
DRSG OPSITE POSTOP 4X6 (GAUZE/BANDAGES/DRESSINGS) ×1 IMPLANT
DURAPREP 26ML APPLICATOR (WOUND CARE) ×3 IMPLANT
ELECT COATED BLADE 2.86 ST (ELECTRODE) ×2 IMPLANT
ELECT REM PT RETURN 9FT ADLT (ELECTROSURGICAL) ×2
ELECTRODE REM PT RTRN 9FT ADLT (ELECTROSURGICAL) ×1 IMPLANT
EVACUATOR SILICONE 100CC (DRAIN) IMPLANT
GAUZE 4X4 16PLY RFD (DISPOSABLE) ×1 IMPLANT
GAUZE SPONGE 4X4 12PLY STRL (GAUZE/BANDAGES/DRESSINGS) ×2 IMPLANT
GAUZE SPONGE 4X4 12PLY STRL LF (GAUZE/BANDAGES/DRESSINGS) ×1 IMPLANT
GAUZE SPONGE 4X4 16PLY XRAY LF (GAUZE/BANDAGES/DRESSINGS) ×2 IMPLANT
GLOVE BIO SURGEON STRL SZ7 (GLOVE) ×3 IMPLANT
GLOVE BIO SURGEON STRL SZ8 (GLOVE) ×4 IMPLANT
GLOVE BIOGEL PI IND STRL 7.0 (GLOVE) ×2 IMPLANT
GLOVE BIOGEL PI IND STRL 8 (GLOVE) ×1 IMPLANT
GLOVE BIOGEL PI INDICATOR 7.0 (GLOVE) ×2
GLOVE BIOGEL PI INDICATOR 8 (GLOVE) ×6
GLOVE ECLIPSE 8.0 STRL XLNG CF (GLOVE) ×4 IMPLANT
GOWN STRL REUS W/ TWL LRG LVL3 (GOWN DISPOSABLE) ×1 IMPLANT
GOWN STRL REUS W/ TWL XL LVL3 (GOWN DISPOSABLE) ×1 IMPLANT
GOWN STRL REUS W/TWL LRG LVL3 (GOWN DISPOSABLE) ×2
GOWN STRL REUS W/TWL XL LVL3 (GOWN DISPOSABLE) ×6
GRAFT BNE MATRIX VG FRMBL SM 1 (Bone Implant) IMPLANT
INTERLOCK LRDTC CRVCL VBR 7MM (Bone Implant) IMPLANT
IV CATH 14GX2 1/4 (CATHETERS) ×2 IMPLANT
IV CATH AUTO 14GX1.75 SAFE ORG (IV SOLUTION) ×1 IMPLANT
KIT BASIN OR (CUSTOM PROCEDURE TRAY) ×2 IMPLANT
KIT TURNOVER KIT B (KITS) ×2 IMPLANT
LORDOTIC CERVICAL VBR 7MM SM (Bone Implant) ×4 IMPLANT
MANIFOLD NEPTUNE II (INSTRUMENTS) ×1 IMPLANT
NDL PRECISIONGLIDE 27X1.5 (NEEDLE) ×1 IMPLANT
NDL SPNL 20GX3.5 QUINCKE YW (NEEDLE) ×1 IMPLANT
NEEDLE PRECISIONGLIDE 27X1.5 (NEEDLE) ×2 IMPLANT
NEEDLE SPNL 20GX3.5 QUINCKE YW (NEEDLE) ×2 IMPLANT
NS IRRIG 1000ML POUR BTL (IV SOLUTION) ×2 IMPLANT
OIL CARTRIDGE MAESTRO DRILL (MISCELLANEOUS) ×2
PACK ORTHO CERVICAL (CUSTOM PROCEDURE TRAY) ×2 IMPLANT
PAD ARMBOARD 7.5X6 YLW CONV (MISCELLANEOUS) ×4 IMPLANT
PATTIES SURGICAL .5 X.5 (GAUZE/BANDAGES/DRESSINGS) ×1 IMPLANT
PATTIES SURGICAL .5 X1 (DISPOSABLE) ×2 IMPLANT
PIN DISTRACTION 14 (PIN) ×2 IMPLANT
PLATE SKYLINE TWO LEVEL 32MM (Plate) ×1 IMPLANT
SCREW SKYLINE VAR OS 14MM (Screw) ×6 IMPLANT
SPONGE INTESTINAL PEANUT (DISPOSABLE) ×4 IMPLANT
SPONGE SURGIFOAM ABS GEL 100 (HEMOSTASIS) ×2 IMPLANT
STRIP CLOSURE SKIN 1/2X4 (GAUZE/BANDAGES/DRESSINGS) ×2 IMPLANT
SURGIFLO W/THROMBIN 8M KIT (HEMOSTASIS) IMPLANT
SUT BONE WAX W31G (SUTURE) ×1 IMPLANT
SUT MNCRL AB 4-0 PS2 18 (SUTURE) ×2 IMPLANT
SUT SILK 4 0 (SUTURE) ×2
SUT SILK 4-0 18XBRD TIE 12 (SUTURE) IMPLANT
SUT VIC AB 2-0 CT2 18 VCP726D (SUTURE) ×3 IMPLANT
SYR 20ML ECCENTRIC (SYRINGE) ×1 IMPLANT
SYR BULB 3OZ (MISCELLANEOUS) ×1 IMPLANT
SYR BULB IRRIGATION 50ML (SYRINGE) ×2 IMPLANT
SYR CONTROL 10ML LL (SYRINGE) ×6 IMPLANT
SYRINGE 20CC LL (MISCELLANEOUS) ×1 IMPLANT
TAPE CLOTH 4X10 WHT NS (GAUZE/BANDAGES/DRESSINGS) ×2 IMPLANT
TAPE CLOTH SURG 4X10 WHT LF (GAUZE/BANDAGES/DRESSINGS) ×1 IMPLANT
TAPE UMBILICAL COTTON 1/8X30 (MISCELLANEOUS) ×2 IMPLANT
TOWEL OR 17X24 6PK STRL BLUE (TOWEL DISPOSABLE) ×2 IMPLANT
TOWEL OR 17X26 10 PK STRL BLUE (TOWEL DISPOSABLE) ×2 IMPLANT
WATER STERILE IRR 1000ML POUR (IV SOLUTION) ×2 IMPLANT
YANKAUER SUCT BULB TIP NO VENT (SUCTIONS) ×2 IMPLANT

## 2017-11-21 NOTE — Anesthesia Postprocedure Evaluation (Signed)
Anesthesia Post Note  Patient: Greg Flowers  Procedure(s) Performed: ANTERIOR CERVICAL DECOMPRESSION FUSION, CERVICAL THREE-FOUR, CERVICAL FOUR-FIVE WITH INSTRUMENTATION AND ALLOGRAFT (N/A Spine Cervical)     Patient location during evaluation: PACU Anesthesia Type: General Level of consciousness: awake and alert Pain management: pain level controlled Vital Signs Assessment: post-procedure vital signs reviewed and stable Respiratory status: spontaneous breathing, nonlabored ventilation, respiratory function stable and patient connected to nasal cannula oxygen Cardiovascular status: blood pressure returned to baseline and stable Postop Assessment: no apparent nausea or vomiting Anesthetic complications: no    Last Vitals:  Vitals:   11/21/17 1240 11/21/17 1300  BP: 119/80 126/90  Pulse: 85 88  Resp: 18 17  Temp: 36.7 C 36.9 C  SpO2: 94% 95%    Last Pain:  Vitals:   11/21/17 1300  TempSrc: Oral  PainSc:                  Trevor IhaStephen A Havier Deeb

## 2017-11-21 NOTE — H&P (Addendum)
PREOPERATIVE H&P  Chief Complaint: Balance deterioration  HPI: Greg Flowers is a 62 y.o. male who presents with ongoing deterioration in balance  MRI reveals spinal cord compression at C3/4 and C4/5  Patient has failed multiple forms of conservative care and continues to have pain (see office notes for additional details regarding the patient's full course of treatment)  Past Medical History:  Diagnosis Date  . Anxiety   . Calculus, kidney 2000   Sees Dr. Isabel Caprice, urology.   . Cervical spondylosis 2004   sp surgery by Dr. Danielle Dess  . Depression   . GERD (gastroesophageal reflux disease)   . Headache(784.0)    Chronic, on vicodin 5 TID for this.   . Hepatitis C    Has occasional visits with Dr. Randa Evens GI, failed RX in 2000.  Liver Biopsy 9/06: Minimally active hepatitis consistent with hepatitis C, minimal necroinflamtory activity grade 1, no incrased fibrosis stage 0.   . Herpes labialis   . History of kidney stones   . Hypertension   . Pneumonia   . Renal stone   . Spondylolisthesis of lumbar region   . Thalassemia    HGB 9/09 14.4 wtih MCV 72.6  . Wears glasses    Past Surgical History:  Procedure Laterality Date  . BACK SURGERY  2017   L4-L5 PLATE/SCREWS  . CERVICAL DISCECTOMY  2004   Dr Danielle Dess  . COLONOSCOPY    . LITHOTRIPSY  2004   Dr Logan Bores  . NASAL SEPTUM SURGERY  2007  . TONSILLECTOMY     Social History   Socioeconomic History  . Marital status: Married    Spouse name: Not on file  . Number of children: Not on file  . Years of education: Not on file  . Highest education level: Not on file  Occupational History  . Not on file  Social Needs  . Financial resource strain: Not on file  . Food insecurity:    Worry: Not on file    Inability: Not on file  . Transportation needs:    Medical: Not on file    Non-medical: Not on file  Tobacco Use  . Smoking status: Former Smoker    Last attempt to quit: 04/16/1984    Years since quitting: 33.6  .  Smokeless tobacco: Never Used  . Tobacco comment: quit in the early 1980's  Substance and Sexual Activity  . Alcohol use: Yes    Comment: Occasionally 2-3 glasses wine/week  . Drug use: No  . Sexual activity: Yes    Partners: Female  Lifestyle  . Physical activity:    Days per week: Not on file    Minutes per session: Not on file  . Stress: Not on file  Relationships  . Social connections:    Talks on phone: Not on file    Gets together: Not on file    Attends religious service: Not on file    Active member of club or organization: Not on file    Attends meetings of clubs or organizations: Not on file    Relationship status: Not on file  Other Topics Concern  . Not on file  Social History Narrative   Works at [employer], Married, Exercises.    Family History  Problem Relation Age of Onset  . Hypertension Mother   . Hypertension Father    No Known Allergies Prior to Admission medications   Medication Sig Start Date End Date Taking? Authorizing Provider  amLODipine (NORVASC)  10 MG tablet TAKE 1 TABLET BY MOUTH EVERY DAY 10/31/17  Yes Santos-Sanchez, Chelsea PrimusIdalys, MD  Ascorbic Acid (VITAMIN C) 1000 MG tablet Take 2,000 mg by mouth daily.   Yes [provider]  atenolol (TENORMIN) 50 MG tablet TAKE 1 TABLET (50 MG TOTAL) BY MOUTH DAILY. 04/26/17  Yes Santos-Sanchez, Chelsea PrimusIdalys, MD  b complex vitamins tablet Take 1 tablet by mouth daily.   Yes [provider]  benazepril (LOTENSIN) 40 MG tablet TAKE 2 TABLETS (80 MG TOTAL) BY MOUTH DAILY. 10/31/17  Yes Santos-Sanchez, Chelsea PrimusIdalys, MD  DULoxetine (CYMBALTA) 60 MG capsule Take 120 mg by mouth daily.  11/03/17  Yes [provider]  gabapentin (NEURONTIN) 600 MG tablet Take 600 mg by mouth 3 (three) times daily.   Yes [provider]  hydrochlorothiazide (HYDRODIURIL) 25 MG tablet TAKE 1 TABLET (25 MG TOTAL) BY MOUTH DAILY. 04/26/17  Yes Burna CashSantos-Sanchez, Idalys, MD  HYDROcodone-acetaminophen (NORCO) 7.5-325 MG tablet  Take 1 tablet by mouth 2 (two) times daily as needed for moderate pain. 11/08/17  Yes Santos-Sanchez, Chelsea PrimusIdalys, MD  Multiple Vitamin (MULTIVITAMIN WITH MINERALS) TABS tablet Take 1 tablet by mouth daily.   Yes [provider]  omeprazole (PRILOSEC) 20 MG capsule Take 20 mg by mouth 2 (two) times daily.    Yes [provider]  Oxymetazoline HCl (VICKS SINEX 12 HOUR NA) Place 1 spray into the nose 2 (two) times daily as needed (for congestion.).   Yes [provider]  sodium chloride (OCEAN) 0.65 % SOLN nasal spray Place 2 sprays into both nostrils as needed for congestion.   Yes [provider]  Tamsulosin HCl (FLOMAX) 0.4 MG CAPS Take 0.8 mg by mouth daily.    Yes [provider]  Tetrahydrozoline HCl (VISINE OP) Place 2 drops into both eyes as needed (for dry eyes).   Yes [provider]  zolpidem (AMBIEN) 10 MG tablet Take 10 mg by mouth at bedtime as needed for sleep.   Yes [provider]     All other systems have been reviewed and were otherwise negative with the exception of those mentioned in the HPI and as above.  Physical Exam: There were no vitals filed for this visit.  There is no height or weight on file to calculate BMI.  General: Alert, no acute distress Cardiovascular: No pedal edema Respiratory: No cyanosis, no use of accessory musculature Skin: No lesions in the area of chief complaint Neurologic: Sensation intact distally Psychiatric: Patient is competent for consent with normal mood and affect Lymphatic: No axillary or cervical lymphadenopathy  MUSCULOSKELETAL: + HOFFMAN'S BILATERALLY  Assessment/Plan: CERVICAL MYELOPATHY Plan for Procedure(s): ANTERIOR CERVICAL DECOMPRESSION FUSION, CERVICAL 3-4, CERVICAL 4-5 WITH INSTRUMENTATION AND ALLOGRAFT   Emilee HeroUMONSKI,Dwayna Kentner LEONARD, MD 11/21/2017 6:22 AM

## 2017-11-21 NOTE — Anesthesia Procedure Notes (Signed)
Procedure Name: Intubation Date/Time: 11/21/2017 7:50 AM Performed by: Izola Priceockfield, Shante Archambeault Walton Jr., CRNA Pre-anesthesia Checklist: Patient identified, Emergency Drugs available, Suction available and Patient being monitored Patient Re-evaluated:Patient Re-evaluated prior to induction Oxygen Delivery Method: Circle system utilized Preoxygenation: Pre-oxygenation with 100% oxygen Induction Type: IV induction Ventilation: Mask ventilation without difficulty Laryngoscope Size: Glidescope and 3 Grade View: Grade I Tube type: Oral Tube size: 7.5 mm Number of attempts: 1 Airway Equipment and Method: Stylet and Video-laryngoscopy Placement Confirmation: ETT inserted through vocal cords under direct vision,  positive ETCO2 and breath sounds checked- equal and bilateral Secured at: 23 cm Tube secured with: Tape Dental Injury: Teeth and Oropharynx as per pre-operative assessment  Comments: Head and neck maintained in neutral alignment.

## 2017-11-21 NOTE — Op Note (Signed)
NAME:  Greg Flowers                MEDICAL RECORD NO.:  [id number]  PHYSICIAN:  Greg Bamberg, MD      DATE OF BIRTH:  06/12/1955  DATE OF PROCEDURE:  11/21/2017                              OPERATIVE REPORT   PREOPERATIVE DIAGNOSES: 1. Cervical myelopathy 2. Spinal cord compression, C3/4, C4/5 3. S/p previous C5-7 ACDF  POSTOPERATIVE DIAGNOSES: 1. Cervical myelopathy 2. Spinal cord compression, C3/4, C4/5 3. S/p previous C5-7 ACDF  PROCEDURE: 1. Anterior cervical decompression and fusion C3/4, C4/5 2. Placement of anterior instrumentation, C3-C5. 3. Insertion of interbody device x 2 (Titan intervertebral spacers). 4. Intraoperative use of fluoroscopy. 5. Use of morselized allograft - ViviGen. 6. Removal of previously-placed instrumentation, C5-C7  SURGEON:  Greg Bamberg, MD  ASSISTANT:  Greg Coop, Greg Flowers.  ANESTHESIA:  General endotracheal anesthesia.  COMPLICATIONS:  None.  DISPOSITION:  Stable.  ESTIMATED BLOOD LOSS:  Minimal.  INDICATIONS FOR SURGERY:  Briefly, Greg Flowers is a pleasant 62 year old patient, who did present to me with progressive cervical myelopathy. The patient's  MRI did reveal the findings noted above.  Given the patient's ongoing rather debilitating pain and lack of improvement with appropriate treatment measures, we did discuss proceeding with the procedure noted above.  The patient was fully aware of the risks and limitations of surgery as outlined in my preoperative note.  OPERATIVE DETAILS:  On 11/21/2017, the patient was brought to surgery and general endotracheal anesthesia was administered.  The patient was placed supine on the hospital bed. The neck was gently extended.  All bony prominences were meticulously padded.  The neck was prepped and draped in the usual sterile fashion.  At this point, I did make a right-sided oblique incision.  The platysma was incised.  A Smith-Robinson approach was used and the anterior  spine was identified. The hardware spanning C5-C7 was identified and removed. A self-retaining retractor was placed.  I then subperiosteally exposed the vertebral bodies from C3-C5.  Caspar pins were then placed into the C3 and C4 vertebral bodies and distraction was applied.  A thorough and complete C3/4 intervertebral diskectomy was performed.  The posterior longitudinal ligament was identified and entered using a nerve hook.  I then used #1 followed by #2 Kerrison to perform a thorough and complete intervertebral diskectomy.  The spinal canal was thoroughly decompressed, as was the right and left neuroforamen.  The endplates were then prepared and the appropriate-sized intervertebral spacer was then packed with ViviGen and tamped into position in the usual fashion.  The lower Caspar pin was then removed and placed into the C5 vertebral body and once again, distraction was applied across the C4/5 intervertebral space.  I then again performed a thorough and complete diskectomy, thoroughly decompressing the spinal canal and bilateral neuroforamena.  After preparing the endplates, the appropriate-sized intervertebral spacer was packed with ViviGen and tamped into position.  The Caspar pins then were removed and bone wax was placed in their place.  The appropriate-sized anterior cervical plate was placed over the anterior spine.  14 mm variable angle screws were placed, 2 in each vertebral body from C4-C5 for a total of 6 vertebral body screws.  The screws were then locked to the plate using the Cam locking mechanism.  I was very pleased with the final fluoroscopic images.  The wound was then irrigated.  The wound was then explored for any undue bleeding and there was no bleeding noted. The wound was then closed in layers using 2-0 Vicryl, followed by 4-0 Monocryl.  Benzoin and Steri-Strips were applied, followed by sterile dressing.  All instrument counts were correct at the termination of  the procedure.  Of note, Greg CoopKayla McKenzie, Greg Flowers, was my assistant throughout surgery, and did aid in retraction, suctioning, and closure from start to finish.     Greg BambergMark Rayaan Garguilo, MD

## 2017-11-21 NOTE — Transfer of Care (Signed)
Immediate Anesthesia Transfer of Care Note  Patient: Greg Flowers  Procedure(s) Performed: ANTERIOR CERVICAL DECOMPRESSION FUSION, CERVICAL THREE-FOUR, CERVICAL FOUR-FIVE WITH INSTRUMENTATION AND ALLOGRAFT (N/A Spine Cervical)  Patient Location: PACU  Anesthesia Type:General  Level of Consciousness: awake, alert  and oriented  Airway & Oxygen Therapy: Patient Spontanous Breathing and Patient connected to face mask oxygen  Post-op Assessment: Report given to RN and Post -op Vital signs reviewed and stable  Post vital signs: Reviewed and stable  Last Vitals:  Vitals Value Taken Time  BP    Temp    Pulse    Resp    SpO2      Last Pain:  Vitals:   11/21/17 0637  TempSrc:   PainSc: 8       Patients Stated Pain Goal: 3 (11/21/17 [id number])  Complications: No apparent anesthesia complications

## 2017-11-22 ENCOUNTER — Encounter (HOSPITAL_COMMUNITY): Payer: Self-pay | Admitting: Orthopedic Surgery

## 2017-11-22 ENCOUNTER — Encounter: Payer: Self-pay | Admitting: Internal Medicine

## 2017-11-22 DIAGNOSIS — G9520 Unspecified cord compression: Secondary | ICD-10-CM | POA: Diagnosis not present

## 2017-11-22 DIAGNOSIS — K219 Gastro-esophageal reflux disease without esophagitis: Secondary | ICD-10-CM | POA: Diagnosis not present

## 2017-11-22 DIAGNOSIS — Z87891 Personal history of nicotine dependence: Secondary | ICD-10-CM | POA: Diagnosis not present

## 2017-11-22 DIAGNOSIS — B1920 Unspecified viral hepatitis C without hepatic coma: Secondary | ICD-10-CM | POA: Diagnosis not present

## 2017-11-22 DIAGNOSIS — M50021 Cervical disc disorder at C4-C5 level with myelopathy: Secondary | ICD-10-CM | POA: Diagnosis not present

## 2017-11-22 DIAGNOSIS — Z87442 Personal history of urinary calculi: Secondary | ICD-10-CM | POA: Diagnosis not present

## 2017-11-22 DIAGNOSIS — F329 Major depressive disorder, single episode, unspecified: Secondary | ICD-10-CM | POA: Diagnosis not present

## 2017-11-22 DIAGNOSIS — I10 Essential (primary) hypertension: Secondary | ICD-10-CM | POA: Diagnosis not present

## 2017-11-22 DIAGNOSIS — F419 Anxiety disorder, unspecified: Secondary | ICD-10-CM | POA: Diagnosis not present

## 2017-11-22 DIAGNOSIS — G709 Myoneural disorder, unspecified: Secondary | ICD-10-CM | POA: Diagnosis not present

## 2017-11-22 DIAGNOSIS — Z79899 Other long term (current) drug therapy: Secondary | ICD-10-CM | POA: Diagnosis not present

## 2017-11-22 DIAGNOSIS — Z981 Arthrodesis status: Secondary | ICD-10-CM | POA: Diagnosis not present

## 2017-11-22 DIAGNOSIS — Z9889 Other specified postprocedural states: Secondary | ICD-10-CM | POA: Diagnosis not present

## 2017-11-22 MED FILL — Thrombin (Recombinant) For Soln 20000 Unit: CUTANEOUS | Qty: 1 | Status: AC

## 2017-11-22 NOTE — Care Management Note (Signed)
Case Management Note  Patient Details  Name: Lorriane Shireerry L Arras MRN: [id number] Date of Birth: [birth year]  Subjective/Objective:                    Action/Plan: Pt discharging home with self care. Pt has hospital f/u and transportation home.   Expected Discharge Date:  11/22/17               Expected Discharge Plan:  Home/Self Care  In-House Referral:     Discharge planning Services     Post Acute Care Choice:    Choice offered to:     DME Arranged:    DME Agency:     HH Arranged:    HH Agency:     Status of Service:  Completed, signed off  If discussed at [employer] Length of Stay Meetings, dates discussed:    Additional Comments:  Kermit BaloKelli F Chiffon Kittleson, RN 11/22/2017, 10:48 AM

## 2017-11-22 NOTE — Progress Notes (Signed)
Patient is discharged from room 3C10 at this time. Alert and in stable condition. IV site d/c'd and instructions read to patient and spouse with understanding verbalized. Left unit via wheelchair with all belongings at side.  

## 2017-11-24 ENCOUNTER — Encounter: Payer: Self-pay | Admitting: Internal Medicine

## 2017-11-27 NOTE — Progress Notes (Signed)
    Patient doing well with improved myelopathy symptomatology. He has been eating and drinking soft food and fluids, throat is sore and voice is hoarse, he denies SOB and is breathing without difficulty. He has been up and walking. He is pleased with his progress thus far   Physical Exam: Vitals:   11/22/17 0818 11/22/17 1147  BP: (!) 135/104 (!) 141/101  Pulse: 96 81  Resp: 18 17  Temp: 98.6 F (37 C) 98.4 F (36.9 C)  SpO2: 92% 96%    Dressing in place, hard collar in place, neck soft and supple, mod hoarseness of voice, sitting up comfortably NVI   PO Day 1 S/P C3-5 ACDF with removal of previous C5-7 anterior plate and screws  -Improved myelopathy symptomatology  -Cont Percocet and Valium  -Written scripts for pain signed and in chart -Hard collar x 2 weeks, philly collar for showering at bedside -D/C instructions sheet printed and in chart -D/C today  -F/U in office 2 weeks

## 2017-11-27 NOTE — Discharge Summary (Signed)
Patient ID: Greg Flowers MRN: [id number] DOB/AGE: [birth year] 64 y.o.  Admit date: 11/21/2017 Discharge date: 11/22/2017  Admission Diagnoses:  Active Problems:   Radiculopathy   Discharge Diagnoses:  Same  Past Medical History:  Diagnosis Date  . Anxiety   . Calculus, kidney 2000   Sees Dr. Isabel Flowers, urology.   . Cervical spondylosis 2004   sp surgery by Dr. Danielle Flowers  . Depression   . GERD (gastroesophageal reflux disease)   . Headache(784.0)    Chronic, on vicodin 5 TID for this.   . Hepatitis C    Has occasional visits with Dr. Randa Flowers GI, failed RX in 2000.  Liver Biopsy 9/06: Minimally active hepatitis consistent with hepatitis C, minimal necroinflamtory activity grade 1, no incrased fibrosis stage 0.   . Herpes labialis   . History of kidney stones   . Hypertension   . Pneumonia   . Renal stone   . Spondylolisthesis of lumbar region   . Thalassemia    HGB 9/09 14.4 wtih MCV 72.6  . Wears glasses     Surgeries: Procedure(s): ANTERIOR CERVICAL DECOMPRESSION FUSION, CERVICAL THREE-FOUR, CERVICAL FOUR-FIVE WITH INSTRUMENTATION AND ALLOGRAFT on 11/21/2017   Consultants: None  Discharged Condition: Improved  Hospital Course: Greg Flowers is an 62 y.o. male who was admitted 11/21/2017 for operative treatment of myelopathy. Patient has severe unremitting pain that affects sleep, daily activities, and work/hobbies. After pre-op clearance the patient was taken to the operating room on 11/21/2017 and underwent  Procedure(s): ANTERIOR CERVICAL DECOMPRESSION FUSION, CERVICAL THREE-FOUR, CERVICAL FOUR-FIVE WITH INSTRUMENTATION AND ALLOGRAFT.    Patient was given perioperative antibiotics:  Anti-infectives (From admission, onward)   Start     Dose/Rate Route Frequency Ordered Stop   11/21/17 1600  ceFAZolin (ANCEF) IVPB 2g/100 mL premix     2 g 200 mL/hr over 30 Minutes Intravenous Every 8 hours 11/21/17 1303 11/22/17 0130   11/21/17 0630  ceFAZolin (ANCEF) IVPB 2g/100 mL  premix     2 g 200 mL/hr over 30 Minutes Intravenous On call to O.R. 11/21/17 [id number] 11/21/17 0741   11/21/17 0627  ceFAZolin (ANCEF) 2-4 GM/100ML-% IVPB    Note to Pharmacy:  Greg LamingNewell, Greg   : cabinet override      11/21/17 0627 11/21/17 1844       Patient was given sequential compression devices, early ambulation to prevent DVT.  Patient benefited maximally from hospital stay and there were no complications.    Recent vital signs: BP (!) 141/101 (BP Location: Left Arm)   Pulse 81   Temp 98.4 F (36.9 C) (Oral)   Resp 17   Ht 5\' 9"  (1.753 m)   Wt 95.3 kg   SpO2 96%   BMI 31.01 kg/m    Discharge Medications:   Allergies as of 11/22/2017   No Known Allergies     Medication List    TAKE these medications   amLODipine 10 MG tablet Commonly known as:  NORVASC TAKE 1 TABLET BY MOUTH EVERY DAY   atenolol 50 MG tablet Commonly known as:  TENORMIN TAKE 1 TABLET (50 MG TOTAL) BY MOUTH DAILY.   b complex vitamins tablet Take 1 tablet by mouth daily.   benazepril 40 MG tablet Commonly known as:  LOTENSIN TAKE 2 TABLETS (80 MG TOTAL) BY MOUTH DAILY.   DULoxetine 60 MG capsule Commonly known as:  CYMBALTA Take 120 mg by mouth daily.   gabapentin 600 MG tablet Commonly known as:  NEURONTIN Take 600 mg  by mouth 3 (three) times daily.   hydrochlorothiazide 25 MG tablet Commonly known as:  HYDRODIURIL TAKE 1 TABLET (25 MG TOTAL) BY MOUTH DAILY.   multivitamin with minerals Tabs tablet Take 1 tablet by mouth daily.   omeprazole 20 MG capsule Commonly known as:  PRILOSEC Take 20 mg by mouth 2 (two) times daily.   sodium chloride 0.65 % Soln nasal spray Commonly known as:  OCEAN Place 2 sprays into both nostrils as needed for congestion.   tamsulosin 0.4 MG Caps capsule Commonly known as:  FLOMAX Take 0.8 mg by mouth daily.   VICKS SINEX 12 HOUR NA Place 1 spray into the nose 2 (two) times daily as needed (for congestion.).   VISINE OP Place 2 drops into both  eyes as needed (for dry eyes).   vitamin C 1000 MG tablet Take 2,000 mg by mouth daily.   zolpidem 10 MG tablet Commonly known as:  AMBIEN Take 10 mg by mouth at bedtime as needed for sleep.       Diagnostic Studies: Dg Chest 2 View  Result Date: 11/15/2017 CLINICAL DATA:  Preop neck surgery EXAM: CHEST - 2 VIEW COMPARISON:  None. FINDINGS: Cardiac enlargement with normal vascularity. Lungs clear without infiltrate effusion or mass. Cervical fusion. IMPRESSION: No active cardiopulmonary disease. Electronically Signed   By: Marlan Palauharles  Clark M.D.   On: 11/15/2017 16:11   Dg Cervical Spine 1 View  Result Date: 11/21/2017 CLINICAL DATA:  INTRAOPERATIVE FLUOROSCOPY: ANTERIOR CERVICAL DECOMPRESSION FUSION, CERVICAL THREE-FOUR, CERVICAL FOUR-FIVE WITH INSTRUMENTATION AND ALLOGRAFT EXAM: DG CERVICAL SPINE - 1 VIEW; DG C-ARM 61-120 MIN COMPARISON:  None. FINDINGS: Single lateral view shows anterior cervical fusion hardware at the C3 through C5 levels, with intervening disc grafts/spacers. Hardware appears intact and appropriately positioned. Fluoroscopy was provided for 11 seconds. IMPRESSION: Intraoperative image demonstrating anterior cervical fusion hardware at the C3 through C5 levels. No evidence of surgical complicating feature. Electronically Signed   By: Bary RichardStan  Maynard M.D.   On: 11/21/2017 13:01   Dg C-arm 1-60 Min  Result Date: 11/21/2017 CLINICAL DATA:  INTRAOPERATIVE FLUOROSCOPY: ANTERIOR CERVICAL DECOMPRESSION FUSION, CERVICAL THREE-FOUR, CERVICAL FOUR-FIVE WITH INSTRUMENTATION AND ALLOGRAFT EXAM: DG CERVICAL SPINE - 1 VIEW; DG C-ARM 61-120 MIN COMPARISON:  None. FINDINGS: Single lateral view shows anterior cervical fusion hardware at the C3 through C5 levels, with intervening disc grafts/spacers. Hardware appears intact and appropriately positioned. Fluoroscopy was provided for 11 seconds. IMPRESSION: Intraoperative image demonstrating anterior cervical fusion hardware at the C3 through C5  levels. No evidence of surgical complicating feature. Electronically Signed   By: Bary RichardStan  Maynard M.D.   On: 11/21/2017 13:01    Disposition:    PO Day 1 S/P C3-5 ACDF with removal of previous C5-7 anterior plate and screws  -Improved myelopathy symptomatology  -Cont Percocet and Valium  -Written scripts for pain signed and in chart -Hard collar x 2 weeks, philly collar for showering at bedside -D/C instructions sheet printed and in chart -D/C today  -F/U in office 2 weeks   Signed: Georga BoraMCKENZIE, Tyric Rodeheaver J 11/27/2017, 11:20 AM

## 2017-12-04 DIAGNOSIS — Z9889 Other specified postprocedural states: Secondary | ICD-10-CM | POA: Diagnosis not present

## 2017-12-31 DIAGNOSIS — M5416 Radiculopathy, lumbar region: Secondary | ICD-10-CM | POA: Diagnosis not present

## 2017-12-31 DIAGNOSIS — G959 Disease of spinal cord, unspecified: Secondary | ICD-10-CM | POA: Diagnosis not present

## 2018-01-03 DIAGNOSIS — M79605 Pain in left leg: Secondary | ICD-10-CM | POA: Diagnosis not present

## 2018-01-07 NOTE — Telephone Encounter (Signed)
Review meds 

## 2018-01-17 ENCOUNTER — Ambulatory Visit (INDEPENDENT_AMBULATORY_CARE_PROVIDER_SITE_OTHER): Payer: Federal, State, Local not specified - PPO | Admitting: Internal Medicine

## 2018-01-17 ENCOUNTER — Encounter: Payer: Self-pay | Admitting: Internal Medicine

## 2018-01-17 VITALS — BP 137/93 | HR 83 | Temp 97.8°F | Wt 212.1 lb

## 2018-01-17 DIAGNOSIS — Z981 Arthrodesis status: Secondary | ICD-10-CM

## 2018-01-17 DIAGNOSIS — I10 Essential (primary) hypertension: Secondary | ICD-10-CM

## 2018-01-17 DIAGNOSIS — Z79899 Other long term (current) drug therapy: Secondary | ICD-10-CM

## 2018-01-17 DIAGNOSIS — M549 Dorsalgia, unspecified: Secondary | ICD-10-CM | POA: Diagnosis not present

## 2018-01-17 DIAGNOSIS — M545 Low back pain, unspecified: Secondary | ICD-10-CM

## 2018-01-17 DIAGNOSIS — Z79891 Long term (current) use of opiate analgesic: Secondary | ICD-10-CM

## 2018-01-17 DIAGNOSIS — R634 Abnormal weight loss: Secondary | ICD-10-CM

## 2018-01-17 DIAGNOSIS — G8929 Other chronic pain: Secondary | ICD-10-CM | POA: Diagnosis not present

## 2018-01-17 DIAGNOSIS — R202 Paresthesia of skin: Secondary | ICD-10-CM

## 2018-01-17 DIAGNOSIS — Z9181 History of falling: Secondary | ICD-10-CM

## 2018-01-17 DIAGNOSIS — Z23 Encounter for immunization: Secondary | ICD-10-CM | POA: Diagnosis not present

## 2018-01-17 DIAGNOSIS — Z6831 Body mass index (BMI) 31.0-31.9, adult: Secondary | ICD-10-CM

## 2018-01-17 NOTE — Progress Notes (Signed)
   CC: Chronic back pain follow up   HPI:  [name] is a 62 y.o. year-old male with PMH listed below who presents to clinic for follow up of chronic back pain and HTN. Please see problem based assessment and plan for further details.   Past Medical History:  Diagnosis Date  . Anxiety   . Calculus, kidney 2000   Sees Dr. Isabel Flowers, urology.   . Cervical spondylosis 2004   sp surgery by Dr. Danielle Flowers  . Depression   . GERD (gastroesophageal reflux disease)   . Headache(784.0)    Chronic, on vicodin 5 TID for this.   . Hepatitis C    Has occasional visits with Dr. Randa Flowers GI, failed RX in 2000.  Liver Biopsy 9/06: Minimally active hepatitis consistent with hepatitis C, minimal necroinflamtory activity grade 1, no incrased fibrosis stage 0.   . Herpes labialis   . History of kidney stones   . Hypertension   . Pneumonia   . Renal stone   . Spondylolisthesis of lumbar region   . Thalassemia    HGB 9/09 14.4 wtih MCV 72.6  . Wears glasses    Review of Systems:   Review of Systems  Constitutional: Positive for weight loss. Negative for chills, fever and malaise/fatigue.  Respiratory: Negative for cough and shortness of breath.   Cardiovascular: Negative for chest pain, palpitations and leg swelling.  Gastrointestinal: Negative for abdominal pain, nausea and vomiting.  Musculoskeletal: Positive for back pain, falls and joint pain.  Neurological: Positive for tingling. Negative for dizziness, sensory change, focal weakness and headaches.    Physical Exam: Vitals:   01/17/18 1433  BP: (!) 137/93  Pulse: 83  Temp: 97.8 F (36.6 C)  TempSrc: Oral  SpO2: 98%  Weight: 212 lb 1.6 oz (96.2 kg)    General: elderly male, sitting up in chair in no acute distress  Cardiac: regular rate and rhythm, nl S1/S2, no murmurs, rubs or gallops Pulm: CTAB, no wheezes or crackles, no increased work of breathing on room air  Neuro: A&Ox3, CN II-XII intact, sensation is intact in all 4  extremities, 4/5 strength in bilateral LE (chronic) Ext: warm and well perfused, no peripheral edema bilaterally      Office Visit from 08/24/2013 in [facility]  PHQ-9 Total Score  4       Assessment & Plan:   See Encounters Tab for problem based charting.  Patient discussed with Dr. Oswaldo Flowers

## 2018-01-17 NOTE — Patient Instructions (Signed)
Mr. Rahal,   I'm glad your back surgery went well.  Continue taking Cymbalta, gabapentin, and tramadol for pain.  I will wait to hear from you to make the referral to the pain clinic.   You received your flu shot today.  Please come back in 6 months for follow-up or sooner if needed.  Please call us if you have any questions or concerns.   - Dr. Evelene Croon

## 2018-01-20 ENCOUNTER — Encounter: Payer: Self-pay | Admitting: Internal Medicine

## 2018-01-20 NOTE — Assessment & Plan Note (Signed)
[name] presents for follow up of chronic back pain. He underwent ACDF of C3-C4 and C4-C5 2 months ago and reports doing well since then. He continues to experience diffuse back pain and difficulty walking but has noticed his functional status has significantly improved since surgery. States his balance is better and LE weakness has improved as well. He takes gabapentin and Cymbalta for pain and his neurosurgeon is prescribing Tramadol. He is interested in a referral to pain clinic, but would like to try acupuncture first.   Chronic back pain secondary to OA:  - Continue gabapentin and Cymbalta. Neurosurgery providing Tramadol   - Follow up in 6 months  - Advised patient to contact me in the near future if he continues to be interested in pain clinic referral after trying acupuncture

## 2018-01-20 NOTE — Progress Notes (Signed)
Internal Medicine Clinic Attending  Case discussed with Dr. Santos-Sanchez at the time of the visit.  We reviewed the resident's history and exam and pertinent patient test results.  I agree with the assessment, diagnosis, and plan of care documented in the resident's note.    

## 2018-01-28 ENCOUNTER — Other Ambulatory Visit: Payer: Self-pay | Admitting: Psychiatry

## 2018-02-05 DIAGNOSIS — M79605 Pain in left leg: Secondary | ICD-10-CM | POA: Diagnosis not present

## 2018-02-11 DIAGNOSIS — M5416 Radiculopathy, lumbar region: Secondary | ICD-10-CM | POA: Diagnosis not present

## 2018-02-26 ENCOUNTER — Other Ambulatory Visit: Payer: Self-pay | Admitting: Internal Medicine

## 2018-02-26 DIAGNOSIS — M5416 Radiculopathy, lumbar region: Secondary | ICD-10-CM

## 2018-03-11 DIAGNOSIS — M5416 Radiculopathy, lumbar region: Secondary | ICD-10-CM | POA: Diagnosis not present

## 2018-03-21 DIAGNOSIS — R3915 Urgency of urination: Secondary | ICD-10-CM | POA: Diagnosis not present

## 2018-03-21 DIAGNOSIS — R351 Nocturia: Secondary | ICD-10-CM | POA: Diagnosis not present

## 2018-03-24 DIAGNOSIS — M545 Low back pain: Secondary | ICD-10-CM | POA: Diagnosis not present

## 2018-03-25 DIAGNOSIS — N320 Bladder-neck obstruction: Secondary | ICD-10-CM | POA: Diagnosis not present

## 2018-03-27 ENCOUNTER — Other Ambulatory Visit: Payer: Self-pay | Admitting: Internal Medicine

## 2018-03-27 DIAGNOSIS — I10 Essential (primary) hypertension: Secondary | ICD-10-CM

## 2018-03-31 ENCOUNTER — Encounter: Payer: Self-pay | Admitting: Internal Medicine

## 2018-04-02 DIAGNOSIS — M25552 Pain in left hip: Secondary | ICD-10-CM | POA: Diagnosis not present

## 2018-04-07 ENCOUNTER — Other Ambulatory Visit: Payer: Self-pay | Admitting: Psychiatry

## 2018-04-07 NOTE — Telephone Encounter (Signed)
Need to review paper chart  

## 2018-04-18 DIAGNOSIS — M25552 Pain in left hip: Secondary | ICD-10-CM | POA: Diagnosis not present

## 2018-04-24 ENCOUNTER — Ambulatory Visit: Payer: Federal, State, Local not specified - PPO | Attending: Orthopedic Surgery

## 2018-04-24 ENCOUNTER — Other Ambulatory Visit: Payer: Self-pay

## 2018-04-24 DIAGNOSIS — G8929 Other chronic pain: Secondary | ICD-10-CM | POA: Diagnosis not present

## 2018-04-24 DIAGNOSIS — M25552 Pain in left hip: Secondary | ICD-10-CM | POA: Diagnosis not present

## 2018-04-24 DIAGNOSIS — M545 Low back pain: Secondary | ICD-10-CM | POA: Insufficient documentation

## 2018-04-24 DIAGNOSIS — M62830 Muscle spasm of back: Secondary | ICD-10-CM | POA: Insufficient documentation

## 2018-04-24 DIAGNOSIS — R262 Difficulty in walking, not elsewhere classified: Secondary | ICD-10-CM | POA: Diagnosis not present

## 2018-04-24 DIAGNOSIS — R293 Abnormal posture: Secondary | ICD-10-CM | POA: Diagnosis not present

## 2018-04-24 DIAGNOSIS — M25652 Stiffness of left hip, not elsewhere classified: Secondary | ICD-10-CM | POA: Insufficient documentation

## 2018-04-24 DIAGNOSIS — M6281 Muscle weakness (generalized): Secondary | ICD-10-CM

## 2018-04-24 NOTE — Patient Instructions (Addendum)
Issued  Stretch hip IR/ER/ Fl exion/ Adduction/ abduction 3x/day 3 reps 30 sec each LT

## 2018-04-24 NOTE — Therapy (Signed)
[facility] [address] [place], [place], [address] Phone: [phone]   Fax:  [phone]  Physical Therapy Evaluation  Patient Details  Name: Greg Flowers MRN: [id number] Date of Birth: 04-20-1955 Referring Provider (PT): Estill BambergMark Dumonski, MD   Encounter Date: 04/24/2018  PT End of Session - 04/24/18 1539    Visit Number  1    Number of Visits  12    Date for PT Re-Evaluation  04/24/18    Authorization Type  [employer] Federal    Authorization Time Period  progress note visit 10 and KX visit 15    PT Start Time  0336    PT Stop Time  0425    PT Time Calculation (min)  49 min    Activity Tolerance  Patient tolerated treatment well    Behavior During Therapy  [facility] for tasks assessed/performed       Past Medical History:  Diagnosis Date  . Anxiety   . Calculus, kidney 2000   Sees Dr. Isabel CapriceGrapey, urology.   . Cervical spondylosis 2004   sp surgery by Dr. Danielle DessElsner  . Depression   . GERD (gastroesophageal reflux disease)   . Headache(784.0)    Chronic, on vicodin 5 TID for this.   . Hepatitis C    Has occasional visits with Dr. Randa EvensEdwards GI, failed RX in 2000.  Liver Biopsy 9/06: Minimally active hepatitis consistent with hepatitis C, minimal necroinflamtory activity grade 1, no incrased fibrosis stage 0.   . Herpes labialis   . History of kidney stones   . Hypertension   . Pneumonia   . Renal stone   . Spondylolisthesis of lumbar region   . Thalassemia    HGB 9/09 14.4 wtih MCV 72.6  . Wears glasses     Past Surgical History:  Procedure Laterality Date  . ANTERIOR CERVICAL DECOMP/DISCECTOMY FUSION N/A 11/21/2017   Procedure: ANTERIOR CERVICAL DECOMPRESSION FUSION, CERVICAL THREE-FOUR, CERVICAL FOUR-FIVE WITH INSTRUMENTATION AND ALLOGRAFT;  Surgeon: Estill Bambergumonski, Mark, MD;  Location: [facility] OR;  Service: Orthopedics;  Laterality: N/A;  ANTERIOR CERVICAL DECOMPRESSION FUSION, CERVICAL THREE-FOUR, CERVICAL FOUR-FIVE WITH INSTRUMENTATION AND  ALLOGRAFT  . BACK SURGERY  2017   L4-L5 PLATE/SCREWS  . CERVICAL DISCECTOMY  2004   Dr Danielle DessElsner  . COLONOSCOPY    . LITHOTRIPSY  2004   Dr Logan BoresEvans  . NASAL SEPTUM SURGERY  2007  . TONSILLECTOMY      There were no vitals filed for this visit.   Subjective Assessment - 04/24/18 1546    Subjective  He reports he bagan to have  problems 08/2017 and subsequently  had revison surgery to neck.   This helped but continued with  Lt hip pain that was injected and pain improved but  continues. May get sent to ortho MD for hip in future.     Limitations  Standing;Walking    Diagnostic tests  No x-rays to hip.     Patient Stated Goals  Decr pain and walk better    Currently in Pain?  Yes    Pain Score  6     Pain Location  Hip    Pain Orientation  Left;Anterior    Pain Descriptors / Indicators  Sharp    Pain Type  Chronic pain    Pain Onset  More than a month ago    Pain Frequency  Constant    Aggravating Factors   Walking, lying on hip sit low and getting up     Pain Relieving  Factors  gentle moving , meds         [facility] PT Assessment - 04/24/18 0001      Assessment   Medical Diagnosis  left hip pain    Referring Provider (PT)  Estill BambergMark Dumonski, MD    Onset Date/Surgical Date  --   08/2017,   neck surgery 11/21/2017   Next MD Visit  After PT or as needed    Prior Therapy  Not for hip      Precautions   Precautions  None      Restrictions   Weight Bearing Restrictions  No      Balance Screen   Has the patient fallen in the past 6 months  Yes    How many times?  10   walking, bending over  , LT leg gives out.    Has the patient had a decrease in activity level because of a fear of falling?   Yes    Is the patient reluctant to leave their home because of a fear of falling?   No      Prior Function   Level of Independence  Independent      Cognition   Overall Cognitive Status  Within Functional Limits for tasks assessed      ROM / Strength   AROM / PROM / Strength   AROM;PROM;Strength      AROM   AROM Assessment Site  Hip    Right/Left Hip  Left      PROM   PROM Assessment Site  Hip    Right/Left Hip  Left;Right    Right Hip External Rotation   45    Right Hip Internal Rotation   45    Left Hip Extension  10    Left Hip Flexion  98    Left Hip External Rotation   35    Left Hip Internal Rotation   20    Left Hip ABduction  28    Left Hip ADduction  17      Strength   Strength Assessment Site  Hip;Ankle;Knee    Right/Left Hip  Left;Right    Right Hip Flexion  4/5    Right Hip Extension  4-/5    Right Hip External Rotation   4/5    Right Hip Internal Rotation  4/5    Right Hip ABduction  4-/5    Right Hip ADduction  4/5    Left Hip Flexion  4/5    Left Hip Extension  4+/5    Left Hip External Rotation  5/5    Left Hip Internal Rotation  5/5    Left Hip ABduction  4+/5    Left Hip ADduction  4+/5    Right/Left Knee  Left    Left Knee Flexion  --   5-/5   Left Knee Extension  5/5    Right/Left Ankle  Left    Left Ankle Dorsiflexion  5/5    Left Ankle Inversion  5/5    Left Ankle Eversion  5/5      Right Hip   Right Hip Extension  25    Right Hip Flexion  125    Right Hip ABduction  40    Right Hip ADduction  28      Special Tests   Other special tests  single leg stand RT   < 3 sec LT # sec  Objective measurements completed on examination: See above findings.              PT Education - 04/24/18 1633    Education Details  POC findings , HEP    Person(s) Educated  Patient    Methods  Explanation;Demonstration;Tactile cues;Verbal cues;Handout    Comprehension  Verbalized understanding;Returned demonstration       PT Short Term Goals - 04/24/18 1540      PT SHORT TERM GOAL #1   Title  He will be independent with initial HEP    Time  2    Period  Weeks    Status  New      PT SHORT TERM GOAL #2   Title  He will report pain decreased 20-30% with normal activity in LT hip    Time  3     Period  Weeks    Status  New        PT Long Term Goals - 04/24/18 1541      PT LONG TERM GOAL #1   Title  FOTO will decreased to < 40% limited    Time  6    Period  Weeks    Status  New      PT LONG TERM GOAL #3   Title  He will report pain decreased 50% or more  in LT hipwith normal activity    Time  6    Period  Weeks    Status  New      PT LONG TERM GOAL #4   Title  He will be independent with all HEP issued    Time  6    Period  Weeks    Status  New             Plan - 04/24/18 1540    Clinical Impression Statement  Greg Flowers presents with LT hip pain and stiffness making walking and standing for periods difficult and creates decreased stability with falls though weakness of RT leg make contribute to falls..    Skilled PT should help decrease pain and improve mobility but if not he may need and xray and closer assessment of Lt hip      History and Personal Factors relevant to plan of care:  S/P cervical surgery x2   , lumbar surgery, weakness, chronic pain    Clinical Presentation  Stable    Clinical Presentation due to:  pain in left hip making mobility difficult and unsteady.     Clinical Decision Making  Low    Rehab Potential  Good    Clinical Impairments Affecting Rehab Potential  previous chronic pain and second neck surgery.     PT Frequency  2x / week    PT Duration  6 weeks    PT Treatment/Interventions  Passive range of motion;Manual techniques;Patient/family education;Therapeutic exercise;Moist Heat;Iontophoresis 4mg /ml Dexamethasone;Ultrasound;Cryotherapy;Electrical Stimulation    PT Next Visit Plan  REview stretches HEP, add for LE strength , manual for ROM,  modalities as needed    PT Home Exercise Plan  Lt hip stretch  ER/IR/ Flex/abduction / adduction    Consulted and Agree with Plan of Care  Patient       Patient will benefit from skilled therapeutic intervention in order to improve the following deficits and impairments:  Pain, Decreased  strength, Decreased range of motion, Difficulty walking  Visit Diagnosis: Pain in left hip     Problem List Patient Active Problem List   Diagnosis Date Noted  .  Radiculopathy 11/21/2017  . Urinary retention 11/08/2017  . Liver fibrosis 04/30/2017  . Thoracic radiculopathy 09/12/2016  . Spondylolisthesis at L4-L5 level 02/13/2016  . NSAID long-term use 01/09/2016  . Chronic back pain 01/09/2016  . Osteoarthritis of both knees 11/02/2011  . NECK AND BACK PAIN 11/03/2009  . FOOT PAIN, BILATERAL 09/20/2009  . DISORDER, DEPRESSIVE NEC 09/25/2006  . HERPES LABIALIS 04/04/2006  . Chronic hepatitis C without hepatic coma (HCC) 02/22/2006  . Essential hypertension 02/22/2006  . SPONDYLOSIS, CERVICAL 02/22/2006    Caprice Red  PT 04/24/2018, 5:37 PM  [facility] [address] [place], [place], [address] Phone: [phone]   Fax:  [phone]  Name: Greg Flowers MRN: [id number] Date of Birth: Jan 12, 1956

## 2018-04-26 ENCOUNTER — Other Ambulatory Visit: Payer: Self-pay | Admitting: Internal Medicine

## 2018-04-26 DIAGNOSIS — I10 Essential (primary) hypertension: Secondary | ICD-10-CM

## 2018-05-06 ENCOUNTER — Ambulatory Visit: Payer: Federal, State, Local not specified - PPO | Admitting: Physical Therapy

## 2018-05-08 ENCOUNTER — Encounter: Payer: Self-pay | Admitting: Physical Therapy

## 2018-05-08 ENCOUNTER — Ambulatory Visit: Payer: Federal, State, Local not specified - PPO | Admitting: Physical Therapy

## 2018-05-08 DIAGNOSIS — M545 Low back pain, unspecified: Secondary | ICD-10-CM

## 2018-05-08 DIAGNOSIS — R293 Abnormal posture: Secondary | ICD-10-CM | POA: Diagnosis not present

## 2018-05-08 DIAGNOSIS — R262 Difficulty in walking, not elsewhere classified: Secondary | ICD-10-CM

## 2018-05-08 DIAGNOSIS — M62830 Muscle spasm of back: Secondary | ICD-10-CM | POA: Diagnosis not present

## 2018-05-08 DIAGNOSIS — M25652 Stiffness of left hip, not elsewhere classified: Secondary | ICD-10-CM

## 2018-05-08 DIAGNOSIS — G8929 Other chronic pain: Secondary | ICD-10-CM

## 2018-05-08 DIAGNOSIS — M25552 Pain in left hip: Secondary | ICD-10-CM

## 2018-05-08 DIAGNOSIS — M6281 Muscle weakness (generalized): Secondary | ICD-10-CM

## 2018-05-08 NOTE — Therapy (Signed)
[facility] [address] [place], [place], [address] Phone: [phone]   Fax:  [phone]  Physical Therapy Treatment  Patient Details  Name: Greg Flowers MRN: [id number] Date of Birth: Aug 03, 1955 Referring Provider (PT): Estill Bamberg, MD   Encounter Date: 05/08/2018  PT End of Session - 05/08/18 1429    Visit Number  2    Number of Visits  12    Date for PT Re-Evaluation  04/24/18    Authorization Type  [employer]BS Federal    Authorization Time Period  progress note visit 10 and KX visit 15    PT Start Time  1358    PT Stop Time  1428    PT Time Calculation (min)  30 min    Activity Tolerance  Patient tolerated treatment well    Behavior During Therapy  [facility] for tasks assessed/performed       Past Medical History:  Diagnosis Date  . Anxiety   . Calculus, kidney 2000   Sees Dr. Isabel Caprice, urology.   . Cervical spondylosis 2004   sp surgery by Dr. Danielle Dess  . Depression   . GERD (gastroesophageal reflux disease)   . Headache(784.0)    Chronic, on vicodin 5 TID for this.   . Hepatitis C    Has occasional visits with Dr. Randa Evens GI, failed RX in 2000.  Liver Biopsy 9/06: Minimally active hepatitis consistent with hepatitis C, minimal necroinflamtory activity grade 1, no incrased fibrosis stage 0.   . Herpes labialis   . History of kidney stones   . Hypertension   . Pneumonia   . Renal stone   . Spondylolisthesis of lumbar region   . Thalassemia    HGB 9/09 14.4 wtih MCV 72.6  . Wears glasses     Past Surgical History:  Procedure Laterality Date  . ANTERIOR CERVICAL DECOMP/DISCECTOMY FUSION N/A 11/21/2017   Procedure: ANTERIOR CERVICAL DECOMPRESSION FUSION, CERVICAL THREE-FOUR, CERVICAL FOUR-FIVE WITH INSTRUMENTATION AND ALLOGRAFT;  Surgeon: Estill Bamberg, MD;  Location: [facility] OR;  Service: Orthopedics;  Laterality: N/A;  ANTERIOR CERVICAL DECOMPRESSION FUSION, CERVICAL THREE-FOUR, CERVICAL FOUR-FIVE WITH INSTRUMENTATION AND  ALLOGRAFT  . BACK SURGERY  2017   L4-L5 PLATE/SCREWS  . CERVICAL DISCECTOMY  2004   Dr Danielle Dess  . COLONOSCOPY    . LITHOTRIPSY  2004   Dr Logan Bores  . NASAL SEPTUM SURGERY  2007  . TONSILLECTOMY      There were no vitals filed for this visit.  Subjective Assessment - 05/08/18 1400    Subjective  "I think I'm  having trouble with my left hip more than anything. I was sick last week which is why I had to cancel."    Currently in Pain?  Yes    Pain Score  7     Pain Location  Hip    Pain Orientation  Left;Anterior;Lateral    Pain Descriptors / Indicators  Stabbing    Pain Type  Chronic pain                       [facility] Adult PT Treatment/Exercise - 05/08/18 0001      Self-Care   Self-Care  Other Self-Care Comments    Other Self-Care Comments   STM with tennis ball, rolling to prevent back pain and maintain neutral spine      Exercises   Exercises  Knee/Hip      Knee/Hip Exercises: Stretches   Passive Hamstring Stretch  3 reps;60 seconds  Passive Hamstring Stretch Limitations  Contract-relax to increase range    Piriformis Stretch  Both;30 seconds;2 reps    Piriformis Stretch Limitations  supine    Other Knee/Hip Stretches  Single leg to chest x2 each side hold 30 sec    Other Knee/Hip Stretches  Trunk rotation with bent x15 to for relaxation      Knee/Hip Exercises: Supine   Other Supine Knee/Hip Exercises  Clamshells x10; x10 with red TB   Cues for form and slow eccentric control     Manual Therapy   Manual Therapy  Soft tissue mobilization;Myofascial release    Manual therapy comments  STM to Gluts and quadratus lumborum    Myofascial Release  Glut Medius to increase range and decrease pain               PT Short Term Goals - 04/24/18 1540      PT SHORT TERM GOAL #1   Title  He will be independent with initial HEP    Time  2    Period  Weeks    Status  New      PT SHORT TERM GOAL #2   Title  He will report pain decreased 20-30% with  normal activity in LT hip    Time  3    Period  Weeks    Status  New        PT Long Term Goals - 04/24/18 1541      PT LONG TERM GOAL #1   Title  FOTO will decreased to < 40% limited    Time  6    Period  Weeks    Status  New      PT LONG TERM GOAL #3   Title  He will report pain decreased 50% or more  in LT hipwith normal activity    Time  6    Period  Weeks    Status  New      PT LONG TERM GOAL #4   Title  He will be independent with all HEP issued    Time  6    Period  Weeks    Status  New            Plan - 05/08/18 1436    Clinical Impression Statement   Mr. Frohn reports having a 7/10 pain today in the left hip and also arrived about 15 minutes late. Informed pt that if he is late we cannot spend as much time with him and he verbalized understanding. Educated pt on tennis ball use for STM at home and clamshell exercises in supine with red TB. Pt may need clarification on adduction hip stretch from HEP because he states it's causing him pain.     PT Next Visit Plan  REview stretches HEP, add for LE strength , manual for ROM,  modalities as needed, add bridging, ab sets, PPT    PT Home Exercise Plan  Lt hip stretch  ER/IR/ Flex/abduction / adduction    Consulted and Agree with Plan of Care  Patient       Patient will benefit from skilled therapeutic intervention in order to improve the following deficits and impairments:     Visit Diagnosis: Pain in left hip  Abnormal posture  Difficulty in walking, not elsewhere classified  Muscle weakness (generalized)  Stiffness of left hip, not elsewhere classified  Muscle spasm of back  Chronic bilateral low back pain without sciatica  Acute bilateral low back pain  without sciatica     Problem List Patient Active Problem List   Diagnosis Date Noted  . Radiculopathy 11/21/2017  . Urinary retention 11/08/2017  . Liver fibrosis 04/30/2017  . Thoracic radiculopathy 09/12/2016  . Spondylolisthesis at L4-L5  level 02/13/2016  . NSAID long-term use 01/09/2016  . Chronic back pain 01/09/2016  . Osteoarthritis of both knees 11/02/2011  . NECK AND BACK PAIN 11/03/2009  . FOOT PAIN, BILATERAL 09/20/2009  . DISORDER, DEPRESSIVE NEC 09/25/2006  . HERPES LABIALIS 04/04/2006  . Chronic hepatitis C without hepatic coma (HCC) 02/22/2006  . Essential hypertension 02/22/2006  . SPONDYLOSIS, CERVICAL 02/22/2006    Royetta AsalHeather Caralina Nop, SPTA 05/08/2018, 2:42 PM  [facility] [address] [place], [place], [address] Phone: [phone]   Fax:  [phone]  Name: Lorriane Shireerry L Cumpton MRN: [id number] Date of Birth: [birth year]

## 2018-05-13 ENCOUNTER — Encounter: Payer: Self-pay | Admitting: Physical Therapy

## 2018-05-13 ENCOUNTER — Ambulatory Visit: Payer: Federal, State, Local not specified - PPO | Admitting: Physical Therapy

## 2018-05-13 DIAGNOSIS — R293 Abnormal posture: Secondary | ICD-10-CM | POA: Diagnosis not present

## 2018-05-13 DIAGNOSIS — M25652 Stiffness of left hip, not elsewhere classified: Secondary | ICD-10-CM | POA: Diagnosis not present

## 2018-05-13 DIAGNOSIS — R262 Difficulty in walking, not elsewhere classified: Secondary | ICD-10-CM

## 2018-05-13 DIAGNOSIS — G8929 Other chronic pain: Secondary | ICD-10-CM

## 2018-05-13 DIAGNOSIS — M62830 Muscle spasm of back: Secondary | ICD-10-CM | POA: Diagnosis not present

## 2018-05-13 DIAGNOSIS — M545 Low back pain, unspecified: Secondary | ICD-10-CM

## 2018-05-13 DIAGNOSIS — M6281 Muscle weakness (generalized): Secondary | ICD-10-CM | POA: Diagnosis not present

## 2018-05-13 DIAGNOSIS — M25552 Pain in left hip: Secondary | ICD-10-CM

## 2018-05-13 NOTE — Therapy (Signed)
[facility] [address] [place], [place], [address] Phone: [phone]   Fax:  [phone]  Physical Therapy Treatment  Patient Details  Name: Greg Flowers MRN: [id number] Date of Birth: 10-09-1955 Referring Provider (PT): Greg BambergMark Dumonski, Flowers   Encounter Date: 05/13/2018  PT End of Session - 05/13/18 1446    Visit Number  3    Number of Visits  12    Date for PT Re-Evaluation  04/24/18    Authorization Type  [employer] Federal    Authorization Time Period  progress note visit 10 and KX visit 15    PT Start Time  1347    PT Stop Time  1430    PT Time Calculation (min)  43 min    Equipment Utilized During Treatment  Gait belt    Activity Tolerance  Patient tolerated treatment well    Behavior During Therapy  WFL for tasks assessed/performed       Past Medical History:  Diagnosis Date  . Anxiety   . Calculus, kidney 2000   Sees Greg Flowers, urology.   . Cervical spondylosis 2004   sp surgery by Greg. Danielle DessElsner  . Depression   . GERD (gastroesophageal reflux disease)   . Headache(784.0)    Chronic, on vicodin 5 TID for this.   . Hepatitis C    Has occasional visits with Greg. Randa Flowers [facility], failed RX in 2000.  Liver Biopsy 9/06: Minimally active hepatitis consistent with hepatitis C, minimal necroinflamtory activity grade 1, no incrased fibrosis stage 0.   . Herpes labialis   . History of kidney stones   . Hypertension   . Pneumonia   . Renal stone   . Spondylolisthesis of lumbar region   . Thalassemia    HGB 9/09 14.4 wtih MCV 72.6  . Wears glasses     Past Surgical History:  Procedure Laterality Date  . ANTERIOR CERVICAL DECOMP/DISCECTOMY FUSION N/A 11/21/2017   Procedure: ANTERIOR CERVICAL DECOMPRESSION FUSION, CERVICAL THREE-FOUR, CERVICAL FOUR-FIVE WITH INSTRUMENTATION AND ALLOGRAFT;  Surgeon: Greg Bambergumonski, Mark, Flowers;  Location: [facility] OR;  Service: Orthopedics;  Laterality: N/A;  ANTERIOR CERVICAL DECOMPRESSION FUSION, CERVICAL  THREE-FOUR, CERVICAL FOUR-FIVE WITH INSTRUMENTATION AND ALLOGRAFT  . BACK SURGERY  2017   L4-L5 PLATE/SCREWS  . CERVICAL DISCECTOMY  2004   Greg Danielle DessElsner  . COLONOSCOPY    . LITHOTRIPSY  2004   Greg Flowers  . NASAL SEPTUM SURGERY  2007  . TONSILLECTOMY      There were no vitals filed for this visit.  Subjective Assessment - 05/13/18 1351    Subjective  Pt reports he started taking meloxocam prescribed from his Flowers after last session and reports having some pain relieve.     Currently in Pain?  Yes    Pain Score  3     Pain Location  Hip    Pain Orientation  Left;Lateral    Pain Descriptors / Indicators  Stabbing    Pain Type  Chronic pain    Aggravating Factors   sit and standing prolonged    Pain Relieving Factors  meds, heat patches, stretches, tennis ball                       [facility] Adult PT Treatment/Exercise - 05/13/18 0001      Knee/Hip Exercises: Stretches   Passive Hamstring Stretch  2 reps;20 seconds    Piriformis Stretch  Both;30 seconds;2 reps    Piriformis Stretch Limitations  supine  Other Knee/Hip Stretches  Single leg to chest x2 each side hold 30 sec; Adductor stretch    Other Knee/Hip Stretches  Trunk rotation with bent x15 to for relaxation      Knee/Hip Exercises: Standing   Functional Squat  15 reps   At sink    Other Standing Knee Exercises  Functional squatting while picking up cones to improve balance;     Other Standing Knee Exercises  STS to improvr quad strength and eccentric control      Knee/Hip Exercises: Supine   Bridges  Strengthening;Both;15 reps    Bridges with [employer]  Strengthening;Both;15 reps               PT Short Term Goals - 04/24/18 1540      PT SHORT TERM GOAL #1   Title  He will be independent with initial HEP    Time  2    Period  Weeks    Status  New      PT SHORT TERM GOAL #2   Title  He will report pain decreased 20-30% with normal activity in LT hip    Time  3    Period  Weeks    Status   New        PT Long Term Goals - 04/24/18 1541      PT LONG TERM GOAL #1   Title  FOTO will decreased to < 40% limited    Time  6    Period  Weeks    Status  New      PT LONG TERM GOAL #3   Title  He will report pain decreased 50% or more  in LT hipwith normal activity    Time  6    Period  Weeks    Status  New      PT LONG TERM GOAL #4   Title  He will be independent with all HEP issued    Time  6    Period  Weeks    Status  New            Plan - 05/13/18 1455    Clinical Impression Statement  Mr. [name] reports having minimal pain today in the right hip and was only a couple of minutes late for his appointment. Pt tolerated treatment well and stated that he was having trouble picking up objects from the floor. Therapist added some functional squats and reaching to grasp cones from the floor to address balance and quad strength. Pt is continuing his HEP and educated on how o perform functional squat safely.     PT Next Visit Plan  REview stretches HEP, add for LE strength , manual for ROM,  modalities as needed, add bridging, ab sets, PPT    PT Home Exercise Plan  Lt hip stretch  ER/IR/ Flex/abduction / adduction, bridging, functional squats at counter    Consulted and Agree with Plan of Care  Patient      During this treatment session, the therapist was present, participating in and directing the treatment.  Patient will benefit from skilled therapeutic intervention in order to improve the following deficits and impairments:     Visit Diagnosis: Pain in left hip  Abnormal posture  Difficulty in walking, not elsewhere classified  Muscle weakness (generalized)  Muscle spasm of back  Chronic bilateral low back pain without sciatica  Stiffness of left hip, not elsewhere classified  Acute bilateral low back pain without sciatica  Problem List Patient Active Problem List   Diagnosis Date Noted  . Radiculopathy 11/21/2017  . Urinary retention 11/08/2017   . Liver fibrosis 04/30/2017  . Thoracic radiculopathy 09/12/2016  . Spondylolisthesis at L4-L5 level 02/13/2016  . NSAID long-term use 01/09/2016  . Chronic back pain 01/09/2016  . Osteoarthritis of both knees 11/02/2011  . NECK AND BACK PAIN 11/03/2009  . FOOT PAIN, BILATERAL 09/20/2009  . DISORDER, DEPRESSIVE NEC 09/25/2006  . HERPES LABIALIS 04/04/2006  . Chronic hepatitis C without hepatic coma (HCC) 02/22/2006  . Essential hypertension 02/22/2006  . SPONDYLOSIS, CERVICAL 02/22/2006    Royetta Asal, SPTA 05/13/2018, 3:01 PM   Jannette Spanner, PTA 05/14/18 7:49 AM Phone: [phone] Fax: [phone]  [facility] [address] [place], [place], [address] Phone: [phone]   Fax:  [phone]  Name: LAREY HERTEL MRN: [id number] Date of Birth: 1956-04-16

## 2018-05-15 ENCOUNTER — Encounter: Payer: Self-pay | Admitting: Physical Therapy

## 2018-05-15 ENCOUNTER — Ambulatory Visit: Payer: Federal, State, Local not specified - PPO | Admitting: Physical Therapy

## 2018-05-15 DIAGNOSIS — M545 Low back pain, unspecified: Secondary | ICD-10-CM

## 2018-05-15 DIAGNOSIS — M25652 Stiffness of left hip, not elsewhere classified: Secondary | ICD-10-CM | POA: Diagnosis not present

## 2018-05-15 DIAGNOSIS — M25552 Pain in left hip: Secondary | ICD-10-CM

## 2018-05-15 DIAGNOSIS — R293 Abnormal posture: Secondary | ICD-10-CM

## 2018-05-15 DIAGNOSIS — M62830 Muscle spasm of back: Secondary | ICD-10-CM | POA: Diagnosis not present

## 2018-05-15 DIAGNOSIS — G8929 Other chronic pain: Secondary | ICD-10-CM | POA: Diagnosis not present

## 2018-05-15 DIAGNOSIS — M6281 Muscle weakness (generalized): Secondary | ICD-10-CM | POA: Diagnosis not present

## 2018-05-15 DIAGNOSIS — R262 Difficulty in walking, not elsewhere classified: Secondary | ICD-10-CM

## 2018-05-15 NOTE — Therapy (Addendum)
[facility] [address] [place], [place], [address] Phone: [phone]   Fax:  [phone]  Physical Therapy Treatment  Patient Details  Name: Greg Flowers MRN: [id number] Date of Birth: 07-02-1955 Referring Provider (PT): Greg BambergMark Dumonski, MD   Encounter Date: 05/15/2018  PT End of Session - 05/15/18 0942    Visit Number  4    Number of Visits  12    Date for PT Re-Evaluation  04/24/18    Authorization Type  [employer] Federal    Authorization Time Period  progress note visit 10 and KX visit 15    PT Start Time  0935    PT Stop Time  1015    PT Time Calculation (min)  40 min    Activity Tolerance  Patient tolerated treatment well    Behavior During Therapy  [facility] for tasks assessed/performed       Past Medical History:  Diagnosis Date  . Anxiety   . Calculus, kidney 2000   Sees Dr. Isabel Flowers, urology.   . Cervical spondylosis 2004   sp surgery by Dr. Danielle Flowers  . Depression   . GERD (gastroesophageal reflux disease)   . Headache(784.0)    Chronic, on vicodin 5 TID for this.   . Hepatitis C    Has occasional visits with Dr. Randa Flowers GI, failed RX in 2000.  Liver Biopsy 9/06: Minimally active hepatitis consistent with hepatitis C, minimal necroinflamtory activity grade 1, no incrased fibrosis stage 0.   . Herpes labialis   . History of kidney stones   . Hypertension   . Pneumonia   . Renal stone   . Spondylolisthesis of lumbar region   . Thalassemia    HGB 9/09 14.4 wtih MCV 72.6  . Wears glasses     Past Surgical History:  Procedure Laterality Date  . ANTERIOR CERVICAL DECOMP/DISCECTOMY FUSION N/A 11/21/2017   Procedure: ANTERIOR CERVICAL DECOMPRESSION FUSION, CERVICAL THREE-FOUR, CERVICAL FOUR-FIVE WITH INSTRUMENTATION AND ALLOGRAFT;  Surgeon: Greg Flowers, Mark, MD;  Location: [facility] OR;  Service: Orthopedics;  Laterality: N/A;  ANTERIOR CERVICAL DECOMPRESSION FUSION, CERVICAL THREE-FOUR, CERVICAL FOUR-FIVE WITH INSTRUMENTATION AND  ALLOGRAFT  . BACK SURGERY  2017   L4-L5 PLATE/SCREWS  . CERVICAL DISCECTOMY  2004   Dr Greg Flowers  . COLONOSCOPY    . LITHOTRIPSY  2004   Dr Greg Flowers  . NASAL SEPTUM SURGERY  2007  . TONSILLECTOMY      There were no vitals filed for this visit.  Subjective Assessment - 05/15/18 0939    Subjective  Pt reports shuffling of the feet since the surgery and he would like to work on picking up feet.     Currently in Pain?  Yes    Pain Score  7     Pain Orientation  Left;Anterior    Pain Descriptors / Indicators  Sore    Pain Type  Chronic pain         [facility] PT Assessment - 05/15/18 0001      Strength   Right Hip Flexion  4/5    Left Hip Flexion  4/5    Right/Left Ankle  Right    Right Ankle Dorsiflexion  4/5    Left Ankle Dorsiflexion  5/5                   [facility] Adult PT Treatment/Exercise - 05/15/18 0001      Ambulation/Gait   Ambulation/Gait  Yes    Ambulation/Gait Assistance  5: Supervision  Ambulation/Gait Assistance Details  Forward/retro walking with emphasis on heel strike.     Gait Comments  Four square stepping forward, side back , one time each way CGA   10 seconds      Knee/Hip Exercises: Aerobic   Nustep  L5 x5 min      Knee/Hip Exercises: Standing   Heel Raises  20 reps    Functional Squat  20 reps   At sink    Other Standing Knee Exercises  Functional squatting     Other Standing Knee Exercises  Toe raises 2 set x10     tandem stance and tandem walking in parallel bars with intermittent  UE assist         PT Education - 05/15/18 1245    Education Details  HEP    Person(s) Educated  Patient    Methods  Explanation;Demonstration;Verbal cues    Comprehension  Verbalized understanding;Returned demonstration       PT Short Term Goals - 04/24/18 1540      PT SHORT TERM GOAL #1   Title  He will be independent with initial HEP    Time  2    Period  Weeks    Status  New      PT SHORT TERM GOAL #2   Title  He will report pain  decreased 20-30% with normal activity in LT hip    Time  3    Period  Weeks    Status  New        PT Long Term Goals - 04/24/18 1541      PT LONG TERM GOAL #1   Title  FOTO will decreased to < 40% limited    Time  6    Period  Weeks    Status  New      PT LONG TERM GOAL #3   Title  He will report pain decreased 50% or more  in LT hipwith normal activity    Time  6    Period  Weeks    Status  New      PT LONG TERM GOAL #4   Title  He will be independent with all HEP issued    Time  6    Period  Weeks    Status  New            Plan - 05/15/18 1238    Clinical Impression Statement  [name] tolerated tx well today and requested to address his gait today because he reported his feet were dragging. Pt also demos weak right dorsiflexors and weak  hip flexors, so added toe/heel raises to HEP. Provided handout for functional squats.     PT Next Visit Plan  REview stretches HEP, add for LE strength , manual for ROM,  modalities as needed, cont. gait training, add side stepping    PT Home Exercise Plan  Lt hip stretch  ER/IR/ Flex/abduction / adduction, bridging, functional squats at counter, toe raises, heel raises    Consulted and Agree with Plan of Care  Patient       During this treatment session, the therapist was present, participating in and directing the treatment.  Patient will benefit from skilled therapeutic intervention in order to improve the following deficits and impairments:     Visit Diagnosis: Pain in left hip  Abnormal posture  Difficulty in walking, not elsewhere classified  Muscle weakness (generalized)  Muscle spasm of back  Chronic bilateral low back pain without sciatica  Stiffness of left hip, not elsewhere classified  Acute bilateral low back pain without sciatica     Problem List Patient Active Problem List   Diagnosis Date Noted  . Radiculopathy 11/21/2017  . Urinary retention 11/08/2017  . Liver fibrosis 04/30/2017  .  Thoracic radiculopathy 09/12/2016  . Spondylolisthesis at L4-L5 level 02/13/2016  . NSAID long-term use 01/09/2016  . Chronic back pain 01/09/2016  . Osteoarthritis of both knees 11/02/2011  . NECK AND BACK PAIN 11/03/2009  . FOOT PAIN, BILATERAL 09/20/2009  . DISORDER, DEPRESSIVE NEC 09/25/2006  . HERPES LABIALIS 04/04/2006  . Chronic hepatitis C without hepatic coma (HCC) 02/22/2006  . Essential hypertension 02/22/2006  . SPONDYLOSIS, CERVICAL 02/22/2006    Sherrie Mustache, SPTA 05/15/2018, 1:01 PM   Jannette Spanner, PTA 05/15/18 1:01 PM Phone: [phone] Fax: [phone]  [facility] [address] [place], [place], [address] Phone: [phone]   Fax:  [phone]  Name: Greg Flowers MRN: [id number] Date of Birth: 03/29/1956

## 2018-05-20 ENCOUNTER — Ambulatory Visit: Payer: Federal, State, Local not specified - PPO | Attending: Orthopedic Surgery

## 2018-05-20 DIAGNOSIS — M6281 Muscle weakness (generalized): Secondary | ICD-10-CM | POA: Insufficient documentation

## 2018-05-20 DIAGNOSIS — R293 Abnormal posture: Secondary | ICD-10-CM

## 2018-05-20 DIAGNOSIS — M62830 Muscle spasm of back: Secondary | ICD-10-CM | POA: Diagnosis not present

## 2018-05-20 DIAGNOSIS — R262 Difficulty in walking, not elsewhere classified: Secondary | ICD-10-CM | POA: Insufficient documentation

## 2018-05-20 DIAGNOSIS — M25552 Pain in left hip: Secondary | ICD-10-CM

## 2018-05-20 NOTE — Therapy (Signed)
[facility] [address] [place], [place], [address] Phone: [phone]   Fax:  [phone]  Physical Therapy Treatment  Patient Details  Name: Lorriane Shireerry L Russom MRN: [id number] Date of Birth: 20-May-1955 Referring Provider (PT): Estill BambergMark Dumonski, MD   Encounter Date: 05/20/2018  PT End of Session - 05/20/18 1331    Visit Number  5    Number of Visits  12    Date for PT Re-Evaluation  04/24/18    Authorization Type  [employer] Federal    Authorization Time Period  progress note visit 10 and KX visit 15    PT Start Time  0130    PT Stop Time  0215    PT Time Calculation (min)  45 min    Activity Tolerance  Patient tolerated treatment well    Behavior During Therapy  [facility] for tasks assessed/performed       Past Medical History:  Diagnosis Date  . Anxiety   . Calculus, kidney 2000   Sees Dr. Isabel CapriceGrapey, urology.   . Cervical spondylosis 2004   sp surgery by Dr. Danielle DessElsner  . Depression   . GERD (gastroesophageal reflux disease)   . Headache(784.0)    Chronic, on vicodin 5 TID for this.   . Hepatitis C    Has occasional visits with Dr. Randa EvensEdwards GI, failed RX in 2000.  Liver Biopsy 9/06: Minimally active hepatitis consistent with hepatitis C, minimal necroinflamtory activity grade 1, no incrased fibrosis stage 0.   . Herpes labialis   . History of kidney stones   . Hypertension   . Pneumonia   . Renal stone   . Spondylolisthesis of lumbar region   . Thalassemia    HGB 9/09 14.4 wtih MCV 72.6  . Wears glasses     Past Surgical History:  Procedure Laterality Date  . ANTERIOR CERVICAL DECOMP/DISCECTOMY FUSION N/A 11/21/2017   Procedure: ANTERIOR CERVICAL DECOMPRESSION FUSION, CERVICAL THREE-FOUR, CERVICAL FOUR-FIVE WITH INSTRUMENTATION AND ALLOGRAFT;  Surgeon: Estill Bambergumonski, Mark, MD;  Location: [facility] OR;  Service: Orthopedics;  Laterality: N/A;  ANTERIOR CERVICAL DECOMPRESSION FUSION, CERVICAL THREE-FOUR, CERVICAL FOUR-FIVE WITH INSTRUMENTATION AND  ALLOGRAFT  . BACK SURGERY  2017   L4-L5 PLATE/SCREWS  . CERVICAL DISCECTOMY  2004   Dr Danielle DessElsner  . COLONOSCOPY    . LITHOTRIPSY  2004   Dr Logan BoresEvans  . NASAL SEPTUM SURGERY  2007  . TONSILLECTOMY      There were no vitals filed for this visit.  Subjective Assessment - 05/20/18 1329    Subjective  Pain better due to taking medication beginning 2 weeks ago.   Does not need cane now.  still with some plantar LT foot numbness.     Pain Score  3     Pain Location  Hip    Pain Orientation  Left;Anterior    Pain Descriptors / Indicators  Sore    Pain Type  Chronic pain    Pain Onset  More than a month ago    Pain Frequency  Constant    Aggravating Factors   sit /standing    Pain Relieving Factors  meds , heat, sytretching                       [facility] Adult PT Treatment/Exercise - 05/20/18 0001      Knee/Hip Exercises: Stretches   Hip Flexor Stretch  Right;Left    Hip Flexor Stretch Limitations  with opposite knee to chest    Other Knee/Hip  Stretches  LTR x 5 RT/LT , figure 4 RT/LT x 2      Knee/Hip Exercises: Aerobic   Nustep  L5 x       Knee/Hip Exercises: Standing   Heel Raises  20 reps    Functional Squat  20 reps   hold at pulley   Other Standing Knee Exercises  flexion to extension hip with 3 pounds at ankle 2x10 then side stepping RT and LT 10 reps 3 pounds on ankles    Other Standing Knee Exercises         Knee/Hip Exercises: Supine   Bridges  20 reps    Other Supine Knee/Hip Exercises  clams green band x 20      Manual Therapy   Manual Therapy  Passive ROM;Manual Traction;Joint mobilization    Joint Mobilization  posterior glidees Gr 4 60 osscilations.     Passive ROM  ,  ER and IR adduction    Manual Traction  Lt hhip gr 4 100 osscilations.                PT Short Term Goals - 05/20/18 1341      PT SHORT TERM GOAL #1   Title  He will be independent with initial HEP    Status  Achieved      PT SHORT TERM GOAL #2   Title  He will  report pain decreased 20-30% with normal activity in LT hip    Baseline  with medication    Status  Achieved        PT Long Term Goals - 04/24/18 1541      PT LONG TERM GOAL #1   Title  FOTO will decreased to < 40% limited    Time  6    Period  Weeks    Status  New      PT LONG TERM GOAL #3   Title  He will report pain decreased 50% or more  in LT hipwith normal activity    Time  6    Period  Weeks    Status  New      PT LONG TERM GOAL #4   Title  He will be independent with all HEP issued    Time  6    Period  Weeks    Status  New            Plan - 05/20/18 1331    Clinical Impression Statement  Doing well with HEP.  Pain appears to be controlled with medication. No incr pain post.  Needs to continue strength and ROM.     Clinical Impairments Affecting Rehab Potential  previous chronic pain and second neck surgery.     PT Treatment/Interventions  Passive range of motion;Manual techniques;Patient/family education;Therapeutic exercise;Moist Heat;Iontophoresis 4mg /ml Dexamethasone;Ultrasound;Cryotherapy;Electrical Stimulation    PT Next Visit Plan  , add for LE strength , manual for ROM,  modalities as needed, cont. gait training, add side stepping    PT Home Exercise Plan  Lt hip stretch  ER/IR/ Flex/abduction / adduction, bridging, functional squats at counter, toe raises, heel raises    Consulted and Agree with Plan of Care  Patient       Patient will benefit from skilled therapeutic intervention in order to improve the following deficits and impairments:  Pain, Decreased strength, Decreased range of motion, Difficulty walking  Visit Diagnosis: Pain in left hip  Abnormal posture  Difficulty in walking, not elsewhere classified  Muscle weakness (generalized)  Muscle spasm of back     Problem List Patient Active Problem List   Diagnosis Date Noted  . Radiculopathy 11/21/2017  . Urinary retention 11/08/2017  . Liver fibrosis 04/30/2017  . Thoracic  radiculopathy 09/12/2016  . Spondylolisthesis at L4-L5 level 02/13/2016  . NSAID long-term use 01/09/2016  . Chronic back pain 01/09/2016  . Osteoarthritis of both knees 11/02/2011  . NECK AND BACK PAIN 11/03/2009  . FOOT PAIN, BILATERAL 09/20/2009  . DISORDER, DEPRESSIVE NEC 09/25/2006  . HERPES LABIALIS 04/04/2006  . Chronic hepatitis C without hepatic coma (HCC) 02/22/2006  . Essential hypertension 02/22/2006  . SPONDYLOSIS, CERVICAL 02/22/2006    Caprice Red  PT 05/20/2018, 2:10 PM  [facility] [address] [place], [place], [address] Phone: [phone]   Fax:  [phone]  Name: VASILI JANAK MRN: [id number] Date of Birth: 1956-03-03

## 2018-05-22 ENCOUNTER — Ambulatory Visit: Payer: Federal, State, Local not specified - PPO

## 2018-05-22 ENCOUNTER — Other Ambulatory Visit: Payer: Self-pay | Admitting: Internal Medicine

## 2018-05-22 DIAGNOSIS — R262 Difficulty in walking, not elsewhere classified: Secondary | ICD-10-CM

## 2018-05-22 DIAGNOSIS — M25552 Pain in left hip: Secondary | ICD-10-CM

## 2018-05-22 DIAGNOSIS — R293 Abnormal posture: Secondary | ICD-10-CM | POA: Diagnosis not present

## 2018-05-22 DIAGNOSIS — M6281 Muscle weakness (generalized): Secondary | ICD-10-CM | POA: Diagnosis not present

## 2018-05-22 DIAGNOSIS — M62830 Muscle spasm of back: Secondary | ICD-10-CM | POA: Diagnosis not present

## 2018-05-22 NOTE — Addendum Note (Signed)
Addended by: Caprice Red on: 05/22/2018 02:54 PM   Modules accepted: Orders

## 2018-05-22 NOTE — Therapy (Signed)
[facility] [address] [place], [place], [address] Phone: [phone]   Fax:  [phone]  Physical Therapy Treatment  Patient Details  Name: Greg Flowers MRN: [id number] Date of Birth: 09/16/1955 Referring Provider (PT): Estill BambergMark Dumonski, MD   Encounter Date: 05/22/2018  PT End of Session - 05/22/18 1427    Visit Number  6    Number of Visits  12    Date for PT Re-Evaluation  06/06/18    Authorization Type  [employer]    Authorization Time Period  progress note visit 10 and KX visit 15    PT Start Time  0140    PT Stop Time  0220    PT Time Calculation (min)  40 min    Activity Tolerance  Patient tolerated treatment well    Behavior During Therapy  [facility] for tasks assessed/performed       Past Medical History:  Diagnosis Date  . Anxiety   . Calculus, kidney 2000   Sees Dr. Isabel CapriceGrapey, urology.   . Cervical spondylosis 2004   sp surgery by Dr. Danielle DessElsner  . Depression   . GERD (gastroesophageal reflux disease)   . Headache(784.0)    Chronic, on vicodin 5 TID for this.   . Hepatitis C    Has occasional visits with Dr. Randa EvensEdwards GI, failed RX in 2000.  Liver Biopsy 9/06: Minimally active hepatitis consistent with hepatitis C, minimal necroinflamtory activity grade 1, no incrased fibrosis stage 0.   . Herpes labialis   . History of kidney stones   . Hypertension   . Pneumonia   . Renal stone   . Spondylolisthesis of lumbar region   . Thalassemia    HGB 9/09 14.4 wtih MCV 72.6  . Wears glasses     Past Surgical History:  Procedure Laterality Date  . ANTERIOR CERVICAL DECOMP/DISCECTOMY FUSION N/A 11/21/2017   Procedure: ANTERIOR CERVICAL DECOMPRESSION FUSION, CERVICAL THREE-FOUR, CERVICAL FOUR-FIVE WITH INSTRUMENTATION AND ALLOGRAFT;  Surgeon: Estill Bambergumonski, Mark, MD;  Location: [facility];  Service: Orthopedics;  Laterality: N/A;  ANTERIOR CERVICAL DECOMPRESSION FUSION, CERVICAL THREE-FOUR, CERVICAL FOUR-FIVE WITH INSTRUMENTATION AND  ALLOGRAFT  . BACK SURGERY  2017   L4-L5 PLATE/SCREWS  . CERVICAL DISCECTOMY  2004   Dr Danielle DessElsner  . COLONOSCOPY    . LITHOTRIPSY  2004   Dr Logan BoresEvans  . NASAL SEPTUM SURGERY  2007  . TONSILLECTOMY      There were no vitals filed for this visit.  Subjective Assessment - 05/22/18 1340    Subjective  Sore today with rainey weather.    Currently in Pain?  Yes    Pain Score  4     Pain Location  Hip    Pain Orientation  Left;Anterior    Pain Descriptors / Indicators  Sore    Pain Onset  More than a month ago    Pain Frequency  Constant    Aggravating Factors   sit stand                        [facility] Adult PT Treatment/Exercise - 05/22/18 0001      Neuro Re-ed    Neuro Re-ed Details   in bars he worked on trunk rotation stretching and movements to disassociate  trunk from pelvis then worked on  balance single leg with heel toe x 10 RT and Lt  and lateral heel touch  for balance and rotation  of each hip then worked on gait  with exagerated arm swing to facilitate rotation of trunk      Knee/Hip Exercises: Stretches   Hip Flexor Stretch  Right;Left    Hip Flexor Stretch Limitations  30 sec x 2  standing  in step stand      Knee/Hip Exercises: Aerobic   Nustep  L5 x  6 min      Knee/Hip Exercises: Machines for Strengthening   Cybex Leg Press     plates 10 reps        Knee/Hip Exercises: Standing   Heel Raises Limitations  25 reps    Lateral Step Up  Right;Left;Hand Hold: 2;Step Height: 8";20 reps    Other Standing Knee Exercises   side stepping RT and LT 15 feet each 4 pounds on ankles      Knee/Hip Exercises: Seated   Long Arc Quad  Right;Left;20 reps    Long Arc Quad Weight  4 lbs.    Long Arc Quad Limitations  hold 5 sec                PT Short Term Goals - 05/20/18 1341      PT SHORT TERM GOAL #1   Title  He will be independent with initial HEP    Status  Achieved      PT SHORT TERM GOAL #2   Title  He will report pain decreased 20-30% with  normal activity in LT hip    Baseline  with medication    Status  Achieved        PT Long Term Goals - 04/24/18 1541      PT LONG TERM GOAL #1   Title  FOTO will decreased to < 40% limited    Time  6    Period  Weeks    Status  New      PT LONG TERM GOAL #3   Title  He will report pain decreased 50% or more  in LT hipwith normal activity    Time  6    Period  Weeks    Status  New      PT LONG TERM GOAL #4   Title  He will be independent with all HEP issued    Time  6    Period  Weeks    Status  New            Plan - 05/22/18 1429    Clinical Impression Statement  He  reports  feeling good post session reporting a good workout.   Continue strength /balance and ROM    PT Treatment/Interventions  Passive range of motion;Manual techniques;Patient/family education;Therapeutic exercise;Moist Heat;Iontophoresis 4mg /ml Dexamethasone;Ultrasound;Cryotherapy;Electrical Stimulation    PT Next Visit Plan  , add for LE strength to HEP , manual for ROM,  modalities as needed, cont. gait training, add side stepping    PT Home Exercise Plan  Lt hip stretch  ER/IR/ Flex/abduction / adduction, bridging, functional squats at counter, toe raises, heel raises    Consulted and Agree with Plan of Care  Patient       Patient will benefit from skilled therapeutic intervention in order to improve the following deficits and impairments:  Pain, Decreased strength, Decreased range of motion, Difficulty walking  Visit Diagnosis: Pain in left hip  Abnormal posture  Difficulty in walking, not elsewhere classified  Muscle weakness (generalized)  Muscle spasm of back     Problem List Patient Active Problem List   Diagnosis Date Noted  . Urinary retention 11/08/2017  .  Liver fibrosis 04/30/2017  . NSAID long-term use 01/09/2016  . Chronic back pain secondary to DJD of cervical, thoracic and lumbar spine and disc herniation 01/09/2016  . Osteoarthritis of both knees 11/02/2011  . NECK  AND BACK PAIN 11/03/2009  . FOOT PAIN, BILATERAL 09/20/2009  . DISORDER, DEPRESSIVE NEC 09/25/2006  . HERPES LABIALIS 04/04/2006  . Chronic hepatitis C without hepatic coma (HCC) 02/22/2006  . Essential hypertension 02/22/2006    Caprice RedChasse, Ercilia Bettinger M  PT 05/22/2018, 2:50 PM  [facility] [address] [place], [place], [address] Phone: [phone]   Fax:  [phone]  Name: Greg Flowers MRN: [id number] Date of Birth: [birth year]

## 2018-05-27 ENCOUNTER — Ambulatory Visit: Payer: Federal, State, Local not specified - PPO

## 2018-05-27 DIAGNOSIS — R262 Difficulty in walking, not elsewhere classified: Secondary | ICD-10-CM | POA: Diagnosis not present

## 2018-05-27 DIAGNOSIS — M6281 Muscle weakness (generalized): Secondary | ICD-10-CM

## 2018-05-27 DIAGNOSIS — R293 Abnormal posture: Secondary | ICD-10-CM

## 2018-05-27 DIAGNOSIS — M25552 Pain in left hip: Secondary | ICD-10-CM

## 2018-05-27 DIAGNOSIS — M62830 Muscle spasm of back: Secondary | ICD-10-CM | POA: Diagnosis not present

## 2018-05-27 NOTE — Therapy (Signed)
[place], [place], [address] Phone: [phone]   Fax:  [phone]  Physical Therapy Treatment  Patient Details  Name: Greg Flowers MRN: [id number] Date of Birth: 25-Jul-1955 Referring Provider (PT): Phylliss Bob, MD   Encounter Date: 05/27/2018  PT End of Session - 05/27/18 1339    Visit Number  7    Number of Visits  12    Date for PT Re-Evaluation  06/06/18    Authorization Type  [employer] Federal    Authorization Time Period  progress note visit 10 and KX visit 15    PT Start Time  0140   pt late   PT Stop Time  0220    PT Time Calculation (min)  40 min    Activity Tolerance  Patient tolerated treatment well    Behavior During Therapy  [facility] for tasks assessed/performed       Past Medical History:  Diagnosis Date  . Anxiety   . Calculus, kidney 2000   Sees Dr. Risa Grill, urology.   . Cervical spondylosis 2004   sp surgery by Dr. Ellene Route  . Depression   . GERD (gastroesophageal reflux disease)   . Headache(784.0)    Chronic, on vicodin 5 TID for this.   . Hepatitis C    Has occasional visits with Dr. Oletta Lamas GI, failed RX in 2000.  Liver Biopsy 9/06: Minimally active hepatitis consistent with hepatitis C, minimal necroinflamtory activity grade 1, no incrased fibrosis stage 0.   . Herpes labialis   . History of kidney stones   . Hypertension   . Pneumonia   . Renal stone   . Spondylolisthesis of lumbar region   . Thalassemia    HGB 9/09 14.4 wtih MCV 72.6  . Wears glasses     Past Surgical History:  Procedure Laterality Date  . ANTERIOR CERVICAL DECOMP/DISCECTOMY FUSION N/A 11/21/2017   Procedure: ANTERIOR CERVICAL DECOMPRESSION FUSION, CERVICAL THREE-FOUR, CERVICAL FOUR-FIVE WITH INSTRUMENTATION AND ALLOGRAFT;  Surgeon: Phylliss Bob, MD;  Location: [place];  Service: Orthopedics;  Laterality: N/A;  ANTERIOR CERVICAL DECOMPRESSION FUSION, CERVICAL THREE-FOUR, CERVICAL FOUR-FIVE WITH  INSTRUMENTATION AND ALLOGRAFT  . BACK SURGERY  2017   L4-L5 PLATE/SCREWS  . CERVICAL DISCECTOMY  2004   Dr Ellene Route  . COLONOSCOPY    . LITHOTRIPSY  2004   Dr Amalia Hailey  . NASAL SEPTUM SURGERY  2007  . TONSILLECTOMY      There were no vitals filed for this visit.  Subjective Assessment - 05/27/18 1345    Subjective  No complaints . 1-2 back pain and LT hp but on off today    Currently in Pain?  Yes    Pain Score  1     Pain Location  Hip    Pain Orientation  Left;Anterior    Pain Descriptors / Indicators  Sore    Pain Type  Chronic pain    Pain Onset  More than a month ago    Pain Frequency  Constant    Aggravating Factors   sit to stand    Pain Relieving Factors  meds , heat , stretching                       [facility] Adult PT Treatment/Exercise - 05/27/18 0001      Knee/Hip Exercises: Aerobic   Nustep  L6 6.5 min      Knee/Hip Exercises: Standing   Lateral Step Up  Right;Left;Hand Hold: 2;Step Height:  8";20 reps    Wall Squat  15 reps;5 seconds      Knee/Hip Exercises: Seated   Long Arc Quad  Right;Left;20 reps    Long Arc Quad Weight  5 lbs.    Long Arc Quad Limitations  hold 5 sec       Knee/Hip Exercises: Supine   Bridges  10 reps    Single Leg Bridge  Right;Left;10 reps             PT Education - 05/27/18 1420    Education Details  HEP with band for hips    Person(s) Educated  Patient    Methods  Explanation;Tactile cues;Verbal cues;Handout    Comprehension  Returned demonstration;Verbalized understanding       PT Short Term Goals - 05/20/18 1341      PT SHORT TERM GOAL #1   Title  He will be independent with initial HEP    Status  Achieved      PT SHORT TERM GOAL #2   Title  He will report pain decreased 20-30% with normal activity in LT hip    Baseline  with medication    Status  Achieved        PT Long Term Goals - 05/27/18 1431      PT LONG TERM GOAL #1   Title  FOTO will decreased to < 40% limited    Status  On-going       PT LONG TERM GOAL #2   Title  He will tolerated gym equipment exercise without incr back pain to tolerate joining gym    Time  6    Period  Weeks    Status  On-going      PT LONG TERM GOAL #3   Title  He will report pain decreased 50% or more  in LT hip with normal activity    Baseline  improved but pain variable    Status  Partially Met      PT LONG TERM GOAL #4   Title  He will be independent with all HEP issued    Status  On-going            Plan - 05/27/18 1344    Clinical Impression Statement  No significant complaints today. Questions about his hip and we reviewed anatomy on model but without xray report I told him i had no reference to be specific.   He is doing all exercises well and  may only need 3-4 more sessions then discharge. His walking is much better when he exagerates  his arm swing.     PT Treatment/Interventions  Passive range of motion;Manual techniques;Patient/family education;Therapeutic exercise;Moist Heat;Iontophoresis 67m/ml Dexamethasone;Ultrasound;Cryotherapy;Electrical Stimulation    PT Next Visit Plan  ,review added  HEP , manual for ROM,  modalities as needed, cont. gait training,                   FOTO    PT Home Exercise Plan  Lt hip stretch  ER/IR/ Flex/abduction / adduction, bridging, functional squats at counter, toe raises, heel raises, sit to stand .   side steps with band, side clam with band , single leg bridge    Consulted and Agree with Plan of Care  Patient       Patient will benefit from skilled therapeutic intervention in order to improve the following deficits and impairments:  Pain, Decreased strength, Decreased range of motion, Difficulty walking  Visit Diagnosis: Pain in left hip  Abnormal posture  Difficulty in walking, not elsewhere classified  Muscle weakness (generalized)     Problem List Patient Active Problem List   Diagnosis Date Noted  . Urinary retention 11/08/2017  . Liver fibrosis 04/30/2017  . NSAID  long-term use 01/09/2016  . Chronic back pain secondary to DJD of cervical, thoracic and lumbar spine and disc herniation 01/09/2016  . Osteoarthritis of both knees 11/02/2011  . NECK AND BACK PAIN 11/03/2009  . FOOT PAIN, BILATERAL 09/20/2009  . DISORDER, DEPRESSIVE NEC 09/25/2006  . HERPES LABIALIS 04/04/2006  . Chronic hepatitis C without hepatic coma ([place]) 02/22/2006  . Essential hypertension 02/22/2006    [name]  PT 05/27/2018, 2:34 PM  [facility] [address] [place], [place], [address] Phone: [phone]   Fax:  [phone]  Name: LEONIDUS ROWAND MRN: [id number] Date of Birth: 1955-10-14

## 2018-05-27 NOTE — Patient Instructions (Signed)
Single leg bridge,   Side steps with band and clam with band  X 10-25 reps  Daily if able

## 2018-05-29 ENCOUNTER — Ambulatory Visit: Payer: Federal, State, Local not specified - PPO

## 2018-05-29 DIAGNOSIS — M25552 Pain in left hip: Secondary | ICD-10-CM | POA: Diagnosis not present

## 2018-05-29 DIAGNOSIS — M62830 Muscle spasm of back: Secondary | ICD-10-CM | POA: Diagnosis not present

## 2018-05-29 DIAGNOSIS — R262 Difficulty in walking, not elsewhere classified: Secondary | ICD-10-CM

## 2018-05-29 DIAGNOSIS — M6281 Muscle weakness (generalized): Secondary | ICD-10-CM | POA: Diagnosis not present

## 2018-05-29 DIAGNOSIS — R293 Abnormal posture: Secondary | ICD-10-CM

## 2018-05-29 NOTE — Therapy (Addendum)
[place], [place], [address] Phone: [phone]   Fax:  [phone]  Physical Therapy Treatment  Patient Details  Name: Greg Flowers MRN: [id number] Date of Birth: 04/21/1955 Referring Provider (PT): Phylliss Bob, MD   Encounter Date: 05/29/2018  PT End of Session - 05/29/18 1340    Visit Number  8    Number of Visits  12    Date for PT Re-Evaluation  06/13/18    Authorization Type  [employer] Federal    Authorization Time Period  progress note visit 10 and KX visit 15    PT Start Time  0140    PT Stop Time  0225   heat for final 12 min as he was late 10 min   PT Time Calculation (min)  45 min    Activity Tolerance  Patient tolerated treatment well    Behavior During Therapy  [facility] for tasks assessed/performed       Past Medical History:  Diagnosis Date  . Anxiety   . Calculus, kidney 2000   Sees Dr. Risa Grill, urology.   . Cervical spondylosis 2004   sp surgery by Dr. Ellene Route  . Depression   . GERD (gastroesophageal reflux disease)   . Headache(784.0)    Chronic, on vicodin 5 TID for this.   . Hepatitis C    Has occasional visits with Dr. Oletta Lamas GI, failed RX in 2000.  Liver Biopsy 9/06: Minimally active hepatitis consistent with hepatitis C, minimal necroinflamtory activity grade 1, no incrased fibrosis stage 0.   . Herpes labialis   . History of kidney stones   . Hypertension   . Pneumonia   . Renal stone   . Spondylolisthesis of lumbar region   . Thalassemia    HGB 9/09 14.4 wtih MCV 72.6  . Wears glasses     Past Surgical History:  Procedure Laterality Date  . ANTERIOR CERVICAL DECOMP/DISCECTOMY FUSION N/A 11/21/2017   Procedure: ANTERIOR CERVICAL DECOMPRESSION FUSION, CERVICAL THREE-FOUR, CERVICAL FOUR-FIVE WITH INSTRUMENTATION AND ALLOGRAFT;  Surgeon: Phylliss Bob, MD;  Location: [place];  Service: Orthopedics;  Laterality: N/A;  ANTERIOR CERVICAL DECOMPRESSION FUSION, CERVICAL THREE-FOUR,  CERVICAL FOUR-FIVE WITH INSTRUMENTATION AND ALLOGRAFT  . BACK SURGERY  2017   L4-L5 PLATE/SCREWS  . CERVICAL DISCECTOMY  2004   Dr Ellene Route  . COLONOSCOPY    . LITHOTRIPSY  2004   Dr Amalia Hailey  . NASAL SEPTUM SURGERY  2007  . TONSILLECTOMY      There were no vitals filed for this visit.  Subjective Assessment - 05/29/18 1414    Subjective  LT hip pain 9/10 LT anterior and lateral hip    Pain Score  9     Pain Location  Hip    Pain Orientation  Left;Anterior;Lateral    Pain Descriptors / Indicators  Throbbing;Aching;Sore    Pain Type  Chronic pain    Pain Onset  More than a month ago    Pain Frequency  Constant    Aggravating Factors   all activity on fet today    Pain Relieving Factors  med , heat                       [facility] Adult PT Treatment/Exercise - 05/29/18 0001      Knee/Hip Exercises: Stretches   Hip Flexor Stretch  Right;Left    Hip Flexor Stretch Limitations  30 sec x 2  standing  LT knee on mat  Knee/Hip Exercises: Aerobic   Nustep  L6   5 min      Knee/Hip Exercises: Standing   Wall Squat  15 reps;5 seconds      Knee/Hip Exercises: Seated   Long Arc Quad  Right;Left;20 reps    Long Arc Quad Weight  7 lbs.    Long Arc Quad Limitations  hold 5 sec       Knee/Hip Exercises: Supine   Bridges  15 reps    Single Leg Bridge  Right;Left;10 reps      Modalities   Modalities  Moist Heat      Moist Heat Therapy   Number Minutes Moist Heat  12 Minutes    Moist Heat Location  Hip   Lt  RT side lye     Manual Therapy   Myofascial Release  Glut Medius  TFL and ITB with roller and direct pressure to TFl and mediusto increase range and decrease pain    Passive ROM  ,  ER and IR ,adduction, exte  hip  LT and LT quad  30-60 sec               PT Short Term Goals - 05/20/18 1341      PT SHORT TERM GOAL #1   Title  He will be independent with initial HEP    Status  Achieved      PT SHORT TERM GOAL #2   Title  He will report pain  decreased 20-30% with normal activity in LT hip    Baseline  with medication    Status  Achieved        PT Long Term Goals - 05/27/18 1431      PT LONG TERM GOAL #1   Title  FOTO will decreased to < 40% limited    Status  On-going      PT LONG TERM GOAL #2   Title  He will tolerated gym equipment exercise without incr back pain to tolerate joining gym    Time  6    Period  Weeks    Status  On-going      PT LONG TERM GOAL #3   Title  He will report pain decreased 50% or more  in LT hip with normal activity    Baseline  improved but pain variable    Status  Partially Met      PT LONG TERM GOAL #4   Title  He will be independent with all HEP issued    Status  On-going            Plan - 05/29/18 1414    Clinical Impression Statement  He reported hip pain at 9/10 though was able to walk  without exagerted  limp and  though winced with palpation did not move  twitch or try to avoid pressure.  He reported feeling much better after  heat at end of session. He  was most painful in muscles and not on the rochantor so bursitis though possible is less likely.     PT Treatment/Interventions  Passive range of motion;Manual techniques;Patient/family education;Therapeutic exercise;Moist Heat;Iontophoresis 2m/ml Dexamethasone;Ultrasound;Cryotherapy;Electrical Stimulation    PT Next Visit Plan  ,review added  HEP , manual for ROM,  modalities as needed, cont. gait training,                   FOTO    PT Home Exercise Plan  Lt hip stretch  ER/IR/ Flex/abduction / adduction, bridging, functional squats  at counter, toe raises, heel raises, sit to stand .   side steps with band, side clam with band , single leg bridge       Patient will benefit from skilled therapeutic intervention in order to improve the following deficits and impairments:  Pain, Decreased strength, Decreased range of motion, Difficulty walking  Visit Diagnosis: Pain in left hip  Abnormal posture  Difficulty in walking,  not elsewhere classified  Muscle weakness (generalized)     Problem List Patient Active Problem List   Diagnosis Date Noted  . Urinary retention 11/08/2017  . Liver fibrosis 04/30/2017  . NSAID long-term use 01/09/2016  . Chronic back pain secondary to DJD of cervical, thoracic and lumbar spine and disc herniation 01/09/2016  . Osteoarthritis of both knees 11/02/2011  . NECK AND BACK PAIN 11/03/2009  . FOOT PAIN, BILATERAL 09/20/2009  . DISORDER, DEPRESSIVE NEC 09/25/2006  . HERPES LABIALIS 04/04/2006  . Chronic hepatitis C without hepatic coma ([place]) 02/22/2006  . Essential hypertension 02/22/2006    [name]  PT 05/29/2018, 2:35 PM  [facility] [address] [place], [place], [address] Phone: [phone]   Fax:  [phone]  Name: JOHSUA SHEVLIN MRN: [id number] Date of Birth: 09-29-1955

## 2018-05-29 NOTE — Addendum Note (Signed)
Addended by: Caprice Red on: 05/29/2018 02:38 PM   Modules accepted: Orders

## 2018-06-02 IMAGING — MR MR LUMBAR SPINE W/O CM
4 of 5 series · 18 of 48 positions shown · non-contrast
Comparison: None.

CLINICAL DATA: Severe low back pain radiating to both feet

EXAM:
MRI LUMBAR SPINE WITHOUT CONTRAST
TECHNIQUE: Multiplanar, multisequence MR imaging of the lumbar spine was
performed. No intravenous contrast was administered.

[Series 3: T2 · sagittal · 4.0mm · 0.55mm/px · 6 of 13 slices shown (1 of 2)]
[im 1/13]
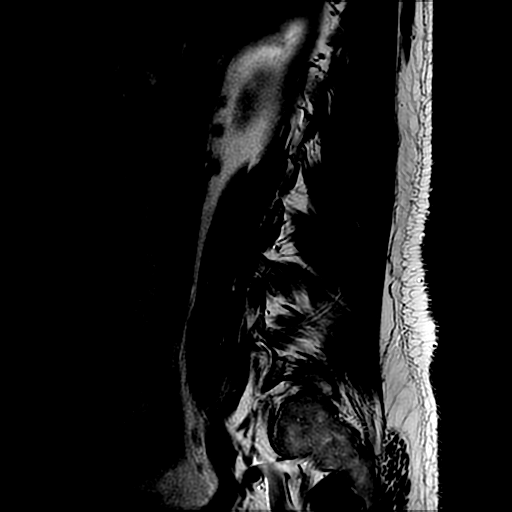
[im 3/13]
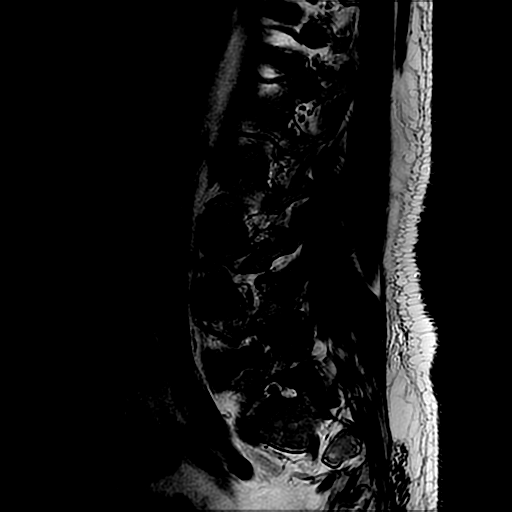
[im 5/13]
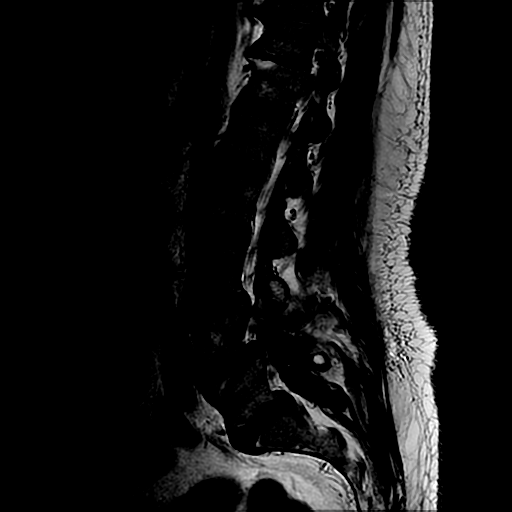
[im 8/13]
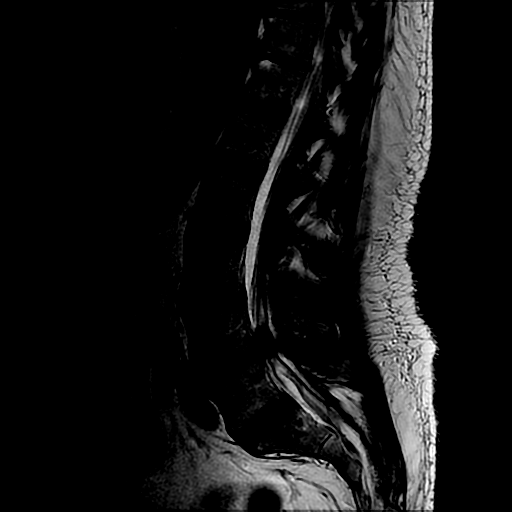
[im 10/13]
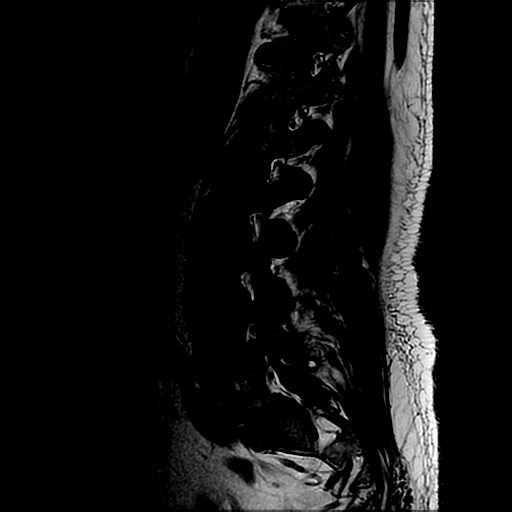
[im 13/13]
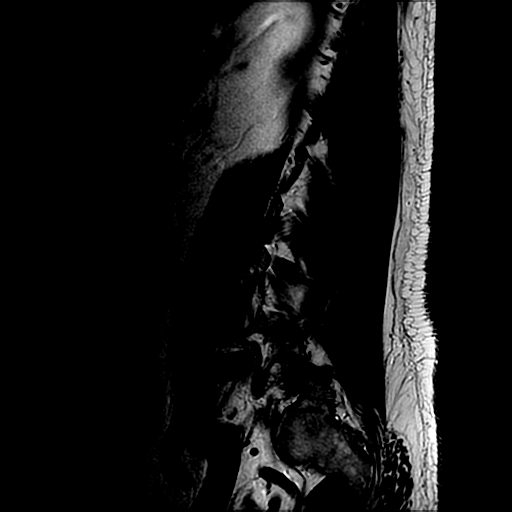

[Series 4: T1 · sagittal · 4.0mm · 0.55mm/px · 3 of 13 slices shown (1 of 2)]
[im 3/13]
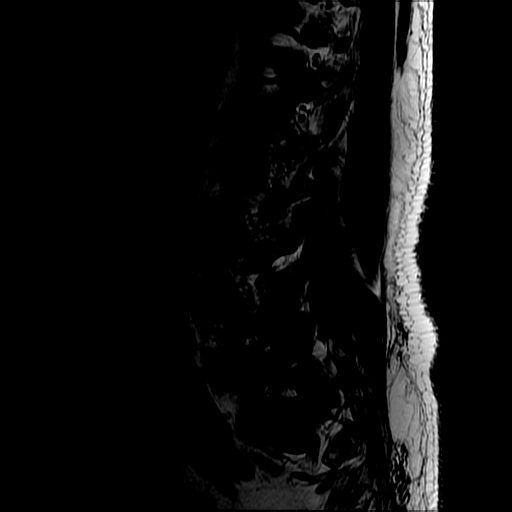
[im 8/13]
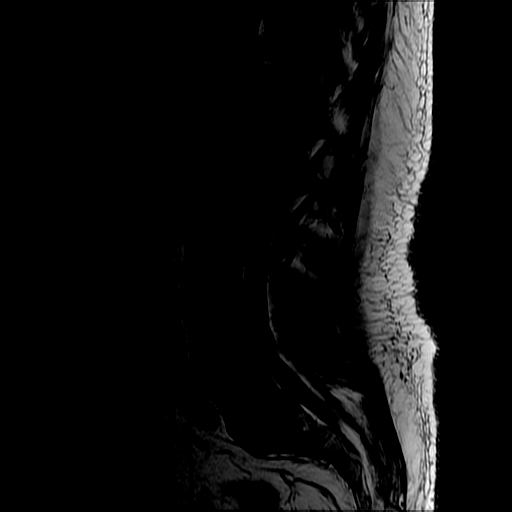
[im 13/13]
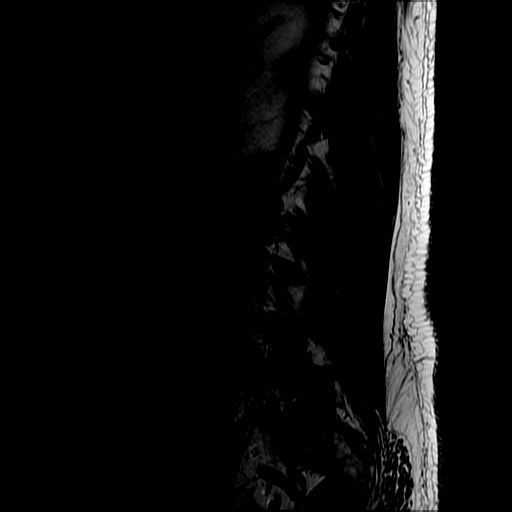

[Series 6: T2 · axial · 4.0mm · 0.39mm/px · z∈[-117,+51]mm · 6 of 34 slices shown (2 of 2)]
[im 1/34]
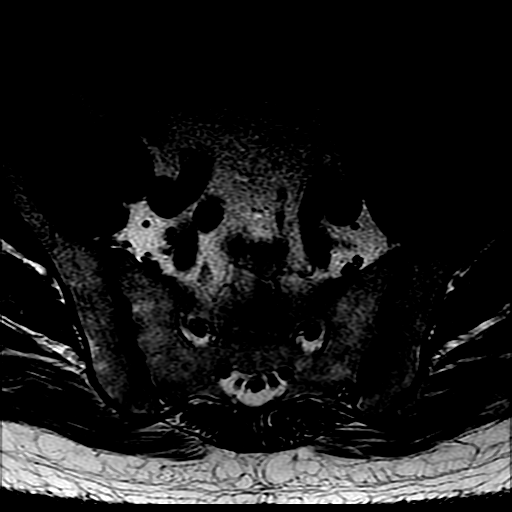
[im 5/34]
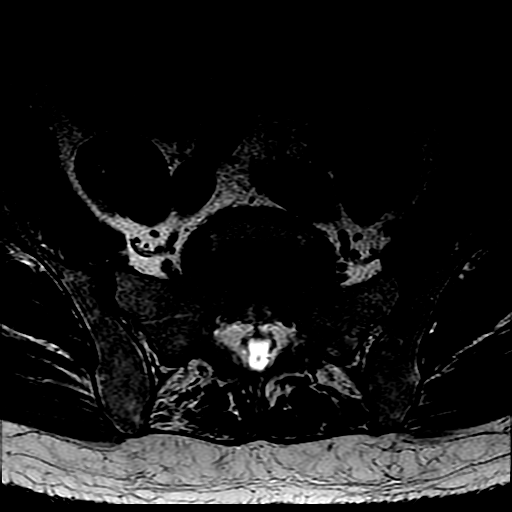
[im 10/34]
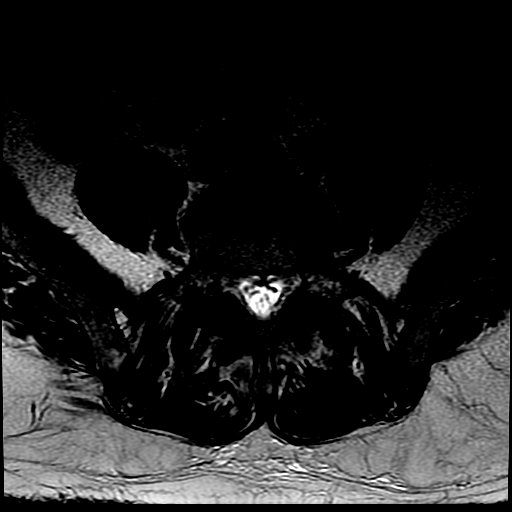
[im 15/34]
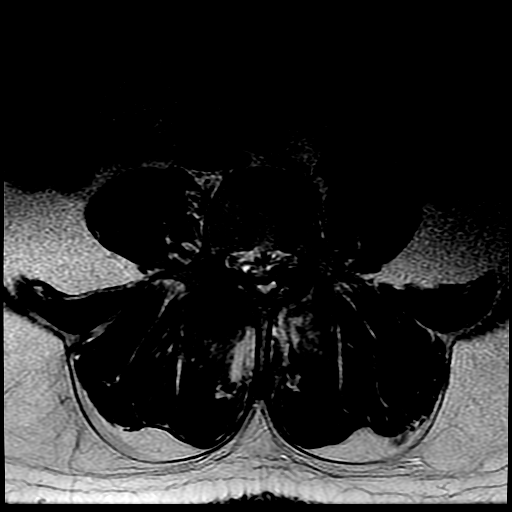
[im 17/34]
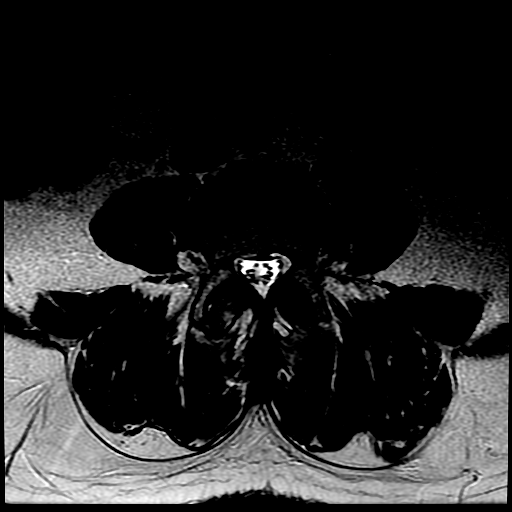
[im 29/34]
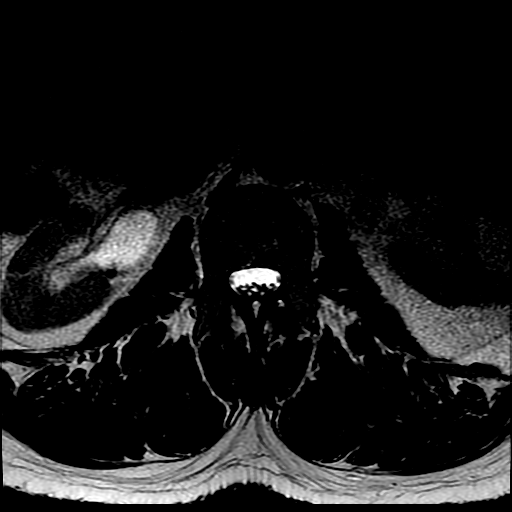

[Series 7: T1 · axial · 4.0mm · 0.39mm/px · z∈[-97,+51]mm · 3 of 34 slices shown (2 of 2)]
[im 5/34]
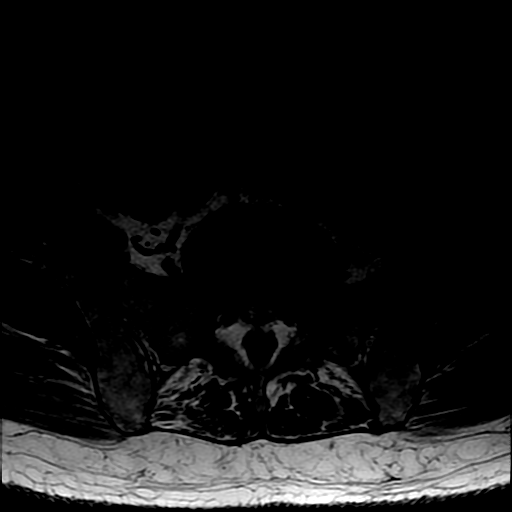
[im 17/34]
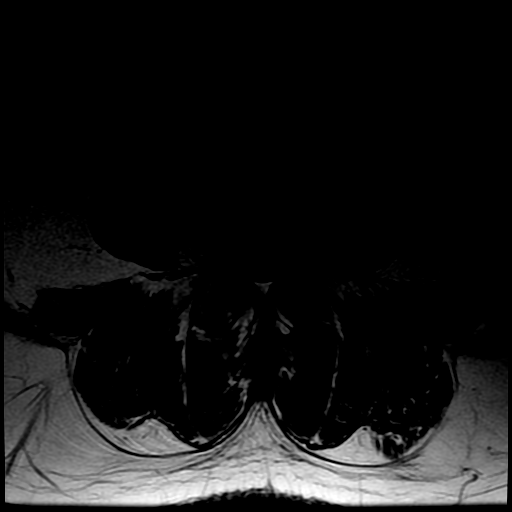
[im 29/34]
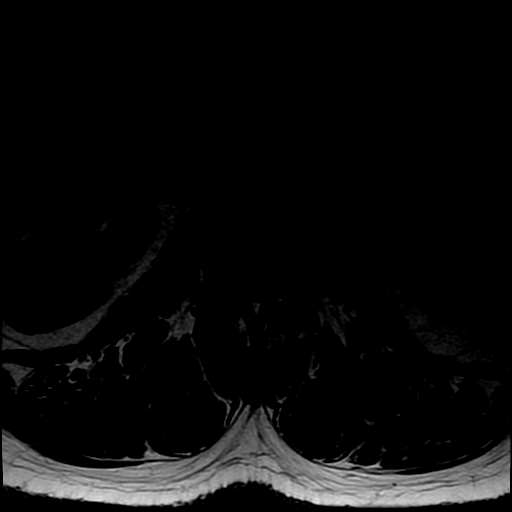

[18 of 48 positions shown; findings below may reference images not displayed]

FINDINGS: Segmentation: Normal. The lowest disc space is considered to be
L5-S1.

Alignment:  Grade 1 anterolisthesis at L4-L5.

Vertebrae: No acute compression fracture, facet edema or focal
marrow lesion.

Conus medullaris: Extends to the L2 level and appears normal.

Paraspinal and other soft tissues: Incompletely visualized left
renal cyst. Otherwise unremarkable.

Disc levels:

T10-T11: Evaluated on sagittal images only. No spinal canal or
neural foraminal stenosis.

T11-T12: Evaluated on sagittal images only. No spinal canal or
neural foraminal stenosis.

T12-L1: Normal disc space and facet joints. No spinal canal
stenosis. No neuroforaminal stenosis.

L1-L2: Normal disc space and facet joints. No spinal canal stenosis.
No neuroforaminal stenosis.

L2-L3: Normal disc space and facet joints. No spinal canal stenosis.
No neuroforaminal stenosis.

L3-L4: Disc desiccation and diffuse small bulge. Severe bilateral
facet hypertrophy. No spinal canal stenosis. No neuroforaminal
stenosis.

L4-L5: Disc desiccation and diffuse bulge with superimposed central
extrusion with mild cranial migration. Bilateral severe facet
hypertrophy with fluid in the facet joints. Small areas of fluid
adjacent to the lateral aspects of the facet joints, likely synovial
cysts. Severe spinal canal stenosis. Severe bilateral neuroforaminal
stenosis.

L5-S1: Severe bilateral facet arthrosis. No spinal canal stenosis.
No neuroforaminal stenosis.
IMPRESSION: 1. Multifactorial severe spinal canal stenosis and severe bilateral
foraminal stenosis at L4-L5.
2. Severe facet arthrosis at L3-S1 with fluid in the facet joints at
L4-5 that may indicate a degree of segmental instability.

## 2018-06-10 ENCOUNTER — Ambulatory Visit: Payer: Federal, State, Local not specified - PPO

## 2018-06-10 DIAGNOSIS — R262 Difficulty in walking, not elsewhere classified: Secondary | ICD-10-CM

## 2018-06-10 DIAGNOSIS — M25552 Pain in left hip: Secondary | ICD-10-CM | POA: Diagnosis not present

## 2018-06-10 DIAGNOSIS — M62830 Muscle spasm of back: Secondary | ICD-10-CM | POA: Diagnosis not present

## 2018-06-10 DIAGNOSIS — R293 Abnormal posture: Secondary | ICD-10-CM

## 2018-06-10 DIAGNOSIS — M6281 Muscle weakness (generalized): Secondary | ICD-10-CM | POA: Diagnosis not present

## 2018-06-10 NOTE — Therapy (Signed)
[facility] Rehabilitation [facility] [address] [place], [place], [address] Phone: [phone]   Fax:  [phone]  Physical Therapy Treatment  Patient Details  Name: Greg Flowers MRN: [id number] Date of Birth: [birth year] Referring Provider (PT): Estill Bamberg, MD   Encounter Date: 06/10/2018  PT End of Session - 06/10/18 1339    Visit Number  9    Number of Visits  12    Date for PT Re-Evaluation  06/13/18    Authorization Type  [employer] Federal    Authorization Time Period  progress note visit 10 and KX visit 15    PT Start Time  0127    PT Stop Time  0220    PT Time Calculation (min)  53 min    Activity Tolerance  Patient tolerated treatment well    Behavior During Therapy  [facility] for tasks assessed/performed       Past Medical History:  Diagnosis Date  . Anxiety   . Calculus, kidney 2000   Sees Dr. Isabel Caprice, urology.   . Cervical spondylosis 2004   sp surgery by Dr. Danielle Dess  . Depression   . GERD (gastroesophageal reflux disease)   . Headache(784.0)    Chronic, on vicodin 5 TID for this.   . Hepatitis C    Has occasional visits with Dr. Randa Evens GI, failed RX in 2000.  Liver Biopsy 9/06: Minimally active hepatitis consistent with hepatitis C, minimal necroinflamtory activity grade 1, no incrased fibrosis stage 0.   . Herpes labialis   . History of kidney stones   . Hypertension   . Pneumonia   . Renal stone   . Spondylolisthesis of lumbar region   . Thalassemia    HGB 9/09 14.4 wtih MCV 72.6  . Wears glasses     Past Surgical History:  Procedure Laterality Date  . ANTERIOR CERVICAL DECOMP/DISCECTOMY FUSION N/A 11/21/2017   Procedure: ANTERIOR CERVICAL DECOMPRESSION FUSION, CERVICAL THREE-FOUR, CERVICAL FOUR-FIVE WITH INSTRUMENTATION AND ALLOGRAFT;  Surgeon: Estill Bamberg, MD;  Location: [facility] OR;  Service: Orthopedics;  Laterality: N/A;  ANTERIOR CERVICAL DECOMPRESSION FUSION, CERVICAL THREE-FOUR, CERVICAL FOUR-FIVE WITH INSTRUMENTATION AND  ALLOGRAFT  . BACK SURGERY  2017   L4-L5 PLATE/SCREWS  . CERVICAL DISCECTOMY  2004   Dr Danielle Dess  . COLONOSCOPY    . LITHOTRIPSY  2004   Dr Logan Bores  . NASAL SEPTUM SURGERY  2007  . TONSILLECTOMY      There were no vitals filed for this visit.  Subjective Assessment - 06/10/18 1331    Subjective  Lt hip pain about a 5 today. Reports incr pain  after doing  yard work.   Lying in Lt side increased pain in LT hip /thigh.     Pain Score  5     Pain Location  Hip    Pain Orientation  Left;Anterior;Lateral    Pain Descriptors / Indicators  Aching;Throbbing    Pain Onset  More than a month ago    Pain Frequency  Constant    Aggravating Factors   active on feet, lying on Lt side.     Pain Relieving Factors  heat , meds                       [facility] Adult PT Treatment/Exercise - 06/10/18 0001      Knee/Hip Exercises: Stretches   Hip Flexor Stretch  Right;Left    Hip Flexor Stretch Limitations  30 sec x 2  standing  LT knee on  mat      Knee/Hip Exercises: Aerobic   Nustep  L6  6 min      Knee/Hip Exercises: Standing   Wall Squat  20 reps;5 seconds    Other Standing Knee Exercises   side stepping RT and LT 45 feet each 7 pounds on ankles      Knee/Hip Exercises: Seated   Long Arc Quad  Right;Left;20 reps    Long Arc Quad Weight  7 lbs.    Long Arc Quad Limitations  hold 5 sec       Knee/Hip Exercises: Supine   Bridges  20 reps    Single Leg Bridge  Right;Left;10 reps      Knee/Hip Exercises: Sidelying   Clams  RT/LT 20 reps clam and reverse clam      Moist Heat Therapy   Number Minutes Moist Heat  12 Minutes    Moist Heat Location  Hip   LT               PT Short Term Goals - 05/20/18 1341      PT SHORT TERM GOAL #1   Title  He will be independent with initial HEP    Status  Achieved      PT SHORT TERM GOAL #2   Title  He will report pain decreased 20-30% with normal activity in LT hip    Baseline  with medication    Status  Achieved         PT Long Term Goals - 06/10/18 1430      PT LONG TERM GOAL #1   Title  FOTO will decreased to < 40% limited    Status  On-going      PT LONG TERM GOAL #2   Title  He will tolerated gym equipment exercise without incr back pain to tolerate joining gym    Baseline  need to do equipment in last 2-3 sessions      PT LONG TERM GOAL #3   Title  He will report pain decreased 50% or more  in LT hip with normal activity    Baseline  improved but pain variable and can be high    Status  On-going      PT LONG TERM GOAL #4   Title  He will be independent with all HEP issued    Status  On-going            Plan - 06/10/18 1340    Clinical Impression Statement  He continues to have constant Lt hip and thigh pain. Activity on feet increase pain in hip , moving leg can give sharp pains.   continues with stiffness .   Overall he is doing much better ., His walking is betterand ovefrall Lt hip papin appears to be the limiting factor with function. i ahve asked him to pursue assessment of TL hip. Will finish POC.        PT Treatment/Interventions  Passive range of motion;Manual techniques;Patient/family education;Therapeutic exercise;Moist Heat;Iontophoresis 4mg /ml Dexamethasone;Ultrasound;Cryotherapy;Electrical Stimulation    PT Next Visit Plan  ,review added  HEP , manual for ROM,  modalities as needed, cont. gait training,                       PT Home Exercise Plan  Lt hip stretch  ER/IR/ Flex/abduction / adduction, bridging, functional squats at counter, toe raises, heel raises, sit to stand .   side steps with band, side clam with band ,  single leg bridge       Patient will benefit from skilled therapeutic intervention in order to improve the following deficits and impairments:  Pain, Decreased strength, Decreased range of motion, Difficulty walking  Visit Diagnosis: Pain in left hip  Abnormal posture  Difficulty in walking, not elsewhere classified  Muscle weakness  (generalized)     Problem List Patient Active Problem List   Diagnosis Date Noted  . Urinary retention 11/08/2017  . Liver fibrosis 04/30/2017  . NSAID long-term use 01/09/2016  . Chronic back pain secondary to DJD of cervical, thoracic and lumbar spine and disc herniation 01/09/2016  . Osteoarthritis of both knees 11/02/2011  . NECK AND BACK PAIN 11/03/2009  . FOOT PAIN, BILATERAL 09/20/2009  . DISORDER, DEPRESSIVE NEC 09/25/2006  . HERPES LABIALIS 04/04/2006  . Chronic hepatitis C without hepatic coma (HCC) 02/22/2006  . Essential hypertension 02/22/2006    Caprice Red  PT 06/10/2018, 2:32 PM  [facility] [address] [place], [place], [address] Phone: [phone]   Fax:  [phone]  Name: Greg Flowers MRN: [id number] Date of Birth: [birth year]

## 2018-06-12 ENCOUNTER — Ambulatory Visit: Payer: Federal, State, Local not specified - PPO

## 2018-06-12 ENCOUNTER — Encounter: Payer: Self-pay | Admitting: Internal Medicine

## 2018-06-12 DIAGNOSIS — M6281 Muscle weakness (generalized): Secondary | ICD-10-CM

## 2018-06-12 DIAGNOSIS — R262 Difficulty in walking, not elsewhere classified: Secondary | ICD-10-CM

## 2018-06-12 DIAGNOSIS — R293 Abnormal posture: Secondary | ICD-10-CM | POA: Diagnosis not present

## 2018-06-12 DIAGNOSIS — M25552 Pain in left hip: Secondary | ICD-10-CM | POA: Diagnosis not present

## 2018-06-12 DIAGNOSIS — M62830 Muscle spasm of back: Secondary | ICD-10-CM | POA: Diagnosis not present

## 2018-06-12 NOTE — Therapy (Addendum)
[facility] Outpatient Rehabilitation [facility] [address] [place], [place], [address] Phone: [phone]   Fax:  [phone]  Physical Therapy Treatment  Patient Details  Name: Greg Flowers MRN: [id number] Date of Birth: 03-Oct-1955 Referring Provider (PT): Estill Bamberg, MD  Progress Note Reporting Period   04/24/18 to 06/12/18  See note below for Objective Data and Assessment of Progress/Goals.      Encounter Date: 06/12/2018  PT End of Session - 06/12/18 1419    Visit Number  10    Number of Visits  12    Date for PT Re-Evaluation  06/13/18    Authorization Type  [employer] Federal    Authorization Time Period  progress note visit 10 and KX visit 15    PT Start Time  0218    PT Stop Time  0300    PT Time Calculation (min)  42 min    Activity Tolerance  Patient tolerated treatment well    Behavior During Therapy  WFL for tasks assessed/performed       Past Medical History:  Diagnosis Date  . Anxiety   . Calculus, kidney 2000   Sees Dr. Isabel Caprice, urology.   . Cervical spondylosis 2004   sp surgery by Dr. Danielle Dess  . Depression   . GERD (gastroesophageal reflux disease)   . Headache(784.0)    Chronic, on vicodin 5 TID for this.   . Hepatitis C    Has occasional visits with Dr. Randa Evens GI, failed RX in 2000.  Liver Biopsy 9/06: Minimally active hepatitis consistent with hepatitis C, minimal necroinflamtory activity grade 1, no incrased fibrosis stage 0.   . Herpes labialis   . History of kidney stones   . Hypertension   . Pneumonia   . Renal stone   . Spondylolisthesis of lumbar region   . Thalassemia    HGB 9/09 14.4 wtih MCV 72.6  . Wears glasses     Past Surgical History:  Procedure Laterality Date  . ANTERIOR CERVICAL DECOMP/DISCECTOMY FUSION N/A 11/21/2017   Procedure: ANTERIOR CERVICAL DECOMPRESSION FUSION, CERVICAL THREE-FOUR, CERVICAL FOUR-FIVE WITH INSTRUMENTATION AND ALLOGRAFT;  Surgeon: Estill Bamberg, MD;  Location: [facility] OR;  Service:  Orthopedics;  Laterality: N/A;  ANTERIOR CERVICAL DECOMPRESSION FUSION, CERVICAL THREE-FOUR, CERVICAL FOUR-FIVE WITH INSTRUMENTATION AND ALLOGRAFT  . BACK SURGERY  2017   L4-L5 PLATE/SCREWS  . CERVICAL DISCECTOMY  2004   Dr Danielle Dess  . COLONOSCOPY    . LITHOTRIPSY  2004   Dr Logan Bores  . NASAL SEPTUM SURGERY  2007  . TONSILLECTOMY      There were no vitals filed for this visit.  Subjective Assessment - 06/12/18 1422    Subjective  Continues  with hip pain.  Will see hip Orth MD for hip assessment  in 2 weeks. Other wise feeling good in back and neck                       [facility] Adult PT Treatment/Exercise - 06/12/18 0001      Knee/Hip Exercises: Aerobic   Nustep  L6  6 min      Knee/Hip Exercises: Machines for Strengthening   Cybex Knee Extension  x10 15 pounds , x20    25 pound    Cybex Knee Flexion  2x10 25 #    Cybex Leg Press  2-3-4 plates x 10 reps       Shoulder Exercises: Standing   Other Standing Exercises  Worked on row and chest press on  macu=ines with 20pound max 2x10 reps.  and pu discussed pull downs and to keep neck /neutal.        Shoulder Exercises: ROM/Strengthening   UBE (Upper Arm Bike)  L2 5 min             PT Education - 06/12/18 1503    Education Details  neck and back neutral with machines  for return to  gym. Start 20 pounds for arm and 30 pounds for legs max and progress each week as able    Person(s) Educated  Patient    Methods  Explanation    Comprehension  Verbalized understanding       PT Short Term Goals - 05/20/18 1341      PT SHORT TERM GOAL #1   Title  He will be independent with initial HEP    Status  Achieved      PT SHORT TERM GOAL #2   Title  He will report pain decreased 20-30% with normal activity in LT hip    Baseline  with medication    Status  Achieved        PT Long Term Goals - 06/10/18 1430      PT LONG TERM GOAL #1   Title  FOTO will decreased to < 40% limited    Status  On-going      PT LONG  TERM GOAL #2   Title  He will tolerated gym equipment exercise without incr back pain to tolerate joining gym    Baseline  need to do equipment in last 2-3 sessions      PT LONG TERM GOAL #3   Title  He will report pain decreased 50% or more  in LT hip with normal activity    Baseline  improved but pain variable and can be high    Status  On-going      PT LONG TERM GOAL #4   Title  He will be independent with all HEP issued    Status  On-going            Plan - 06/12/18 1420    Clinical Impression Statement  No changes. cont with hip pain. Ready to return to gym.  Finish HEP next week    PT Treatment/Interventions  Passive range of motion;Manual techniques;Patient/family education;Therapeutic exercise;Moist Heat;Iontophoresis 4mg /ml Dexamethasone;Ultrasound;Cryotherapy;Electrical Stimulation    PT Next Visit Plan  ,review added  HEP , manual for ROM,  modalities as needed, cont. gait training,                 FOTO      PT Home Exercise Plan  Lt hip stretch  ER/IR/ Flex/abduction / adduction, bridging, functional squats at counter, toe raises, heel raises, sit to stand .   side steps with band, side clam with band , single leg bridge    Consulted and Agree with Plan of Care  Patient       Patient will benefit from skilled therapeutic intervention in order to improve the following deficits and impairments:  Pain, Decreased strength, Decreased range of motion, Difficulty walking  Visit Diagnosis: Pain in left hip  Abnormal posture  Difficulty in walking, not elsewhere classified  Muscle weakness (generalized)     Problem List Patient Active Problem List   Diagnosis Date Noted  . Urinary retention 11/08/2017  . Liver fibrosis 04/30/2017  . NSAID long-term use 01/09/2016  . Chronic back pain secondary to DJD of cervical, thoracic and lumbar spine and disc herniation  01/09/2016  . Osteoarthritis of both knees 11/02/2011  . NECK AND BACK PAIN 11/03/2009  . FOOT PAIN,  BILATERAL 09/20/2009  . DISORDER, DEPRESSIVE NEC 09/25/2006  . HERPES LABIALIS 04/04/2006  . Chronic hepatitis C without hepatic coma (HCC) 02/22/2006  . Essential hypertension 02/22/2006    Caprice Red  PT 06/12/2018, 4:36 PM  [facility] [address] [place], [place], [address] Phone: [phone]   Fax:  [phone]  Name: MANKIRAT IGLESIAS MRN: [id number] Date of Birth: 09-Mar-1956

## 2018-06-16 ENCOUNTER — Other Ambulatory Visit: Payer: Self-pay | Admitting: Gastroenterology

## 2018-06-16 ENCOUNTER — Ambulatory Visit
Admission: RE | Admit: 2018-06-16 | Discharge: 2018-06-16 | Disposition: A | Discharge status: Home / Self Care | Payer: Federal, State, Local not specified - PPO | Source: Ambulatory Visit | Attending: Gastroenterology | Admitting: Gastroenterology

## 2018-06-16 DIAGNOSIS — R1031 Right lower quadrant pain: Secondary | ICD-10-CM | POA: Diagnosis not present

## 2018-06-16 DIAGNOSIS — R1010 Upper abdominal pain, unspecified: Secondary | ICD-10-CM

## 2018-06-16 DIAGNOSIS — K2210 Ulcer of esophagus without bleeding: Secondary | ICD-10-CM | POA: Diagnosis not present

## 2018-06-16 DIAGNOSIS — N200 Calculus of kidney: Secondary | ICD-10-CM | POA: Diagnosis not present

## 2018-06-16 DIAGNOSIS — B182 Chronic viral hepatitis C: Secondary | ICD-10-CM | POA: Diagnosis not present

## 2018-06-16 DIAGNOSIS — K5730 Diverticulosis of large intestine without perforation or abscess without bleeding: Secondary | ICD-10-CM | POA: Diagnosis not present

## 2018-06-16 DIAGNOSIS — R1011 Right upper quadrant pain: Secondary | ICD-10-CM | POA: Diagnosis not present

## 2018-06-16 MED ORDER — IOPAMIDOL (ISOVUE-300) INJECTION 61%
100.0000 mL | Freq: Once | INTRAVENOUS | Status: AC | PRN
  Administered 2018-06-16: 100 mL via INTRAVENOUS

## 2018-06-17 ENCOUNTER — Encounter: Payer: Self-pay | Admitting: Physical Therapy

## 2018-06-17 ENCOUNTER — Ambulatory Visit: Payer: Federal, State, Local not specified - PPO | Attending: Orthopedic Surgery | Admitting: Physical Therapy

## 2018-06-17 DIAGNOSIS — M6281 Muscle weakness (generalized): Secondary | ICD-10-CM | POA: Insufficient documentation

## 2018-06-17 DIAGNOSIS — G8929 Other chronic pain: Secondary | ICD-10-CM | POA: Diagnosis not present

## 2018-06-17 DIAGNOSIS — R262 Difficulty in walking, not elsewhere classified: Secondary | ICD-10-CM | POA: Diagnosis not present

## 2018-06-17 DIAGNOSIS — M545 Low back pain: Secondary | ICD-10-CM | POA: Diagnosis not present

## 2018-06-17 DIAGNOSIS — M25552 Pain in left hip: Secondary | ICD-10-CM | POA: Diagnosis not present

## 2018-06-17 DIAGNOSIS — M62830 Muscle spasm of back: Secondary | ICD-10-CM

## 2018-06-17 DIAGNOSIS — R293 Abnormal posture: Secondary | ICD-10-CM

## 2018-06-17 NOTE — Therapy (Signed)
[facility] Outpatient Rehabilitation [facility] [address] [place], [place], [address] Phone: [phone]   Fax:  [phone]  Physical Therapy Treatment  Patient Details  Name: Greg Flowers MRN: [id number] Date of Birth: 06/13/1955 Referring Provider (PT): Estill Bamberg, MD   Encounter Date: 06/17/2018  PT End of Session - 06/17/18 1337    Visit Number  11    Number of Visits  12    Date for PT Re-Evaluation  06/13/18    Authorization Type  [employer] Federal    Authorization Time Period  progress note visit 10 and KX visit 15    PT Start Time  0134    PT Stop Time  0215    PT Time Calculation (min)  41 min       Past Medical History:  Diagnosis Date  . Anxiety   . Calculus, kidney 2000   Sees Dr. Isabel Caprice, urology.   . Cervical spondylosis 2004   sp surgery by Dr. Danielle Dess  . Depression   . GERD (gastroesophageal reflux disease)   . Headache(784.0)    Chronic, on vicodin 5 TID for this.   . Hepatitis C    Has occasional visits with Dr. Randa Evens GI, failed RX in 2000.  Liver Biopsy 9/06: Minimally active hepatitis consistent with hepatitis C, minimal necroinflamtory activity grade 1, no incrased fibrosis stage 0.   . Herpes labialis   . History of kidney stones   . Hypertension   . Pneumonia   . Renal stone   . Spondylolisthesis of lumbar region   . Thalassemia    HGB 9/09 14.4 wtih MCV 72.6  . Wears glasses     Past Surgical History:  Procedure Laterality Date  . ANTERIOR CERVICAL DECOMP/DISCECTOMY FUSION N/A 11/21/2017   Procedure: ANTERIOR CERVICAL DECOMPRESSION FUSION, CERVICAL THREE-FOUR, CERVICAL FOUR-FIVE WITH INSTRUMENTATION AND ALLOGRAFT;  Surgeon: Estill Bamberg, MD;  Location: [facility] OR;  Service: Orthopedics;  Laterality: N/A;  ANTERIOR CERVICAL DECOMPRESSION FUSION, CERVICAL THREE-FOUR, CERVICAL FOUR-FIVE WITH INSTRUMENTATION AND ALLOGRAFT  . BACK SURGERY  2017   L4-L5 PLATE/SCREWS  . CERVICAL DISCECTOMY  2004   Dr Danielle Dess  . COLONOSCOPY    .  LITHOTRIPSY  2004   Dr Logan Bores  . NASAL SEPTUM SURGERY  2007  . TONSILLECTOMY      There were no vitals filed for this visit.                    [facility] Adult PT Treatment/Exercise - 06/17/18 0001      Knee/Hip Exercises: Stretches   Active Hamstring Stretch  3 reps;30 seconds    Other Knee/Hip Stretches  LTR x 5 each       Knee/Hip Exercises: Aerobic   Nustep  L6  6 min      Knee/Hip Exercises: Machines for Strengthening   Cybex Knee Extension  20# 20 reps, 10 reps     Cybex Knee Flexion  35# x 25    Cybex Leg Press  3 plates x 20 4 plates  x 20       Shoulder Exercises: Standing   Other Standing Exercises  chest press seated 20# 15 each handle, seatd row 25# , lat pull 20#       Shoulder Exercises: ROM/Strengthening   UBE (Upper Arm Bike)  L3 x 5 minutes                PT Short Term Goals - 05/20/18 1341      PT SHORT  TERM GOAL #1   Title  He will be independent with initial HEP    Status  Achieved      PT SHORT TERM GOAL #2   Title  He will report pain decreased 20-30% with normal activity in LT hip    Baseline  with medication    Status  Achieved        PT Long Term Goals - 06/10/18 1430      PT LONG TERM GOAL #1   Title  FOTO will decreased to < 40% limited    Status  On-going      PT LONG TERM GOAL #2   Title  He will tolerated gym equipment exercise without incr back pain to tolerate joining gym    Baseline  need to do equipment in last 2-3 sessions      PT LONG TERM GOAL #3   Title  He will report pain decreased 50% or more  in LT hip with normal activity    Baseline  improved but pain variable and can be high    Status  On-going      PT LONG TERM GOAL #4   Title  He will be independent with all HEP issued    Status  On-going            Plan - 06/17/18 1451    Clinical Impression Statement  Pt reports still some discomfort with stairs and when he turns sometimes in standing . He has not yet made MD appt regarding  continued pain. Continued with gym equipment followed by stretching for transition to discaharge to HEP. No increased pain.     PT Next Visit Plan  ,review added  HEP , manual for ROM,  modalities as needed, cont. gait training,                 FOTO  /goals, dc    PT Home Exercise Plan  Lt hip stretch  ER/IR/ Flex/abduction / adduction, bridging, functional squats at counter, toe raises, heel raises, sit to stand .   side steps with band, side clam with band , single leg bridge    Consulted and Agree with Plan of Care  Patient       Patient will benefit from skilled therapeutic intervention in order to improve the following deficits and impairments:  Pain, Decreased strength, Decreased range of motion, Difficulty walking  Visit Diagnosis: Pain in left hip  Abnormal posture  Difficulty in walking, not elsewhere classified  Muscle weakness (generalized)  Muscle spasm of back  Chronic bilateral low back pain without sciatica     Problem List Patient Active Problem List   Diagnosis Date Noted  . Urinary retention 11/08/2017  . Liver fibrosis 04/30/2017  . NSAID long-term use 01/09/2016  . Chronic back pain secondary to DJD of cervical, thoracic and lumbar spine and disc herniation 01/09/2016  . Osteoarthritis of both knees 11/02/2011  . NECK AND BACK PAIN 11/03/2009  . FOOT PAIN, BILATERAL 09/20/2009  . DISORDER, DEPRESSIVE NEC 09/25/2006  . HERPES LABIALIS 04/04/2006  . Chronic hepatitis C without hepatic coma (HCC) 02/22/2006  . Essential hypertension 02/22/2006    Sherrie Mustache, PTA 06/17/2018, 2:57 PM  [facility] [address] [place], [place], [address] Phone: [phone]   Fax:  [phone]  Name: Greg Flowers MRN: [id number] Date of Birth: 1956/04/07

## 2018-06-19 ENCOUNTER — Ambulatory Visit: Payer: Federal, State, Local not specified - PPO

## 2018-06-19 DIAGNOSIS — M6281 Muscle weakness (generalized): Secondary | ICD-10-CM | POA: Diagnosis not present

## 2018-06-19 DIAGNOSIS — G8929 Other chronic pain: Secondary | ICD-10-CM | POA: Diagnosis not present

## 2018-06-19 DIAGNOSIS — R293 Abnormal posture: Secondary | ICD-10-CM | POA: Diagnosis not present

## 2018-06-19 DIAGNOSIS — M25552 Pain in left hip: Secondary | ICD-10-CM

## 2018-06-19 DIAGNOSIS — M545 Low back pain: Secondary | ICD-10-CM | POA: Diagnosis not present

## 2018-06-19 DIAGNOSIS — R262 Difficulty in walking, not elsewhere classified: Secondary | ICD-10-CM | POA: Diagnosis not present

## 2018-06-19 DIAGNOSIS — M62830 Muscle spasm of back: Secondary | ICD-10-CM | POA: Diagnosis not present

## 2018-06-19 NOTE — Therapy (Signed)
[place], [place], [address] Phone: [phone]   Fax:  [phone]  Physical Therapy Treatment/Discharge  Patient Details  Name: Greg Flowers MRN: [id number] Date of Birth: 27-Dec-1955 Referring Provider (PT): Phylliss Bob, MD   Encounter Date: 06/19/2018  PT End of Session - 06/19/18 1439    Visit Number  12    Number of Visits  12    Date for PT Re-Evaluation  06/13/18    Authorization Type  [employer]    Authorization Time Period  progress note visit 10 and KX visit 15    PT Start Time  0130    PT Stop Time  0217    PT Time Calculation (min)  47 min    Activity Tolerance  Patient tolerated treatment well;No increased pain    Behavior During Therapy  WFL for tasks assessed/performed       Past Medical History:  Diagnosis Date  . Anxiety   . Calculus, kidney 2000   Sees Dr. Risa Grill, urology.   . Cervical spondylosis 2004   sp surgery by Dr. Ellene Route  . Depression   . GERD (gastroesophageal reflux disease)   . Headache(784.0)    Chronic, on vicodin 5 TID for this.   . Hepatitis C    Has occasional visits with Dr. Oletta Lamas GI, failed RX in 2000.  Liver Biopsy 9/06: Minimally active hepatitis consistent with hepatitis C, minimal necroinflamtory activity grade 1, no incrased fibrosis stage 0.   . Herpes labialis   . History of kidney stones   . Hypertension   . Pneumonia   . Renal stone   . Spondylolisthesis of lumbar region   . Thalassemia    HGB 9/09 14.4 wtih MCV 72.6  . Wears glasses     Past Surgical History:  Procedure Laterality Date  . ANTERIOR CERVICAL DECOMP/DISCECTOMY FUSION N/A 11/21/2017   Procedure: ANTERIOR CERVICAL DECOMPRESSION FUSION, CERVICAL THREE-FOUR, CERVICAL FOUR-FIVE WITH INSTRUMENTATION AND ALLOGRAFT;  Surgeon: Phylliss Bob, MD;  Location: [place];  Service: Orthopedics;  Laterality: N/A;  ANTERIOR CERVICAL DECOMPRESSION FUSION, CERVICAL THREE-FOUR, CERVICAL FOUR-FIVE  WITH INSTRUMENTATION AND ALLOGRAFT  . BACK SURGERY  2017   L4-L5 PLATE/SCREWS  . CERVICAL DISCECTOMY  2004   Dr Ellene Route  . COLONOSCOPY    . LITHOTRIPSY  2004   Dr Amalia Hailey  . NASAL SEPTUM SURGERY  2007  . TONSILLECTOMY      There were no vitals filed for this visit.  Subjective Assessment - 06/19/18 1343    Subjective  Hip sore  otherwise doing well. Wil see Ortho Sx next Monday.     Pain Score  3     Pain Location  Hip    Pain Orientation  Left;Anterior;Lateral    Pain Descriptors / Indicators  Aching;Throbbing    Pain Type  Chronic pain    Pain Onset  More than a month ago    Pain Frequency  Intermittent    Aggravating Factors   lye Lt side , being on feet    Pain Relieving Factors  heat , meds         [facility] PT Assessment - 06/19/18 0001      Observation/Other Assessments   Focus on Therapeutic Outcomes (FOTO)   55% limited      AROM   Left Hip Extension  10    Left Hip Flexion  95    Left Hip External Rotation   33    Left Hip Internal  Rotation   20    Left Hip ABduction  26    Left Hip ADduction  18      PROM   Left Hip Extension  10    Left Hip Flexion  98    Left Hip External Rotation   35    Left Hip Internal Rotation   20    Left Hip ABduction  28    Left Hip ADduction  17      Strength   Right Hip Flexion  4+/5    Right Hip External Rotation   4+/5    Right Hip Internal Rotation  4+/5    Right Hip ABduction  4+/5    Right Hip ADduction  4+/5    Left Hip Flexion  4+/5    Left Hip External Rotation  5/5    Left Hip Internal Rotation  5/5    Left Hip ABduction  4+/5    Left Hip ADduction  4+/5    Right Ankle Dorsiflexion  4+/5                   [facility] Adult PT Treatment/Exercise - 06/19/18 0001      Knee/Hip Exercises: Aerobic   Nustep  L6  6 min      Knee/Hip Exercises: Standing   Lateral Step Up  Left;20 reps;Hand Hold: 1;Step Height: 8"      Knee/Hip Exercises: Supine   Bridges  Both;15 reps    Bridges with [employer]   Strengthening;Both;15 reps    Single Leg Bridge  Right;Left;15 reps      Knee/Hip Exercises: Sidelying   Hip ABduction  Left;10 reps    Clams  RT 20 reps clam and reverse clam  green band       All HEP reviewed except machines      PT Education - 06/19/18 1437    Education Details  Reviewed  HEP .Cautioned to stop exercises if neck or back pain occurs and if hip pain but if not HEP will impact strength and if in need of THA he will do well post.     Person(s) Educated  Patient    Methods  Explanation    Comprehension  Verbalized understanding;Returned demonstration       PT Short Term Goals - 05/20/18 1341      PT SHORT TERM GOAL #1   Title  He will be independent with initial HEP    Status  Achieved      PT SHORT TERM GOAL #2   Title  He will report pain decreased 20-30% with normal activity in LT hip    Baseline  with medication    Status  Achieved        PT Long Term Goals - 06/19/18 1440      PT LONG TERM GOAL #1   Title  FOTO will decreased to < 40% limited    Baseline  he is functionally better with less pain    Status  Not Met      PT LONG TERM GOAL #2   Title  He will tolerated gym equipment exercise without incr back pain to tolerate joining gym    Status  Achieved      PT LONG TERM GOAL #3   Title  He will report pain decreased 50% or more  in LT hip with normal activity    Baseline  improved but pain variable and can be high    Status  Partially Met  PT LONG TERM GOAL #4   Title  He will be independent with all HEP issued    Status  Achieved            Plan - 06/19/18 1403    Clinical Impression Statement  All His HEP was reviewed and he performed machine exercises last session to return to weight lifting. He did this safely . He is able to do all exercises without neck and back pain but he was cautioned to stop at any sign of neck or back pain or any significa.  He does not really need to Mr Demello is ready for discharge with a good  HEP for core and hip strength.  He has be cautioned to stop any exercise that gives neck or back pain or significant LT hip pain. He does not need to return for hip strength as he is doing a good strength program and will start machines at gym starting a with very low weight and progressing as tolerated with no  pain    PT Treatment/Interventions  Passive range of motion;Manual techniques;Patient/family education;Therapeutic exercise;Moist Heat;Iontophoresis 21m/ml Dexamethasone;Ultrasound;Cryotherapy;Electrical Stimulation    PT Next Visit Plan  Discharge to MD with HEP     PT Home Exercise Plan  Lt hip stretch  ER/IR/ Flex/abduction / adduction, bridging, functional squats at counter, toe raises, heel raises, sit to stand .   side steps with band, side clam with band , single leg bridge    Consulted and Agree with Plan of Care  Patient       Patient will benefit from skilled therapeutic intervention in order to improve the following deficits and impairments:  Pain, Decreased strength, Decreased range of motion, Difficulty walking  Visit Diagnosis: Pain in left hip  Abnormal posture  Difficulty in walking, not elsewhere classified     Problem List Patient Active Problem List   Diagnosis Date Noted  . Urinary retention 11/08/2017  . Liver fibrosis 04/30/2017  . NSAID long-term use 01/09/2016  . Chronic back pain secondary to DJD of cervical, thoracic and lumbar spine and disc herniation 01/09/2016  . Osteoarthritis of both knees 11/02/2011  . NECK AND BACK PAIN 11/03/2009  . FOOT PAIN, BILATERAL 09/20/2009  . DISORDER, DEPRESSIVE NEC 09/25/2006  . HERPES LABIALIS 04/04/2006  . Chronic hepatitis C without hepatic coma [place] 02/22/2006  . Essential hypertension 02/22/2006    CDarrel Hoover PT 06/19/2018, 2:41 PM  [facility] [address]Phone: [phone]  Fax:  [phone] Name: Greg PETERMRN: [id number] of Birth: 9Apr 26, 1957

## 2018-06-21 ENCOUNTER — Encounter: Payer: Self-pay | Admitting: Internal Medicine

## 2018-06-23 ENCOUNTER — Encounter: Payer: Self-pay | Admitting: Internal Medicine

## 2018-06-23 DIAGNOSIS — M25521 Pain in right elbow: Secondary | ICD-10-CM | POA: Diagnosis not present

## 2018-06-23 DIAGNOSIS — M1612 Unilateral primary osteoarthritis, left hip: Secondary | ICD-10-CM | POA: Diagnosis not present

## 2018-07-01 ENCOUNTER — Encounter: Payer: Self-pay | Admitting: Internal Medicine

## 2018-07-07 DIAGNOSIS — M1612 Unilateral primary osteoarthritis, left hip: Secondary | ICD-10-CM | POA: Diagnosis not present

## 2018-08-12 ENCOUNTER — Other Ambulatory Visit: Payer: Self-pay

## 2018-08-12 ENCOUNTER — Ambulatory Visit (INDEPENDENT_AMBULATORY_CARE_PROVIDER_SITE_OTHER): Payer: Federal, State, Local not specified - PPO | Admitting: Internal Medicine

## 2018-08-12 DIAGNOSIS — D509 Iron deficiency anemia, unspecified: Secondary | ICD-10-CM

## 2018-08-12 DIAGNOSIS — M25552 Pain in left hip: Secondary | ICD-10-CM

## 2018-08-12 DIAGNOSIS — I10 Essential (primary) hypertension: Secondary | ICD-10-CM | POA: Diagnosis not present

## 2018-08-12 MED ORDER — ATENOLOL 50 MG PO TABS: ORAL_TABLET | ORAL | 2 refills | Status: DC

## 2018-08-12 MED ORDER — BENAZEPRIL HCL 40 MG PO TABS: 80.0000 mg | ORAL_TABLET | Freq: Every day | ORAL | 1 refills | Status: DC

## 2018-08-12 MED ORDER — HYDROCHLOROTHIAZIDE 25 MG PO TABS: ORAL_TABLET | ORAL | 2 refills | Status: DC

## 2018-08-12 MED ORDER — AMLODIPINE BESYLATE 10 MG PO TABS: 10.0000 mg | ORAL_TABLET | Freq: Every day | ORAL | 3 refills | Status: DC

## 2018-08-12 MED ORDER — GABAPENTIN 800 MG PO TABS: 800.0000 mg | ORAL_TABLET | Freq: Three times a day (TID) | ORAL | 1 refills | Status: DC

## 2018-08-13 ENCOUNTER — Encounter: Payer: Self-pay | Admitting: Internal Medicine

## 2018-08-13 DIAGNOSIS — M25552 Pain in left hip: Secondary | ICD-10-CM | POA: Insufficient documentation

## 2018-08-13 DIAGNOSIS — D509 Iron deficiency anemia, unspecified: Secondary | ICD-10-CM | POA: Insufficient documentation

## 2018-08-13 NOTE — Progress Notes (Signed)
  [facility] Residency Telephone Encounter Continuity Care Appointment  HPI:   This telephone encounter was created for [name] on 08/13/2018 for the following purpose/cc: Follow up for HTN and chronic back and hip pain .   Past Medical History:  Past Medical History:  Diagnosis Date  . Anxiety   . Calculus, kidney 2000   Sees Dr. Isabel Caprice, urology.   . Cervical spondylosis 2004   sp surgery by Dr. Danielle Dess  . Depression   . GERD (gastroesophageal reflux disease)   . Headache(784.0)    Chronic, on vicodin 5 TID for this.   . Hepatitis C    Has occasional visits with Dr. Randa Evens GI, failed RX in 2000.  Liver Biopsy 9/06: Minimally active hepatitis consistent with hepatitis C, minimal necroinflamtory activity grade 1, no incrased fibrosis stage 0.   . Herpes labialis   . History of kidney stones   . Hypertension   . Pneumonia   . Renal stone   . Spondylolisthesis of lumbar region   . Thalassemia    HGB 9/09 14.4 wtih MCV 72.6  . Wears glasses       ROS:   Patient endorses worsening L hip pain. He denies fever, chills, HA, changes in vision, dizziness, chest pain, SOB, abdominal pain, N/V, and changes in bowel movements.    Assessment / Plan / Recommendations:   Please see A&P under problem oriented charting for assessment of the patient's acute and chronic medical conditions.   As always, pt is advised that if symptoms worsen or new symptoms arise, they should go to an urgent care facility or to to ER for further evaluation.   Consent and Medical Decision Making:   Patient discussed with Dr. Heide Spark  This is a telephone encounter between Greg Flowers and [name] on 08/13/2018 for chronic medical conditions stated above. The visit was conducted with the patient located at home and [name] at [facility]. The patient's identity was confirmed using their DOB and current address. The patient has consented to being evaluated  through a telephone encounter and understands the associated risks (an examination cannot be done and the patient may need to come in for an appointment) / benefits (allows the patient to remain at home, decreasing exposure to coronavirus). I personally spent 6 minutes on medical discussion.

## 2018-08-13 NOTE — Assessment & Plan Note (Signed)
Greg Flowers has a history of chronic L hip pain secondary to OA that has failed medical therapy. He was scheduled for total L hip replacement last month but this was canceled due to COVID-19 pandemic. He states gabapentin helps. I will increase his dose 600 mg TID --> 800 mg TID while he waits for his surgery to be reschedules.  - Increased gabapentin to 800 mg TID  - Follow up in 2- 3 months

## 2018-08-13 NOTE — Assessment & Plan Note (Addendum)
Blood pressure remains at goal on current regimen.  Renal function is stable on most recent labs from 2 months ago. Will continue amlodipine, atenolol, benazepril, and HCTZ at current doses.  All medications were refilled today.

## 2018-08-13 NOTE — Assessment & Plan Note (Signed)
Reviewed most recent labs from GI office.  Hemoglobin of 12.8 noted with low MCV and increased RDW consistent with iron deficiency anemia.  Patient was evaluated by Dr. Randa Evens and thought to have possible gastritis.  His Prilosec was switched to Protonix.  He reports doing well since then with resolution of abdominal pain.  He denies hematemesis, hematuria, melena, and hematochezia.  Will repeat CBC during follow-up visit.  - Repeat CBC at follow up visit, if remains anemic will need further work-up to identify source

## 2018-08-14 NOTE — Progress Notes (Signed)
Internal Medicine Clinic Attending  Case discussed with Dr. Santos-Sanchez at the time of the visit.  We reviewed the resident's history and exam and pertinent patient test results.  I agree with the assessment, diagnosis, and plan of care documented in the resident's note.    

## 2018-08-24 ENCOUNTER — Other Ambulatory Visit: Payer: Self-pay | Admitting: Internal Medicine

## 2018-08-24 DIAGNOSIS — M5416 Radiculopathy, lumbar region: Secondary | ICD-10-CM

## 2018-08-25 NOTE — Telephone Encounter (Signed)
I recently increased his dose to 800 mg TID and refilled it. Should not need a refill at this time.

## 2018-09-05 ENCOUNTER — Encounter (HOSPITAL_COMMUNITY): Payer: Self-pay | Admitting: Emergency Medicine

## 2018-09-05 ENCOUNTER — Ambulatory Visit (HOSPITAL_COMMUNITY)
Admission: EM | Admit: 2018-09-05 | Discharge: 2018-09-05 | Disposition: A | Discharge status: Home / Self Care | Payer: Federal, State, Local not specified - PPO | Attending: Internal Medicine | Admitting: Internal Medicine

## 2018-09-05 ENCOUNTER — Ambulatory Visit (HOSPITAL_COMMUNITY): Payer: Federal, State, Local not specified - PPO

## 2018-09-05 ENCOUNTER — Ambulatory Visit (INDEPENDENT_AMBULATORY_CARE_PROVIDER_SITE_OTHER): Payer: Federal, State, Local not specified - PPO

## 2018-09-05 ENCOUNTER — Other Ambulatory Visit: Payer: Self-pay

## 2018-09-05 DIAGNOSIS — M10071 Idiopathic gout, right ankle and foot: Secondary | ICD-10-CM | POA: Diagnosis not present

## 2018-09-05 DIAGNOSIS — S9031XA Contusion of right foot, initial encounter: Secondary | ICD-10-CM

## 2018-09-05 DIAGNOSIS — M79671 Pain in right foot: Secondary | ICD-10-CM | POA: Diagnosis not present

## 2018-09-05 DIAGNOSIS — M7989 Other specified soft tissue disorders: Secondary | ICD-10-CM | POA: Diagnosis not present

## 2018-09-05 DIAGNOSIS — S99921A Unspecified injury of right foot, initial encounter: Secondary | ICD-10-CM | POA: Diagnosis not present

## 2018-09-05 LAB — URIC ACID: Uric Acid, Serum: 8.5 mg/dL (ref 3.7–8.6)

## 2018-09-05 MED ORDER — PREDNISONE 10 MG PO TABS: 20.0000 mg | ORAL_TABLET | Freq: Every day | ORAL | 0 refills | Status: AC

## 2018-09-05 MED ORDER — HYDROCODONE-ACETAMINOPHEN 5-325 MG PO TABS: 2.0000 | ORAL_TABLET | ORAL | 0 refills | Status: DC | PRN

## 2018-09-05 NOTE — ED Triage Notes (Signed)
Pt presents to [facility] for assessment of right foot pain after he stubbed it on his back porch table on Tuesday.  States pain, redness, and swelling has been worsening since.

## 2018-09-05 NOTE — ED Provider Notes (Addendum)
[facility]    CSN: [id number] Arrival date & time: 09/05/18  1457     History   Chief Complaint Chief Complaint  Patient presents with  . Foot Pain    HPI Greg Flowers is a 63 y.o. male comes to the urgent care today with complaints of right foot pain.  Pain started about 5 days ago after he hit his leg against the brass chair. Pain is constant, severe (10/10).  No relieving factors.  Aggravated by movement.  Patient cannot bear weight on that foot. It is associated with some erythema at the base of the right great toe.  No fever or chills  HPI  Past Medical History:  Diagnosis Date  . Anxiety   . Calculus, kidney 2000   Sees Dr. Isabel Caprice, urology.   . Cervical spondylosis 2004   sp surgery by Dr. Danielle Dess  . Depression   . GERD (gastroesophageal reflux disease)   . Headache(784.0)    Chronic, on vicodin 5 TID for this.   . Hepatitis C    Has occasional visits with Dr. Randa Evens GI, failed RX in 2000.  Liver Biopsy 9/06: Minimally active hepatitis consistent with hepatitis C, minimal necroinflamtory activity grade 1, no incrased fibrosis stage 0.   . Herpes labialis   . History of kidney stones   . Hypertension   . Pneumonia   . Renal stone   . Spondylolisthesis of lumbar region   . Thalassemia    HGB 9/09 14.4 wtih MCV 72.6  . Wears glasses     Patient Active Problem List   Diagnosis Date Noted  . Left hip pain secondary to OA  08/13/2018  . Microcytic anemia 08/13/2018  . Urinary retention 11/08/2017  . Liver fibrosis 04/30/2017  . NSAID long-term use 01/09/2016  . Chronic back pain secondary to DJD of cervical, thoracic and lumbar spine and disc herniation 01/09/2016  . Osteoarthritis of both knees 11/02/2011  . FOOT PAIN, BILATERAL 09/20/2009  . DISORDER, DEPRESSIVE NEC 09/25/2006  . HERPES LABIALIS 04/04/2006  . Chronic hepatitis C without hepatic coma ([facility]) 02/22/2006  . Essential hypertension 02/22/2006    Past Surgical History:  Procedure  Laterality Date  . ANTERIOR CERVICAL DECOMP/DISCECTOMY FUSION N/A 11/21/2017   Procedure: ANTERIOR CERVICAL DECOMPRESSION FUSION, CERVICAL THREE-FOUR, CERVICAL FOUR-FIVE WITH INSTRUMENTATION AND ALLOGRAFT;  Surgeon: Estill Bamberg, MD;  Location[facility];  Service: Orthopedics;  Laterality: N/A;  ANTERIOR CERVICAL DECOMPRESSION FUSION, CERVICAL THREE-FOUR, CERVICAL FOUR-FIVE WITH INSTRUMENTATION AND ALLOGRAFT  . BACK SURGERY  2017   L4-L5 PLATE/SCREWS  . CERVICAL DISCECTOMY  2004   Dr Danielle Dess  . COLONOSCOPY    . LITHOTRIPSY  2004   Dr Logan Bores  . NASAL SEPTUM SURGERY  2007  . TONSILLECTOMY         Home Medications    Prior to Admission medications   Medication Sig Start Date End Date Taking? Authorizing Provider  pantoprazole (PROTONIX) 20 MG tablet Take 20 mg by mouth daily.   Yes [provider]  acetaminophen-codeine (TYLENOL #3) 300-30 MG tablet Take by mouth every 4 (four) hours as needed for moderate pain.    [provider]  amLODipine (NORVASC) 10 MG tablet Take 1 tablet (10 mg total) by mouth daily. 08/12/18   Santos-Sanchez, Chelsea Primus, MD  Ascorbic Acid (VITAMIN C) 1000 MG tablet Take 2,000 mg by mouth daily.    [provider]  atenolol (TENORMIN) 50 MG tablet TAKE 1 TABLET BY MOUTH EVERY DAY 08/12/18   Santos-Sanchez,  Idalys, MD  b complex vitamins tablet Take 1 tablet by mouth daily.    [provider]  benazepril (LOTENSIN) 40 MG tablet Take 2 tablets (80 mg total) by mouth daily. 08/12/18   [name], MD  cyclobenzaprine (FLEXERIL) 10 MG tablet Take 10 mg by mouth 3 (three) times daily as needed for muscle spasms.    [provider]  DULoxetine (CYMBALTA) 60 MG capsule TAKE 2 CAPSULES BY MOUTH EVERY DAY 01/28/18   Cottle, Steva Ready., MD  gabapentin (NEURONTIN) 800 MG tablet Take 1 tablet (800 mg total) by mouth 3 (three) times daily. 08/12/18   [name], MD  hydrochlorothiazide (HYDRODIURIL) 25 MG tablet TAKE 1  TABLET BY MOUTH EVERY DAY 08/12/18   [name], MD  Multiple Vitamin (MULTIVITAMIN WITH MINERALS) TABS tablet Take 1 tablet by mouth daily.    [provider]  Oxymetazoline HCl (VICKS SINEX 12 HOUR NA) Place 1 spray into the nose 2 (two) times daily as needed (for congestion.).    [provider]  sodium chloride (OCEAN) 0.65 % SOLN nasal spray Place 2 sprays into both nostrils as needed for congestion.    [provider]  Tamsulosin HCl (FLOMAX) 0.4 MG CAPS Take 0.8 mg by mouth daily.     [provider]  Tetrahydrozoline HCl (VISINE OP) Place 2 drops into both eyes as needed (for dry eyes).    [provider]  zolpidem (AMBIEN) 10 MG tablet Take 10 mg by mouth at bedtime as needed for sleep.    [provider]    Family History Family History  Problem Relation Age of Onset  . Hypertension Mother   . Hypertension Father     Social History Social History   Tobacco Use  . Smoking status: Former Smoker    Last attempt to quit: 04/16/1984    Years since quitting: 34.4  . Smokeless tobacco: Never Used  . Tobacco comment: quit in the early 1980's  Substance Use Topics  . Alcohol use: Yes    Comment: Occasionally 2-3 glasses wine/week  . Drug use: No     Allergies   Patient has no known allergies.   Review of Systems Review of Systems  HENT: Negative.   Respiratory: Negative.   Gastrointestinal: Negative.   Genitourinary: Negative.  Negative for dysuria, frequency and urgency.  Musculoskeletal: Positive for arthralgias. Negative for back pain, myalgias, neck pain and neck stiffness.  Skin: Positive for color change. Negative for rash.  Neurological: Negative.  Negative for dizziness, syncope and numbness.  All other systems reviewed and are negative.    Physical Exam Triage Vital Signs ED Triage Vitals [09/05/18 1512]  Enc Vitals Group     BP 124/85     Pulse Rate 85     Resp 18     Temp 98.4 F (36.9  C)     Temp Source Oral     SpO2 95 %     Weight      Height      Head Circumference      Peak Flow      Pain Score 10     Pain Loc      Pain Edu?      Excl. in GC?    No data found.  Updated Vital Signs BP 124/85 (BP Location: Right Arm)   Pulse 85   Temp 98.4 F (36.9 C) (Oral)   Resp 18   SpO2 95%   Visual Acuity Right  Eye Distance:   Left Eye Distance:   Bilateral Distance:    Right Eye Near:   Left Eye Near:    Bilateral Near:     Physical Exam Constitutional:      General: He is in acute distress.  Neck:     Musculoskeletal: Normal range of motion and neck supple.  Cardiovascular:     Rate and Rhythm: Normal rate and regular rhythm.  Pulmonary:     Effort: Pulmonary effort is normal.     Breath sounds: Normal breath sounds.  Abdominal:     General: Bowel sounds are normal.     Palpations: Abdomen is soft.  Musculoskeletal: Normal range of motion.  Skin:    Capillary Refill: Capillary refill takes less than 2 seconds.     Findings: Erythema present.     Comments: Erythema of the base of the right great toe  Neurological:     Mental Status: He is alert.      UC Treatments / Results  Labs (all labs ordered are listed, but only abnormal results are displayed) Labs Reviewed - No data to display  EKG None  Radiology No results found.  Procedures Procedures (including critical care time)  Medications Ordered in UC Medications - No data to display  Initial Impression / Assessment and Plan / UC Course  I have reviewed the triage vital signs and the nursing notes.  Pertinent labs & imaging results that were available during my care of the patient were reviewed by me and considered in my medical decision making (see chart for details).     1. Right foot pain sec to trauma:  Xray of the right foot is negative for any acute fracture Postop boot The location of the erythema and extensive soft tissue involvement makes acute gout the  possibility.  Patient will be given a short course of steroids. Uric acid level drawn  2.  Acute gout flare: Management as above.  Final Clinical Impressions(s) / UC Diagnoses   Final diagnoses:  None   Discharge Instructions   None    ED Prescriptions    None     Controlled Substance Prescriptions [place] Controlled Substance Registry consulted? Yes, I have consulted the  Controlled Substances Registry for this patient, and feel the risk/benefit ratio today is favorable for proceeding with this prescription for a controlled substance.   Merrilee JanskyLamptey, Philip O, MD 09/05/18 1654    Merrilee JanskyLamptey, Philip O, MD 09/05/18 [phone]

## 2018-09-05 NOTE — ED Notes (Signed)
Patient able to ambulate independently  

## 2018-09-06 ENCOUNTER — Other Ambulatory Visit: Payer: Self-pay | Admitting: Internal Medicine

## 2018-09-06 ENCOUNTER — Encounter: Payer: Self-pay | Admitting: Internal Medicine

## 2018-09-06 DIAGNOSIS — M25552 Pain in left hip: Secondary | ICD-10-CM

## 2018-09-10 ENCOUNTER — Encounter (HOSPITAL_COMMUNITY): Payer: Self-pay

## 2018-09-10 ENCOUNTER — Other Ambulatory Visit: Payer: Self-pay

## 2018-09-10 ENCOUNTER — Ambulatory Visit (HOSPITAL_COMMUNITY)
Admission: EM | Admit: 2018-09-10 | Discharge: 2018-09-10 | Disposition: A | Discharge status: Home / Self Care | Payer: Federal, State, Local not specified - PPO | Attending: Family Medicine | Admitting: Family Medicine

## 2018-09-10 DIAGNOSIS — M109 Gout, unspecified: Secondary | ICD-10-CM | POA: Diagnosis not present

## 2018-09-10 MED ORDER — COLCHICINE 0.6 MG PO TABS: ORAL_TABLET | ORAL | 0 refills | Status: DC

## 2018-09-10 MED ORDER — INDOMETHACIN 50 MG PO CAPS: 50.0000 mg | ORAL_CAPSULE | Freq: Three times a day (TID) | ORAL | 0 refills | Status: DC

## 2018-09-10 MED ORDER — HYDROCODONE-ACETAMINOPHEN 5-325 MG PO TABS: 1.0000 | ORAL_TABLET | Freq: Four times a day (QID) | ORAL | 0 refills | Status: DC | PRN

## 2018-09-10 NOTE — ED Triage Notes (Signed)
Pt cc here for a follow up on his right foot and toe. Pt states he needs a refill on his meds . Pt also wants his lab results.

## 2018-09-16 DIAGNOSIS — R1033 Periumbilical pain: Secondary | ICD-10-CM | POA: Diagnosis not present

## 2018-09-16 DIAGNOSIS — K210 Gastro-esophageal reflux disease with esophagitis: Secondary | ICD-10-CM | POA: Diagnosis not present

## 2018-09-16 DIAGNOSIS — K2210 Ulcer of esophagus without bleeding: Secondary | ICD-10-CM | POA: Diagnosis not present

## 2018-09-16 NOTE — ED Provider Notes (Signed)
[facility]   [id number] 09/10/18 Arrival Time: 1741  ASSESSMENT & PLAN:  1. Podagra    No sign of cellulitis. Discussed.  Meds ordered this encounter  Medications  . colchicine 0.6 MG tablet    Sig: Take two tablets as one dose followed one hour later by one tablet. Take no more than three tablets within a 24 hour period.    Dispense:  6 tablet    Refill:  0  . indomethacin (INDOCIN) 50 MG capsule    Sig: Take 1 capsule (50 mg total) by mouth 3 (three) times daily with meals.    Dispense:  15 capsule    Refill:  0  . HYDROcodone-acetaminophen (NORCO/VICODIN) 5-325 MG tablet    Sig: Take 1 tablet by mouth every 6 (six) hours as needed for moderate pain or severe pain.    Dispense:  8 tablet    Refill:  0    Follow-up Information    Schedule an appointment as soon as possible for a visit  with [name], MD.   Specialty:  Internal Medicine Contact information: [address] [place] [address] [phone]          [place] Controlled Substances Registry consulted for this patient. I feel the risk/benefit ratio today is favorable for proceeding with this prescription for a controlled substance. Medication sedation precautions given.  Reviewed expectations re: course of current medical issues. Questions answered. Outlined signs and symptoms indicating need for more acute intervention. Patient verbalized understanding. After Visit Summary given.  SUBJECTIVE: History from: patient. Seen here 09/05/2018. Note reviewed. Continuing gout symptoms/pain. Greg Flowers is a 63 y.o. male who reports persistent marked pain of his right great toe; described as aching without radiation. Onset: gradual, 1-2 weeks ago. Symptoms have progressed to a point and plateaued since beginning. No worsening. Completed prednisone; some help. Aggravating factors: "almost everything". Alleviating factors: none identified. Associated symptoms: none reported. Extremity  sensation changes or weakness: none. Self treatment: has not tried OTCs for relief of pain.   Past Surgical History:  Procedure Laterality Date  . ANTERIOR CERVICAL DECOMP/DISCECTOMY FUSION N/A 11/21/2017   Procedure: ANTERIOR CERVICAL DECOMPRESSION FUSION, CERVICAL THREE-FOUR, CERVICAL FOUR-FIVE WITH INSTRUMENTATION AND ALLOGRAFT;  Surgeon: Estill Bamberg, MD;  Location: [facility] OR;  Service: Orthopedics;  Laterality: N/A;  ANTERIOR CERVICAL DECOMPRESSION FUSION, CERVICAL THREE-FOUR, CERVICAL FOUR-FIVE WITH INSTRUMENTATION AND ALLOGRAFT  . BACK SURGERY  2017   L4-L5 PLATE/SCREWS  . CERVICAL DISCECTOMY  2004   Dr Danielle Dess  . COLONOSCOPY    . LITHOTRIPSY  2004   Dr Logan Bores  . NASAL SEPTUM SURGERY  2007  . TONSILLECTOMY       ROS: As per HPI. All other systems negative.   OBJECTIVE:  Vitals:   09/10/18 1755 09/10/18 1759  BP:  (!) 144/83  Pulse:  71  Resp:  18  Temp:  98 F (36.7 C)  TempSrc:  Oral  SpO2:  99%  Weight: 93 kg     General appearance: alert; no distress HEENT: [place]; AT Neck: supple with FROM Resp: unlabored respirations Extremities: . RLE: warm and well perfused; fairly well localized moderate tenderness over right proximal great toe/MTP joint; without gross deformities; with mild swelling and erythema; with no bruising; ROM: normal with reported discomfort CV: brisk extremity capillary refill of RLE; 2+ DP and PT pulse of RLE. Skin: warm and dry; no visible rashes Neurologic: gait normal; normal reflexes of RLE and LLE; normal sensation of RLE and LLE; normal  strength of RLE and LLE Psychological: alert and cooperative; normal mood and affect  No Known Allergies  Past Medical History:  Diagnosis Date  . Anxiety   . Calculus, kidney 2000   Sees Dr. Isabel CapriceGrapey, urology.   . Cervical spondylosis 2004   sp surgery by Dr. Danielle DessElsner  . Depression   . GERD (gastroesophageal reflux disease)   . Headache(784.0)    Chronic, on vicodin 5 TID for this.   . Hepatitis C    Has  occasional visits with Dr. Randa EvensEdwards [facility], failed RX in 2000.  Liver Biopsy 9/06: Minimally active hepatitis consistent with hepatitis C, minimal necroinflamtory activity grade 1, no incrased fibrosis stage 0.   . Herpes labialis   . History of kidney stones   . Hypertension   . Pneumonia   . Renal stone   . Spondylolisthesis of lumbar region   . Thalassemia    HGB 9/09 14.4 wtih MCV 72.6  . Wears glasses    Social History   Socioeconomic History  . Marital status: Married    Spouse name: Not on file  . Number of children: Not on file  . Years of education: Not on file  . Highest education level: Not on file  Occupational History  . Not on file  Social Needs  . Financial resource strain: Not on file  . Food insecurity:    Worry: Not on file    Inability: Not on file  . Transportation needs:    Medical: Not on file    Non-medical: Not on file  Tobacco Use  . Smoking status: Former Smoker    Last attempt to quit: 04/16/1984    Years since quitting: 34.4  . Smokeless tobacco: Never Used  . Tobacco comment: quit in the early 1980's  Substance and Sexual Activity  . Alcohol use: Yes    Comment: Occasionally 2-3 glasses wine/week  . Drug use: No  . Sexual activity: Yes    Partners: Female  Lifestyle  . Physical activity:    Days per week: Not on file    Minutes per session: Not on file  . Stress: Not on file  Relationships  . Social connections:    Talks on phone: Not on file    Gets together: Not on file    Attends religious service: Not on file    Active member of club or organization: Not on file    Attends meetings of clubs or organizations: Not on file    Relationship status: Not on file  Other Topics Concern  . Not on file  Social History Narrative   Works at [employer], Married, Exercises.    Family History  Problem Relation Age of Onset  . Hypertension Mother   . Hypertension Father    Past Surgical History:  Procedure Laterality Date  . ANTERIOR  CERVICAL DECOMP/DISCECTOMY FUSION N/A 11/21/2017   Procedure: ANTERIOR CERVICAL DECOMPRESSION FUSION, CERVICAL THREE-FOUR, CERVICAL FOUR-FIVE WITH INSTRUMENTATION AND ALLOGRAFT;  Surgeon: Estill Bambergumonski, Mark, MD;  Location: [facility] OR;  Service: Orthopedics;  Laterality: N/A;  ANTERIOR CERVICAL DECOMPRESSION FUSION, CERVICAL THREE-FOUR, CERVICAL FOUR-FIVE WITH INSTRUMENTATION AND ALLOGRAFT  . BACK SURGERY  2017   L4-L5 PLATE/SCREWS  . CERVICAL DISCECTOMY  2004   Dr Danielle DessElsner  . COLONOSCOPY    . LITHOTRIPSY  2004   Dr Logan BoresEvans  . NASAL SEPTUM SURGERY  2007  . Greig RightNSILLECTOMY        Krisha Beegle, MD 09/16/18 1216

## 2018-09-18 ENCOUNTER — Encounter: Payer: Self-pay | Admitting: Internal Medicine

## 2018-09-23 ENCOUNTER — Encounter: Payer: Self-pay | Admitting: Internal Medicine

## 2018-09-24 ENCOUNTER — Encounter: Payer: Self-pay | Admitting: Internal Medicine

## 2018-09-24 DIAGNOSIS — M7062 Trochanteric bursitis, left hip: Secondary | ICD-10-CM | POA: Diagnosis not present

## 2018-09-29 ENCOUNTER — Other Ambulatory Visit: Payer: Self-pay

## 2018-09-29 ENCOUNTER — Other Ambulatory Visit (INDEPENDENT_AMBULATORY_CARE_PROVIDER_SITE_OTHER): Payer: Federal, State, Local not specified - PPO

## 2018-09-29 ENCOUNTER — Other Ambulatory Visit: Payer: Self-pay | Admitting: Internal Medicine

## 2018-09-29 DIAGNOSIS — Z791 Long term (current) use of non-steroidal anti-inflammatories (NSAID): Secondary | ICD-10-CM | POA: Diagnosis not present

## 2018-09-29 DIAGNOSIS — M109 Gout, unspecified: Secondary | ICD-10-CM | POA: Diagnosis not present

## 2018-09-30 ENCOUNTER — Other Ambulatory Visit: Payer: Self-pay | Admitting: Internal Medicine

## 2018-09-30 ENCOUNTER — Telehealth: Payer: Self-pay | Admitting: Internal Medicine

## 2018-09-30 DIAGNOSIS — Z791 Long term (current) use of non-steroidal anti-inflammatories (NSAID): Secondary | ICD-10-CM

## 2018-09-30 DIAGNOSIS — M10379 Gout due to renal impairment, unspecified ankle and foot: Secondary | ICD-10-CM

## 2018-09-30 DIAGNOSIS — I10 Essential (primary) hypertension: Secondary | ICD-10-CM

## 2018-09-30 LAB — BMP8+ANION GAP
Anion Gap: 17 mmol/L (ref 10.0–18.0)
BUN/Creatinine Ratio: 22 (ref 10–24)
BUN: 45 mg/dL — ABNORMAL HIGH (ref 8–27)
CO2: 23 mmol/L (ref 20–29)
Calcium: 9.2 mg/dL (ref 8.6–10.2)
Chloride: 98 mmol/L (ref 96–106)
Creatinine, Ser: 2.08 mg/dL — ABNORMAL HIGH (ref 0.76–1.27)
GFR calc Af Amer: 38 mL/min/{1.73_m2} — ABNORMAL LOW (ref >59)
GFR calc non Af Amer: 33 mL/min/{1.73_m2} — ABNORMAL LOW (ref >59)
Glucose: 93 mg/dL (ref 65–99)
Potassium: 4.4 mmol/L (ref 3.5–5.2)
Sodium: 138 mmol/L (ref 134–144)

## 2018-09-30 LAB — URIC ACID: Uric Acid: 10.5 mg/dL — ABNORMAL HIGH (ref 3.7–8.6)

## 2018-09-30 MED ORDER — LOSARTAN POTASSIUM 50 MG PO TABS: 50.0000 mg | ORAL_TABLET | Freq: Every day | ORAL | 3 refills | Status: DC

## 2018-09-30 MED ORDER — ALLOPURINOL 100 MG PO TABS: 50.0000 mg | ORAL_TABLET | Freq: Every day | ORAL | 2 refills | Status: DC

## 2018-09-30 NOTE — Telephone Encounter (Signed)
Patient was recently evaluated at an ER 3 weeks ago for toe pain and was diagnosed with a gout flare.  He was prescribed indomethacin and colchicine which help with the pain.  He sent me a message through my chart about 1 week ago requesting a refill for indomethacin as it also helped with his chronic hip and lower back pain.  I reviewed patient's chart and noticed a mildly decreased GFR on most recent labs from 06/2018.  I therefore asked him to come to clinic for a lab visit on 6/15 for a BMP and uric acid (3 weeks after gout flare).  Received results this morning.  BUN 45 Cr 2.08 with GFR 33 (1.2-1.3 at baseline)  K 4.4 CO2 23 Uric acid 10.5   Called patient to discuss results and evaluate for possible etiologies of his AKI.  He tells me he is being worked up for chronic nausea and vomiting by GI.  He had 7 episodes of nonbloody nonbilious emesis on Friday as well as throughout the weekend.  He denies diarrhea.  He also tells me that his orthopedist has been managing his chronic pain with different medications.  Most recently, he was prescribed ibuprofen 800 mg 3 times daily which she has been taking for the past 5 days.  He is also on Tylenol 3 and low-dose Norco.  Has not had any imaging with contrast in the past few weeks.  He does not have a history of heart or liver disease.  He denies problems of urination, hematuria, shortness of breath, chest pain, and lower extremity swelling.  His AKI could be multifactorial from dehydration from chronic emesis as well as NSAID use, but I believe is primarily driven by intake of high-dose NSAIDs recently. I do not believe he needs to be seen in clinic at this time and can continue to be managed as an outpatient  We will adjust his medicines as below.  - STOP all NSAIDs, discussed this includes ibuprofen, Advil, Aleve, Motrin, naproxen, indomethacin, Toradol, etc.  Recommended him send me a message through my chart if he is prescribed any medications this week  to ensure they are not nephrotoxic. - STOP HCTZ and lisinopril - Decrease gabapentin to 1 tablet (800 mg) daily.  Discussed this can be uptitrated if and when his kidneys recover - START losartan 50 mg QD  - START allopurinol 50 mg QD with plan to uptitrate as able  - Encourage him to stay well hydrated, especially given chronic nausea vomiting - Lab visit next Monday for BMP, will message front desk staff to schedule appointment  All medications changes were discussed with patient.  All questions were answered.  I expect his kidneys to recover with the above changes.  If no improvement in renal function will need full work-up for AKI.  Welford Roche, MD  Internal Medicine PGY-2  P [phone]

## 2018-10-02 ENCOUNTER — Other Ambulatory Visit: Payer: Self-pay | Admitting: Internal Medicine

## 2018-10-02 DIAGNOSIS — M25552 Pain in left hip: Secondary | ICD-10-CM

## 2018-10-02 NOTE — Telephone Encounter (Signed)
Patient scheduled for labs on Monday. Will wait until I get results to refill as his dose may change. He has enough pills as I reduced his dose from TID to QD this week.

## 2018-10-03 ENCOUNTER — Other Ambulatory Visit: Payer: Self-pay | Admitting: Internal Medicine

## 2018-10-03 DIAGNOSIS — N179 Acute kidney failure, unspecified: Secondary | ICD-10-CM

## 2018-10-06 ENCOUNTER — Other Ambulatory Visit: Payer: Self-pay

## 2018-10-06 ENCOUNTER — Other Ambulatory Visit (INDEPENDENT_AMBULATORY_CARE_PROVIDER_SITE_OTHER): Payer: Federal, State, Local not specified - PPO

## 2018-10-06 DIAGNOSIS — N179 Acute kidney failure, unspecified: Secondary | ICD-10-CM | POA: Diagnosis not present

## 2018-10-07 ENCOUNTER — Telehealth: Payer: Self-pay

## 2018-10-07 LAB — BMP8+ANION GAP
Anion Gap: 18 mmol/L (ref 10.0–18.0)
BUN/Creatinine Ratio: 12 (ref 10–24)
BUN: 13 mg/dL (ref 8–27)
CO2: 24 mmol/L (ref 20–29)
Calcium: 9.4 mg/dL (ref 8.6–10.2)
Chloride: 102 mmol/L (ref 96–106)
Creatinine, Ser: 1.12 mg/dL (ref 0.76–1.27)
GFR calc Af Amer: 80 mL/min/{1.73_m2} (ref >59)
GFR calc non Af Amer: 69 mL/min/{1.73_m2} (ref >59)
Glucose: 100 mg/dL — ABNORMAL HIGH (ref 65–99)
Potassium: 4 mmol/L (ref 3.5–5.2)
Sodium: 144 mmol/L (ref 134–144)

## 2018-10-07 NOTE — Telephone Encounter (Signed)
Requesting to speak with Dr. Isac Sarna.

## 2018-10-08 ENCOUNTER — Other Ambulatory Visit: Payer: Self-pay

## 2018-10-08 ENCOUNTER — Encounter: Payer: Self-pay | Admitting: Internal Medicine

## 2018-10-08 DIAGNOSIS — M25552 Pain in left hip: Secondary | ICD-10-CM

## 2018-10-08 MED ORDER — GABAPENTIN 800 MG PO TABS: 800.0000 mg | ORAL_TABLET | Freq: Three times a day (TID) | ORAL | 1 refills | Status: DC

## 2018-10-08 NOTE — Telephone Encounter (Signed)
Pt states gabapentin (NEURONTIN) 800 MG tablet, is not at the pharmacy. Pt is using  [id number] - [place], [place] - [place] [phone] (Phone) [phone] (Fax)   Please call pt back.

## 2018-10-08 NOTE — Telephone Encounter (Signed)
Done

## 2018-10-08 NOTE — Telephone Encounter (Signed)
Called patient back. Discussed blood work and further interventions as stated in my result note. Thanks!

## 2018-10-20 ENCOUNTER — Other Ambulatory Visit: Payer: Self-pay | Admitting: Orthopaedic Surgery

## 2018-11-12 NOTE — Patient Instructions (Addendum)
YOU HAVE HAD A COVID 19 TEST. PLEASE CONEINUE THE QUARANTINE INSTRUCTIONS AS OUTLINED IN YOUR HANDOUT.                Greg Flowers  11/12/2018   Your procedure is scheduled on: 11-18-18    Report to [facility] Main  Entrance    Report to Admitting at 7:35 AM   1 VISITOR IS ALLOWED TO WAIT IN WAITING ROOM  ONLY DAY OF YOUR SURGERY.    Call this number if you have problems the morning of surgery [phone]    Remember: AFTER MIDNIGHT THE NIGHT PRIOR TO SURGERY. NOTHING EXCEPT CLEAR LIQUIDS. PLEASE FINISH ENSURE DRINK PER SURGEON  AT 7:05 AM.   CLEAR LIQUID DIET   Foods Allowed                                                                     Foods Excluded  Coffee and tea, regular and decaf                             liquids that you cannot  Plain Jell-O any favor except red or purple                                           see through such as: Fruit ices (not with fruit pulp)                                     milk, soups, orange juice  Iced Popsicles                                    All solid food Carbonated beverages, regular and diet                                    Cranberry, grape and apple juices Sports drinks like Gatorade Lightly seasoned clear broth or consume(fat free) Sugar, honey syrup  Sample Menu Breakfast                                Lunch                                     Supper Cranberry juice                    Beef broth                            Chicken broth Jell-O  Grape juice                           Apple juice Coffee or tea                        Jell-O                                      Popsicle                                                Coffee or tea                        Coffee or tea  _____________________________________________________________________      Take these medicines the morning of surgery with A SIP OF WATER: Allopurinol (Zyloprim), Amlodipine (Norvasc),  Atenolol (Tenormin), Gabapentin (Neurontin), Pantoprazole (Protonix), and Tamsulosin (Flomax)  BRUSH YOUR TEETH MORNING OF SURGERY AND RINSE YOUR MOUTH OUT, NO CHEWING GUM CANDY OR MINTS.                                 You may not have any metal on your body including hair pins and              piercings     Do not wear jewelry, cologne, lotions, powders or deodorant              Men may shave face and neck.   Do not bring valuables to the hospital. [facility] IS NOT             RESPONSIBLE   FOR VALUABLES.  Contacts, dentures or bridgework may not be worn into surgery.    Special Instructions: N/A              Please read over the following fact sheets you were given: _____________________________________________________________________             [facility] Before surgery, you can play an important role.  Because skin is not sterile, your skin needs to be as free of germs as possible.  You can reduce the number of germs on your skin by washing with CHG (chlorahexidine gluconate) soap before surgery.  CHG is an antiseptic cleaner which kills germs and bonds with the skin to continue killing germs even after washing. Please DO NOT use if you have an allergy to CHG or antibacterial soaps.  If your skin becomes reddened/irritated stop using the CHG and inform your nurse when you arrive at Short Stay. Do not shave (including legs and underarms) for at least 48 hours prior to the first CHG shower.  You may shave your face/neck. Please follow these instructions carefully:  1.  Shower with CHG Soap the night before surgery and the  morning of Surgery.  2.  If you choose to wash your hair, wash your hair first as usual with your  normal  shampoo.  3.  After you shampoo, rinse your hair and body thoroughly to remove the  shampoo.  4.  Use CHG as you would any other liquid soap.  You can apply chg directly  to the skin and wash                        Gently with a scrungie or clean washcloth.  5.  Apply the CHG Soap to your body ONLY FROM THE NECK DOWN.   Do not use on face/ open                           Wound or open sores. Avoid contact with eyes, ears mouth and genitals (private parts).                       Wash face,  Genitals (private parts) with your normal soap.             6.  Wash thoroughly, paying special attention to the area where your surgery  will be performed.  7.  Thoroughly rinse your body with warm water from the neck down.  8.  DO NOT shower/wash with your normal soap after using and rinsing off  the CHG Soap.                9.  Pat yourself dry with a clean towel.            10.  Wear clean pajamas.            11.  Place clean sheets on your bed the night of your first shower and do not  sleep with pets. Day of Surgery : Do not apply any lotions/deodorants the morning of surgery.  Please wear clean clothes to the hospital/surgery center.  FAILURE TO FOLLOW THESE INSTRUCTIONS MAY RESULT IN THE CANCELLATION OF YOUR SURGERY PATIENT SIGNATURE_________________________________  NURSE SIGNATURE__________________________________  ________________________________________________________________________   Greg Flowers  An incentive spirometer is a tool that can help keep your lungs clear and active. This tool measures how well you are filling your lungs with each breath. Taking long deep breaths may help reverse or decrease the chance of developing breathing (pulmonary) problems (especially infection) following:  A long period of time when you are unable to move or be active. BEFORE THE PROCEDURE   If the spirometer includes an indicator to show your best effort, your nurse or respiratory therapist will set it to a desired goal.  If possible, sit up straight or lean slightly forward. Try not to slouch.  Hold the incentive spirometer in an upright position. INSTRUCTIONS FOR USE  1. Sit on the edge of  your bed if possible, or sit up as far as you can in bed or on a chair. 2. Hold the incentive spirometer in an upright position. 3. Breathe out normally. 4. Place the mouthpiece in your mouth and seal your lips tightly around it. 5. Breathe in slowly and as deeply as possible, raising the piston or the ball toward the top of the column. 6. Hold your breath for 3-5 seconds or for as long as possible. Allow the piston or ball to fall to the bottom of the column. 7. Remove the mouthpiece from your mouth and breathe out normally. 8. Rest for a few seconds and repeat Steps 1 through 7 at least 10 times every 1-2 hours when you are awake. Take your time and take a few normal breaths between deep breaths. 9. The spirometer may include an indicator to  show your best effort. Use the indicator as a goal to work toward during each repetition. 10. After each set of 10 deep breaths, practice coughing to be sure your lungs are clear. If you have an incision (the cut made at the time of surgery), support your incision when coughing by placing a pillow or rolled up towels firmly against it. Once you are able to get out of bed, walk around indoors and cough well. You may stop using the incentive spirometer when instructed by your caregiver.  RISKS AND COMPLICATIONS  Take your time so you do not get dizzy or light-headed.  If you are in pain, you may need to take or ask for pain medication before doing incentive spirometry. It is harder to take a deep breath if you are having pain. AFTER USE  Rest and breathe slowly and easily.  It can be helpful to keep track of a log of your progress. Your caregiver can provide you with a simple table to help with this. If you are using the spirometer at home, follow these instructions: [place] IF:   You are having difficultly using the spirometer.  You have trouble using the spirometer as often as instructed.  Your pain medication is not giving enough relief  while using the spirometer.  You develop fever of 100.5 F (38.1 C) or higher. SEEK IMMEDIATE MEDICAL CARE IF:   You cough up bloody sputum that had not been present before.  You develop fever of 102 F (38.9 C) or greater.  You develop worsening pain at or near the incision site. MAKE SURE YOU:   Understand these instructions.  Will watch your condition.  Will get help right away if you are not doing well or get worse. Document Released: 08/13/2006 Document Revised: 06/25/2011 Document Reviewed: 10/14/2006 ExitCare Patient Information 2014 ExitCare, [place].   ________________________________________________________________________  WHAT IS A BLOOD TRANSFUSION? Blood Transfusion Information  A transfusion is the replacement of blood or some of its parts. Blood is made up of multiple cells which provide different functions.  Red blood cells carry oxygen and are used for blood loss replacement.  White blood cells fight against infection.  Platelets control bleeding.  Plasma helps clot blood.  Other blood products are available for specialized needs, such as hemophilia or other clotting disorders. BEFORE THE TRANSFUSION  Who gives blood for transfusions?   Healthy volunteers who are fully evaluated to make sure their blood is safe. This is blood bank blood. Transfusion therapy is the safest it has ever been in the practice of medicine. Before blood is taken from a donor, a complete history is taken to make sure that person has no history of diseases nor engages in risky social behavior (examples are intravenous drug use or sexual activity with multiple partners). The donor's travel history is screened to minimize risk of transmitting infections, such as malaria. The donated blood is tested for signs of infectious diseases, such as HIV and hepatitis. The blood is then tested to be sure it is compatible with you in order to minimize the chance of a transfusion reaction. If you or  a relative donates blood, this is often done in anticipation of surgery and is not appropriate for emergency situations. It takes many days to process the donated blood. RISKS AND COMPLICATIONS Although transfusion therapy is very safe and saves many lives, the main dangers of transfusion include:   Getting an infectious disease.  Developing a transfusion reaction. This is an allergic reaction  to something in the blood you were given. Every precaution is taken to prevent this. The decision to have a blood transfusion has been considered carefully by your caregiver before blood is given. Blood is not given unless the benefits outweigh the risks. AFTER THE TRANSFUSION  Right after receiving a blood transfusion, you will usually feel much better and more energetic. This is especially true if your red blood cells have gotten low (anemic). The transfusion raises the level of the red blood cells which carry oxygen, and this usually causes an energy increase.  The nurse administering the transfusion will monitor you carefully for complications. HOME CARE INSTRUCTIONS  No special instructions are needed after a transfusion. You may find your energy is better. Speak with your caregiver about any limitations on activity for underlying diseases you may have. SEEK MEDICAL CARE IF:   Your condition is not improving after your transfusion.  You develop redness or irritation at the intravenous (IV) site. SEEK IMMEDIATE MEDICAL CARE IF:  Any of the following symptoms occur over the next 12 hours:  Shaking chills.  You have a temperature by mouth above 102 F (38.9 C), not controlled by medicine.  Chest, back, or muscle pain.  People around you feel you are not acting correctly or are confused.  Shortness of breath or difficulty breathing.  Dizziness and fainting.  You get a rash or develop hives.  You have a decrease in urine output.  Your urine turns a dark color or changes to pink, red, or  brown. Any of the following symptoms occur over the next 10 days:  You have a temperature by mouth above 102 F (38.9 C), not controlled by medicine.  Shortness of breath.  Weakness after normal activity.  The white part of the eye turns yellow (jaundice).  You have a decrease in the amount of urine or are urinating less often.  Your urine turns a dark color or changes to pink, red, or brown. Document Released: 03/30/2000 Document Revised: 06/25/2011 Document Reviewed: 11/17/2007 [facility] Patient Information 2014 [place], [place].  _______________________________________________________________________

## 2018-11-14 ENCOUNTER — Other Ambulatory Visit (HOSPITAL_COMMUNITY)
Admission: RE | Admit: 2018-11-14 | Discharge: 2018-11-14 | Disposition: A | Discharge status: Home / Self Care | Payer: Federal, State, Local not specified - PPO | Source: Ambulatory Visit | Attending: Orthopaedic Surgery | Admitting: Orthopaedic Surgery

## 2018-11-14 ENCOUNTER — Inpatient Hospital Stay (HOSPITAL_COMMUNITY): Admission: RE | Admit: 2018-11-14 | Payer: Federal, State, Local not specified - PPO | Source: Ambulatory Visit

## 2018-11-14 ENCOUNTER — Encounter (HOSPITAL_COMMUNITY): Payer: Self-pay

## 2018-11-14 ENCOUNTER — Ambulatory Visit (HOSPITAL_COMMUNITY)
Admission: RE | Admit: 2018-11-14 | Discharge: 2018-11-14 | Disposition: A | Discharge status: Home / Self Care | Payer: Federal, State, Local not specified - PPO | Source: Ambulatory Visit | Attending: Orthopaedic Surgery | Admitting: Orthopaedic Surgery

## 2018-11-14 ENCOUNTER — Other Ambulatory Visit: Payer: Self-pay

## 2018-11-14 ENCOUNTER — Encounter (HOSPITAL_COMMUNITY)
Admission: RE | Admit: 2018-11-14 | Discharge: 2018-11-14 | Disposition: A | Discharge status: Home / Self Care | Payer: Federal, State, Local not specified - PPO | Source: Ambulatory Visit | Attending: Orthopaedic Surgery | Admitting: Orthopaedic Surgery

## 2018-11-14 DIAGNOSIS — Z01818 Encounter for other preprocedural examination: Secondary | ICD-10-CM | POA: Insufficient documentation

## 2018-11-14 DIAGNOSIS — J984 Other disorders of lung: Secondary | ICD-10-CM | POA: Diagnosis not present

## 2018-11-14 LAB — CBC WITH DIFFERENTIAL/PLATELET
Abs Immature Granulocytes: 0.02 10*3/uL (ref 0.00–0.07)
Basophils Absolute: 0.1 10*3/uL (ref 0.0–0.1)
Basophils Relative: 1 %
Eosinophils Absolute: 0.3 10*3/uL (ref 0.0–0.5)
Eosinophils Relative: 4 %
HCT: 40.6 % (ref 39.0–52.0)
Hemoglobin: 12 g/dL — ABNORMAL LOW (ref 13.0–17.0)
Immature Granulocytes: 0 %
Lymphocytes Relative: 40 %
Lymphs Abs: 3.8 10*3/uL (ref 0.7–4.0)
MCH: 21.6 pg — ABNORMAL LOW (ref 26.0–34.0)
MCHC: 29.6 g/dL — ABNORMAL LOW (ref 30.0–36.0)
MCV: 73 fL — ABNORMAL LOW (ref 80.0–100.0)
Monocytes Absolute: 0.8 10*3/uL (ref 0.1–1.0)
Monocytes Relative: 8 %
Neutro Abs: 4.4 10*3/uL (ref 1.7–7.7)
Neutrophils Relative %: 47 %
Platelets: 247 10*3/uL (ref 150–400)
RBC: 5.56 MIL/uL (ref 4.22–5.81)
RDW: 15.9 % — ABNORMAL HIGH (ref 11.5–15.5)
WBC: 9.4 10*3/uL (ref 4.0–10.5)
nRBC: 0 % (ref 0.0–0.2)

## 2018-11-14 LAB — BASIC METABOLIC PANEL
Anion gap: 9 (ref 5–15)
BUN: 18 mg/dL (ref 8–23)
CO2: 31 mmol/L (ref 22–32)
Calcium: 9.8 mg/dL (ref 8.9–10.3)
Chloride: 100 mmol/L (ref 98–111)
Creatinine, Ser: 1.32 mg/dL — ABNORMAL HIGH (ref 0.61–1.24)
GFR calc Af Amer: >60 mL/min (ref >60)
GFR calc non Af Amer: 57 mL/min — ABNORMAL LOW (ref >60)
Glucose, Bld: 106 mg/dL — ABNORMAL HIGH (ref 70–99)
Potassium: 4.1 mmol/L (ref 3.5–5.1)
Sodium: 140 mmol/L (ref 135–145)

## 2018-11-14 LAB — SURGICAL PCR SCREEN
MRSA, PCR: NEGATIVE
Staphylococcus aureus: NEGATIVE

## 2018-11-14 LAB — URINALYSIS, ROUTINE W REFLEX MICROSCOPIC
Bilirubin Urine: NEGATIVE
Glucose, UA: NEGATIVE mg/dL
Hgb urine dipstick: NEGATIVE
Ketones, ur: NEGATIVE mg/dL
Leukocytes,Ua: NEGATIVE
Nitrite: NEGATIVE
Protein, ur: NEGATIVE mg/dL
Specific Gravity, Urine: 1.019 (ref 1.005–1.030)
pH: 7 (ref 5.0–8.0)

## 2018-11-14 LAB — PROTIME-INR
INR: 1 (ref 0.8–1.2)
Prothrombin Time: 12.7 seconds (ref 11.4–15.2)

## 2018-11-14 LAB — APTT: aPTT: 42 seconds — ABNORMAL HIGH (ref 24–36)

## 2018-11-15 LAB — SARS CORONAVIRUS 2 (TAT 6-24 HRS): SARS Coronavirus 2: NEGATIVE

## 2018-11-15 LAB — ABO/RH: ABO/RH(D): B POS

## 2018-11-17 ENCOUNTER — Encounter: Payer: Self-pay | Admitting: Internal Medicine

## 2018-11-17 MED ORDER — BUPIVACAINE LIPOSOME 1.3 % IJ SUSP
10.0000 mL | Freq: Once | INTRAMUSCULAR | Status: DC
  Filled 2018-11-17: qty 10

## 2018-11-17 MED ORDER — TRANEXAMIC ACID 1000 MG/10ML IV SOLN
2000.0000 mg | INTRAVENOUS | Status: DC
  Filled 2018-11-17: qty 20

## 2018-11-17 NOTE — H&P (Signed)
TOTAL HIP ADMISSION H&P  Patient is admitted for left total hip arthroplasty.  Subjective:  Chief Complaint: left hip pain  HPI: Greg Flowers, 63 y.o. male, has a history of pain and functional disability in the left hip(s) due to arthritis and patient has failed non-surgical conservative treatments for greater than 12 weeks to include NSAID's and/or analgesics, corticosteriod injections, flexibility and strengthening excercises, supervised PT with diminished ADL's post treatment, use of assistive devices, weight reduction as appropriate and activity modification.  Onset of symptoms was gradual starting 5 years ago with gradually worsening course since that time.The patient noted no past surgery on the left hip(s).  Patient currently rates pain in the left hip at 10 out of 10 with activity. Patient has night pain, worsening of pain with activity and weight bearing, trendelenberg gait, pain that interfers with activities of daily living and crepitus. Patient has evidence of subchondral cysts, subchondral sclerosis, periarticular osteophytes and joint space narrowing by imaging studies. This condition presents safety issues increasing the risk of falls. There is no current active infection.  Patient Active Problem List   Diagnosis Date Noted  . Left hip pain secondary to OA  08/13/2018  . Microcytic anemia 08/13/2018  . Urinary retention 11/08/2017  . Liver fibrosis 04/30/2017  . NSAID long-term use 01/09/2016  . Chronic back pain secondary to DJD of cervical, thoracic and lumbar spine and disc herniation 01/09/2016  . Osteoarthritis of both knees 11/02/2011  . FOOT PAIN, BILATERAL 09/20/2009  . DISORDER, DEPRESSIVE NEC 09/25/2006  . HERPES LABIALIS 04/04/2006  . Chronic hepatitis C without hepatic coma (HCC) 02/22/2006  . Essential hypertension 02/22/2006   Past Medical History:  Diagnosis Date  . Anxiety   . Calculus, kidney 2000   Sees Dr. Isabel CapriceGrapey, urology.   . Cervical spondylosis  2004   sp surgery by Dr. Danielle DessElsner  . Depression   . GERD (gastroesophageal reflux disease)   . Headache(784.0)    Chronic, on vicodin 5 TID for this.   . Hepatitis C    Has occasional visits with Dr. Randa EvensEdwards [facility], failed RX in 2000.  Liver Biopsy 9/06: Minimally active hepatitis consistent with hepatitis C, minimal necroinflamtory activity grade 1, no incrased fibrosis stage 0.   . Herpes labialis   . History of kidney stones   . Hypertension   . Pneumonia   . Renal stone   . Spondylolisthesis of lumbar region   . Thalassemia    HGB 9/09 14.4 wtih MCV 72.6  . Wears glasses     Past Surgical History:  Procedure Laterality Date  . ANTERIOR CERVICAL DECOMP/DISCECTOMY FUSION N/A 11/21/2017   Procedure: ANTERIOR CERVICAL DECOMPRESSION FUSION, CERVICAL THREE-FOUR, CERVICAL FOUR-FIVE WITH INSTRUMENTATION AND ALLOGRAFT;  Surgeon: Estill Bambergumonski, Mark, MD;  Location: [facility] OR;  Service: Orthopedics;  Laterality: N/A;  ANTERIOR CERVICAL DECOMPRESSION FUSION, CERVICAL THREE-FOUR, CERVICAL FOUR-FIVE WITH INSTRUMENTATION AND ALLOGRAFT  . BACK SURGERY  2017   L4-L5 PLATE/SCREWS  . CERVICAL DISCECTOMY  2004   Dr Danielle DessElsner  . COLONOSCOPY    . LITHOTRIPSY  2004   Dr Logan BoresEvans  . NASAL SEPTUM SURGERY  2007  . TONSILLECTOMY      Current Facility-Administered Medications  Medication Dose Route Frequency Provider Last Rate Last Dose  . [START ON 11/18/2018] bupivacaine liposome (EXPAREL) 1.3 % injection 133 mg  10 mL Other Once Marcene Corningalldorf, Peter, MD      . Melene Muller[START ON 11/18/2018] tranexamic acid (CYKLOKAPRON) 2,000 mg in sodium chloride 0.9 % 50 mL Topical Application  2,000 mg Topical To OR Marcene Corningalldorf, Peter, MD       Current Outpatient Medications  Medication Sig Dispense Refill Last Dose  . acetaminophen-codeine (TYLENOL #3) 300-30 MG tablet Take 4 tablets by mouth 2 (two) times a day.     . allopurinol (ZYLOPRIM) 100 MG tablet Take 0.5 tablets (50 mg total) by mouth daily. 30 tablet 2   . amLODipine (NORVASC) 10 MG tablet  Take 1 tablet (10 mg total) by mouth daily. 90 tablet 3   . APPLE CIDER VINEGAR PO Take 480 mg by mouth daily.     Marland Kitchen. atenolol (TENORMIN) 50 MG tablet TAKE 1 TABLET BY MOUTH EVERY DAY (Patient taking differently: Take 50 mg by mouth daily. ) 90 tablet 2   . b complex vitamins tablet Take 1 tablet by mouth daily.     . cyclobenzaprine (FLEXERIL) 10 MG tablet Take 10 mg by mouth 3 (three) times daily as needed for muscle spasms.     . ferrous sulfate 325 (65 FE) MG tablet Take 325 mg by mouth daily with breakfast.     . gabapentin (NEURONTIN) 800 MG tablet Take 1 tablet (800 mg total) by mouth 3 (three) times daily. 270 tablet 1   . Garlic 2000 MG TBEC Take 2,000 mg by mouth daily.     . hydrochlorothiazide (HYDRODIURIL) 25 MG tablet Take 25 mg by mouth daily.     Marland Kitchen. losartan (COZAAR) 50 MG tablet Take 1 tablet (50 mg total) by mouth daily. 30 tablet 3   . Multiple Vitamin (MULTIVITAMIN WITH MINERALS) TABS tablet Take 1 tablet by mouth daily.     . pantoprazole (PROTONIX) 40 MG tablet Take 40 mg by mouth 2 (two) times daily.      . Tamsulosin HCl (FLOMAX) 0.4 MG CAPS Take 0.8 mg by mouth daily.      Marland Kitchen. thiamine 100 MG tablet Take 100 mg by mouth daily.     . DULoxetine (CYMBALTA) 60 MG capsule TAKE 2 CAPSULES BY MOUTH EVERY DAY (Patient not taking: Reported on 11/11/2018) 60 capsule 0 Not Taking at Unknown time  . HYDROcodone-acetaminophen (NORCO/VICODIN) 5-325 MG tablet Take 1 tablet by mouth every 6 (six) hours as needed for moderate pain or severe pain. (Patient not taking: Reported on 11/11/2018) 8 tablet 0 Not Taking at Unknown time   No Known Allergies  Social History   Tobacco Use  . Smoking status: Former Smoker    Quit date: 04/16/1984    Years since quitting: 34.6  . Smokeless tobacco: Never Used  . Tobacco comment: quit in the early 1980's  Substance Use Topics  . Alcohol use: Yes    Comment: Occasionally 2-3 glasses wine/week    Family History  Problem Relation Age of Onset  .  Hypertension Mother   . Hypertension Father      Review of Systems  Musculoskeletal: Positive for joint pain.       Left hip  All other systems reviewed and are negative.   Objective:  Physical Exam  Constitutional: He is oriented to person, place, and time. He appears well-developed and well-nourished.  HENT:  Head: Normocephalic and atraumatic.  Eyes: Pupils are equal, round, and reactive to light.  Neck: Normal range of motion.  Cardiovascular: Normal rate and regular rhythm.  Respiratory: Effort normal.  GI: Soft.  Musculoskeletal:     Comments: Left hip is very tender to internal rotation.  His leg lengths are roughly equal.  He walks with a significantly altered  gait.  He has a scar on his low back which is well-healed and he has no pain to palpation there.  Sensation and motor function are intact in his feet with palpable pulses on both sides.    Neurological: He is alert and oriented to person, place, and time.  Skin: Skin is warm and dry.  Psychiatric: He has a normal mood and affect. His behavior is normal. Judgment and thought content normal.    Vital signs in last 24 hours:    Labs:   Estimated body mass index is 30.86 kg/m as calculated from the following:   Height as of 11/14/18: 5\' 9"  (1.753 m).   Weight as of 11/14/18: 94.8 kg.   Imaging Review Plain radiographs demonstrate severe degenerative joint disease of the left hip(s). The bone quality appears to be good for age and reported activity level.      Assessment/Plan:  End stage primary arthritis, left hip(s)  The patient history, physical examination, clinical judgement of the provider and imaging studies are consistent with end stage degenerative joint disease of the left hip(s) and total hip arthroplasty is deemed medically necessary. The treatment options including medical management, injection therapy, arthroscopy and arthroplasty were discussed at length. The risks and benefits of total hip  arthroplasty were presented and reviewed. The risks due to aseptic loosening, infection, stiffness, dislocation/subluxation,  thromboembolic complications and other imponderables were discussed.  The patient acknowledged the explanation, agreed to proceed with the plan and consent was signed. Patient is being admitted for inpatient treatment for surgery, pain control, PT, OT, prophylactic antibiotics, VTE prophylaxis, progressive ambulation and ADL's and discharge planning.The patient is planning to be discharged home with home health services

## 2018-11-17 NOTE — Progress Notes (Signed)
11-14-18 PTT results routed to Dr. Rhona Raider

## 2018-11-18 ENCOUNTER — Other Ambulatory Visit: Payer: Self-pay

## 2018-11-18 ENCOUNTER — Ambulatory Visit (HOSPITAL_COMMUNITY): Payer: Federal, State, Local not specified - PPO | Admitting: Physician Assistant

## 2018-11-18 ENCOUNTER — Ambulatory Visit (HOSPITAL_COMMUNITY): Payer: Federal, State, Local not specified - PPO

## 2018-11-18 ENCOUNTER — Encounter (HOSPITAL_COMMUNITY): Payer: Self-pay

## 2018-11-18 ENCOUNTER — Ambulatory Visit (HOSPITAL_COMMUNITY): Payer: Federal, State, Local not specified - PPO | Admitting: Registered Nurse

## 2018-11-18 ENCOUNTER — Observation Stay (HOSPITAL_COMMUNITY)
Admission: RE | Admit: 2018-11-18 | Discharge: 2018-11-19 | Disposition: A | Discharge status: Home / Self Care | Payer: Federal, State, Local not specified - PPO | Attending: Orthopaedic Surgery | Admitting: Orthopaedic Surgery

## 2018-11-18 ENCOUNTER — Encounter (HOSPITAL_COMMUNITY)
Admission: RE | Disposition: A | Discharge status: Home / Self Care | Payer: Self-pay | Source: Home / Self Care | Attending: Orthopaedic Surgery

## 2018-11-18 DIAGNOSIS — D509 Iron deficiency anemia, unspecified: Secondary | ICD-10-CM | POA: Diagnosis not present

## 2018-11-18 DIAGNOSIS — Z96642 Presence of left artificial hip joint: Secondary | ICD-10-CM | POA: Diagnosis not present

## 2018-11-18 DIAGNOSIS — B182 Chronic viral hepatitis C: Secondary | ICD-10-CM | POA: Insufficient documentation

## 2018-11-18 DIAGNOSIS — F329 Major depressive disorder, single episode, unspecified: Secondary | ICD-10-CM | POA: Insufficient documentation

## 2018-11-18 DIAGNOSIS — D569 Thalassemia, unspecified: Secondary | ICD-10-CM | POA: Diagnosis not present

## 2018-11-18 DIAGNOSIS — M1612 Unilateral primary osteoarthritis, left hip: Secondary | ICD-10-CM | POA: Diagnosis not present

## 2018-11-18 DIAGNOSIS — K219 Gastro-esophageal reflux disease without esophagitis: Secondary | ICD-10-CM | POA: Insufficient documentation

## 2018-11-18 DIAGNOSIS — F418 Other specified anxiety disorders: Secondary | ICD-10-CM | POA: Diagnosis not present

## 2018-11-18 DIAGNOSIS — Z79899 Other long term (current) drug therapy: Secondary | ICD-10-CM | POA: Insufficient documentation

## 2018-11-18 DIAGNOSIS — Z471 Aftercare following joint replacement surgery: Secondary | ICD-10-CM | POA: Diagnosis not present

## 2018-11-18 DIAGNOSIS — I10 Essential (primary) hypertension: Secondary | ICD-10-CM | POA: Insufficient documentation

## 2018-11-18 DIAGNOSIS — M170 Bilateral primary osteoarthritis of knee: Secondary | ICD-10-CM | POA: Diagnosis not present

## 2018-11-18 DIAGNOSIS — Z419 Encounter for procedure for purposes other than remedying health state, unspecified: Secondary | ICD-10-CM

## 2018-11-18 DIAGNOSIS — F419 Anxiety disorder, unspecified: Secondary | ICD-10-CM | POA: Diagnosis not present

## 2018-11-18 DIAGNOSIS — Z87891 Personal history of nicotine dependence: Secondary | ICD-10-CM | POA: Diagnosis not present

## 2018-11-18 HISTORY — PX: TOTAL HIP ARTHROPLASTY: SHX124

## 2018-11-18 LAB — TYPE AND SCREEN
ABO/RH(D): B POS
Antibody Screen: NEGATIVE

## 2018-11-18 SURGERY — ARTHROPLASTY, HIP, TOTAL, ANTERIOR APPROACH
Anesthesia: Spinal | Site: Hip | Laterality: Left

## 2018-11-18 MED ORDER — METHOCARBAMOL 500 MG IVPB - SIMPLE MED
INTRAVENOUS | Status: AC
  Filled 2018-11-18: qty 50

## 2018-11-18 MED ORDER — GABAPENTIN 400 MG PO CAPS
800.0000 mg | ORAL_CAPSULE | Freq: Three times a day (TID) | ORAL | Status: DC
  Administered 2018-11-18 – 2018-11-19 (×3): 800 mg via ORAL
  Filled 2018-11-18 (×3): qty 2

## 2018-11-18 MED ORDER — BUPIVACAINE IN DEXTROSE 0.75-8.25 % IT SOLN
INTRATHECAL | Status: DC | PRN
  Administered 2018-11-18: 1.6 mL via INTRATHECAL

## 2018-11-18 MED ORDER — ACETAMINOPHEN 500 MG PO TABS
500.0000 mg | ORAL_TABLET | Freq: Four times a day (QID) | ORAL | Status: AC
  Administered 2018-11-18 – 2018-11-19 (×4): 500 mg via ORAL
  Filled 2018-11-18 (×4): qty 1

## 2018-11-18 MED ORDER — PROPOFOL 10 MG/ML IV BOLUS
INTRAVENOUS | Status: DC | PRN
  Administered 2018-11-18 (×2): 20 mg via INTRAVENOUS

## 2018-11-18 MED ORDER — LIDOCAINE 2% (20 MG/ML) 5 ML SYRINGE
INTRAMUSCULAR | Status: DC | PRN
  Administered 2018-11-18: 40 mg via INTRAVENOUS

## 2018-11-18 MED ORDER — PROMETHAZINE HCL 25 MG/ML IJ SOLN: 6.2500 mg | INTRAMUSCULAR | Status: DC | PRN

## 2018-11-18 MED ORDER — PHENOL 1.4 % MT LIQD: 1.0000 | OROMUCOSAL | Status: DC | PRN

## 2018-11-18 MED ORDER — HYDROCODONE-ACETAMINOPHEN 7.5-325 MG PO TABS
1.0000 | ORAL_TABLET | ORAL | Status: DC | PRN
  Administered 2018-11-18 (×2): 2 via ORAL
  Administered 2018-11-19: 1 via ORAL
  Administered 2018-11-19: 2 via ORAL
  Administered 2018-11-19: 1 via ORAL
  Filled 2018-11-18: qty 2
  Filled 2018-11-18: qty 1
  Filled 2018-11-18: qty 2
  Filled 2018-11-18: qty 1
  Filled 2018-11-18: qty 2

## 2018-11-18 MED ORDER — ONDANSETRON HCL 4 MG/2ML IJ SOLN
INTRAMUSCULAR | Status: AC
  Filled 2018-11-18: qty 2

## 2018-11-18 MED ORDER — MIDAZOLAM HCL 5 MG/5ML IJ SOLN
INTRAMUSCULAR | Status: DC | PRN
  Administered 2018-11-18 (×2): 1 mg via INTRAVENOUS

## 2018-11-18 MED ORDER — 0.9 % SODIUM CHLORIDE (POUR BTL) OPTIME
TOPICAL | Status: DC | PRN
  Administered 2018-11-18: 1000 mL

## 2018-11-18 MED ORDER — CEFAZOLIN SODIUM-DEXTROSE 2-4 GM/100ML-% IV SOLN
2.0000 g | Freq: Four times a day (QID) | INTRAVENOUS | Status: AC
  Administered 2018-11-18 (×2): 2 g via INTRAVENOUS
  Filled 2018-11-18 (×2): qty 100

## 2018-11-18 MED ORDER — ACETAMINOPHEN 325 MG PO TABS: 325.0000 mg | ORAL_TABLET | Freq: Four times a day (QID) | ORAL | Status: DC | PRN

## 2018-11-18 MED ORDER — BISACODYL 5 MG PO TBEC: 5.0000 mg | DELAYED_RELEASE_TABLET | Freq: Every day | ORAL | Status: DC | PRN

## 2018-11-18 MED ORDER — MIDAZOLAM HCL 2 MG/2ML IJ SOLN
INTRAMUSCULAR | Status: AC
  Filled 2018-11-18: qty 2

## 2018-11-18 MED ORDER — ALLOPURINOL 100 MG PO TABS
50.0000 mg | ORAL_TABLET | Freq: Every day | ORAL | Status: DC
  Administered 2018-11-19: 50 mg via ORAL
  Filled 2018-11-18 (×2): qty 0.5

## 2018-11-18 MED ORDER — SODIUM CHLORIDE 0.9 % IV SOLN
INTRAVENOUS | Status: DC | PRN
  Administered 2018-11-18: 25 ug/min via INTRAVENOUS

## 2018-11-18 MED ORDER — CHLORHEXIDINE GLUCONATE 4 % EX LIQD: 60.0000 mL | Freq: Once | CUTANEOUS | Status: DC

## 2018-11-18 MED ORDER — LACTATED RINGERS IV SOLN
INTRAVENOUS | Status: DC
  Administered 2018-11-18 (×2): via INTRAVENOUS

## 2018-11-18 MED ORDER — LACTATED RINGERS IV SOLN
INTRAVENOUS | Status: DC
  Administered 2018-11-18: 19:00:00 via INTRAVENOUS

## 2018-11-18 MED ORDER — ALUM & MAG HYDROXIDE-SIMETH 200-200-20 MG/5ML PO SUSP: 30.0000 mL | ORAL | Status: DC | PRN

## 2018-11-18 MED ORDER — BUPIVACAINE-EPINEPHRINE (PF) 0.25% -1:200000 IJ SOLN
INTRAMUSCULAR | Status: DC | PRN
  Administered 2018-11-18: 30 mL

## 2018-11-18 MED ORDER — KETOROLAC TROMETHAMINE 15 MG/ML IJ SOLN
15.0000 mg | Freq: Four times a day (QID) | INTRAMUSCULAR | Status: DC
  Administered 2018-11-18 – 2018-11-19 (×3): 15 mg via INTRAVENOUS
  Filled 2018-11-18 (×3): qty 1

## 2018-11-18 MED ORDER — TRANEXAMIC ACID 1000 MG/10ML IV SOLN
INTRAVENOUS | Status: DC | PRN
  Administered 2018-11-18: 2000 mg via TOPICAL

## 2018-11-18 MED ORDER — ATENOLOL 50 MG PO TABS
50.0000 mg | ORAL_TABLET | Freq: Every day | ORAL | Status: DC
  Administered 2018-11-19: 50 mg via ORAL
  Filled 2018-11-18: qty 1

## 2018-11-18 MED ORDER — TRANEXAMIC ACID-NACL 1000-0.7 MG/100ML-% IV SOLN
1000.0000 mg | INTRAVENOUS | Status: AC
  Administered 2018-11-18: 1000 mg via INTRAVENOUS
  Filled 2018-11-18: qty 100

## 2018-11-18 MED ORDER — PANTOPRAZOLE SODIUM 40 MG PO TBEC
40.0000 mg | DELAYED_RELEASE_TABLET | Freq: Two times a day (BID) | ORAL | Status: DC
  Administered 2018-11-18 – 2018-11-19 (×2): 40 mg via ORAL
  Filled 2018-11-18 (×2): qty 1

## 2018-11-18 MED ORDER — MEPERIDINE HCL 50 MG/ML IJ SOLN: 6.2500 mg | INTRAMUSCULAR | Status: DC | PRN

## 2018-11-18 MED ORDER — DIPHENHYDRAMINE HCL 12.5 MG/5ML PO ELIX: 12.5000 mg | ORAL_SOLUTION | ORAL | Status: DC | PRN

## 2018-11-18 MED ORDER — TAMSULOSIN HCL 0.4 MG PO CAPS
0.8000 mg | ORAL_CAPSULE | Freq: Every day | ORAL | Status: DC
  Administered 2018-11-19: 0.8 mg via ORAL
  Filled 2018-11-18: qty 2

## 2018-11-18 MED ORDER — HYDROMORPHONE HCL 1 MG/ML IJ SOLN
0.2500 mg | INTRAMUSCULAR | Status: DC | PRN
  Administered 2018-11-18 (×2): 0.5 mg via INTRAVENOUS

## 2018-11-18 MED ORDER — FENTANYL CITRATE (PF) 100 MCG/2ML IJ SOLN
INTRAMUSCULAR | Status: AC
  Filled 2018-11-18: qty 2

## 2018-11-18 MED ORDER — POVIDONE-IODINE 10 % EX SWAB
2.0000 "application " | Freq: Once | CUTANEOUS | Status: AC
  Administered 2018-11-18: 2 via TOPICAL

## 2018-11-18 MED ORDER — METOCLOPRAMIDE HCL 5 MG/ML IJ SOLN: 5.0000 mg | Freq: Three times a day (TID) | INTRAMUSCULAR | Status: DC | PRN

## 2018-11-18 MED ORDER — PHENYLEPHRINE 40 MCG/ML (10ML) SYRINGE FOR IV PUSH (FOR BLOOD PRESSURE SUPPORT)
PREFILLED_SYRINGE | INTRAVENOUS | Status: AC
  Filled 2018-11-18: qty 20

## 2018-11-18 MED ORDER — CEFAZOLIN SODIUM-DEXTROSE 2-4 GM/100ML-% IV SOLN
2.0000 g | INTRAVENOUS | Status: AC
  Administered 2018-11-18: 2 g via INTRAVENOUS
  Filled 2018-11-18: qty 100

## 2018-11-18 MED ORDER — BUPIVACAINE-EPINEPHRINE (PF) 0.25% -1:200000 IJ SOLN
INTRAMUSCULAR | Status: AC
  Filled 2018-11-18: qty 30

## 2018-11-18 MED ORDER — EPHEDRINE SULFATE-NACL 50-0.9 MG/10ML-% IV SOSY
PREFILLED_SYRINGE | INTRAVENOUS | Status: DC | PRN
  Administered 2018-11-18 (×2): 10 mg via INTRAVENOUS

## 2018-11-18 MED ORDER — ONDANSETRON HCL 4 MG/2ML IJ SOLN
INTRAMUSCULAR | Status: DC | PRN
  Administered 2018-11-18: 4 mg via INTRAVENOUS

## 2018-11-18 MED ORDER — LIDOCAINE 2% (20 MG/ML) 5 ML SYRINGE
INTRAMUSCULAR | Status: AC
  Filled 2018-11-18: qty 5

## 2018-11-18 MED ORDER — KETOROLAC TROMETHAMINE 30 MG/ML IJ SOLN: 30.0000 mg | Freq: Once | INTRAMUSCULAR | Status: DC | PRN

## 2018-11-18 MED ORDER — LOSARTAN POTASSIUM 50 MG PO TABS
50.0000 mg | ORAL_TABLET | Freq: Every day | ORAL | Status: DC
  Filled 2018-11-18: qty 1

## 2018-11-18 MED ORDER — MENTHOL 3 MG MT LOZG: 1.0000 | LOZENGE | OROMUCOSAL | Status: DC | PRN

## 2018-11-18 MED ORDER — PROPOFOL 500 MG/50ML IV EMUL
INTRAVENOUS | Status: DC | PRN
  Administered 2018-11-18: 60 ug/kg/min via INTRAVENOUS

## 2018-11-18 MED ORDER — FENTANYL CITRATE (PF) 100 MCG/2ML IJ SOLN
INTRAMUSCULAR | Status: DC | PRN
  Administered 2018-11-18 (×2): 50 ug via INTRAVENOUS

## 2018-11-18 MED ORDER — HYDROCHLOROTHIAZIDE 25 MG PO TABS
25.0000 mg | ORAL_TABLET | Freq: Every day | ORAL | Status: DC
  Administered 2018-11-18: 25 mg via ORAL
  Filled 2018-11-18 (×2): qty 1

## 2018-11-18 MED ORDER — METHOCARBAMOL 500 MG PO TABS
500.0000 mg | ORAL_TABLET | Freq: Four times a day (QID) | ORAL | Status: DC | PRN
  Administered 2018-11-18 – 2018-11-19 (×2): 500 mg via ORAL
  Filled 2018-11-18 (×2): qty 1

## 2018-11-18 MED ORDER — ONDANSETRON HCL 4 MG PO TABS: 4.0000 mg | ORAL_TABLET | Freq: Four times a day (QID) | ORAL | Status: DC | PRN

## 2018-11-18 MED ORDER — DEXAMETHASONE SODIUM PHOSPHATE 10 MG/ML IJ SOLN
INTRAMUSCULAR | Status: AC
  Filled 2018-11-18: qty 1

## 2018-11-18 MED ORDER — EPHEDRINE 5 MG/ML INJ
INTRAVENOUS | Status: AC
  Filled 2018-11-18: qty 10

## 2018-11-18 MED ORDER — MORPHINE SULFATE (PF) 2 MG/ML IV SOLN: 0.5000 mg | INTRAVENOUS | Status: DC | PRN

## 2018-11-18 MED ORDER — METOCLOPRAMIDE HCL 5 MG PO TABS: 5.0000 mg | ORAL_TABLET | Freq: Three times a day (TID) | ORAL | Status: DC | PRN

## 2018-11-18 MED ORDER — DOCUSATE SODIUM 100 MG PO CAPS
100.0000 mg | ORAL_CAPSULE | Freq: Two times a day (BID) | ORAL | Status: DC
  Administered 2018-11-18 – 2018-11-19 (×2): 100 mg via ORAL
  Filled 2018-11-18 (×2): qty 1

## 2018-11-18 MED ORDER — METHOCARBAMOL 500 MG IVPB - SIMPLE MED
500.0000 mg | Freq: Four times a day (QID) | INTRAVENOUS | Status: DC | PRN
  Administered 2018-11-18: 500 mg via INTRAVENOUS
  Filled 2018-11-18: qty 50

## 2018-11-18 MED ORDER — TRANEXAMIC ACID-NACL 1000-0.7 MG/100ML-% IV SOLN
1000.0000 mg | Freq: Once | INTRAVENOUS | Status: AC
  Administered 2018-11-18: 1000 mg via INTRAVENOUS
  Filled 2018-11-18: qty 100

## 2018-11-18 MED ORDER — HYDROMORPHONE HCL 1 MG/ML IJ SOLN
INTRAMUSCULAR | Status: AC
  Filled 2018-11-18: qty 1

## 2018-11-18 MED ORDER — ONDANSETRON HCL 4 MG/2ML IJ SOLN: 4.0000 mg | Freq: Four times a day (QID) | INTRAMUSCULAR | Status: DC | PRN

## 2018-11-18 MED ORDER — DEXAMETHASONE SODIUM PHOSPHATE 10 MG/ML IJ SOLN
INTRAMUSCULAR | Status: DC | PRN
  Administered 2018-11-18: 10 mg via INTRAVENOUS

## 2018-11-18 MED ORDER — ASPIRIN 81 MG PO CHEW
81.0000 mg | CHEWABLE_TABLET | Freq: Two times a day (BID) | ORAL | Status: DC
  Administered 2018-11-18 – 2018-11-19 (×2): 81 mg via ORAL
  Filled 2018-11-18 (×2): qty 1

## 2018-11-18 MED ORDER — AMLODIPINE BESYLATE 10 MG PO TABS
10.0000 mg | ORAL_TABLET | Freq: Every day | ORAL | Status: DC
  Administered 2018-11-19: 10 mg via ORAL
  Filled 2018-11-18: qty 1

## 2018-11-18 MED ORDER — HYDROCODONE-ACETAMINOPHEN 5-325 MG PO TABS: 1.0000 | ORAL_TABLET | ORAL | Status: DC | PRN

## 2018-11-18 MED ORDER — PROPOFOL 10 MG/ML IV BOLUS
INTRAVENOUS | Status: AC
  Filled 2018-11-18: qty 60

## 2018-11-18 MED ORDER — PHENYLEPHRINE 40 MCG/ML (10ML) SYRINGE FOR IV PUSH (FOR BLOOD PRESSURE SUPPORT)
PREFILLED_SYRINGE | INTRAVENOUS | Status: DC | PRN
  Administered 2018-11-18 (×4): 80 ug via INTRAVENOUS

## 2018-11-18 MED ORDER — BUPIVACAINE LIPOSOME 1.3 % IJ SUSP
INTRAMUSCULAR | Status: DC | PRN
  Administered 2018-11-18: 10 mL

## 2018-11-18 SURGICAL SUPPLY — 42 items
BAG DECANTER FOR FLEXI CONT (MISCELLANEOUS) ×2 IMPLANT
BLADE SAW SGTL 18X1.27X75 (BLADE) ×2 IMPLANT
CELLS DAT CNTRL 66122 CELL SVR (MISCELLANEOUS) ×1 IMPLANT
COVER PERINEAL POST (MISCELLANEOUS) ×2 IMPLANT
COVER SURGICAL LIGHT HANDLE (MISCELLANEOUS) ×2 IMPLANT
COVER WAND RF STERILE (DRAPES) IMPLANT
CUP PINN GRIPTON 54 100 (Cup) ×1 IMPLANT
DECANTER SPIKE VIAL GLASS SM (MISCELLANEOUS) ×2 IMPLANT
DRAPE IMP U-DRAPE 54X76 (DRAPES) ×2 IMPLANT
DRAPE STERI IOBAN 125X83 (DRAPES) ×2 IMPLANT
DRAPE U-SHAPE 47X51 STRL (DRAPES) ×4 IMPLANT
DRSG AQUACEL AG ADV 3.5X10 (GAUZE/BANDAGES/DRESSINGS) ×2 IMPLANT
DURAPREP 26ML APPLICATOR (WOUND CARE) ×2 IMPLANT
ELECT BLADE TIP CTD 4 INCH (ELECTRODE) ×2 IMPLANT
ELECT REM PT RETURN 15FT ADLT (MISCELLANEOUS) ×2 IMPLANT
ELIMINATOR HOLE APEX DEPUY (Hips) ×1 IMPLANT
GLOVE BIO SURGEON STRL SZ8 (GLOVE) ×4 IMPLANT
GLOVE BIOGEL PI IND STRL 8 (GLOVE) ×2 IMPLANT
GLOVE BIOGEL PI INDICATOR 8 (GLOVE) ×2
GOWN STRL REUS W/TWL XL LVL3 (GOWN DISPOSABLE) ×4 IMPLANT
HEAD CERAMIC 36 PLUS5 (Hips) ×1 IMPLANT
HOLDER FOLEY CATH W/STRAP (MISCELLANEOUS) ×2 IMPLANT
KIT TURNOVER KIT A (KITS) ×1 IMPLANT
LINER NEUTRAL 54X36MM PLUS 4 (Hips) ×1 IMPLANT
MANIFOLD NEPTUNE II (INSTRUMENTS) ×2 IMPLANT
NEEDLE HYPO 22GX1.5 SAFETY (NEEDLE) ×2 IMPLANT
NS IRRIG 1000ML POUR BTL (IV SOLUTION) ×2 IMPLANT
PACK ANTERIOR HIP CUSTOM (KITS) ×2 IMPLANT
PROTECTOR NERVE ULNAR (MISCELLANEOUS) ×2 IMPLANT
RETRACTOR WND ALEXIS 18 MED (MISCELLANEOUS) ×1 IMPLANT
RTRCTR WOUND ALEXIS 18CM MED (MISCELLANEOUS) ×2
STEM FEMORAL SZ 5MM STD ACTIS (Stem) ×1 IMPLANT
SUT ETHIBOND NAB CT1 #1 30IN (SUTURE) ×4 IMPLANT
SUT VIC AB 1 CT1 36 (SUTURE) ×2 IMPLANT
SUT VIC AB 2-0 CT1 27 (SUTURE) ×2
SUT VIC AB 2-0 CT1 TAPERPNT 27 (SUTURE) ×1 IMPLANT
SUT VIC AB 3-0 PS2 18 (SUTURE) ×2
SUT VIC AB 3-0 PS2 18XBRD (SUTURE) ×1 IMPLANT
SUT VLOC 180 0 24IN GS25 (SUTURE) ×2 IMPLANT
SYR 50ML LL SCALE MARK (SYRINGE) ×2 IMPLANT
TRAY FOLEY MTR SLVR 16FR STAT (SET/KITS/TRAYS/PACK) ×2 IMPLANT
YANKAUER SUCT BULB TIP 10FT TU (MISCELLANEOUS) ×2 IMPLANT

## 2018-11-18 NOTE — Op Note (Signed)
PRE-OP DIAGNOSIS:  LEFT HIP DEGENERATIVE JOINT DISEASE POST-OP DIAGNOSIS: same PROCEDURE:  LEFT TOTAL HIP ARTHROPLASTY ANTERIOR APPROACH ANESTHESIA:  Spinal and MAC SURGEON:  Melrose Nakayama MD ASSISTANT:  Loni Dolly PA-C   INDICATIONS FOR PROCEDURE:  The patient is a 63 y.o. male with a long history of a painful hip.  This has persisted despite multiple conservative measures.  The patient has persisted with pain and dysfunction making rest and activity difficult.  A total hip replacement is offered as surgical treatment.  Informed operative consent was obtained after discussion of possible complications including reaction to anesthesia, infection, neurovascular injury, dislocation, DVT, PE, and death.  The importance of the postoperative rehab program to optimize result was stressed with the patient.  SUMMARY OF FINDINGS AND PROCEDURE:  Under the above anesthesia through a anterior approach an the Hana table a left THR was performed.  The patient had severe degenerative change and excellent bone quality.  We used DePuy components to replace the hip and these were size 5 standard Actis femur capped with a +5 77m ceramic hip ball.  On the acetabular side we used a size 54 Gription shell with a plus 4 neutral polyethylene liner.  We did use a hole eliminator.  ALoni DollyPA-C assisted throughout and was invaluable to the completion of the case in that he helped position and retract while I performed the procedure.  He also closed simultaneously to help minimize OR time.  I used fluoroscopy throughout the case to check position of components and leg lengths and read all these views myself.  DESCRIPTION OF PROCEDURE:  The patient was taken to the OR suite where the above anesthetic was applied.  The patient was then positioned on the Hana table supine.  All bony prominences were appropriately padded.  Prep and drape was then performed in normal sterile fashion.  The patient was given kefzol preoperative  antibiotic and an appropriate time out was performed.  We then took an anterior approach to the left hip.  Dissection was taken through adipose to the tensor fascia lata fascia.  This structure was incised longitudinally and we dissected in the intermuscular interval just medial to this muscle.  Cobra retractors were placed superior and inferior to the femoral neck superficial to the capsule.  A capsular incision was then made and the retractors were placed along the femoral neck.  Xray was brought in to get a good level for the femoral neck cut which was made with an oscillating saw and osteotome.  The femoral head was removed with a corkscrew.  The acetabulum was exposed and some labral tissues were excised. Reaming was taken to the inside wall of the pelvis and sequentially up to 1 mm smaller than the actual component.  A trial of components was done and then the aforementioned acetabular shell was placed in appropriate tilt and anteversion confirmed by fluoroscopy. The liner was placed along with the hole eliminator and attention was turned to the femur.  The leg was brought down and over into adduction and the elevator bar was used to raise the femur up gently in the wound.  The piriformis was released with care taken to preserve the obturator internus attachment and all of the posterior capsule. The femur was reamed and then broached to the appropriate size.  A trial reduction was done and the aforementioned head and neck assembly gave uKoreathe best stability in extension with external rotation.  Leg lengths were felt to be about equal by  fluoroscopic exam.  The trial components were removed and the wound irrigated.  We then placed the femoral component in appropriate anteversion.  The head was applied to a dry stem neck and the hip again reduced.  It was again stable in the aforementioned position.  The would was irrigated again followed by re-approximation of anterior capsule with ethibond suture. Tensor  fascia was repaired with V-loc suture  followed by deep closure with #O and #2 undyed vicryl.  Skin was closed with subQ stitch and steristrips followed by a sterile dressing.  EBL and IOF can be obtained from anesthesia records.  DISPOSITION:  The patient was extubated in the OR and taken to PACU in stable condition to be admitted to the Orthopedic Surgery for appropriate post-op care to include perioperative antibiotics and DVT prophylaxis.

## 2018-11-18 NOTE — Anesthesia Procedure Notes (Signed)
Spinal  Patient location during procedure: OR Start time: 11/18/2018 10:03 AM End time: 11/18/2018 10:08 AM Staffing Anesthesiologist: Lyn Hollingshead, MD Resident/CRNA: Talbot Grumbling, CRNA Performed: resident/CRNA  Preanesthetic Checklist Completed: patient identified, site marked, surgical consent, pre-op evaluation, timeout performed, IV checked, risks and benefits discussed and monitors and equipment checked Spinal Block Patient position: sitting Prep: DuraPrep Patient monitoring: heart rate, cardiac monitor, continuous pulse ox and blood pressure Approach: midline Location: L3-4 Injection technique: single-shot Needle Needle type: Pencan  Needle gauge: 24 G Needle length: 9 cm Assessment Sensory level: T4 Additional Notes Clear CSF, no paresthesia, patient tolerated well.

## 2018-11-18 NOTE — Transfer of Care (Signed)
Immediate Anesthesia Transfer of Care Note  Patient: Greg Flowers  Procedure(s) Performed: Left Anterior Hip Arthroplasty (Left Hip)  Patient Location: PACU  Anesthesia Type:Spinal  Level of Consciousness: sedated  Airway & Oxygen Therapy: Patient Spontanous Breathing and Patient connected to face mask oxygen  Post-op Assessment: Report given to RN and Post -op Vital signs reviewed and stable  Post vital signs: Reviewed and stable  Last Vitals:  Vitals Value Taken Time  BP    Temp    Pulse 72 11/18/18 1146  Resp 13 11/18/18 1146  SpO2 97 % 11/18/18 1146  Vitals shown include unvalidated device data.  Last Pain:  Vitals:   11/18/18 0829  TempSrc: Oral  PainSc:       Patients Stated Pain Goal: 5 (89/38/10 1751)  Complications: No apparent anesthesia complications

## 2018-11-18 NOTE — Interval H&P Note (Signed)
History and Physical Interval Note:  11/18/2018 9:11 AM  Greg Flowers  has presented today for surgery, with the diagnosis of Left Hip Degenerative Joint Diseas.  The various methods of treatment have been discussed with the patient and family. After consideration of risks, benefits and other options for treatment, the patient has consented to  Procedure(s): Left Anterior Hip Arthroplasty (Left) as a surgical intervention.  The patient's history has been reviewed, patient examined, no change in status, stable for surgery.  I have reviewed the patient's chart and labs.  Questions were answered to the patient's satisfaction.     Arlie Riker G Garlan Drewes   

## 2018-11-18 NOTE — Interval H&P Note (Signed)
History and Physical Interval Note:  11/18/2018 9:11 AM  Greg Flowers  has presented today for surgery, with the diagnosis of Left Hip Degenerative Joint Diseas.  The various methods of treatment have been discussed with the patient and family. After consideration of risks, benefits and other options for treatment, the patient has consented to  Procedure(s): Left Anterior Hip Arthroplasty (Left) as a surgical intervention.  The patient's history has been reviewed, patient examined, no change in status, stable for surgery.  I have reviewed the patient's chart and labs.  Questions were answered to the patient's satisfaction.     [name]

## 2018-11-18 NOTE — Anesthesia Preprocedure Evaluation (Signed)
Anesthesia Evaluation  Patient identified by MRN, date of birth, ID band Patient awake    Reviewed: Allergy & Precautions, NPO status , Patient's Chart, lab work & pertinent test results  Airway Mallampati: I       Dental no notable dental hx. (+) Teeth Intact   Pulmonary former smoker,    Pulmonary exam normal breath sounds clear to auscultation       Cardiovascular hypertension, Pt. on medications and Pt. on home beta blockers Normal cardiovascular exam Rhythm:Regular Rate:Normal     Neuro/Psych PSYCHIATRIC DISORDERS Anxiety Depression    GI/Hepatic GERD  Medicated and Controlled,  Endo/Other    Renal/GU      Musculoskeletal   Abdominal   Peds  Hematology   Anesthesia Other Findings   Reproductive/Obstetrics                             Anesthesia Physical Anesthesia Plan  ASA: II  Anesthesia Plan: Spinal   Post-op Pain Management:    Induction:   PONV Risk Score and Plan: 2 and Ondansetron, Dexamethasone and Midazolam  Airway Management Planned: Nasal Cannula, Natural Airway and Simple Face Mask  Additional Equipment: None  Intra-op Plan:   Post-operative Plan: Extubation in OR  Informed Consent: I have reviewed the patients History and Physical, chart, labs and discussed the procedure including the risks, benefits and alternatives for the proposed anesthesia with the patient or authorized representative who has indicated his/her understanding and acceptance.       Plan Discussed with: CRNA  Anesthesia Plan Comments:         Anesthesia Quick Evaluation

## 2018-11-18 NOTE — Evaluation (Signed)
Physical Therapy Evaluation Patient Details Name: Greg Flowers MRN: [id number] DOB: June 27, 1955 Today's Date: 11/18/2018   History of Present Illness  s/p L DA THA; PMH: lumbar laminectomy, cervical fusion, HTN, Hep C  Clinical Impression  Pt is s/p THA resulting in the deficits listed below (see PT Problem List).  Pt amb 50' with RW,  Min/guard,  Pt doing very well; should continue to progress well  Pt will benefit from skilled PT to increase their independence and safety with mobility to allow discharge to the venue listed below.      Follow Up Recommendations Follow surgeon's recommendation for DC plan and follow-up therapies    Equipment Recommendations    none   Recommendations for Other Services       Precautions / Restrictions Precautions Precautions: Fall Restrictions Weight Bearing Restrictions: No LLE Weight Bearing: Weight bearing as tolerated      Mobility  Bed Mobility Overal bed mobility: Needs Assistance Bed Mobility: Supine to Sit     Supine to sit: Min guard     General bed mobility comments: incr time, cues for technique  Transfers Overall transfer level: Needs assistance Equipment used: Rolling walker (2 wheeled) Transfers: Sit to/from Stand Sit to Stand: Min guard;Min assist         General transfer comment: cues for hand placement  Ambulation/Gait Ambulation/Gait assistance: Min guard Gait Distance (Feet): 50 Feet Assistive device: Rolling walker (2 wheeled) Gait Pattern/deviations: Step-to pattern;Decreased weight shift to left     General Gait Details: cues for sequence and RW position  Stairs            Wheelchair Mobility    Modified Rankin (Stroke Patients Only)       Balance                                             Pertinent Vitals/Pain Pain Assessment: 0-10 Pain Score: 4  Pain Location: left hip Pain Descriptors / Indicators: Sore;Aching;Discomfort Pain Intervention(s): Limited  activity within patient's tolerance;Monitored during session;Ice applied    Home Living Family/patient expects to be discharged to:: Private residence Living Arrangements: Spouse/significant other Available Help at Discharge: Family Type of Home: House Home Access: Stairs to enter   Secretary/administratorntrance Stairs-Number of Steps: 2 Home Layout: Two level Home Equipment: Environmental consultantWalker - 2 wheels;Adaptive equipment      Prior Function Level of Independence: Independent               Hand Dominance        Extremity/Trunk Assessment   Upper Extremity Assessment Upper Extremity Assessment: Overall WFL for tasks assessed    Lower Extremity Assessment Lower Extremity Assessment: LLE deficits/detail LLE Deficits / Details: ankle WFL; knee and hip grossly 3/5       Communication   Communication: No difficulties  Cognition Arousal/Alertness: Awake/alert Behavior During Therapy: WFL for tasks assessed/performed Overall Cognitive Status: Within Functional Limits for tasks assessed                                        General Comments      Exercises Total Joint Exercises Ankle Circles/Pumps: AROM;Both;10 reps Quad Sets: AROM;Both;10 reps   Assessment/Plan    PT Assessment Patient needs continued PT services  PT Problem List Decreased strength;Decreased range  of motion;Decreased activity tolerance;Pain;Decreased mobility       PT Treatment Interventions DME instruction;Gait training;Functional mobility training;Therapeutic exercise;Therapeutic activities;Patient/family education;Stair training    PT Goals (Current goals can be found in the Care Plan section)  Acute Rehab PT Goals PT Goal Formulation: With patient Time For Goal Achievement: 11/25/18 Potential to Achieve Goals: Good    Frequency 7X/week   Barriers to discharge        Co-evaluation               AM-PAC PT "6 Clicks" Mobility  Outcome Measure Help needed turning from your back to your  side while in a flat bed without using bedrails?: A Little Help needed moving from lying on your back to sitting on the side of a flat bed without using bedrails?: A Little Help needed moving to and from a bed to a chair (including a wheelchair)?: A Little Help needed standing up from a chair using your arms (e.g., wheelchair or bedside chair)?: A Little Help needed to walk in hospital room?: A Little Help needed climbing 3-5 steps with a railing? : A Little 6 Click Score: 18    End of Session Equipment Utilized During Treatment: Gait belt Activity Tolerance: Patient tolerated treatment well Patient left: in chair;with call bell/phone within reach;with chair alarm set;with family/visitor present   PT Visit Diagnosis: Difficulty in walking, not elsewhere classified (R26.2)    Time: 9147-8295 PT Time Calculation (min) (ACUTE ONLY): 30 min   Charges:   PT Evaluation $PT Eval Low Complexity: 1 Low PT Treatments $Gait Training: 8-22 mins        Kenyon Ana, PT  Pager: [phone] Acute Rehab Dept [facility]): [phone]   11/18/2018   [facility] An 11/18/2018, 5:12 PM

## 2018-11-18 NOTE — Anesthesia Postprocedure Evaluation (Signed)
Anesthesia Post Note  Patient: Greg Flowers  Procedure(s) Performed: Left Anterior Hip Arthroplasty (Left Hip)     Patient location during evaluation: PACU Anesthesia Type: Spinal Level of consciousness: awake Pain management: pain level controlled Vital Signs Assessment: post-procedure vital signs reviewed and stable Respiratory status: spontaneous breathing Cardiovascular status: blood pressure returned to baseline Postop Assessment: no apparent nausea or vomiting, no headache, no backache and spinal receding Anesthetic complications: no    Last Vitals:  Vitals:   11/18/18 1315 11/18/18 1330  BP: 127/78 127/77  Pulse: 66 63  Resp: 18 16  Temp: 36.5 C   SpO2: 98% 98%    Last Pain:  Vitals:   11/18/18 1330  TempSrc:   PainSc: 3    Pain Goal: Patients Stated Pain Goal: 5 (11/18/18 0825)  LLE Motor Response: Purposeful movement (11/18/18 1315) LLE Sensation: No numbness, No tingling (11/18/18 1315) RLE Motor Response: Purposeful movement (11/18/18 1315) RLE Sensation: No numbness, No tingling (11/18/18 1315) L Sensory Level: S1-Sole of foot, small toes (11/18/18 1315) R Sensory Level: S1-Sole of foot, small toes (11/18/18 1315) Epidural/Spinal Function Patient able to flex knees: Yes (11/18/18 1315)  Huston Foley

## 2018-11-18 NOTE — Anesthesia Procedure Notes (Signed)
Date/Time: 11/18/2018 10:12 AM Performed by: Talbot Grumbling, CRNA Oxygen Delivery Method: Simple face mask

## 2018-11-19 ENCOUNTER — Encounter: Payer: Self-pay | Admitting: Internal Medicine

## 2018-11-19 ENCOUNTER — Encounter (HOSPITAL_COMMUNITY): Payer: Self-pay | Admitting: Orthopaedic Surgery

## 2018-11-19 DIAGNOSIS — Z87891 Personal history of nicotine dependence: Secondary | ICD-10-CM | POA: Diagnosis not present

## 2018-11-19 DIAGNOSIS — F419 Anxiety disorder, unspecified: Secondary | ICD-10-CM | POA: Diagnosis not present

## 2018-11-19 DIAGNOSIS — D509 Iron deficiency anemia, unspecified: Secondary | ICD-10-CM | POA: Diagnosis not present

## 2018-11-19 DIAGNOSIS — K219 Gastro-esophageal reflux disease without esophagitis: Secondary | ICD-10-CM | POA: Diagnosis not present

## 2018-11-19 DIAGNOSIS — I10 Essential (primary) hypertension: Secondary | ICD-10-CM | POA: Diagnosis not present

## 2018-11-19 DIAGNOSIS — F329 Major depressive disorder, single episode, unspecified: Secondary | ICD-10-CM | POA: Diagnosis not present

## 2018-11-19 DIAGNOSIS — M1612 Unilateral primary osteoarthritis, left hip: Secondary | ICD-10-CM | POA: Diagnosis not present

## 2018-11-19 DIAGNOSIS — Z79899 Other long term (current) drug therapy: Secondary | ICD-10-CM | POA: Diagnosis not present

## 2018-11-19 DIAGNOSIS — D569 Thalassemia, unspecified: Secondary | ICD-10-CM | POA: Diagnosis not present

## 2018-11-19 DIAGNOSIS — B182 Chronic viral hepatitis C: Secondary | ICD-10-CM | POA: Diagnosis not present

## 2018-11-19 MED ORDER — TIZANIDINE HCL 4 MG PO TABS: 4.0000 mg | ORAL_TABLET | Freq: Four times a day (QID) | ORAL | 1 refills | Status: DC | PRN

## 2018-11-19 MED ORDER — ASPIRIN 81 MG PO CHEW: 81.0000 mg | CHEWABLE_TABLET | Freq: Two times a day (BID) | ORAL | 0 refills | Status: DC

## 2018-11-19 MED ORDER — HYDROCODONE-ACETAMINOPHEN 5-325 MG PO TABS: 1.0000 | ORAL_TABLET | Freq: Four times a day (QID) | ORAL | 0 refills | Status: DC | PRN

## 2018-11-19 NOTE — Discharge Summary (Signed)
Patient ID: Greg Flowers MRN: [id number] DOB/AGE: April 09, 1956 63 y.o.  Admit date: 11/18/2018 Discharge date: 11/19/2018  Admission Diagnoses:  Principal Problem:   Primary localized osteoarthritis of left hip Active Problems:   Primary osteoarthritis of left hip   Discharge Diagnoses:  Same  Past Medical History:  Diagnosis Date  . Anxiety   . Calculus, kidney 2000   Sees Dr. Risa Grill, urology.   . Cervical spondylosis 2004   sp surgery by Dr. Ellene Route  . Depression   . GERD (gastroesophageal reflux disease)   . Headache(784.0)    Chronic, on vicodin 5 TID for this.   . Hepatitis C    Has occasional visits with Dr. Oletta Lamas GI, failed RX in 2000.  Liver Biopsy 9/06: Minimally active hepatitis consistent with hepatitis C, minimal necroinflamtory activity grade 1, no incrased fibrosis stage 0.   . Herpes labialis   . History of kidney stones   . Hypertension   . Pneumonia   . Renal stone   . Spondylolisthesis of lumbar region   . Thalassemia    HGB 9/09 14.4 wtih MCV 72.6  . Wears glasses     Surgeries: Procedure(s): Left Anterior Hip Arthroplasty on 11/18/2018   Consultants:   Discharged Condition: Improved  Hospital Course: Greg Flowers is an 63 y.o. male who was admitted 11/18/2018 for operative treatment ofPrimary localized osteoarthritis of left hip. Patient has severe unremitting pain that affects sleep, daily activities, and work/hobbies. After pre-op clearance the patient was taken to the operating room on 11/18/2018 and underwent  Procedure(s): Left Anterior Hip Arthroplasty.    Patient was given perioperative antibiotics:  Anti-infectives (From admission, onward)   Start     Dose/Rate Route Frequency Ordered Stop   11/18/18 1600  ceFAZolin (ANCEF) IVPB 2g/100 mL premix     2 g 200 mL/hr over 30 Minutes Intravenous Every 6 hours 11/18/18 1459 11/18/18 2132   11/18/18 0800  ceFAZolin (ANCEF) IVPB 2g/100 mL premix     2 g 200 mL/hr over 30 Minutes Intravenous On  call to O.R. 11/18/18 0756 11/18/18 1010       Patient was given sequential compression devices, early ambulation, and chemoprophylaxis to prevent DVT.  Patient benefited maximally from hospital stay and there were no complications.    Recent vital signs:  Patient Vitals for the past 24 hrs:  BP Temp Temp src Pulse Resp SpO2 Height Weight  11/19/18 0407 139/84 98 F (36.7 C) Oral 79 17 98 % - -  11/19/18 0017 123/86 98 F (36.7 C) Oral 76 17 98 % - -  11/18/18 2000 130/88 98 F (36.7 C) Oral 85 17 100 % - -  11/18/18 1820 (!) 129/93 - - 89 - 100 % - -  11/18/18 1714 (!) 135/91 - - 80 15 100 % - -  11/18/18 1609 133/87 - - - - 99 % - -  11/18/18 1556 131/87 - - 67 16 100 % - -  11/18/18 1455 131/87 97.8 F (36.6 C) Oral 73 16 99 % - -  11/18/18 1400 (!) 139/92 - - 67 20 100 % - -  11/18/18 1330 127/77 - - 63 16 98 % - -  11/18/18 1315 127/78 97.7 F (36.5 C) - 66 18 98 % - -  11/18/18 1300 129/83 - - 64 18 98 % - -  11/18/18 1245 126/77 - - 70 10 100 % - -  11/18/18 1230 120/78 - - 63 14 98 % - -  11/18/18 1215 124/81 - - 66 10 99 % - -  11/18/18 1200 - - - 66 10 100 % - -  11/18/18 1145 104/73 (!) 97.5 F (36.4 C) - 72 13 97 % - -  11/18/18 0829 (!) 150/87 98.3 F (36.8 C) Oral 72 18 99 % - -  11/18/18 0758 - - - - - - 5\' 9"  (1.753 m) 94.8 kg     Recent laboratory studies: No results for input(s): WBC, HGB, HCT, PLT, NA, K, CL, CO2, BUN, CREATININE, GLUCOSE, INR, CALCIUM in the last 72 hours.  Invalid input(s): PT, 2   Discharge Medications:   Allergies as of 11/19/2018   No Known Allergies     Medication List    STOP taking these medications   acetaminophen-codeine 300-30 MG tablet Commonly known as: TYLENOL #3   cyclobenzaprine 10 MG tablet Commonly known as: FLEXERIL     TAKE these medications   allopurinol 100 MG tablet Commonly known as: ZYLOPRIM Take 0.5 tablets (50 mg total) by mouth daily.   amLODipine 10 MG tablet Commonly known as:  NORVASC Take 1 tablet (10 mg total) by mouth daily.   APPLE CIDER VINEGAR PO Take 480 mg by mouth daily.   aspirin 81 MG chewable tablet Chew 1 tablet (81 mg total) by mouth 2 (two) times daily.   atenolol 50 MG tablet Commonly known as: TENORMIN TAKE 1 TABLET BY MOUTH EVERY DAY What changed:   how much to take  how to take this  when to take this  additional instructions   b complex vitamins tablet Take 1 tablet by mouth daily.   DULoxetine 60 MG capsule Commonly known as: CYMBALTA TAKE 2 CAPSULES BY MOUTH EVERY DAY   ferrous sulfate 325 (65 FE) MG tablet Take 325 mg by mouth daily with breakfast.   gabapentin 800 MG tablet Commonly known as: NEURONTIN Take 1 tablet (800 mg total) by mouth 3 (three) times daily.   Garlic 2000 MG Tbec Take 2,000 mg by mouth daily.   Honey 5 GM/5ML Syrp Take by mouth.   hydrochlorothiazide 25 MG tablet Commonly known as: HYDRODIURIL Take 25 mg by mouth daily.   HYDROcodone-acetaminophen 5-325 MG tablet Commonly known as: NORCO/VICODIN Take 1-2 tablets by mouth every 6 (six) hours as needed for moderate pain or severe pain. What changed: how much to take   losartan 50 MG tablet Commonly known as: COZAAR Take 1 tablet (50 mg total) by mouth daily.   multivitamin with minerals Tabs tablet Take 1 tablet by mouth daily.   pantoprazole 40 MG tablet Commonly known as: PROTONIX Take 40 mg by mouth 2 (two) times daily.   tamsulosin 0.4 MG Caps capsule Commonly known as: FLOMAX Take 0.8 mg by mouth daily.   thiamine 100 MG tablet Take 100 mg by mouth daily.   tiZANidine 4 MG tablet Commonly known as: Zanaflex Take 1 tablet (4 mg total) by mouth every 6 (six) hours as needed.            Durable Medical Equipment  (From admission, onward)         Start     Ordered   11/18/18 1500  DME Walker rolling  Once    Question:  Patient needs a walker to treat with the following condition  Answer:  Primary osteoarthritis  of left hip   11/18/18 1459   11/18/18 1500  DME 3 n 1  Once     11/18/18 1459   11/18/18  1500  DME Bedside commode  Once    Question:  Patient needs a bedside commode to treat with the following condition  Answer:  Primary osteoarthritis of left hip   11/18/18 1459          Diagnostic Studies: Dg Chest 2 View  Result Date: 11/14/2018 CLINICAL DATA:  Preoperative hip arthroplasty.  Hypertension. EXAM: CHEST - 2 VIEW COMPARISON:  November 15, 2017 FINDINGS: There is slight scarring in the left base. The lungs elsewhere are clear. Heart size and pulmonary vascularity are normal. No adenopathy. There is degenerative change in the lower thoracic region. Postoperative change noted in the lower cervical region. IMPRESSION: No edema or consolidation.  Mild scarring left base. Electronically Signed   By: Bretta BangWilliam  Woodruff III M.D.   On: 11/14/2018 17:25   Dg C-arm 1-60 Min-no Report  Result Date: 11/18/2018 Fluoroscopy was utilized by the requesting physician.  No radiographic interpretation.   Dg Hip Operative Unilat W Or W/o Pelvis Left  Result Date: 11/18/2018 CLINICAL DATA:  63 year old male undergoing left hip arthroplasty. EXAM: OPERATIVE LEFT HIP (WITH PELVIS IF PERFORMED) 2 VIEWS TECHNIQUE: Fluoroscopic spot image(s) were submitted for interpretation post-operatively. COMPARISON:  CT Abdomen and Pelvis 06/16/2018 FLUOROSCOPY TIME:  0 MINUTES 6 SECONDS FINDINGS: Two intraoperative fluoroscopic spot views of the left hip and lower pelvis demonstrate bipolar hip arthroplasty hardware. Normal alignment in the AP plane. No unexpected osseous changes. Visible right hip appears stable. IMPRESSION: Unremarkable intraoperative images of left hip bipolar arthroplasty. Electronically Signed   By: Odessa FlemingH  Hall M.D.   On: 11/18/2018 11:34    Disposition: Discharge disposition: 01-Home or Self Care       Discharge Instructions    Call MD / Call 911   Complete by: As directed    If you experience chest  pain or shortness of breath, CALL 911 and be transported to the hospital emergency room.  If you develope a fever above 101 F, pus (white drainage) or increased drainage or redness at the wound, or calf pain, call your surgeon's office.   Constipation Prevention   Complete by: As directed    Drink plenty of fluids.  Prune juice may be helpful.  You may use a stool softener, such as Colace (over the counter) 100 mg twice a day.  Use MiraLax (over the counter) for constipation as needed.   Diet - low sodium heart healthy   Complete by: As directed    Discharge instructions   Complete by: As directed    INSTRUCTIONS AFTER JOINT REPLACEMENT   Remove items at home which could result in a fall. This includes throw rugs or furniture in walking pathways ICE to the affected joint every three hours while awake for 30 minutes at a time, for at least the first 3-5 days, and then as needed for pain and swelling.  Continue to use ice for pain and swelling. You may notice swelling that will progress down to the foot and ankle.  This is normal after surgery.  Elevate your leg when you are not up walking on it.   Continue to use the breathing machine you got in the hospital (incentive spirometer) which will help keep your temperature down.  It is common for your temperature to cycle up and down following surgery, especially at night when you are not up moving around and exerting yourself.  The breathing machine keeps your lungs expanded and your temperature down.   DIET:  As you were doing prior to  hospitalization, we recommend a well-balanced diet.  DRESSING / WOUND CARE / SHOWERING  You may shower 3 days after surgery, but keep the wounds dry during showering.  You may use an occlusive plastic wrap (Press'n Seal for example), NO SOAKING/SUBMERGING IN THE BATHTUB.  If the bandage gets wet, change with a clean dry gauze.  If the incision gets wet, pat the wound dry with a clean towel.  ACTIVITY  Increase  activity slowly as tolerated, but follow the weight bearing instructions below.   No driving for 6 weeks or until further direction given by your physician.  You cannot drive while taking narcotics.  No lifting or carrying greater than 10 lbs. until further directed by your surgeon. Avoid periods of inactivity such as sitting longer than an hour when not asleep. This helps prevent blood clots.  You may return to work once you are authorized by your doctor.     WEIGHT BEARING   Weight bearing as tolerated with assist device (walker, cane, etc) as directed, use it as long as suggested by your surgeon or therapist, typically at least 4-6 weeks.   EXERCISES  Results after joint replacement surgery are often greatly improved when you follow the exercise, range of motion and muscle strengthening exercises prescribed by your doctor. Safety measures are also important to protect the joint from further injury. Any time any of these exercises cause you to have increased pain or swelling, decrease what you are doing until you are comfortable again and then slowly increase them. If you have problems or questions, call your caregiver or physical therapist for advice.   Rehabilitation is important following a joint replacement. After just a few days of immobilization, the muscles of the leg can become weakened and shrink (atrophy).  These exercises are designed to build up the tone and strength of the thigh and leg muscles and to improve motion. Often times heat used for twenty to thirty minutes before working out will loosen up your tissues and help with improving the range of motion but do not use heat for the first two weeks following surgery (sometimes heat can increase post-operative swelling).   These exercises can be done on a training (exercise) mat, on the floor, on a table or on a bed. Use whatever works the best and is most comfortable for you.    Use music or television while you are exercising so  that the exercises are a pleasant break in your day. This will make your life better with the exercises acting as a break in your routine that you can look forward to.   Perform all exercises about fifteen times, three times per day or as directed.  You should exercise both the operative leg and the other leg as well.   Exercises include:   Quad Sets - Tighten up the muscle on the front of the thigh (Quad) and hold for 5-10 seconds.   Straight Leg Raises - With your knee straight (if you were given a brace, keep it on), lift the leg to 60 degrees, hold for 3 seconds, and slowly lower the leg.  Perform this exercise against resistance later as your leg gets stronger.  Leg Slides: Lying on your back, slowly slide your foot toward your buttocks, bending your knee up off the floor (only go as far as is comfortable). Then slowly slide your foot back down until your leg is flat on the floor again.  Angel Wings: Lying on your back spread your legs  to the side as far apart as you can without causing discomfort.  Hamstring Strength:  Lying on your back, push your heel against the floor with your leg straight by tightening up the muscles of your buttocks.  Repeat, but this time bend your knee to a comfortable angle, and push your heel against the floor.  You may put a pillow under the heel to make it more comfortable if necessary.   A rehabilitation program following joint replacement surgery can speed recovery and prevent re-injury in the future due to weakened muscles. Contact your doctor or a physical therapist for more information on knee rehabilitation.    CONSTIPATION  Constipation is defined medically as fewer than three stools per week and severe constipation as less than one stool per week.  Even if you have a regular bowel pattern at home, your normal regimen is likely to be disrupted due to multiple reasons following surgery.  Combination of anesthesia, postoperative narcotics, change in appetite and  fluid intake all can affect your bowels.   YOU MUST use at least one of the following options; they are listed in order of increasing strength to get the job done.  They are all available over the counter, and you may need to use some, POSSIBLY even all of these options:    Drink plenty of fluids (prune juice may be helpful) and high fiber foods Colace 100 mg by mouth twice a day  Senokot for constipation as directed and as needed Dulcolax (bisacodyl), take with full glass of water  Miralax (polyethylene glycol) once or twice a day as needed.  If you have tried all these things and are unable to have a bowel movement in the first 3-4 days after surgery call either your surgeon or your primary doctor.    If you experience loose stools or diarrhea, hold the medications until you stool forms back up.  If your symptoms do not get better within 1 week or if they get worse, check with your doctor.  If you experience "the worst abdominal pain ever" or develop nausea or vomiting, please contact the office immediately for further recommendations for treatment.   ITCHING:  If you experience itching with your medications, try taking only a single pain pill, or even half a pain pill at a time.  You can also use Benadryl over the counter for itching or also to help with sleep.   TED HOSE STOCKINGS:  Use stockings on both legs until for at least 2 weeks or as directed by physician office. They may be removed at night for sleeping.  MEDICATIONS:  See your medication summary on the "After Visit Summary" that nursing will review with you.  You may have some home medications which will be placed on hold until you complete the course of blood thinner medication.  It is important for you to complete the blood thinner medication as prescribed.  PRECAUTIONS:  If you experience chest pain or shortness of breath - call 911 immediately for transfer to the hospital emergency department.   If you develop a fever greater  that 101 F, purulent drainage from wound, increased redness or drainage from wound, foul odor from the wound/dressing, or calf pain - CONTACT YOUR SURGEON.  FOLLOW-UP APPOINTMENTS:  If you do not already have a post-op appointment, please call the office for an appointment to be seen by your surgeon.  Guidelines for how soon to be seen are listed in your "After Visit Summary", but are typically between 1-4 weeks after surgery.  OTHER INSTRUCTIONS:   Knee Replacement:  Do not place pillow under knee, focus on keeping the knee straight while resting. CPM instructions: 0-90 degrees, 2 hours in the morning, 2 hours in the afternoon, and 2 hours in the evening. Place foam block, curve side up under heel at all times except when in CPM or when walking.  DO NOT modify, tear, cut, or change the foam block in any way.  MAKE SURE YOU:  Understand these instructions.  Get help right away if you are not doing well or get worse.    Thank you for letting us be a part of your medical care team.  It is a privilege we respect greatly.  We hope these instructions will help you stay on track for a fast and full recovery!   Increase activity slowly as tolerated   Complete by: As directed       Follow-up Information    Marcene Corning, MD. Schedule an appointment as soon as possible for a visit in 2 weeks.   Specialty: Orthopedic Surgery Contact information: [address] [place] [address] [phone]            Signed: Ginger Organ Annlee Glandon 11/19/2018, 7:22 AM

## 2018-11-19 NOTE — Progress Notes (Signed)
   11/19/18 1300  PT Visit Information  Last PT Received On 11/19/18  Ready for d/c from PT standpoint. Excellent progress  Assistance Needed +1  History of Present Illness s/p L DA THA; PMH: lumbar laminectomy, cervical fusion, HTN, Hep C  Precautions  Precautions Fall  Restrictions  LLE Weight Bearing WBAT  Pain Assessment  Pain Assessment 0-10  Pain Score 4  Pain Location left hip  Pain Descriptors / Indicators Sore;Aching;Discomfort  Pain Intervention(s) Limited activity within patient's tolerance;Monitored during session;Premedicated before session;Ice applied  Cognition  Arousal/Alertness Awake/alert  Behavior During Therapy WFL for tasks assessed/performed  Overall Cognitive Status Within Functional Limits for tasks assessed  Total Joint Exercises  Ankle Circles/Pumps AROM;Both;10 reps  Quad Sets AROM;Both;10 reps  Heel Slides AROM;Left;10 reps  Hip ABduction/ADduction AROM;Left;10 reps  Short Arc Quad AROM;Left;10 reps  Long Arc Quad AROM;Left;10 reps  PT - End of Session  Equipment Utilized During Treatment Gait belt  Activity Tolerance Patient tolerated treatment well  Patient left in chair;with call bell/phone within reach;with chair alarm set;with family/visitor present   PT - Assessment/Plan  PT Plan Current plan remains appropriate  PT Visit Diagnosis Difficulty in walking, not elsewhere classified (R26.2)  PT Frequency (ACUTE ONLY) 7X/week  Follow Up Recommendations Follow surgeon's recommendation for DC plan and follow-up therapies  PT equipment None recommended by PT  AM-PAC PT "6 Clicks" Mobility Outcome Measure (Version 2)  Help needed turning from your back to your side while in a flat bed without using bedrails? 4  Help needed moving from lying on your back to sitting on the side of a flat bed without using bedrails? 4  Help needed moving to and from a bed to a chair (including a wheelchair)? 3  Help needed standing up from a chair using your arms (e.g.,  wheelchair or bedside chair)? 3  Help needed to walk in hospital room? 3  Help needed climbing 3-5 steps with a railing?  3  6 Click Score 20  Consider Recommendation of Discharge To: Home with no services  PT Goal Progression  Progress towards PT goals Progressing toward goals  Acute Rehab PT Goals  PT Goal Formulation With patient  Time For Goal Achievement 11/25/18  Potential to Achieve Goals Good  PT Time Calculation  PT Start Time (ACUTE ONLY) 1309  PT Stop Time (ACUTE ONLY) 1329  PT Time Calculation (min) (ACUTE ONLY) 20 min  PT General Charges  $$ ACUTE PT VISIT 1 Visit  PT Treatments  $Therapeutic Exercise 8-22 mins

## 2018-11-19 NOTE — Progress Notes (Signed)
Subjective: 1 Day Post-Op Procedure(s) (LRB): Left Anterior Hip Arthroplasty (Left)   Patient doing well and is looking forward to going home.  Activity level:  wbat Diet tolerance:  ok Voiding:  ok Patient reports pain as mild.    Objective: Vital signs in last 24 hours: Temp:  [97.5 F (36.4 C)-98.3 F (36.8 C)] 98 F (36.7 C) (08/05 0407) Pulse Rate:  [63-89] 79 (08/05 0407) Resp:  [10-20] 17 (08/05 0407) BP: (104-150)/(73-93) 139/84 (08/05 0407) SpO2:  [97 %-100 %] 98 % (08/05 0407) Weight:  [94.8 kg] 94.8 kg (08/04 0758)  Labs: No results for input(s): HGB in the last 72 hours. No results for input(s): WBC, RBC, HCT, PLT in the last 72 hours. No results for input(s): NA, K, CL, CO2, BUN, CREATININE, GLUCOSE, CALCIUM in the last 72 hours. No results for input(s): LABPT, INR in the last 72 hours.  Physical Exam:  Neurologically intact ABD soft Neurovascular intact Sensation intact distally Intact pulses distally Dorsiflexion/Plantar flexion intact Incision: dressing C/D/I and no drainage No cellulitis present Compartment soft  Assessment/Plan:  1 Day Post-Op Procedure(s) (LRB): Left Anterior Hip Arthroplasty (Left) Advance diet Up with therapy D/C IV fluids Discharge home with home health today after PT. Continue on asa81mg  BID x 4 weeks post op. Follow up in office 2 weeks post op.    Greg Flowers Greg Flowers 11/19/2018, 7:18 AM

## 2018-11-19 NOTE — TOC Transition Note (Signed)
Transition of Care [facility]) - CM/SW Discharge Note   Patient Details  Name: HISASHI AMADON MRN: [id number] Date of Birth: 14-Aug-1955  [facility] CM/SW Contact:  Leeroy Cha, RN Phone Number: 11/19/2018, 10:34 AM   Clinical Narrative:    dcd to home with [facility] and equip.   Final next level of care: [place] Barriers to Discharge: No Barriers Identified   Patient Goals and CMS Choice Patient states their goals for this hospitalization and ongoing recovery are:: to go home and do the therapy and get moving CMS Medicare.gov Compare Post Acute Care list provided to:: Patient Choice offered to / list presented to : Patient  Discharge Placement                       Discharge Plan and Services   Discharge Planning Services: CM Consult Post Acute Care Choice: Home Health, Durable Medical Equipment          DME Arranged: Walker rolling, 3-N-1 DME Agency: [employer] Date DME Agency Contacted: 11/17/18 Time DME Agency Contacted: 0900 Representative spoke with at DME Agency: nathan HH Arranged: PT [place]: [facility] [employer] Date [place]: 11/19/18 Time [place]: 0830 Representative spoke with at [place]: mike  Social Determinants of Health ([place]) Interventions     Readmission Risk Interventions No flowsheet data found.

## 2018-11-19 NOTE — Progress Notes (Signed)
Physical Therapy Treatment Patient Details Name: Greg Flowers MRN: [id number] DOB: 1955/08/21 Today's Date: 11/19/2018    History of Present Illness s/p L DA THA; PMH: lumbar laminectomy, cervical fusion, HTN, Hep C    PT Comments    Pt progressing well; will see for a second session to review HEP then should be ready for d/c   Follow Up Recommendations  Follow surgeon's recommendation for DC plan and follow-up therapies     Equipment Recommendations  None recommended by PT    Recommendations for Other Services       Precautions / Restrictions Precautions Precautions: Fall Restrictions Weight Bearing Restrictions: No LLE Weight Bearing: Weight bearing as tolerated    Mobility  Bed Mobility Overal bed mobility: Needs Assistance Bed Mobility: Supine to Sit     Supine to sit: Supervision     General bed mobility comments: for safety  Transfers Overall transfer level: Needs assistance Equipment used: Rolling walker (2 wheeled) Transfers: Sit to/from Stand Sit to Stand: Min guard;Supervision         General transfer comment: cues for hand placement  Ambulation/Gait Ambulation/Gait assistance: Min guard;Supervision Gait Distance (Feet): 160 Feet(x2) Assistive device: Rolling walker (2 wheeled) Gait Pattern/deviations: Step-to pattern;Decreased weight shift to left     General Gait Details: cues for sequence and RW position, trunk extension   Stairs Stairs: Yes Stairs assistance: Min guard Stair Management: One rail Right;One rail Left;Sideways;Step to pattern Number of Stairs: 3 General stair comments: cues for sequence and technique   Wheelchair Mobility    Modified Rankin (Stroke Patients Only)       Balance                                            Cognition Arousal/Alertness: Awake/alert Behavior During Therapy: WFL for tasks assessed/performed Overall Cognitive Status: Within Functional Limits for tasks assessed                                         Exercises      General Comments        Pertinent Vitals/Pain Pain Assessment: 0-10 Pain Score: 3  Pain Location: left hip Pain Descriptors / Indicators: Sore;Aching;Discomfort Pain Intervention(s): Limited activity within patient's tolerance;Monitored during session    Home Living                      Prior Function            PT Goals (current goals can now be found in the care plan section) Acute Rehab PT Goals PT Goal Formulation: With patient Time For Goal Achievement: 11/25/18 Potential to Achieve Goals: Good Progress towards PT goals: Progressing toward goals    Frequency    7X/week      PT Plan Current plan remains appropriate    Co-evaluation              AM-PAC PT "6 Clicks" Mobility   Outcome Measure  Help needed turning from your back to your side while in a flat bed without using bedrails?: None Help needed moving from lying on your back to sitting on the side of a flat bed without using bedrails?: None Help needed moving to and from a bed to a chair (including a wheelchair)?:  A Little Help needed standing up from a chair using your arms (e.g., wheelchair or bedside chair)?: A Little Help needed to walk in hospital room?: A Little Help needed climbing 3-5 steps with a railing? : A Little 6 Click Score: 20    End of Session Equipment Utilized During Treatment: Gait belt Activity Tolerance: Patient tolerated treatment well Patient left: in chair;with call bell/phone within reach;with chair alarm set   PT Visit Diagnosis: Difficulty in walking, not elsewhere classified (R26.2)     Time: 1537-9432 PT Time Calculation (min) (ACUTE ONLY): 25 min  Charges:  $Gait Training: 23-37 mins                     Kenyon Ana, PT  Pager: [phone] Acute Rehab Dept [facility]): [phone]   11/19/2018    [facility] 11/19/2018, 10:36 AM

## 2018-11-21 DIAGNOSIS — F419 Anxiety disorder, unspecified: Secondary | ICD-10-CM | POA: Diagnosis not present

## 2018-11-21 DIAGNOSIS — M47895 Other spondylosis, thoracolumbar region: Secondary | ICD-10-CM | POA: Diagnosis not present

## 2018-11-21 DIAGNOSIS — Z96642 Presence of left artificial hip joint: Secondary | ICD-10-CM | POA: Diagnosis not present

## 2018-11-21 DIAGNOSIS — K219 Gastro-esophageal reflux disease without esophagitis: Secondary | ICD-10-CM | POA: Diagnosis not present

## 2018-11-21 DIAGNOSIS — Z87891 Personal history of nicotine dependence: Secondary | ICD-10-CM | POA: Diagnosis not present

## 2018-11-21 DIAGNOSIS — M170 Bilateral primary osteoarthritis of knee: Secondary | ICD-10-CM | POA: Diagnosis not present

## 2018-11-21 DIAGNOSIS — F329 Major depressive disorder, single episode, unspecified: Secondary | ICD-10-CM | POA: Diagnosis not present

## 2018-11-21 DIAGNOSIS — Z471 Aftercare following joint replacement surgery: Secondary | ICD-10-CM | POA: Diagnosis not present

## 2018-11-21 DIAGNOSIS — M47813 Spondylosis without myelopathy or radiculopathy, cervicothoracic region: Secondary | ICD-10-CM | POA: Diagnosis not present

## 2018-11-28 DIAGNOSIS — M1611 Unilateral primary osteoarthritis, right hip: Secondary | ICD-10-CM | POA: Diagnosis not present

## 2018-11-28 DIAGNOSIS — M1612 Unilateral primary osteoarthritis, left hip: Secondary | ICD-10-CM | POA: Diagnosis not present

## 2018-12-01 DIAGNOSIS — F419 Anxiety disorder, unspecified: Secondary | ICD-10-CM | POA: Diagnosis not present

## 2018-12-01 DIAGNOSIS — Z87891 Personal history of nicotine dependence: Secondary | ICD-10-CM | POA: Diagnosis not present

## 2018-12-01 DIAGNOSIS — M47895 Other spondylosis, thoracolumbar region: Secondary | ICD-10-CM | POA: Diagnosis not present

## 2018-12-01 DIAGNOSIS — K219 Gastro-esophageal reflux disease without esophagitis: Secondary | ICD-10-CM | POA: Diagnosis not present

## 2018-12-01 DIAGNOSIS — F329 Major depressive disorder, single episode, unspecified: Secondary | ICD-10-CM | POA: Diagnosis not present

## 2018-12-01 DIAGNOSIS — M47813 Spondylosis without myelopathy or radiculopathy, cervicothoracic region: Secondary | ICD-10-CM | POA: Diagnosis not present

## 2018-12-01 DIAGNOSIS — Z96642 Presence of left artificial hip joint: Secondary | ICD-10-CM | POA: Diagnosis not present

## 2018-12-01 DIAGNOSIS — M170 Bilateral primary osteoarthritis of knee: Secondary | ICD-10-CM | POA: Diagnosis not present

## 2018-12-01 DIAGNOSIS — Z471 Aftercare following joint replacement surgery: Secondary | ICD-10-CM | POA: Diagnosis not present

## 2018-12-04 ENCOUNTER — Ambulatory Visit: Payer: Federal, State, Local not specified - PPO | Attending: Orthopaedic Surgery | Admitting: Physical Therapy

## 2018-12-04 ENCOUNTER — Other Ambulatory Visit: Payer: Self-pay

## 2018-12-04 ENCOUNTER — Encounter: Payer: Self-pay | Admitting: Physical Therapy

## 2018-12-04 DIAGNOSIS — M6281 Muscle weakness (generalized): Secondary | ICD-10-CM | POA: Diagnosis not present

## 2018-12-04 DIAGNOSIS — M25552 Pain in left hip: Secondary | ICD-10-CM

## 2018-12-04 DIAGNOSIS — R262 Difficulty in walking, not elsewhere classified: Secondary | ICD-10-CM | POA: Diagnosis not present

## 2018-12-04 DIAGNOSIS — R293 Abnormal posture: Secondary | ICD-10-CM | POA: Insufficient documentation

## 2018-12-04 DIAGNOSIS — M25652 Stiffness of left hip, not elsewhere classified: Secondary | ICD-10-CM | POA: Diagnosis not present

## 2018-12-04 NOTE — Patient Instructions (Signed)
Access Code: [email or web address]  URL: [email or web address]  Date: 12/04/2018  Prepared by: Elsie Ra   Exercises  Seated Hamstring Stretch - 3 sets - 30 hold - 2x daily - 6x weekly  Seated Long Arc Quad - 10 reps - 2-3 sets - 2x daily - 6x weekly  Sit to Stand - 10 reps - 3 sets - 2x daily - 6x weekly  Standing Hip Abduction - 10 reps - 1-3 sets - 2x daily - 6x weekly  Standing Marching - 10 reps - 1-3 sets - 2x daily - 6x weekly  Heel Toe Raises with Unilateral Counter Support - 10 reps - 3 sets - 2x daily - 6x weekly

## 2018-12-04 NOTE — Therapy (Signed)
[facility] [address] [place], [place], [address] Phone: [phone]   Fax:  [phone]  Physical Therapy Evaluation  Patient Details  Name: Greg Flowers Greg Flowers MRN: [id number] Date of Birth: 1955-08-07 Referring Provider (PT): Greg Flowers, Greg Flowers   Encounter Date: 12/04/2018  PT End of Session - 12/04/18 1518    Visit Number  1    Number of Visits  16    Date for PT Re-Evaluation  01/15/19    Authorization Type  BCBS federal    PT Start Time  1400    PT Stop Time  1450    PT Time Calculation (min)  50 min    Activity Tolerance  Patient tolerated treatment well    Behavior During Therapy  [facility] for tasks assessed/performed       Past Medical History:  Diagnosis Date  . Anxiety   . Calculus, kidney 2000   Sees Greg Flowers, urology.   . Cervical spondylosis 2004   sp surgery by Greg. Danielle Flowers  . Depression   . GERD (gastroesophageal reflux disease)   . Headache(784.0)    Chronic, on vicodin 5 TID for this.   . Hepatitis C    Has occasional visits with Greg Flowers GI, failed RX in 2000.  Liver Biopsy 9/06: Minimally active hepatitis consistent with hepatitis C, minimal necroinflamtory activity grade 1, no incrased fibrosis stage 0.   . Herpes labialis   . History of kidney stones   . Hypertension   . Pneumonia   . Renal stone   . Spondylolisthesis of lumbar region   . Thalassemia    HGB 9/09 14.4 wtih MCV 72.6  . Wears glasses     Past Surgical History:  Procedure Laterality Date  . ANTERIOR CERVICAL DECOMP/DISCECTOMY FUSION N/A 11/21/2017   Procedure: ANTERIOR CERVICAL DECOMPRESSION FUSION, CERVICAL THREE-FOUR, CERVICAL FOUR-FIVE WITH INSTRUMENTATION AND ALLOGRAFT;  Surgeon: Greg Flowers, Mark, MD;  Location: [facility] OR;  Service: Orthopedics;  Laterality: N/A;  ANTERIOR CERVICAL DECOMPRESSION FUSION, CERVICAL THREE-FOUR, CERVICAL FOUR-FIVE WITH INSTRUMENTATION AND ALLOGRAFT  . BACK SURGERY  2017   L4-L5 PLATE/SCREWS  . CERVICAL  DISCECTOMY  2004   Greg Flowers  . COLONOSCOPY    . LITHOTRIPSY  2004   Greg Flowers  . NASAL SEPTUM SURGERY  2007  . TONSILLECTOMY    . TOTAL HIP ARTHROPLASTY Left 11/18/2018   Procedure: Left Anterior Hip Arthroplasty;  Surgeon: Greg Flowers, Peter, MD;  Location: [facility]S;  Service: Orthopedics;  Laterality: Left;    There were no vitals filed for this visit.   Subjective Assessment - 12/04/18 1416    Subjective  He had Lt TKA on 11/18/18. he had HHPT and now is referred to outpaitient PT. He is having pain and difficulty with pain, standing tolerance, using walker currently, and sleeping, . He is retired from Forensic scientistpost office, likes working out in garden and fishing.    Pertinent History  Lt THA 11/18/18, cervical ACDF 2019, back surgery 2017,    How long can you stand comfortably?  few minutes only    Currently in Pain?  Yes    Pain Score  5     Pain Location  Hip    Pain Orientation  Left;Anterior    Pain Descriptors / Indicators  Aching;Sore    Pain Type  Surgical pain    Pain Radiating Towards  down Lt thigh    Pain Onset  1 to 4 weeks ago    Pain Frequency  Constant  Aggravating Factors   LE dressing, standing    Pain Relieving Factors  meds    Effect of Pain on Daily Activities  limits most ALDs involving using his hip         [facility] - 12/04/18 0001      Assessment   Medical Diagnosis  Lt THA anterior approach 11/18/18    Referring Provider (PT)  Greg Flowers, Greg Flowers    Onset Date/Surgical Date  11/18/18    Next MD Visit  12/30/18    Prior Therapy  PT in past for neck and back       Precautions   Precautions  Anterior Hip      Restrictions   Weight Bearing Restrictions  No      Balance Screen   Has the patient fallen in the past 6 months  No      Home Environment   Living Environment  Private residence    Additional Comments  3 steps to enter, 16 steps in house       Prior Function   Level of Independence  Independent   before THA, now needs assistance with most  ADLs   Vocation  Retired    Electrical engineerLeisure  yardwork, workout, fish      Cognition   Overall Cognitive Status  Within Functional Limits for tasks assessed      Observation/Other Assessments   Focus on Therapeutic Outcomes (FOTO)   74% limited      Sensation   Light Touch  Appears Intact      Coordination   Gross Motor Movements are Fluid and Coordinated  Yes      ROM / Strength   AROM / PROM / Strength  AROM;Strength;PROM      AROM   AROM Assessment Site  Hip    Right/Left Hip  Left    Left Hip Flexion  80    Left Hip External Rotation   20    Left Hip Internal Rotation   17    Left Hip ABduction  15    Left Hip ADduction  20      PROM   Overall PROM Comments  PROM Lt hip grossly 50-75%       Strength   Overall Strength Comments  Lt hip at least 3/5, did not test due to recent post op status, Lt knee 5/5 MMT      Transfers   Transfers  Independent with all Transfers    Five time sit to stand comments   18 seconds    Comments  increased time, needs RW currently      Ambulation/Gait   Ambulation/Gait  Yes    Ambulation/Gait Assistance  6: Modified independent (Device/Increase time)    Ambulation Distance (Feet)  100 Feet    Assistive device  Rolling walker    Gait Pattern  Decreased step length - right;Decreased step length - left;Decreased stance time - left;Decreased hip/knee flexion - left    Gait Comments  RW was too tall for him and ht was properly adjusted                Objective measurements completed on examination: See above findings.      [facility]/Exercise - 12/04/18 0001      Manual Therapy   Manual therapy comments  PROM Lt hip all planes to tolerance             PT Education - 12/04/18 1517    Education  Details  HEP, POC    Person(s) Educated  Patient    Methods  Explanation;Demonstration;Verbal cues    Comprehension  Verbalized understanding;Need further instruction          PT Long Term Goals - 12/04/18 1539       PT LONG TERM GOAL #1   Title  Pt will be I and compliant with HEP. (Target goal for all goals 6 weeks 01/15/19)    Status  New      PT LONG TERM GOAL #2   Title  Pt will improve ROM to [facility] to improve mobility    Baseline  50% ROM available post op    Status  New      PT LONG TERM GOAL #3   Title  He will report pain decreased 50% or more  in LT hip with normal activity including yardwork    Baseline  5/10    Status  New      PT LONG TERM GOAL #4   Title  Pt will improve Lt hip strength to at least 5-/5 MMT to improve function    Status  New      PT LONG TERM GOAL #5   Title  He will improve 5TSTS test to 12 seconds or less    Status  New             Plan - 12/04/18 1529    Clinical Impression Statement  Pt presents with Lt THA anterior approach on 11/18/18. Currently using RW for ambulation but did not need AD prior.  He has decreased Lt hip ROM, decreased strength, decreased activity tolerance  with ADL's, and particularly with standing or walking, and increased pain limiting functional abilities. He will benefit from skilled PT to address his deficits.    Personal Factors and Comorbidities  Comorbidity 1;Comorbidity 2;Comorbidity 3+    Comorbidities  cervical ACDF 2019, back surgery 2017, HTN    Examination-Activity Limitations  Bathing;Transfers;Locomotion Level;Carry;Sleep;Dressing;Stairs;Squat;Stand;Lift    Examination-Participation Restrictions  Cleaning;Meal Prep;Community Activity;Driving;Laundry;Yard Work;Shop    Stability/Clinical Decision Making  Evolving/Moderate complexity    Clinical Decision Making  Moderate    Rehab Potential  Good    PT Frequency  Other (comment)   2-3   PT Duration  6 weeks    PT Treatment/Interventions  Aquatic Therapy;Electrical Stimulation;Cryotherapy;Iontophoresis 4mg /ml Dexamethasone;Moist Heat;Ultrasound;Gait training;Stair training;Therapeutic activities;Therapeutic exercise;Balance training;Neuromuscular re-education;Passive  range of motion;Dry needling;Joint Manipulations    PT Next Visit Plan  reivew HEP and progress as needed, needs Lt hip stretching and strengthening, gait and stair training    PT Home Exercise Plan  standing march and hip abd, hamstring stretch, sit to stands, heel toe raises, LAQ    Consulted and Agree with Plan of Care  Patient       Patient will benefit from skilled therapeutic intervention in order to improve the following deficits and impairments:  Abnormal gait, Decreased activity tolerance, Decreased balance, Decreased mobility, Decreased range of motion, Decreased strength, Difficulty walking, Impaired flexibility, Pain  Visit Diagnosis: Pain in left hip  Difficulty in walking, not elsewhere classified  Muscle weakness (generalized)  Stiffness of left hip, not elsewhere classified     Problem List Patient Active Problem List   Diagnosis Date Noted  . Primary localized osteoarthritis of left hip 11/18/2018  . Primary osteoarthritis of left hip 11/18/2018  . Left hip pain secondary to OA  08/13/2018  . Microcytic anemia 08/13/2018  . Urinary retention 11/08/2017  . Liver fibrosis 04/30/2017  .  NSAID long-term use 01/09/2016  . Chronic back pain secondary to DJD of cervical, thoracic and lumbar spine and disc herniation 01/09/2016  . Osteoarthritis of both knees 11/02/2011  . FOOT PAIN, BILATERAL 09/20/2009  . DISORDER, DEPRESSIVE NEC 09/25/2006  . HERPES LABIALIS 04/04/2006  . Chronic hepatitis C without hepatic coma (HCC) 02/22/2006  . Essential hypertension 02/22/2006    April MansonBrian R Nelson ,PT,DPT 12/04/2018, 3:47 PM  [facility] [address] [place], [place], [address] Phone: [phone]   Fax:  [phone]  Name: Greg Flowers MRN: [id number] Date of Birth: 30-Dec-1955

## 2018-12-09 ENCOUNTER — Encounter: Payer: Self-pay | Admitting: Physical Therapy

## 2018-12-09 ENCOUNTER — Other Ambulatory Visit: Payer: Self-pay

## 2018-12-09 ENCOUNTER — Ambulatory Visit: Payer: Federal, State, Local not specified - PPO | Admitting: Physical Therapy

## 2018-12-09 DIAGNOSIS — M6281 Muscle weakness (generalized): Secondary | ICD-10-CM | POA: Diagnosis not present

## 2018-12-09 DIAGNOSIS — R293 Abnormal posture: Secondary | ICD-10-CM

## 2018-12-09 DIAGNOSIS — M25652 Stiffness of left hip, not elsewhere classified: Secondary | ICD-10-CM | POA: Diagnosis not present

## 2018-12-09 DIAGNOSIS — R262 Difficulty in walking, not elsewhere classified: Secondary | ICD-10-CM | POA: Diagnosis not present

## 2018-12-09 DIAGNOSIS — M25552 Pain in left hip: Secondary | ICD-10-CM | POA: Diagnosis not present

## 2018-12-09 NOTE — Patient Instructions (Signed)
Hip Flexor Stretch    Lying on back near edge of bed, bend one leg, foot flat. Hang other leg over edge, relaxed, thigh resting entirely on bed for __30 seconds. Repeat _3___ times. Do __1-2__ sessions per day.  Advanced Exercise: Bend knee back keeping thigh in contact with bed. (NOT YET)

## 2018-12-09 NOTE — Therapy (Signed)
[facility] [address] [place], [place], [address] Phone: [phone]   Fax:  [phone]  Physical Therapy Treatment  Patient Details  Name: Greg Flowers MRN: [id number] Date of Birth: [birth year] Referring Provider (PT): Marcene Corning   Encounter Date: 12/09/2018  PT End of Session - 12/09/18 1318    Visit Number  2    Number of Visits  16    Date for PT Re-Evaluation  01/15/19    PT Start Time  1315    PT Stop Time  1405    PT Time Calculation (min)  50 min       Past Medical History:  Diagnosis Date  . Anxiety   . Calculus, kidney 2000   Sees Dr. Isabel Caprice, urology.   . Cervical spondylosis 2004   sp surgery by Dr. Danielle Dess  . Depression   . GERD (gastroesophageal reflux disease)   . Headache(784.0)    Chronic, on vicodin 5 TID for this.   . Hepatitis C    Has occasional visits with Dr. Randa Evens GI, failed RX in 2000.  Liver Biopsy 9/06: Minimally active hepatitis consistent with hepatitis C, minimal necroinflamtory activity grade 1, no incrased fibrosis stage 0.   . Herpes labialis   . History of kidney stones   . Hypertension   . Pneumonia   . Renal stone   . Spondylolisthesis of lumbar region   . Thalassemia    HGB 9/09 14.4 wtih MCV 72.6  . Wears glasses     Past Surgical History:  Procedure Laterality Date  . ANTERIOR CERVICAL DECOMP/DISCECTOMY FUSION N/A 11/21/2017   Procedure: ANTERIOR CERVICAL DECOMPRESSION FUSION, CERVICAL THREE-FOUR, CERVICAL FOUR-FIVE WITH INSTRUMENTATION AND ALLOGRAFT;  Surgeon: Estill Bamberg, MD;  Location: [facility] OR;  Service: Orthopedics;  Laterality: N/A;  ANTERIOR CERVICAL DECOMPRESSION FUSION, CERVICAL THREE-FOUR, CERVICAL FOUR-FIVE WITH INSTRUMENTATION AND ALLOGRAFT  . BACK SURGERY  2017   L4-L5 PLATE/SCREWS  . CERVICAL DISCECTOMY  2004   Dr Danielle Dess  . COLONOSCOPY    . LITHOTRIPSY  2004   Dr Logan Bores  . NASAL SEPTUM SURGERY  2007  . TONSILLECTOMY    . TOTAL HIP ARTHROPLASTY  Left 11/18/2018   Procedure: Left Anterior Hip Arthroplasty;  Surgeon: Marcene Corning, MD;  Location: [facility];  Service: Orthopedics;  Laterality: Left;    There were no vitals filed for this visit.  Subjective Assessment - 12/09/18 1317    Subjective  Having a hard time, Still sick from the anestesia.    Currently in Pain?  Yes    Pain Score  5     Pain Location  Hip    Pain Orientation  Left;Anterior    Pain Descriptors / Indicators  Aching;Sore    Aggravating Factors   LE dressing, standing    Pain Relieving Factors  meds         [facility] PT Assessment - 12/09/18 0001      Assessment   Next MD Visit  12/30/18                   [facility] Adult PT Treatment/Exercise - 12/09/18 0001      Knee/Hip Exercises: Stretches   Active Hamstring Stretch  3 reps;20 seconds    Hip Flexor Stretch  Left;3 reps;30 seconds    Hip Flexor Stretch Limitations  left dangle , with upper thigh supported       Knee/Hip Exercises: Aerobic   Nustep  L4 UE/LE x 5 minutes  Knee/Hip Exercises: Standing   Heel Raises  20 reps    Hip Flexion  20 reps    Hip Flexion Limitations  marching       Knee/Hip Exercises: Seated   Long Arc Quad  20 reps      Knee/Hip Exercises: Supine   Quad Sets  10 reps    Short Arc Quad Sets  20 reps    Heel Slides  10 reps    Bridges  15 reps    Other Supine Knee/Hip Exercises  ball squeeze x 15, clam shells red band       Modalities   Modalities  Cryotherapy      Cryotherapy   Number Minutes Cryotherapy  10 Minutes    Cryotherapy Location  Hip    Type of Cryotherapy  Ice pack      Manual Therapy   Manual therapy comments  --             PT Education - 12/09/18 1353    Education Details  HEP    Person(s) Educated  Patient    Methods  Explanation    Comprehension  Verbalized understanding;Tactile cues required          PT Long Term Goals - 12/04/18 1539      PT LONG TERM GOAL #1   Title  Pt will be I and compliant with HEP.  (Target goal for all goals 6 weeks 01/15/19)    Status  New      PT LONG TERM GOAL #2   Title  Pt will improve ROM to [facility] to improve mobility    Baseline  50% ROM available post op    Status  New      PT LONG TERM GOAL #3   Title  He will report pain decreased 50% or more  in LT hip with normal activity including yardwork    Baseline  5/10    Status  New      PT LONG TERM GOAL #4   Title  Pt will improve Lt hip strength to at least 5-/5 MMT to improve function    Status  New      PT LONG TERM GOAL #5   Title  He will improve 5TSTS test to 12 seconds or less    Status  New            Plan - 12/09/18 1353    Clinical Impression Statement  Pt arrives with RW and reports 5/10 hip pain. Reviewed HEP and began Nustep. Progressed with gentle hip strength and gentle hip flexor stretching, Updated HEP for hip flexor stetch. ICE pack post session.    PT Next Visit Plan  reivew HEP and progress as needed, needs Lt hip stretching and strengthening, gait and stair training Next MD appt 12/17/18    PT Home Exercise Plan  standing march and hip abd, hamstring stretch, sit to stands, heel toe raises, LAQ       Patient will benefit from skilled therapeutic intervention in order to improve the following deficits and impairments:  Abnormal gait, Decreased activity tolerance, Decreased balance, Decreased mobility, Decreased range of motion, Decreased strength, Difficulty walking, Impaired flexibility, Pain  Visit Diagnosis: Pain in left hip  Difficulty in walking, not elsewhere classified  Stiffness of left hip, not elsewhere classified  Abnormal posture     Problem List Patient Active Problem List   Diagnosis Date Noted  . Primary localized osteoarthritis of left hip 11/18/2018  .  Primary osteoarthritis of left hip 11/18/2018  . Left hip pain secondary to OA  08/13/2018  . Microcytic anemia 08/13/2018  . Urinary retention 11/08/2017  . Liver fibrosis 04/30/2017  . NSAID long-term  use 01/09/2016  . Chronic back pain secondary to DJD of cervical, thoracic and lumbar spine and disc herniation 01/09/2016  . Osteoarthritis of both knees 11/02/2011  . FOOT PAIN, BILATERAL 09/20/2009  . DISORDER, DEPRESSIVE NEC 09/25/2006  . HERPES LABIALIS 04/04/2006  . Chronic hepatitis C without hepatic coma ([place]) 02/22/2006  . Essential hypertension 02/22/2006    Dorene Ar, PTA 12/09/2018, 2:01 PM  [facility] [address] [place], [place], [address] Phone: [phone]   Fax:  [phone]  Name: Greg Flowers MRN: [id number] Date of Birth: 10-Feb-1956

## 2018-12-11 ENCOUNTER — Encounter: Payer: Self-pay | Admitting: Physical Therapy

## 2018-12-11 ENCOUNTER — Other Ambulatory Visit: Payer: Self-pay

## 2018-12-11 ENCOUNTER — Ambulatory Visit: Payer: Federal, State, Local not specified - PPO | Admitting: Physical Therapy

## 2018-12-11 DIAGNOSIS — R262 Difficulty in walking, not elsewhere classified: Secondary | ICD-10-CM

## 2018-12-11 DIAGNOSIS — M6281 Muscle weakness (generalized): Secondary | ICD-10-CM | POA: Diagnosis not present

## 2018-12-11 DIAGNOSIS — M25552 Pain in left hip: Secondary | ICD-10-CM | POA: Diagnosis not present

## 2018-12-11 DIAGNOSIS — R293 Abnormal posture: Secondary | ICD-10-CM

## 2018-12-11 DIAGNOSIS — M25652 Stiffness of left hip, not elsewhere classified: Secondary | ICD-10-CM | POA: Diagnosis not present

## 2018-12-11 NOTE — Therapy (Signed)
[facility] [address] [place], [place], [address] Phone: [phone]   Fax:  [phone]  Physical Therapy Treatment  Patient Details  Name: Greg Flowers MRN: [id number] Date of Birth: [birth year] Referring Provider (PT): Marcene Corningalldorf, Peter   Encounter Date: 12/11/2018  PT End of Session - 12/11/18 1338    Visit Number  3    Number of Visits  16    Date for PT Re-Evaluation  01/15/19    Authorization Type  BCBS federal    PT Start Time  1332    PT Stop Time  1421    PT Time Calculation (min)  49 min    Activity Tolerance  Patient tolerated treatment well    Behavior During Therapy  [facility] for tasks assessed/performed       Past Medical History:  Diagnosis Date  . Anxiety   . Calculus, kidney 2000   Sees Dr. Isabel CapriceGrapey, urology.   . Cervical spondylosis 2004   sp surgery by Dr. Danielle DessElsner  . Depression   . GERD (gastroesophageal reflux disease)   . Headache(784.0)    Chronic, on vicodin 5 TID for this.   . Hepatitis C    Has occasional visits with Dr. Randa EvensEdwards GI, failed RX in 2000.  Liver Biopsy 9/06: Minimally active hepatitis consistent with hepatitis C, minimal necroinflamtory activity grade 1, no incrased fibrosis stage 0.   . Herpes labialis   . History of kidney stones   . Hypertension   . Pneumonia   . Renal stone   . Spondylolisthesis of lumbar region   . Thalassemia    HGB 9/09 14.4 wtih MCV 72.6  . Wears glasses     Past Surgical History:  Procedure Laterality Date  . ANTERIOR CERVICAL DECOMP/DISCECTOMY FUSION N/A 11/21/2017   Procedure: ANTERIOR CERVICAL DECOMPRESSION FUSION, CERVICAL THREE-FOUR, CERVICAL FOUR-FIVE WITH INSTRUMENTATION AND ALLOGRAFT;  Surgeon: Estill Bambergumonski, Mark, MD;  Location: [facility];  Service: Orthopedics;  Laterality: N/A;  ANTERIOR CERVICAL DECOMPRESSION FUSION, CERVICAL THREE-FOUR, CERVICAL FOUR-FIVE WITH INSTRUMENTATION AND ALLOGRAFT  . BACK SURGERY  2017   L4-L5 PLATE/SCREWS  . CERVICAL  DISCECTOMY  2004   Dr Danielle DessElsner  . COLONOSCOPY    . LITHOTRIPSY  2004   Dr Logan BoresEvans  . NASAL SEPTUM SURGERY  2007  . TONSILLECTOMY    . TOTAL HIP ARTHROPLASTY Left 11/18/2018   Procedure: Left Anterior Hip Arthroplasty;  Surgeon: Marcene Corningalldorf, Peter, MD;  Location: [facility];  Service: Orthopedics;  Laterality: Left;    There were no vitals filed for this visit.  Subjective Assessment - 12/11/18 1338    Subjective  "I am still having issues with appetite"    Currently in Pain?  Yes    Pain Score  5    last took muscle relaxer before coming   Pain Orientation  Left    Pain Descriptors / Indicators  Aching;Sore    Pain Type  Chronic pain         [facility] PT Assessment - 12/11/18 0001      Assessment   Medical Diagnosis  Lt THA anterior approach 11/18/18    Referring Provider (PT)  Marcene Corningalldorf, Peter                   [facility]/Exercise - 12/11/18 0001      Self-Care   Self-Care  Other Self-Care Comments    Other Self-Care Comments   MTPR along the quad and DTM       Knee/Hip  Exercises: Stretches   Active Hamstring Stretch  2 reps;30 seconds    Hip Flexor Stretch  1 rep;30 seconds;Left   in supine   Hip Flexor Stretch Limitations  2 x 30 sec standing stretch      Knee/Hip Exercises: Aerobic   Nustep  L4 x 5 min LE only      Knee/Hip Exercises: Standing   Forward Step Up  2 sets;10 reps;Left;Step Height: 4"   with bil HHA for safety     Knee/Hip Exercises: Seated   Sit to Sand  2 sets;10 reps;without UE support   from-hi-lo table lowered on second set     Knee/Hip Exercises: Supine   Heel Slides  1 set;10 reps;Left      Cryotherapy   Number Minutes Cryotherapy  10 Minutes    Cryotherapy Location  Hip    Type of Cryotherapy  Ice pack      Manual Therapy   Manual Therapy  Soft tissue mobilization    Manual therapy comments  MTPR along the L rectus femoris    Soft tissue mobilization  DTM along the rectus femoris                  PT Long  Term Goals - 12/04/18 1539      PT LONG TERM GOAL #1   Title  Pt will be I and compliant with HEP. (Target goal for all goals 6 weeks 01/15/19)    Status  New      PT LONG TERM GOAL #2   Title  Pt will improve ROM to [facility] to improve mobility    Baseline  50% ROM available post op    Status  New      PT LONG TERM GOAL #3   Title  He will report pain decreased 50% or more  in LT hip with normal activity including yardwork    Baseline  5/10    Status  New      PT LONG TERM GOAL #4   Title  Pt will improve Lt hip strength to at least 5-/5 MMT to improve function    Status  New      PT LONG TERM GOAL #5   Title  He will improve 5TSTS test to 12 seconds or less    Status  New            Plan - 12/11/18 1414    Clinical Impression Statement  pt continues to report stomach upset and pain inthe hip. reviewed HEP and continued hip stretching and strengthening progressing to CKC activities. . continued using ice end of session.    PT Treatment/Interventions  Aquatic Therapy;Electrical Stimulation;Cryotherapy;Iontophoresis 4mg /ml Dexamethasone;Moist Heat;Ultrasound;Gait training;Stair training;Therapeutic activities;Therapeutic exercise;Balance training;Neuromuscular re-education;Passive range of motion;Dry needling;Joint Manipulations    PT Next Visit Plan  reivew HEP and progress as needed, needs Lt hip stretching and strengthening, gait and stair training Next MD appt 12/17/18    PT Home Exercise Plan  standing march and hip abd, hamstring stretch, sit to stands, heel toe raises, LAQ    Consulted and Agree with Plan of Care  Patient       Patient will benefit from skilled therapeutic intervention in order to improve the following deficits and impairments:  Abnormal gait, Decreased activity tolerance, Decreased balance, Decreased mobility, Decreased range of motion, Decreased strength, Difficulty walking, Impaired flexibility, Pain  Visit Diagnosis: Pain in left hip  Difficulty in  walking, not elsewhere classified  Stiffness of left hip, not  elsewhere classified  Abnormal posture     Problem List Patient Active Problem List   Diagnosis Date Noted  . Primary localized osteoarthritis of left hip 11/18/2018  . Primary osteoarthritis of left hip 11/18/2018  . Left hip pain secondary to OA  08/13/2018  . Microcytic anemia 08/13/2018  . Urinary retention 11/08/2017  . Liver fibrosis 04/30/2017  . NSAID long-term use 01/09/2016  . Chronic back pain secondary to DJD of cervical, thoracic and lumbar spine and disc herniation 01/09/2016  . Osteoarthritis of both knees 11/02/2011  . FOOT PAIN, BILATERAL 09/20/2009  . DISORDER, DEPRESSIVE NEC 09/25/2006  . HERPES LABIALIS 04/04/2006  . Chronic hepatitis C without hepatic coma (HCC) 02/22/2006  . Essential hypertension 02/22/2006    Lulu Riding PT, DPT, LAT, ATC  12/11/18  2:17 PM      [facility] [address] [place], [place], [address] Phone: [phone]   Fax:  [phone]  Name: Greg Flowers MRN: [id number] Date of Birth: 08-14-1955

## 2018-12-16 ENCOUNTER — Ambulatory Visit: Payer: Federal, State, Local not specified - PPO | Attending: Orthopaedic Surgery

## 2018-12-16 ENCOUNTER — Other Ambulatory Visit: Payer: Self-pay

## 2018-12-16 DIAGNOSIS — M6281 Muscle weakness (generalized): Secondary | ICD-10-CM | POA: Diagnosis not present

## 2018-12-16 DIAGNOSIS — M25652 Stiffness of left hip, not elsewhere classified: Secondary | ICD-10-CM

## 2018-12-16 DIAGNOSIS — R262 Difficulty in walking, not elsewhere classified: Secondary | ICD-10-CM | POA: Diagnosis not present

## 2018-12-16 DIAGNOSIS — M25552 Pain in left hip: Secondary | ICD-10-CM | POA: Diagnosis not present

## 2018-12-16 NOTE — Therapy (Signed)
[facility] [address] [place], [place], [address] Phone: [phone]   Fax:  [phone]  Physical Therapy Treatment  Patient Details  Name: Greg Flowers MRN: [id number] Date of Birth: 1955-06-11 Referring Provider (PT): Marcene Corning   Encounter Date: 12/16/2018  PT End of Session - 12/16/18 1602    Visit Number  4    Number of Visits  16    Date for PT Re-Evaluation  01/15/19    Authorization Type  BCBS federal    PT Start Time  0335    PT Stop Time  0424    PT Time Calculation (min)  49 min       Past Medical History:  Diagnosis Date  . Anxiety   . Calculus, kidney 2000   Sees Dr. Isabel Caprice, urology.   . Cervical spondylosis 2004   sp surgery by Dr. Danielle Dess  . Depression   . GERD (gastroesophageal reflux disease)   . Headache(784.0)    Chronic, on vicodin 5 TID for this.   . Hepatitis C    Has occasional visits with Dr. Randa Evens GI, failed RX in 2000.  Liver Biopsy 9/06: Minimally active hepatitis consistent with hepatitis C, minimal necroinflamtory activity grade 1, no incrased fibrosis stage 0.   . Herpes labialis   . History of kidney stones   . Hypertension   . Pneumonia   . Renal stone   . Spondylolisthesis of lumbar region   . Thalassemia    HGB 9/09 14.4 wtih MCV 72.6  . Wears glasses     Past Surgical History:  Procedure Laterality Date  . ANTERIOR CERVICAL DECOMP/DISCECTOMY FUSION N/A 11/21/2017   Procedure: ANTERIOR CERVICAL DECOMPRESSION FUSION, CERVICAL THREE-FOUR, CERVICAL FOUR-FIVE WITH INSTRUMENTATION AND ALLOGRAFT;  Surgeon: Estill Bamberg, MD;  Location: [facility];  Service: Orthopedics;  Laterality: N/A;  ANTERIOR CERVICAL DECOMPRESSION FUSION, CERVICAL THREE-FOUR, CERVICAL FOUR-FIVE WITH INSTRUMENTATION AND ALLOGRAFT  . BACK SURGERY  2017   L4-L5 PLATE/SCREWS  . CERVICAL DISCECTOMY  2004   Dr Danielle Dess  . COLONOSCOPY    . LITHOTRIPSY  2004   Dr Logan Bores  . NASAL SEPTUM SURGERY  2007  .  TONSILLECTOMY    . TOTAL HIP ARTHROPLASTY Left 11/18/2018   Procedure: Left Anterior Hip Arthroplasty;  Surgeon: Marcene Corning, MD;  [facility];  Service: Orthopedics;  Laterality: Left;    There were no vitals filed for this visit.  Subjective Assessment - 12/16/18 1535    Subjective  Its coming along.    Pain Score  4     Pain Location  Hip    Pain Orientation  Left    Pain Descriptors / Indicators  Aching;Sore    Pain Type  Chronic pain;Surgical pain    Pain Onset  More than a month ago    Pain Frequency  Constant                       [facility] Adult PT Treatment/Exercise - 12/16/18 0001      Ambulation/Gait   Gait Pattern  Step-through pattern    Ambulation Surface  Level;Indoor    Stairs  Yes    Stairs Assistance  4: Min guard    Stair Management Technique  Two rails;Alternating pattern    Number of Stairs  15    Gait Comments  Walking without device  and owrked on gait pattern on steps and        Knee/Hip Exercises: Aerobic  Nustep  L4 x 6 min LE only      Knee/Hip Exercises: Standing   Heel Raises  20 reps    Hip Flexion Limitations  x 30 each leg in bares.   side stepx  15 RT and Lt in bars, step backs x 15 RT and Lt       Knee/Hip Exercises: Seated   Long Arc Quad  20 reps    Long Arc Quad Weight  5 lbs.    Long Arc Quad Limitations  5 sec hold      Knee/Hip Exercises: Supine   Bridges  20 reps    Bridges Limitations  with green clam     Other Supine Knee/Hip Exercises  march green band at [employer]nees x 20 RT/LT    Other Supine Knee/Hip Exercises  clam green band  x 20      Cryotherapy   Number Minutes Cryotherapy  10 Minutes    Cryotherapy Location  Hip    Type of Cryotherapy  Ice pack                  PT Long Term Goals - 12/04/18 1539      PT LONG TERM GOAL #1   Title  Pt will be I and compliant with HEP. (Target goal for all goals 6 weeks 01/15/19)    Status  New      PT LONG TERM GOAL #2   Title  Pt will improve ROM  [facility] to improve mobility    Baseline  50% ROM available post op    Status  New      PT LONG TERM GOAL #3   Title  He will report pain decreased 50% or more  in LT hip with normal activity including yardwork    Baseline  5/10    Status  New      PT LONG TERM GOAL #4   Title  Pt will improve Lt hip strength to at least 5-/5 MMT to improve function    Status  New      PT LONG TERM GOAL #5   Title  He will improve 5TSTS test to 12 seconds or less    Status  New              Patient will benefit from skilled therapeutic intervention in order to improve the following deficits and impairments:     Visit Diagnosis: Pain in left hip  Difficulty in walking, not elsewhere classified  Stiffness of left hip, not elsewhere classified  Muscle weakness (generalized)     Problem List Patient Active Problem List   Diagnosis Date Noted  . Primary localized osteoarthritis of left hip 11/18/2018  . Primary osteoarthritis of left hip 11/18/2018  . Left hip pain secondary to OA  08/13/2018  . Microcytic anemia 08/13/2018  . Urinary retention 11/08/2017  . Liver fibrosis 04/30/2017  . NSAID long-term use 01/09/2016  . Chronic back pain secondary to DJD of cervical, thoracic and lumbar spine and disc herniation 01/09/2016  . Osteoarthritis of both knees 11/02/2011  . FOOT PAIN, BILATERAL 09/20/2009  . DISORDER, DEPRESSIVE NEC 09/25/2006  . HERPES LABIALIS 04/04/2006  . Chronic hepatitis C without hepatic coma (HCC) 02/22/2006  . Essential hypertension 02/22/2006    Caprice RedChasse, Jermya Dowding M PT 12/16/2018, 5:13 PM  [facility] [address] [place], [place], [address] Phone: [phone]   Fax:  [phone]  Name: Greg Flowers MRN: [id number] Date  of Birth: 20-Feb-1956

## 2018-12-18 ENCOUNTER — Ambulatory Visit: Payer: Federal, State, Local not specified - PPO | Admitting: Physical Therapy

## 2018-12-18 ENCOUNTER — Other Ambulatory Visit: Payer: Self-pay

## 2018-12-18 DIAGNOSIS — M25652 Stiffness of left hip, not elsewhere classified: Secondary | ICD-10-CM

## 2018-12-18 DIAGNOSIS — M6281 Muscle weakness (generalized): Secondary | ICD-10-CM

## 2018-12-18 DIAGNOSIS — R262 Difficulty in walking, not elsewhere classified: Secondary | ICD-10-CM

## 2018-12-18 DIAGNOSIS — M25552 Pain in left hip: Secondary | ICD-10-CM

## 2018-12-18 NOTE — Therapy (Signed)
[place], [place], [address] Phone: [phone]   Fax:  [phone]  Physical Therapy Treatment  Patient Details  Name: Greg Flowers MRN: [id number] Date of Birth: [birth year] Referring Provider (PT): Melrose Nakayama   Encounter Date: 12/18/2018  PT End of Session - 12/18/18 1606    Visit Number  5    Number of Visits  16    Date for PT Re-Evaluation  01/15/19    Authorization Type  BCBS federal    PT Start Time  0335    PT Stop Time  0420   last 7 min on ice   PT Time Calculation (min)  45 min    Activity Tolerance  Patient tolerated treatment well    Behavior During Therapy  [facility] for tasks assessed/performed       Past Medical History:  Diagnosis Date  . Anxiety   . Calculus, kidney 2000   Sees Dr. Risa Grill, urology.   . Cervical spondylosis 2004   sp surgery by Dr. Ellene Route  . Depression   . GERD (gastroesophageal reflux disease)   . Headache(784.0)    Chronic, on vicodin 5 TID for this.   . Hepatitis C    Has occasional visits with Dr. Oletta Lamas GI, failed RX in 2000.  Liver Biopsy 9/06: Minimally active hepatitis consistent with hepatitis C, minimal necroinflamtory activity grade 1, no incrased fibrosis stage 0.   . Herpes labialis   . History of kidney stones   . Hypertension   . Pneumonia   . Renal stone   . Spondylolisthesis of lumbar region   . Thalassemia    HGB 9/09 14.4 wtih MCV 72.6  . Wears glasses     Past Surgical History:  Procedure Laterality Date  . ANTERIOR CERVICAL DECOMP/DISCECTOMY FUSION N/A 11/21/2017   Procedure: ANTERIOR CERVICAL DECOMPRESSION FUSION, CERVICAL THREE-FOUR, CERVICAL FOUR-FIVE WITH INSTRUMENTATION AND ALLOGRAFT;  Surgeon: Phylliss Bob, MD;  Location: [place];  Service: Orthopedics;  Laterality: N/A;  ANTERIOR CERVICAL DECOMPRESSION FUSION, CERVICAL THREE-FOUR, CERVICAL FOUR-FIVE WITH INSTRUMENTATION AND ALLOGRAFT  . BACK SURGERY  2017   L4-L5 PLATE/SCREWS   . CERVICAL DISCECTOMY  2004   Dr Ellene Route  . COLONOSCOPY    . LITHOTRIPSY  2004   Dr Amalia Hailey  . NASAL SEPTUM SURGERY  2007  . TONSILLECTOMY    . TOTAL HIP ARTHROPLASTY Left 11/18/2018   Procedure: Left Anterior Hip Arthroplasty;  Surgeon: Melrose Nakayama, MD;  Location[facility];  Service: Orthopedics;  Laterality: Left;    There were no vitals filed for this visit.  Subjective Assessment - 12/18/18 1541    Subjective  Doctor seemed pleased, I am now walking with just SPC and not RW. Pain is 3-4 in my hip.    Pertinent History  Lt THA 11/18/18, cervical ACDF 2019, back surgery 2017,    How long can you stand comfortably?  few minutes only    Pain Onset  More than a month ago                       [facility] Adult PT Treatment/Exercise - 12/18/18 0001      Ambulation/Gait   Ambulation/Gait Assistance  6: Modified independent (Device/Increase time)    Ambulation Distance (Feet)  150 Feet    Assistive device  Straight cane    Gait Pattern  Step-through pattern    Gait Comments  adjusted cane height for him and proper demo on technique and he  showed good return demonstration and stability      Knee/Hip Exercises: Aerobic   Nustep  L5 x 6 min LE/UE      Knee/Hip Exercises: Standing   Heel Raises Limitations  heel toe raises X 20 ea    Hip Abduction  Stengthening;Both;20 reps    Hip Extension  Stengthening;Both;Knee straight    Lateral Step Up  Left;10 reps;Hand Hold: 1;Step Height: 6"    Forward Step Up  Left;15 reps;Hand Hold: 2;Step Height: 6"    Other Standing Knee Exercises  In bars for balance training march walking, retro walking, and sidestepping with red band around ankles with no UE support      Knee/Hip Exercises: Supine   Bridges  20 reps    Straight Leg Raises Limitations  flexion 3X5                  PT Long Term Goals - 12/04/18 1539      PT LONG TERM GOAL #1   Title  Pt will be I and compliant with HEP. (Target goal for all goals 6 weeks  01/15/19)    Status  New      PT LONG TERM GOAL #2   Title  Pt will improve ROM to [facility] to improve mobility    Baseline  50% ROM available post op    Status  New      PT LONG TERM GOAL #3   Title  He will report pain decreased 50% or more  in LT hip with normal activity including yardwork    Baseline  5/10    Status  New      PT LONG TERM GOAL #4   Title  Pt will improve Lt hip strength to at least 5-/5 MMT to improve function    Status  New      PT LONG TERM GOAL #5   Title  He will improve 5TSTS test to 12 seconds or less    Status  New            Plan - 12/18/18 1607    Clinical Impression Statement  Pt has progressed from RW to [facility]. Gait training provided for [facility] and cane was lowered to more appropriate height. He showed improvments in leg strength and balance today and is making steady progress toward his goals.    PT Treatment/Interventions  Aquatic Therapy;Electrical Stimulation;Cryotherapy;Iontophoresis 4mg /ml Dexamethasone;Moist Heat;Ultrasound;Gait training;Stair training;Therapeutic activities;Therapeutic exercise;Balance training;Neuromuscular re-education;Passive range of motion;Dry needling;Joint Manipulations    PT Next Visit Plan  reivew HEP and progress as needed, needs Lt hip stretching and strengthening, gait and stair training Next MD appt 12/17/18    PT Home Exercise Plan  standing march and hip abd, hamstring stretch, sit to stands, heel toe raises, LAQ    Consulted and Agree with Plan of Care  Patient       Patient will benefit from skilled therapeutic intervention in order to improve the following deficits and impairments:  Abnormal gait, Decreased activity tolerance, Decreased balance, Decreased mobility, Decreased range of motion, Decreased strength, Difficulty walking, Impaired flexibility, Pain  Visit Diagnosis: Pain in left hip  Difficulty in walking, not elsewhere classified  Stiffness of left hip, not elsewhere classified  Muscle weakness  (generalized)     Problem List Patient Active Problem List   Diagnosis Date Noted  . Primary localized osteoarthritis of left hip 11/18/2018  . Primary osteoarthritis of left hip 11/18/2018  . Left hip pain secondary to OA  08/13/2018  .  Microcytic anemia 08/13/2018  . Urinary retention 11/08/2017  . Liver fibrosis 04/30/2017  . NSAID long-term use 01/09/2016  . Chronic back pain secondary to DJD of cervical, thoracic and lumbar spine and disc herniation 01/09/2016  . Osteoarthritis of both knees 11/02/2011  . FOOT PAIN, BILATERAL 09/20/2009  . DISORDER, DEPRESSIVE NEC 09/25/2006  . HERPES LABIALIS 04/04/2006  . Chronic hepatitis C without hepatic coma (HCC) 02/22/2006  . Essential hypertension 02/22/2006    Birdie RiddleBrian R Nelson,PT,DPT 12/18/2018, 4:17 PM  [facility] [address] [place], [place], [address] Phone: [phone]   Fax:  [phone]  Name: Lorriane Shireerry L Kalata MRN: [id number] Date of Birth: Aug 07, 1955

## 2018-12-23 ENCOUNTER — Ambulatory Visit: Payer: Federal, State, Local not specified - PPO | Admitting: Physical Therapy

## 2018-12-23 ENCOUNTER — Other Ambulatory Visit: Payer: Self-pay

## 2018-12-23 DIAGNOSIS — M25552 Pain in left hip: Secondary | ICD-10-CM

## 2018-12-23 DIAGNOSIS — R262 Difficulty in walking, not elsewhere classified: Secondary | ICD-10-CM | POA: Diagnosis not present

## 2018-12-23 DIAGNOSIS — M25652 Stiffness of left hip, not elsewhere classified: Secondary | ICD-10-CM

## 2018-12-23 DIAGNOSIS — M6281 Muscle weakness (generalized): Secondary | ICD-10-CM

## 2018-12-23 NOTE — Therapy (Signed)
[place], [place], [address] Phone: [phone]   Fax:  [phone]  Physical Therapy Treatment  Patient Details  Name: Greg Flowers MRN: [id number] Date of Birth: 12/02/1955 Referring Provider (PT): Melrose Nakayama   Encounter Date: 12/23/2018  PT End of Session - 12/23/18 1614    Visit Number  6    Number of Visits  16    Date for PT Re-Evaluation  01/15/19    Authorization Type  BCBS federal    PT Start Time  7124    PT Stop Time  1620   last 7 min on ice   PT Time Calculation (min)  45 min    Activity Tolerance  Patient tolerated treatment well    Behavior During Therapy  [facility] for tasks assessed/performed       Past Medical History:  Diagnosis Date  . Anxiety   . Calculus, kidney 2000   Sees Dr. Risa Grill, urology.   . Cervical spondylosis 2004   sp surgery by Dr. Ellene Route  . Depression   . GERD (gastroesophageal reflux disease)   . Headache(784.0)    Chronic, on vicodin 5 TID for this.   . Hepatitis C    Has occasional visits with Dr. Oletta Lamas GI, failed RX in 2000.  Liver Biopsy 9/06: Minimally active hepatitis consistent with hepatitis C, minimal necroinflamtory activity grade 1, no incrased fibrosis stage 0.   . Herpes labialis   . History of kidney stones   . Hypertension   . Pneumonia   . Renal stone   . Spondylolisthesis of lumbar region   . Thalassemia    HGB 9/09 14.4 wtih MCV 72.6  . Wears glasses     Past Surgical History:  Procedure Laterality Date  . ANTERIOR CERVICAL DECOMP/DISCECTOMY FUSION N/A 11/21/2017   Procedure: ANTERIOR CERVICAL DECOMPRESSION FUSION, CERVICAL THREE-FOUR, CERVICAL FOUR-FIVE WITH INSTRUMENTATION AND ALLOGRAFT;  Surgeon: Phylliss Bob, MD;  Location: [place];  Service: Orthopedics;  Laterality: N/A;  ANTERIOR CERVICAL DECOMPRESSION FUSION, CERVICAL THREE-FOUR, CERVICAL FOUR-FIVE WITH INSTRUMENTATION AND ALLOGRAFT  . BACK SURGERY  2017   L4-L5 PLATE/SCREWS   . CERVICAL DISCECTOMY  2004   Dr Ellene Route  . COLONOSCOPY    . LITHOTRIPSY  2004   Dr Amalia Hailey  . NASAL SEPTUM SURGERY  2007  . TONSILLECTOMY    . TOTAL HIP ARTHROPLASTY Left 11/18/2018   Procedure: Left Anterior Hip Arthroplasty;  Surgeon: Melrose Nakayama, MD;  Location: [facility];  Service: Orthopedics;  Laterality: Left;    There were no vitals filed for this visit.  Subjective Assessment - 12/23/18 1555    Subjective  I feel steady with my cane now and even walk in the house some without it if there is something close by for me to grab if I need to.    Pertinent History  Lt THA 11/18/18, cervical ACDF 2019, back surgery 2017,    How long can you stand comfortably?  few minutes only    Currently in Pain?  Yes    Pain Score  3     Pain Location  Hip    Pain Orientation  Left    Pain Descriptors / Indicators  Aching;Sore    Pain Type  Surgical pain    Pain Onset  More than a month ago                       [facility] Adult PT Treatment/Exercise - 12/23/18 0001  Ambulation/Gait   Gait Comments  11050ft X 2 mod I with SPC      Knee/Hip Exercises: Stretches   Hip Flexor Stretch  Left;2 reps;30 seconds    Hip Flexor Stretch Limitations  standing      Knee/Hip Exercises: Aerobic   Nustep  L6 X 5 min UE/LE      Knee/Hip Exercises: Standing   Heel Raises Limitations  heel toe raises X 20 ea    Lateral Step Up  Left;Hand Hold: 1;Step Height: 6";15 reps    Forward Step Up  Left;15 reps;Hand Hold: 2;Step Height: 6";Right    Other Standing Knee Exercises  In bars for balance training march walking, retro walking, and sidestepping with red band around ankles, also tandem walking all with intermitte UE support as needed in bars      Knee/Hip Exercises: Seated   Other Seated Knee/Hip Exercises  hip IR/ER red X 20    Sit to Sand  2 sets;10 reps;without UE support      Knee/Hip Exercises: Supine   Bridges  20 reps    Bridges Limitations  with green clam     Straight Leg Raises  Limitations  flexion 2X10      Cryotherapy   Number Minutes Cryotherapy  7 Minutes    Cryotherapy Location  Hip    Type of Cryotherapy  Ice pack                  PT Long Term Goals - 12/04/18 1539      PT LONG TERM GOAL #1   Title  Pt will be I and compliant with HEP. (Target goal for all goals 6 weeks 01/15/19)    Status  New      PT LONG TERM GOAL #2   Title  [facility]    Baseline  50% ROM available post op    Status  New      PT LONG TERM GOAL #3   Title  He will report pain decreased 50% or more  in LT hip with normal activity including yardwork    Baseline  5/10    Status  New      PT LONG TERM GOAL #4   Title  Pt will improve Lt hip strength to at least 5-/5 MMT to improve function    Status  New      PT LONG TERM GOAL #5   Title  He will improve 5TSTS test to 12 seconds or less    Status  New            Plan - 12/23/18 1615    Clinical Impression Statement  Again progressed Lt hip strength, stretching, and dynamic balance with good tolerance. Ice applied at end for soreness. PT will continue to progress as tolreated.    PT Treatment/Interventions  Aquatic Therapy;Electrical Stimulation;Cryotherapy;Iontophoresis 4mg /ml Dexamethasone;Moist Heat;Ultrasound;Gait training;Stair training;Therapeutic activities;Therapeutic exercise;Balance training;Neuromuscular re-education;Passive range of motion;Dry needling;Joint Manipulations    PT Next Visit Plan  reivew HEP and progress as needed, needs Lt hip stretching and strengthening, gait and stair training Next MD appt 12/17/18    PT Home Exercise Plan  standing march and hip abd, hamstring stretch, sit to stands, heel toe raises, LAQ    Consulted and Agree with Plan of Care  Patient       Patient will benefit from skilled therapeutic intervention in order to improve the following deficits and impairments:  Abnormal gait, Decreased activity  tolerance, Decreased balance,  Decreased mobility, Decreased range of motion, Decreased strength, Difficulty walking, Impaired flexibility, Pain  Visit Diagnosis: Pain in left hip  Difficulty in walking, not elsewhere classified  Stiffness of left hip, not elsewhere classified  Muscle weakness (generalized)     Problem List Patient Active Problem List   Diagnosis Date Noted  . Primary localized osteoarthritis of left hip 11/18/2018  . Primary osteoarthritis of left hip 11/18/2018  . Left hip pain secondary to OA  08/13/2018  . Microcytic anemia 08/13/2018  . Urinary retention 11/08/2017  . Liver fibrosis 04/30/2017  . NSAID long-term use 01/09/2016  . Chronic back pain secondary to DJD of cervical, thoracic and lumbar spine and disc herniation 01/09/2016  . Osteoarthritis of both knees 11/02/2011  . FOOT PAIN, BILATERAL 09/20/2009  . DISORDER, DEPRESSIVE NEC 09/25/2006  . HERPES LABIALIS 04/04/2006  . Chronic hepatitis C without hepatic coma (HCC) 02/22/2006  . Essential hypertension 02/22/2006    Birdie Riddle 12/23/2018, 4:16 PM  [facility] [address] [place], [place], [address] Phone: [phone]   Fax:  [phone]  Name: Greg Flowers MRN: [id number] Date of Birth: 04/26/1955

## 2018-12-25 ENCOUNTER — Other Ambulatory Visit: Payer: Self-pay

## 2018-12-25 ENCOUNTER — Ambulatory Visit: Payer: Federal, State, Local not specified - PPO

## 2018-12-25 DIAGNOSIS — R262 Difficulty in walking, not elsewhere classified: Secondary | ICD-10-CM

## 2018-12-25 DIAGNOSIS — M25652 Stiffness of left hip, not elsewhere classified: Secondary | ICD-10-CM | POA: Diagnosis not present

## 2018-12-25 DIAGNOSIS — M6281 Muscle weakness (generalized): Secondary | ICD-10-CM | POA: Diagnosis not present

## 2018-12-25 DIAGNOSIS — M25552 Pain in left hip: Secondary | ICD-10-CM | POA: Diagnosis not present

## 2018-12-25 NOTE — Therapy (Signed)
[facility] [address] [place], [place], [address] Phone: [phone]   Fax:  [phone]  Physical Therapy Treatment  Patient Details  Name: Greg Flowers MRN: [id number] Date of Birth: [birth year] Referring Provider (PT): Marcene Corningalldorf, Peter   Encounter Date: 12/25/2018  PT End of Session - 12/25/18 1538    Visit Number  7    Number of Visits  16    Date for PT Re-Evaluation  01/15/19    Authorization Type  BCBS federal    PT Start Time  0245    PT Stop Time  0335    PT Time Calculation (min)  50 min    Activity Tolerance  Patient tolerated treatment well    Behavior During Therapy  [facility] for tasks assessed/performed       Past Medical History:  Diagnosis Date  . Anxiety   . Calculus, kidney 2000   Sees Dr. Isabel CapriceGrapey, urology.   . Cervical spondylosis 2004   sp surgery by Dr. Danielle DessElsner  . Depression   . GERD (gastroesophageal reflux disease)   . Headache(784.0)    Chronic, on vicodin 5 TID for this.   . Hepatitis C    Has occasional visits with Dr. Randa EvensEdwards GI, failed RX in 2000.  Liver Biopsy 9/06: Minimally active hepatitis consistent with hepatitis C, minimal necroinflamtory activity grade 1, no incrased fibrosis stage 0.   . Herpes labialis   . History of kidney stones   . Hypertension   . Pneumonia   . Renal stone   . Spondylolisthesis of lumbar region   . Thalassemia    HGB 9/09 14.4 wtih MCV 72.6  . Wears glasses     Past Surgical History:  Procedure Laterality Date  . ANTERIOR CERVICAL DECOMP/DISCECTOMY FUSION N/A 11/21/2017   Procedure: ANTERIOR CERVICAL DECOMPRESSION FUSION, CERVICAL THREE-FOUR, CERVICAL FOUR-FIVE WITH INSTRUMENTATION AND ALLOGRAFT;  Surgeon: Estill Bambergumonski, Mark, MD;  [facility];  Service: Orthopedics;  Laterality: N/A;  ANTERIOR CERVICAL DECOMPRESSION FUSION, CERVICAL THREE-FOUR, CERVICAL FOUR-FIVE WITH INSTRUMENTATION AND ALLOGRAFT  . BACK SURGERY  2017   L4-L5 PLATE/SCREWS  . CERVICAL  DISCECTOMY  2004   Dr Danielle DessElsner  . COLONOSCOPY    . LITHOTRIPSY  2004   Dr Logan BoresEvans  . NASAL SEPTUM SURGERY  2007  . TONSILLECTOMY    . TOTAL HIP ARTHROPLASTY Left 11/18/2018   Procedure: Left Anterior Hip Arthroplasty;  Surgeon: Marcene Corningalldorf, Peter, MD;  [facility];  Service: Orthopedics;  Laterality: Left;    There were no vitals filed for this visit.  Subjective Assessment - 12/25/18 1523    Subjective  5/10 LT hip pain    Pain Score  5     Pain Location  Hip    Pain Orientation  Left    Pain Descriptors / Indicators  Aching    Pain Type  Surgical pain    Pain Onset  More than a month ago    Pain Frequency  Constant    Aggravating Factors   weight bearing ,    Pain Relieving Factors  meds                       [facility] Adult PT Treatment/Exercise - 12/25/18 0001      Ambulation/Gait   Gait Comments  [facility]       Neuro Re-ed    Neuro Re-ed Details   stepping to heel and back and side step without shifting weight full on  moving leg x 15 reps  each.       Knee/Hip Exercises: Aerobic   Nustep  L6 X 10 min UE/LE      Knee/Hip Exercises: Standing   Heel Raises Limitations  heel toe raises X 20 ea    Hip Flexion  Right;Left;15 reps    Hip Flexion Limitations  3#    Hip Abduction  Right;Left;15 reps    Abduction Limitations  3 #    Hip Extension  Right;Left;15 reps    Extension Limitations  3#    Lateral Step Up  Left;Hand Hold: 1;Step Height: 8";20 reps;Right;15 reps    Other Standing Knee Exercises  In bars for balance training march walking, retro walking, and sidestepping with red band around ankles, also tandem walking all with intermitte UE support as needed in bars      Knee/Hip Exercises: Seated   Other Seated Knee/Hip Exercises  hip IR/ER green X 20    Sit to Sand  20 reps;without UE support             PT Education - 12/25/18 1534    Education Details  He  asked about riding a regular bicycle at home for exercise and i suggested  this was not a good idea as he has had more than one surgery and  a fall may damage these repairs  or other body pasts . Suggested a stationary bike would be more appropriate.    Person(s) Educated  Patient    Methods  Explanation    Comprehension  Verbalized understanding          PT Long Term Goals - 12/04/18 1539      PT LONG TERM GOAL #1   Title  Pt will be I and compliant with HEP. (Target goal for all goals 6 weeks 01/15/19)    Status  New      PT LONG TERM GOAL #2   Title  Pt will improve ROM to [facility] to improve mobility    Baseline  50% ROM available post op    Status  New      PT LONG TERM GOAL #3   Title  He will report pain decreased 50% or more  in LT hip with normal activity including yardwork    Baseline  5/10    Status  New      PT LONG TERM GOAL #4   Title  Pt will improve Lt hip strength to at least 5-/5 MMT to improve function    Status  New      PT LONG TERM GOAL #5   Title  He will improve 5TSTS test to 12 seconds or less    Status  New            Plan - 12/25/18 1539    Clinical Impression Statement  He appears improved with blance and strength. He still feels more comfortable incommunity with SPC.    His pain level is moderate but does not appear to cause any limits withhis exercise here.    PT Treatment/Interventions  Aquatic Therapy;Electrical Stimulation;Cryotherapy;Iontophoresis 4mg /ml Dexamethasone;Moist Heat;Ultrasound;Gait training;Stair training;Therapeutic activities;Therapeutic exercise;Balance training;Neuromuscular re-education;Passive range of motion;Dry needling;Joint Manipulations    PT Next Visit Plan  progress HEP as needed with bands,  Lt hip stretching and strengthening, gait and stair training    PT Home Exercise Plan  standing march and hip abd, hamstring stretch, sit to stands, heel toe raises, LAQ    Consulted and Agree with Plan of  Care  Patient       Patient will benefit from skilled therapeutic intervention in order to  improve the following deficits and impairments:  Abnormal gait, Decreased activity tolerance, Decreased balance, Decreased mobility, Decreased range of motion, Decreased strength, Difficulty walking, Impaired flexibility, Pain  Visit Diagnosis: Pain in left hip  Difficulty in walking, not elsewhere classified  Stiffness of left hip, not elsewhere classified  Muscle weakness (generalized)     Problem List Patient Active Problem List   Diagnosis Date Noted  . Primary localized osteoarthritis of left hip 11/18/2018  . Primary osteoarthritis of left hip 11/18/2018  . Left hip pain secondary to OA  08/13/2018  . Microcytic anemia 08/13/2018  . Urinary retention 11/08/2017  . Liver fibrosis 04/30/2017  . NSAID long-term use 01/09/2016  . Chronic back pain secondary to DJD of cervical, thoracic and lumbar spine and disc herniation 01/09/2016  . Osteoarthritis of both knees 11/02/2011  . FOOT PAIN, BILATERAL 09/20/2009  . DISORDER, DEPRESSIVE NEC 09/25/2006  . HERPES LABIALIS 04/04/2006  . Chronic hepatitis C without hepatic coma ([place]) 02/22/2006  . Essential hypertension 02/22/2006    [name]  PT 12/25/2018, 3:44 PM  [facility] [address] [place], [place], [address] Phone: [phone]   Fax:  [phone]  Name: LANIS STORLIE MRN: [id number] Date of Birth: 31-Aug-1955

## 2018-12-30 ENCOUNTER — Ambulatory Visit: Payer: Federal, State, Local not specified - PPO

## 2018-12-30 ENCOUNTER — Other Ambulatory Visit: Payer: Self-pay

## 2018-12-30 DIAGNOSIS — M6281 Muscle weakness (generalized): Secondary | ICD-10-CM | POA: Diagnosis not present

## 2018-12-30 DIAGNOSIS — M25552 Pain in left hip: Secondary | ICD-10-CM | POA: Diagnosis not present

## 2018-12-30 DIAGNOSIS — R262 Difficulty in walking, not elsewhere classified: Secondary | ICD-10-CM

## 2018-12-30 DIAGNOSIS — M25652 Stiffness of left hip, not elsewhere classified: Secondary | ICD-10-CM | POA: Diagnosis not present

## 2018-12-30 NOTE — Therapy (Signed)
[place], [place], [address] Phone: [phone]   Fax:  [phone]  Physical Therapy Treatment  Patient Details  Name: Greg Flowers MRN: [id number] Date of Birth: 1956/03/28 Referring Provider (PT): Melrose Nakayama   Encounter Date: 12/30/2018  PT End of Session - 12/30/18 1536    Visit Number  8    Number of Visits  16    Date for PT Re-Evaluation  01/15/19    Authorization Type  BCBS federal    PT Start Time  [id number]    PT Stop Time  0417    PT Time Calculation (min)  41 min    Activity Tolerance  Patient tolerated treatment well    Behavior During Therapy  [facility] for tasks assessed/performed       Past Medical History:  Diagnosis Date  . Anxiety   . Calculus, kidney 2000   Sees Dr. Risa Grill, urology.   . Cervical spondylosis 2004   sp surgery by Dr. Ellene Route  . Depression   . GERD (gastroesophageal reflux disease)   . Headache(784.0)    Chronic, on vicodin 5 TID for this.   . Hepatitis C    Has occasional visits with Dr. Oletta Lamas GI, failed RX in 2000.  Liver Biopsy 9/06: Minimally active hepatitis consistent with hepatitis C, minimal necroinflamtory activity grade 1, no incrased fibrosis stage 0.   . Herpes labialis   . History of kidney stones   . Hypertension   . Pneumonia   . Renal stone   . Spondylolisthesis of lumbar region   . Thalassemia    HGB 9/09 14.4 wtih MCV 72.6  . Wears glasses     Past Surgical History:  Procedure Laterality Date  . ANTERIOR CERVICAL DECOMP/DISCECTOMY FUSION N/A 11/21/2017   Procedure: ANTERIOR CERVICAL DECOMPRESSION FUSION, CERVICAL THREE-FOUR, CERVICAL FOUR-FIVE WITH INSTRUMENTATION AND ALLOGRAFT;  Surgeon: Phylliss Bob, MD;  Location: [place];  Service: Orthopedics;  Laterality: N/A;  ANTERIOR CERVICAL DECOMPRESSION FUSION, CERVICAL THREE-FOUR, CERVICAL FOUR-FIVE WITH INSTRUMENTATION AND ALLOGRAFT  . BACK SURGERY  2017   L4-L5 PLATE/SCREWS  . CERVICAL  DISCECTOMY  2004   Dr Ellene Route  . COLONOSCOPY    . LITHOTRIPSY  2004   Dr Amalia Hailey  . NASAL SEPTUM SURGERY  2007  . TONSILLECTOMY    . TOTAL HIP ARTHROPLASTY Left 11/18/2018   Procedure: Left Anterior Hip Arthroplasty;  Surgeon: Melrose Nakayama, MD;  [facility];  Service: Orthopedics;  Laterality: Left;    There were no vitals filed for this visit.  Subjective Assessment - 12/30/18 1541    Subjective  Pain 2/10 so improved   Cleaned steps in back up /down x 2 with rails toal step 18 steps.    Pain Score  2     Pain Location  Hip    Pain Orientation  Left    Pain Descriptors / Indicators  Aching    Pain Type  Surgical pain    Pain Onset  More than a month ago    Pain Frequency  Constant    Aggravating Factors   walking/stand    Pain Relieving Factors  meds                       [facility] Adult PT Treatment/Exercise - 12/30/18 0001      Knee/Hip Exercises: Aerobic   Nustep  L6 8 min LLE only      Knee/Hip Exercises: Standing   Heel Raises Limitations  heel toe raises X 20 ea    Hip Flexion  Right;Left;20 reps    Hip Flexion Limitations  4#    Hip Abduction  Right;Left;20 reps    Abduction Limitations  4#    Hip Extension  Right;Left;20 reps    Extension Limitations  4#    Lateral Step Up  Left;Hand Hold: 1;Step Height: 8";20 reps;Right;15 reps    Forward Step Up  Right;Left;15 reps;Hand Hold: 2;Step Height: 8"    Other Standing Knee Exercises  retro walk with emphasis on weight trasfer to back leg.   , marching with 3 sec pause for balance.    stairs x 20 steps one rail step over step safely      Knee/Hip Exercises: Seated   Sit to Sand  without UE support;10 reps   then 15 with 8# weight to chest.      Knee/Hip Exercises: Supine   Bridges  20 reps    Other Supine Knee/Hip Exercises  green clams x 30 and hip IR x 25                  PT Long Term Goals - 12/30/18 1641      PT LONG TERM GOAL #1   Title  Pt will be I and compliant with HEP.  (Target goal for all goals 6 weeks 01/15/19)    Status  On-going      PT LONG TERM GOAL #2   Title  Pt will improve ROM to [facility] to improve mobility    Status  On-going      PT LONG TERM GOAL #3   Title  He will report pain decreased 50% or more  in LT hip with normal activity including yardwork    Baseline  2-3 but not doing alot of yard work    Status  Partially Met      PT LONG TERM GOAL #4   Title  Pt will improve Lt hip strength to at least 5-/5 MMT to improve function    Status  On-going      PT LONG TERM GOAL #5   Title  He will improve 5TSTS test to 12 seconds or less    Status  On-going            Plan - 12/30/18 1543    Clinical Impression Statement  Stairs step over step safely, single leg balance better as he is able to hold 3-4 seconds at times with high knee march.  suggested to him to walk inhome withut device only if he feels safe but he may be able to get off SPC very soon.    PT Treatment/Interventions  Aquatic Therapy;Electrical Stimulation;Cryotherapy;Iontophoresis 15m/ml Dexamethasone;Moist Heat;Ultrasound;Gait training;Stair training;Therapeutic activities;Therapeutic exercise;Balance training;Neuromuscular re-education;Passive range of motion;Dry needling;Joint Manipulations    PT Next Visit Plan  progress HEP as needed with bands,  Lt hip stretching and strengthening, gait    Sit to stand x 5 test next  visit    PT Home Exercise Plan  standing march and hip abd, hamstring stretch, sit to stands, heel toe raises, LAQ    Consulted and Agree with Plan of Care  Patient       Patient will benefit from skilled therapeutic intervention in order to improve the following deficits and impairments:  Abnormal gait, Decreased activity tolerance, Decreased balance, Decreased mobility, Decreased range of motion, Decreased strength, Difficulty walking, Impaired flexibility, Pain  Visit Diagnosis: Pain in left hip  Difficulty in walking, not elsewhere  classified  Stiffness of left hip, not elsewhere classified  Muscle weakness (generalized)     Problem List Patient Active Problem List   Diagnosis Date Noted  . Primary localized osteoarthritis of left hip 11/18/2018  . Primary osteoarthritis of left hip 11/18/2018  . Left hip pain secondary to OA  08/13/2018  . Microcytic anemia 08/13/2018  . Urinary retention 11/08/2017  . Liver fibrosis 04/30/2017  . NSAID long-term use 01/09/2016  . Chronic back pain secondary to DJD of cervical, thoracic and lumbar spine and disc herniation 01/09/2016  . Osteoarthritis of both knees 11/02/2011  . FOOT PAIN, BILATERAL 09/20/2009  . DISORDER, DEPRESSIVE NEC 09/25/2006  . HERPES LABIALIS 04/04/2006  . Chronic hepatitis C without hepatic coma ([place]) 02/22/2006  . Essential hypertension 02/22/2006    Greg Flowers   PT  12/30/2018, 4:43 PM  [place] [facility] [address] [place], [place], [address] Phone: [phone]   Fax:  [phone]  Name: Greg Flowers MRN: [id number] Date of Birth: 09/17/1955

## 2018-12-31 ENCOUNTER — Other Ambulatory Visit: Payer: Self-pay | Admitting: Internal Medicine

## 2018-12-31 DIAGNOSIS — I10 Essential (primary) hypertension: Secondary | ICD-10-CM

## 2019-01-01 ENCOUNTER — Other Ambulatory Visit: Payer: Self-pay

## 2019-01-01 ENCOUNTER — Ambulatory Visit: Payer: Federal, State, Local not specified - PPO

## 2019-01-01 DIAGNOSIS — M6281 Muscle weakness (generalized): Secondary | ICD-10-CM | POA: Diagnosis not present

## 2019-01-01 DIAGNOSIS — M25552 Pain in left hip: Secondary | ICD-10-CM | POA: Diagnosis not present

## 2019-01-01 DIAGNOSIS — R262 Difficulty in walking, not elsewhere classified: Secondary | ICD-10-CM | POA: Diagnosis not present

## 2019-01-01 DIAGNOSIS — M25652 Stiffness of left hip, not elsewhere classified: Secondary | ICD-10-CM

## 2019-01-01 NOTE — Patient Instructions (Signed)
Sitting hip flexion RT and LT   30 sec x 2-3  2x/day

## 2019-01-01 NOTE — Therapy (Signed)
[place], [place], [address] Phone: [phone]   Fax:  [phone]  Physical Therapy Treatment  Patient Details  Name: Greg Flowers MRN: [id number] Date of Birth: 03/19/1956 Referring Provider (PT): Melrose Nakayama   Encounter Date: 01/01/2019  PT End of Session - 01/01/19 1619    Visit Number  9    Number of Visits  16    Date for PT Re-Evaluation  01/15/19    Authorization Type  BCBS federal    PT Start Time  0335    PT Stop Time  0415    PT Time Calculation (min)  40 min    Activity Tolerance  Patient tolerated treatment well    Behavior During Therapy  [facility] for tasks assessed/performed       Past Medical History:  Diagnosis Date  . Anxiety   . Calculus, kidney 2000   Sees Dr. Risa Grill, urology.   . Cervical spondylosis 2004   sp surgery by Dr. Ellene Route  . Depression   . GERD (gastroesophageal reflux disease)   . Headache(784.0)    Chronic, on vicodin 5 TID for this.   . Hepatitis C    Has occasional visits with Dr. Oletta Lamas GI, failed RX in 2000.  Liver Biopsy 9/06: Minimally active hepatitis consistent with hepatitis C, minimal necroinflamtory activity grade 1, no incrased fibrosis stage 0.   . Herpes labialis   . History of kidney stones   . Hypertension   . Pneumonia   . Renal stone   . Spondylolisthesis of lumbar region   . Thalassemia    HGB 9/09 14.4 wtih MCV 72.6  . Wears glasses     Past Surgical History:  Procedure Laterality Date  . ANTERIOR CERVICAL DECOMP/DISCECTOMY FUSION N/A 11/21/2017   Procedure: ANTERIOR CERVICAL DECOMPRESSION FUSION, CERVICAL THREE-FOUR, CERVICAL FOUR-FIVE WITH INSTRUMENTATION AND ALLOGRAFT;  Surgeon: Phylliss Bob, MD;  Location: [place];  Service: Orthopedics;  Laterality: N/A;  ANTERIOR CERVICAL DECOMPRESSION FUSION, CERVICAL THREE-FOUR, CERVICAL FOUR-FIVE WITH INSTRUMENTATION AND ALLOGRAFT  . BACK SURGERY  2017   L4-L5 PLATE/SCREWS  . CERVICAL  DISCECTOMY  2004   Dr Ellene Route  . COLONOSCOPY    . LITHOTRIPSY  2004   Dr Amalia Hailey  . NASAL SEPTUM SURGERY  2007  . TONSILLECTOMY    . TOTAL HIP ARTHROPLASTY Left 11/18/2018   Procedure: Left Anterior Hip Arthroplasty;  Surgeon: Melrose Nakayama, MD;  Location: [facility];  Service: Orthopedics;  Laterality: Left;    There were no vitals filed for this visit.  Subjective Assessment - 01/01/19 1703    Subjective  2/10 pain.   Planted some vegitables for fall.   Going to beach this weekend to fish on peir. Suggested to him to be careful and use cane.                       [facility] Adult PT Treatment/Exercise - 01/01/19 0001      Knee/Hip Exercises: Stretches   Other Knee/Hip Stretches  hip flexion in sitting and standing  with supervision.      Knee/Hip Exercises: Aerobic   Nustep  L6 12 min LLE only      Knee/Hip Exercises: Standing   Heel Raises Limitations  heel toe raises X 30 ea    Hip Flexion  Right;Left;20 reps    Hip Flexion Limitations  5#    Hip Abduction  Right;Left    Abduction Limitations  5#    Lateral  Step Up  Left;Hand Hold: 1;Step Height: 8";20 reps;Right;15 reps    Forward Step Up  Right;Left;15 reps;Hand Hold: 2;Step Height: 8"    Functional Squat  20 reps    Functional Squat Limitations  hold to bar  and supervised    Other Standing Knee Exercises  free motion 23 # forard , back and to RT  x 8 each supervised close.       Marching with high knees with slight pause to work on balance single leg.       PT Education - 01/01/19 1702    Education Details  HEP    Person(s) Educated  Patient    Methods  Explanation;Demonstration;Tactile cues;Verbal cues    Comprehension  Returned demonstration;Verbalized understanding          PT Long Term Goals - 12/30/18 1641      PT LONG TERM GOAL #1   Title  Pt will be I and compliant with HEP. (Target goal for all goals 6 weeks 01/15/19)    Status  On-going      PT LONG TERM GOAL #2   Title  Pt will  improve ROM to [facility] to improve mobility    Status  On-going      PT LONG TERM GOAL #3   Title  He will report pain decreased 50% or more  in LT hip with normal activity including yardwork    Baseline  2-3 but not doing alot of yard work    Status  Partially Met      PT LONG TERM GOAL #4   Title  Pt will improve Lt hip strength to at least 5-/5 MMT to improve function    Status  On-going      PT LONG TERM GOAL #5   Title  He will improve 5TSTS test to 12 seconds or less    Status  On-going            Plan - 01/01/19 1620    Clinical Impression Statement  He is doing well . Stiffness of RT hip noted and issued stretching for home.    Balance  decreased bilaterally .   Still using SPC for comfort but appears unless surface is wet on unlevel he does not need the cane for ambulation.    PT Treatment/Interventions  Aquatic Therapy;Electrical Stimulation;Cryotherapy;Iontophoresis '4mg'$ /ml Dexamethasone;Moist Heat;Ultrasound;Gait training;Stair training;Therapeutic activities;Therapeutic exercise;Balance training;Neuromuscular re-education;Passive range of motion;Dry needling;Joint Manipulations    PT Next Visit Plan  progress HEP as needed with bands,  Lt/RT hip stretching and strengthening, gait    Sit to stand x 5 test next  visit    PT Home Exercise Plan  standing march and hip abd, hamstring stretch, sit to stands, heel toe raises, LAQ    Consulted and Agree with Plan of Care  Patient       Patient will benefit from skilled therapeutic intervention in order to improve the following deficits and impairments:  Abnormal gait, Decreased activity tolerance, Decreased balance, Decreased mobility, Decreased range of motion, Decreased strength, Difficulty walking, Impaired flexibility, Pain  Visit Diagnosis: Pain in left hip  Difficulty in walking, not elsewhere classified  Stiffness of left hip, not elsewhere classified  Muscle weakness (generalized)     Problem List Patient Active  Problem List   Diagnosis Date Noted  . Primary localized osteoarthritis of left hip 11/18/2018  . Primary osteoarthritis of left hip 11/18/2018  . Left hip pain secondary to OA  08/13/2018  . Microcytic anemia 08/13/2018  .  Urinary retention 11/08/2017  . Liver fibrosis 04/30/2017  . NSAID long-term use 01/09/2016  . Chronic back pain secondary to DJD of cervical, thoracic and lumbar spine and disc herniation 01/09/2016  . Osteoarthritis of both knees 11/02/2011  . FOOT PAIN, BILATERAL 09/20/2009  . DISORDER, DEPRESSIVE NEC 09/25/2006  . HERPES LABIALIS 04/04/2006  . Chronic hepatitis C without hepatic coma ([place]) 02/22/2006  . Essential hypertension 02/22/2006    [name]  PT 01/01/2019, 5:05 PM  [place] [facility] [address] [place], [place], [address] Phone: [phone]   Fax:  [phone]  Name: HOSEY BURMESTER MRN: [id number] Date of Birth: November 05, 1955

## 2019-01-06 ENCOUNTER — Ambulatory Visit: Payer: Federal, State, Local not specified - PPO

## 2019-01-06 ENCOUNTER — Other Ambulatory Visit: Payer: Self-pay

## 2019-01-06 DIAGNOSIS — M25552 Pain in left hip: Secondary | ICD-10-CM | POA: Diagnosis not present

## 2019-01-06 DIAGNOSIS — R262 Difficulty in walking, not elsewhere classified: Secondary | ICD-10-CM | POA: Diagnosis not present

## 2019-01-06 DIAGNOSIS — M25652 Stiffness of left hip, not elsewhere classified: Secondary | ICD-10-CM

## 2019-01-06 DIAGNOSIS — M6281 Muscle weakness (generalized): Secondary | ICD-10-CM

## 2019-01-06 NOTE — Therapy (Signed)
[place], [place], [address] Phone: [phone]   Fax:  [phone]  Physical Therapy Treatment  Patient Details  Name: Greg Flowers MRN: [id number] Date of Birth: 1955-11-01 Referring Provider (PT): Melrose Nakayama  Progress Note Reporting Period 12/04/18 to 01/06/19  See note below for Objective Data and Assessment of Progress/Goals.      Encounter Date: 01/06/2019  PT End of Session - 01/06/19 1536    Visit Number  10    Number of Visits  16    Date for PT Re-Evaluation  01/15/19    PT Start Time  0330    PT Stop Time  0425    PT Time Calculation (min)  55 min    Activity Tolerance  Patient tolerated treatment well    Behavior During Therapy  [facility] for tasks assessed/performed       Past Medical History:  Diagnosis Date  . Anxiety   . Calculus, kidney 2000   Sees Dr. Risa Grill, urology.   . Cervical spondylosis 2004   sp surgery by Dr. Ellene Route  . Depression   . GERD (gastroesophageal reflux disease)   . Headache(784.0)    Chronic, on vicodin 5 TID for this.   . Hepatitis C    Has occasional visits with Dr. Oletta Lamas GI, failed RX in 2000.  Liver Biopsy 9/06: Minimally active hepatitis consistent with hepatitis C, minimal necroinflamtory activity grade 1, no incrased fibrosis stage 0.   . Herpes labialis   . History of kidney stones   . Hypertension   . Pneumonia   . Renal stone   . Spondylolisthesis of lumbar region   . Thalassemia    HGB 9/09 14.4 wtih MCV 72.6  . Wears glasses     Past Surgical History:  Procedure Laterality Date  . ANTERIOR CERVICAL DECOMP/DISCECTOMY FUSION N/A 11/21/2017   Procedure: ANTERIOR CERVICAL DECOMPRESSION FUSION, CERVICAL THREE-FOUR, CERVICAL FOUR-FIVE WITH INSTRUMENTATION AND ALLOGRAFT;  Surgeon: Phylliss Bob, MD;  Location: [facility];  Service: Orthopedics;  Laterality: N/A;  ANTERIOR CERVICAL DECOMPRESSION FUSION, CERVICAL THREE-FOUR, CERVICAL FOUR-FIVE WITH  INSTRUMENTATION AND ALLOGRAFT  . BACK SURGERY  2017   L4-L5 PLATE/SCREWS  . CERVICAL DISCECTOMY  2004   Dr Ellene Route  . COLONOSCOPY    . LITHOTRIPSY  2004   Dr Amalia Hailey  . NASAL SEPTUM SURGERY  2007  . TONSILLECTOMY    . TOTAL HIP ARTHROPLASTY Left 11/18/2018   Procedure: Left Anterior Hip Arthroplasty;  Surgeon: Melrose Nakayama, MD;  Location: [facility];  Service: Orthopedics;  Laterality: Left;    There were no vitals filed for this visit.  Subjective Assessment - 01/06/19 1535    Subjective  2/10 LT hip.    Pertinent History  Lt THA 11/18/18, cervical ACDF 2019, back surgery 2017,    Pain Score  2     Pain Location  Hip    Pain Orientation  Left    Pain Descriptors / Indicators  Aching    Pain Type  Surgical pain    Pain Onset  More than a month ago    Pain Frequency  Constant    Aggravating Factors   walk and stand    Pain Relieving Factors  meds                       [facility] Adult PT Treatment/Exercise - 01/06/19 0001      Exercises   Exercises  Knee/Hip  Knee/Hip Exercises: Aerobic   Nustep  L8  8 min LLE only      Knee/Hip Exercises: Machines for Strengthening   Cybex Knee Extension  3x10 20 # bilaterally      Knee/Hip Exercises: Standing   Heel Raises Limitations  heel toe raises X 30 ea    Hip Flexion  Right;Left;20 reps    Hip Flexion Limitations  5#    Hip Abduction  Right;Left;20 reps    Abduction Limitations  5#    Hip Extension  Right;Left;20 reps    Extension Limitations  5#    Lateral Step Up  Left;Hand Hold: 1;Step Height: 8";20 reps;Right;15 reps    Forward Step Up  Right;Left;15 reps;Hand Hold: 2;Step Height: 8"    Functional Squat  20 reps    Functional Squat Limitations  hold to bar  and supervised      Knee/Hip Exercises: Seated   Sit to Sand  5 reps;without UE support   9 sec  then 5 reps arms across chest  17 sec.  then 5 reps      Cryotherapy   Number Minutes Cryotherapy  10 Minutes    Cryotherapy Location  Hip   Lt ant    Type of Cryotherapy  Ice pack                  PT Long Term Goals - 12/30/18 1641      PT LONG TERM GOAL #1   Title  Pt will be I and compliant with HEP. (Target goal for all goals 6 weeks 01/15/19)    Status  On-going      PT LONG TERM GOAL #2   Title  Pt will improve ROM to [facility] to improve mobility    Status  On-going      PT LONG TERM GOAL #3   Title  He will report pain decreased 50% or more  in LT hip with normal activity including yardwork    Baseline  2-3 but not doing alot of yard work    Status  Partially Met      PT LONG TERM GOAL #4   Title  Pt will improve Lt hip strength to at least 5-/5 MMT to improve function    Status  On-going      PT LONG TERM GOAL #5   Title  He will improve 5TSTS test to 12 seconds or less    Status  On-going            Plan - 01/06/19 1536    Clinical Impression Statement  tolerated knee ext machine and step ups with 5# on  each ankle   progressing nicely.   Sit to stand still has tikme that exceeds what would indicate  d ecreased risk of fall.    PT Treatment/Interventions  Aquatic Therapy;Electrical Stimulation;Cryotherapy;Iontophoresis '4mg'$ /ml Dexamethasone;Moist Heat;Ultrasound;Gait training;Stair training;Therapeutic activities;Therapeutic exercise;Balance training;Neuromuscular re-education;Passive range of motion;Dry needling;Joint Manipulations    PT Next Visit Plan  progress HEP as needed with bands,  Lt/RT hip stretching and strengthening, gait    PT Home Exercise Plan  standing march and hip abd, hamstring stretch, sit to stands, heel toe raises, LAQ       Patient will benefit from skilled therapeutic intervention in order to improve the following deficits and impairments:  Abnormal gait, Decreased activity tolerance, Decreased balance, Decreased mobility, Decreased range of motion, Decreased strength, Difficulty walking, Impaired flexibility, Pain  Visit Diagnosis: Pain in left hip  Difficulty in walking, not  elsewhere classified  Stiffness of left hip, not elsewhere classified  Muscle weakness (generalized)     Problem List Patient Active Problem List   Diagnosis Date Noted  . Primary localized osteoarthritis of left hip 11/18/2018  . Primary osteoarthritis of left hip 11/18/2018  . Left hip pain secondary to OA  08/13/2018  . Microcytic anemia 08/13/2018  . Urinary retention 11/08/2017  . Liver fibrosis 04/30/2017  . NSAID long-term use 01/09/2016  . Chronic back pain secondary to DJD of cervical, thoracic and lumbar spine and disc herniation 01/09/2016  . Osteoarthritis of both knees 11/02/2011  . FOOT PAIN, BILATERAL 09/20/2009  . DISORDER, DEPRESSIVE NEC 09/25/2006  . HERPES LABIALIS 04/04/2006  . Chronic hepatitis C without hepatic coma ([place]) 02/22/2006  . Essential hypertension 02/22/2006    Greg Flowers  PT 01/06/2019, 4:17 PM  [facility] [address] [place], [place], [address] Phone: [phone]   Fax:  [phone]  Name: Greg Flowers MRN: [id number] Date of Birth: 1955-06-18

## 2019-01-08 ENCOUNTER — Ambulatory Visit: Payer: Federal, State, Local not specified - PPO

## 2019-01-08 ENCOUNTER — Other Ambulatory Visit: Payer: Self-pay

## 2019-01-08 DIAGNOSIS — M6281 Muscle weakness (generalized): Secondary | ICD-10-CM

## 2019-01-08 DIAGNOSIS — M25652 Stiffness of left hip, not elsewhere classified: Secondary | ICD-10-CM | POA: Diagnosis not present

## 2019-01-08 DIAGNOSIS — M25552 Pain in left hip: Secondary | ICD-10-CM | POA: Diagnosis not present

## 2019-01-08 DIAGNOSIS — R262 Difficulty in walking, not elsewhere classified: Secondary | ICD-10-CM

## 2019-01-08 NOTE — Therapy (Signed)
[place], [place], [address] Phone: [phone]   Fax:  [phone]  Physical Therapy Evaluation  Patient Details  Name: Greg Flowers MRN: [id number] Date of Birth: 01-16-1956 Referring Provider (PT): Melrose Nakayama   Encounter Date: 01/08/2019  PT End of Session - 01/08/19 1350    Visit Number  11    Number of Visits  16    Date for PT Re-Evaluation  01/15/19    Authorization Type  [employer] federal    PT Start Time  0150    PT Stop Time  0235    PT Time Calculation (min)  45 min    Activity Tolerance  Patient tolerated treatment well    Behavior During Therapy  [facility] for tasks assessed/performed       Past Medical History:  Diagnosis Date  . Anxiety   . Calculus, kidney 2000   Sees Dr. Risa Grill, urology.   . Cervical spondylosis 2004   sp surgery by Dr. Ellene Route  . Depression   . GERD (gastroesophageal reflux disease)   . Headache(784.0)    Chronic, on vicodin 5 TID for this.   . Hepatitis C    Has occasional visits with Dr. Oletta Lamas GI, failed RX in 2000.  Liver Biopsy 9/06: Minimally active hepatitis consistent with hepatitis C, minimal necroinflamtory activity grade 1, no incrased fibrosis stage 0.   . Herpes labialis   . History of kidney stones   . Hypertension   . Pneumonia   . Renal stone   . Spondylolisthesis of lumbar region   . Thalassemia    HGB 9/09 14.4 wtih MCV 72.6  . Wears glasses     Past Surgical History:  Procedure Laterality Date  . ANTERIOR CERVICAL DECOMP/DISCECTOMY FUSION N/A 11/21/2017   Procedure: ANTERIOR CERVICAL DECOMPRESSION FUSION, CERVICAL THREE-FOUR, CERVICAL FOUR-FIVE WITH INSTRUMENTATION AND ALLOGRAFT;  Surgeon: Phylliss Bob, MD;  Location: [place];  Service: Orthopedics;  Laterality: N/A;  ANTERIOR CERVICAL DECOMPRESSION FUSION, CERVICAL THREE-FOUR, CERVICAL FOUR-FIVE WITH INSTRUMENTATION AND ALLOGRAFT  . BACK SURGERY  2017   L4-L5 PLATE/SCREWS  . CERVICAL  DISCECTOMY  2004   Dr Ellene Route  . COLONOSCOPY    . LITHOTRIPSY  2004   Dr Amalia Hailey  . NASAL SEPTUM SURGERY  2007  . TONSILLECTOMY    . TOTAL HIP ARTHROPLASTY Left 11/18/2018   Procedure: Left Anterior Hip Arthroplasty;  Surgeon: Melrose Nakayama, MD;  Location: [facility];  Service: Orthopedics;  Laterality: Left;    There were no vitals filed for this visit.   Subjective Assessment - 01/08/19 1357    Subjective  hip good but a little sore but Lt posterior shoulder hurts when i lye on it.  He states he does not think he can mow his grass. He also feels his balance is not good.    Pain Score  2     Pain Location  Hip                    Objective measurements completed on examination: See above findings.      [facility] Adult PT Treatment/Exercise - 01/08/19 0001      Knee/Hip Exercises: Standing   Heel Raises Limitations  heel toe raises X 30 ea    Hip Flexion  Right;Left;20 reps    Hip Flexion Limitations  6#    Hip Abduction  Right;Left;20 reps    Abduction Limitations  6#    Hip Extension  Right;Left;20 reps  Extension Limitations  6#    Functional Squat Limitations  hold to bar  and supervised x 30 reps    Other Standing Knee Exercises  marching 6# each ankle x 25         Mat exercises per HEP         PT Long Term Goals - 12/30/18 1641      PT LONG TERM GOAL #1   Title  Pt will be I and compliant with HEP. (Target goal for all goals 6 weeks 01/15/19)    Status  On-going      PT LONG TERM GOAL #2   Title  Pt will improve ROM to [facility] to improve mobility    Status  On-going      PT LONG TERM GOAL #3   Title  He will report pain decreased 50% or more  in LT hip with normal activity including yardwork    Baseline  2-3 but not doing alot of yard work    Status  Partially Met      PT LONG TERM GOAL #4   Title  Pt will improve Lt hip strength to at least 5-/5 MMT to improve function    Status  On-going      PT LONG TERM GOAL #5   Title  He will improve  5TSTS test to 12 seconds or less    Status  On-going             Plan - 01/08/19 1351    Clinical Impression Statement  New pain LT posterior shoulder. May be related to ceck. .  Feels his balance is still off.  He is achey and pain in multiuple places Wih all his surgeries it is no t clear he will have general pain ongoing.    Examination-Activity Limitations  Bathing;Transfers;Locomotion Level;Carry;Sleep;Dressing;Stairs;Squat;Stand;Lift    PT Treatment/Interventions  Aquatic Therapy;Electrical Stimulation;Cryotherapy;Iontophoresis '4mg'$ /ml Dexamethasone;Moist Heat;Ultrasound;Gait training;Stair training;Therapeutic activities;Therapeutic exercise;Balance training;Neuromuscular re-education;Passive range of motion;Dry needling;Joint Manipulations    PT Next Visit Plan  progress HEP as needed with bands,  Lt/RT hip stretching and strengthening, gait,   Pushing sled to mimic mowing and BERG test to get balance  score.    PT Home Exercise Plan  standing march and hip abd, hamstring stretch, sit to stands, heel toe raises, LAQ,     mat with band hip clam and abductiona nd clam with bridge  band  added for side hip clam and abduciton , side steps , hip IR , clammnd bridge.    Consulted and Agree with Plan of Care  Patient       Patient will benefit from skilled therapeutic intervention in order to improve the following deficits and impairments:  Abnormal gait, Decreased activity tolerance, Decreased balance, Decreased mobility, Decreased range of motion, Decreased strength, Difficulty walking, Impaired flexibility, Pain  Visit Diagnosis: Pain in left hip  Difficulty in walking, not elsewhere classified  Stiffness of left hip, not elsewhere classified  Muscle weakness (generalized)     Problem List Patient Active Problem List   Diagnosis Date Noted  . Primary localized osteoarthritis of left hip 11/18/2018  . Primary osteoarthritis of left hip 11/18/2018  . Left hip pain secondary  to OA  08/13/2018  . Microcytic anemia 08/13/2018  . Urinary retention 11/08/2017  . Liver fibrosis 04/30/2017  . NSAID long-term use 01/09/2016  . Chronic back pain secondary to DJD of cervical, thoracic and lumbar spine and disc herniation 01/09/2016  . Osteoarthritis of both knees 11/02/2011  .  FOOT PAIN, BILATERAL 09/20/2009  . DISORDER, DEPRESSIVE NEC 09/25/2006  . HERPES LABIALIS 04/04/2006  . Chronic hepatitis C without hepatic coma ([place]) 02/22/2006  . Essential hypertension 02/22/2006    [name]  PT 01/08/2019, 2:42 PM  [facility] [address] [place], [place], [address] Phone: [phone]   Fax:  [phone]  Name: CLAVIN RUHLMAN MRN: [id number] Date of Birth: 12/23/1955

## 2019-01-13 ENCOUNTER — Ambulatory Visit: Payer: Federal, State, Local not specified - PPO

## 2019-01-13 ENCOUNTER — Other Ambulatory Visit: Payer: Self-pay

## 2019-01-13 DIAGNOSIS — R262 Difficulty in walking, not elsewhere classified: Secondary | ICD-10-CM | POA: Diagnosis not present

## 2019-01-13 DIAGNOSIS — M25652 Stiffness of left hip, not elsewhere classified: Secondary | ICD-10-CM

## 2019-01-13 DIAGNOSIS — M25552 Pain in left hip: Secondary | ICD-10-CM | POA: Diagnosis not present

## 2019-01-13 DIAGNOSIS — M6281 Muscle weakness (generalized): Secondary | ICD-10-CM

## 2019-01-13 NOTE — Therapy (Signed)
[place], [place], [address] Phone: [phone]   Fax:  [phone]  Physical Therapy Treatment  Patient Details  Name: Greg Flowers MRN: [id number] Date of Birth: [birth year] Referring Provider (PT): Melrose Nakayama   Encounter Date: 01/13/2019  PT End of Session - 01/13/19 1542    Visit Number  12    Number of Visits  16    Date for PT Re-Evaluation  01/15/19    Authorization Type  BCBS federal    PT Start Time  0335    PT Stop Time  0415    PT Time Calculation (min)  40 min    Activity Tolerance  Patient tolerated treatment well    Behavior During Therapy  [facility] for tasks assessed/performed       Past Medical History:  Diagnosis Date  . Anxiety   . Calculus, kidney 2000   Sees Dr. Risa Grill, urology.   . Cervical spondylosis 2004   sp surgery by Dr. Ellene Route  . Depression   . GERD (gastroesophageal reflux disease)   . Headache(784.0)    Chronic, on vicodin 5 TID for this.   . Hepatitis C    Has occasional visits with Dr. Oletta Lamas GI, failed RX in 2000.  Liver Biopsy 9/06: Minimally active hepatitis consistent with hepatitis C, minimal necroinflamtory activity grade 1, no incrased fibrosis stage 0.   . Herpes labialis   . History of kidney stones   . Hypertension   . Pneumonia   . Renal stone   . Spondylolisthesis of lumbar region   . Thalassemia    HGB 9/09 14.4 wtih MCV 72.6  . Wears glasses     Past Surgical History:  Procedure Laterality Date  . ANTERIOR CERVICAL DECOMP/DISCECTOMY FUSION N/A 11/21/2017   Procedure: ANTERIOR CERVICAL DECOMPRESSION FUSION, CERVICAL THREE-FOUR, CERVICAL FOUR-FIVE WITH INSTRUMENTATION AND ALLOGRAFT;  Surgeon: Phylliss Bob, MD;  Location: [place];  Service: Orthopedics;  Laterality: N/A;  ANTERIOR CERVICAL DECOMPRESSION FUSION, CERVICAL THREE-FOUR, CERVICAL FOUR-FIVE WITH INSTRUMENTATION AND ALLOGRAFT  . BACK SURGERY  2017   L4-L5 PLATE/SCREWS  . CERVICAL  DISCECTOMY  2004   Dr Ellene Route  . COLONOSCOPY    . LITHOTRIPSY  2004   Dr Amalia Hailey  . NASAL SEPTUM SURGERY  2007  . TONSILLECTOMY    . TOTAL HIP ARTHROPLASTY Left 11/18/2018   Procedure: Left Anterior Hip Arthroplasty;  Surgeon: Melrose Nakayama, MD;  Location[facility];  Service: Orthopedics;  Laterality: Left;    There were no vitals filed for this visit.  Subjective Assessment - 01/13/19 1541    Subjective  No complaints . Doing some light gardeniing at home.    Pain Score  2     Pain Location  Hip    Pain Orientation  Left    Pain Descriptors / Indicators  Aching    Pain Type  Surgical pain    Pain Onset  More than a month ago    Pain Frequency  Constant    Aggravating Factors   walking and standing    Pain Relieving Factors  meds , rest         [facility] PT Assessment - 01/13/19 0001      Assessment   Medical Diagnosis  Lt THA anterior approach 11/18/18    Referring Provider (PT)  Melrose Nakayama      Standardized Balance Assessment   Standardized Balance Assessment  [facility] Balance Test      [facility] Balance Test  Sit to Stand  Able to stand without using hands and stabilize independently    Standing Unsupported  Able to stand safely 2 minutes    Sitting with Back Unsupported but Feet Supported on Floor or Stool  Able to sit safely and securely 2 minutes    Stand to Sit  Sits safely with minimal use of hands    Transfers  Able to transfer safely, minor use of hands    Standing Unsupported with Eyes Closed  Able to stand 10 seconds safely    Standing Unsupported with Feet Together  Able to place feet together independently and stand 1 minute safely    From Standing, Reach Forward with Outstretched Arm  Can reach confidently >25 cm (10")    From Standing Position, Pick up Object from Floor  Able to pick up shoe safely and easily    From Standing Position, Turn to Look Behind Over each Shoulder  Looks behind from both sides and weight shifts well    Turn 360 Degrees  Able to turn 360  degrees safely in 4 seconds or less    Standing Unsupported, Alternately Place Feet on Step/Stool  Able to complete 4 steps without aid or supervision    Standing Unsupported, One Foot in Front  Able to plae foot ahead of the other independently and hold 30 seconds    Standing on One Leg  Tries to lift leg/unable to hold 3 seconds but remains standing independently    Total Score  50    Berg comment:  Safe without device for normal walking  on level surfaces  cane at times out side.                    [facility] Adult PT Treatment/Exercise - 01/13/19 0001      Therapeutic Activites    Therapeutic Activities  Other Therapeutic Activities    Other Therapeutic Activities  200 feet x 2 push work sled Discussed enefitts of self propelled mower  and to try one to be sure the pull of themower is not too strong      Neuro Re-ed    Neuro Re-ed Details   Tandem stepping and hold 10-15 sec RT and LT,  exagerated heel strike and hold for DF strength and balance      Knee/Hip Exercises: Aerobic   Nustep  L8  8 min LLE only             PT Education - 01/13/19 1619    Education Details  work in Health and safety inspector with tandem  balance and  heel strike    Person(s) Educated  Patient    Methods  Explanation;Demonstration    Comprehension  Returned demonstration;Verbalized understanding          PT Long Term Goals - 12/30/18 1641      PT LONG TERM GOAL #1   Title  Pt will be I and compliant with HEP. (Target goal for all goals 6 weeks 01/15/19)    Status  On-going      PT LONG TERM GOAL #2   Title  Pt will improve ROM to [facility] to improve mobility    Status  On-going      PT LONG TERM GOAL #3   Title  He will report pain decreased 50% or more  in LT hip with normal activity including yardwork    Baseline  2-3 but not doing alot of yard work    Status  Partially Met  PT LONG TERM GOAL #4   Title  Pt will improve Lt hip strength to at least 5-/5 MMT to improve function    Status   On-going      PT LONG TERM GOAL #5   Title  He will improve 5TSTS test to 12 seconds or less    Status  On-going            Plan - 01/13/19 1543    Clinical Impression Statement  Balance screen indicated decr balance with narrow BOS and lifting feet up. Marland Kitchen He reported catching feet with   walking  so we worked on DF  active with walking. He was able to push the work sled without pain and he reported this was more difficult that using his mower at home.    PT Treatment/Interventions  Aquatic Therapy;Electrical Stimulation;Cryotherapy;Iontophoresis 61m/ml Dexamethasone;Moist Heat;Ultrasound;Gait training;Stair training;Therapeutic activities;Therapeutic exercise;Balance training;Neuromuscular re-education;Passive range of motion;Dry needling;Joint Manipulations    PT Next Visit Plan  progress HEP as needed with bands,  Lt/RT hip stretching and strengthening, gait, emphasis on DF and narrower BOS   Pushing sled to mimic mowing..Marland KitchenMMT  Sit to stand 5   Goalsreps test    PT Home Exercise Plan  standing march and hip abd, hamstring stretch, sit to stands, heel toe raises, LAQ,     mat with band hip clam and abductiona nd clam with bridge  band  added for side hip clam and abduciton , side steps , hip IR , clammnd bridge.  tandem standing and exagerated DF in standing for balance    Consulted and Agree with Plan of Care  Patient       Patient will benefit from skilled therapeutic intervention in order to improve the following deficits and impairments:  Abnormal gait, Decreased activity tolerance, Decreased balance, Decreased mobility, Decreased range of motion, Decreased strength, Difficulty walking, Impaired flexibility, Pain  Visit Diagnosis: Pain in left hip  Difficulty in walking, not elsewhere classified  Stiffness of left hip, not elsewhere classified  Muscle weakness (generalized)     Problem List Patient Active Problem List   Diagnosis Date Noted  . Primary localized  osteoarthritis of left hip 11/18/2018  . Primary osteoarthritis of left hip 11/18/2018  . Left hip pain secondary to OA  08/13/2018  . Microcytic anemia 08/13/2018  . Urinary retention 11/08/2017  . Liver fibrosis 04/30/2017  . NSAID long-term use 01/09/2016  . Chronic back pain secondary to DJD of cervical, thoracic and lumbar spine and disc herniation 01/09/2016  . Osteoarthritis of both knees 11/02/2011  . FOOT PAIN, BILATERAL 09/20/2009  . DISORDER, DEPRESSIVE NEC 09/25/2006  . HERPES LABIALIS 04/04/2006  . [place] C [place] 02/22/2006  . Essential hypertension 02/22/2006    CDarrel Hoover PT 01/13/2019, 5:29 PM  [facility] [address]Phone: [phone]  Fax:  [phone] Name: Greg CHILLEMIMRN: [id number] of Birth: 926-Jul-1957

## 2019-01-15 ENCOUNTER — Encounter: Payer: Self-pay | Admitting: Internal Medicine

## 2019-01-15 ENCOUNTER — Ambulatory Visit: Payer: Federal, State, Local not specified - PPO | Attending: Orthopaedic Surgery

## 2019-01-15 ENCOUNTER — Other Ambulatory Visit: Payer: Self-pay

## 2019-01-15 DIAGNOSIS — M25552 Pain in left hip: Secondary | ICD-10-CM | POA: Insufficient documentation

## 2019-01-15 DIAGNOSIS — M6281 Muscle weakness (generalized): Secondary | ICD-10-CM | POA: Insufficient documentation

## 2019-01-15 DIAGNOSIS — R262 Difficulty in walking, not elsewhere classified: Secondary | ICD-10-CM

## 2019-01-15 DIAGNOSIS — M25652 Stiffness of left hip, not elsewhere classified: Secondary | ICD-10-CM | POA: Diagnosis not present

## 2019-01-15 NOTE — Therapy (Signed)
[place], [place], [address] Phone: [phone]   Fax:  [phone]  Physical Therapy Treatment  Patient Details  Name: Greg Flowers MRN: [id number] Date of Birth: 09/06/1955 Referring Provider (PT): Melrose Nakayama   Encounter Date: 01/15/2019  PT End of Session - 01/15/19 1458    Visit Number  13    Number of Visits  20    Date for PT Re-Evaluation  02/13/19    Authorization Type  BCBS federal    PT Start Time  0257   late for 2:45 appointment   PT Stop Time  0330    PT Time Calculation (min)  33 min    Activity Tolerance  Patient tolerated treatment well    Behavior During Therapy  [facility] for tasks assessed/performed       Past Medical History:  Diagnosis Date  . Anxiety   . Calculus, kidney 2000   Sees Dr. Risa Grill, urology.   . Cervical spondylosis 2004   sp surgery by Dr. Ellene Route  . Depression   . GERD (gastroesophageal reflux disease)   . Headache(784.0)    Chronic, on vicodin 5 TID for this.   . Hepatitis C    Has occasional visits with Dr. Oletta Lamas GI, failed RX in 2000.  Liver Biopsy 9/06: Minimally active hepatitis consistent with hepatitis C, minimal necroinflamtory activity grade 1, no incrased fibrosis stage 0.   . Herpes labialis   . History of kidney stones   . Hypertension   . Pneumonia   . Renal stone   . Spondylolisthesis of lumbar region   . Thalassemia    HGB 9/09 14.4 wtih MCV 72.6  . Wears glasses     Past Surgical History:  Procedure Laterality Date  . ANTERIOR CERVICAL DECOMP/DISCECTOMY FUSION N/A 11/21/2017   Procedure: ANTERIOR CERVICAL DECOMPRESSION FUSION, CERVICAL THREE-FOUR, CERVICAL FOUR-FIVE WITH INSTRUMENTATION AND ALLOGRAFT;  Surgeon: Phylliss Bob, MD;  Location: [place];  Service: Orthopedics;  Laterality: N/A;  ANTERIOR CERVICAL DECOMPRESSION FUSION, CERVICAL THREE-FOUR, CERVICAL FOUR-FIVE WITH INSTRUMENTATION AND ALLOGRAFT  . BACK SURGERY  2017   L4-L5  PLATE/SCREWS  . CERVICAL DISCECTOMY  2004   Dr Ellene Route  . COLONOSCOPY    . LITHOTRIPSY  2004   Dr Amalia Hailey  . NASAL SEPTUM SURGERY  2007  . TONSILLECTOMY    . TOTAL HIP ARTHROPLASTY Left 11/18/2018   Procedure: Left Anterior Hip Arthroplasty;  Surgeon: Melrose Nakayama, MD;  Location[facility];  Service: Orthopedics;  Laterality: Left;    There were no vitals filed for this visit.  Subjective Assessment - 01/15/19 1713    Subjective  No changes . Late today    Pain Score  2     Pain Location  Hip    Pain Orientation  Right    Pain Descriptors / Indicators  Aching    Pain Type  Surgical pain    Pain Onset  More than a month ago    Pain Frequency  Constant    Aggravating Factors   walking and standing         [facility] PT Assessment - 01/15/19 0001      Assessment   Medical Diagnosis  Lt THA anterior approach 11/18/18    Referring Provider (PT)  Melrose Nakayama      AROM   Left Hip Extension  5    Left Hip Flexion  105    Left Hip External Rotation   45    Left Hip  Internal Rotation   22    Left Hip ABduction  34      Strength   Overall Strength Comments  flexion 5-/5, abduction  3/5,  extension 4-/5,  adduction  4+/5      Berg Balance Test   Sit to Stand  Able to stand without using hands and stabilize independently    Standing Unsupported  Able to stand safely 2 minutes    Sitting with Back Unsupported but Feet Supported on Floor or Stool  Able to sit safely and securely 2 minutes    Stand to Sit  Sits safely with minimal use of hands    Transfers  Able to transfer safely, minor use of hands    Standing Unsupported with Eyes Closed  Able to stand 10 seconds safely    Standing Unsupported with Feet Together  Able to place feet together independently and stand 1 minute safely    From Standing, Reach Forward with Outstretched Arm  Can reach confidently >25 cm (10")    From Standing Position, Pick up Object from Floor  Able to pick up shoe safely and easily    From Standing  Position, Turn to Look Behind Over each Shoulder  Looks behind from both sides and weight shifts well    Turn 360 Degrees  Able to turn 360 degrees safely in 4 seconds or less    Standing Unsupported, Alternately Place Feet on Step/Stool  Able to stand independently and safely and complete 8 steps in 20 seconds    Standing Unsupported, One Foot in Front  Able to plae foot ahead of the other independently and hold 30 seconds    Standing on One Leg  Tries to lift leg/unable to hold 3 seconds but remains standing independently    Total Score  52                   [facility] Adult PT Treatment/Exercise - 01/15/19 0001      Therapeutic Activites    Other Therapeutic Activities  500 feet with sled       Neuro Re-ed    Neuro Re-ed Details   Tandem stepping and hold 10-15 sec RT and LT,  exagerated heel strike and hold for DF strength and balance      Knee/Hip Exercises: Aerobic   Nustep  L8  6 min LLE only    Stepper  arm bike L1 4 min                  PT Long Term Goals - 01/15/19 1514      PT LONG TERM GOAL #1   Title  Pt will be I and compliant with HEP. (Target goal for all goals 6 weeks 01/15/19)    Baseline  doing HEp every other day    Status  Partially Met      PT LONG TERM GOAL #3   Title  He will report pain decreased 50% or more  in LT hip with normal activity including yardwork    Baseline  2-3 with increasing frequency of work in yard of yard work    Status  On-going      PT LONG TERM GOAL #5   Title  He will improve 5TSTS test to 12 seconds or less    Baseline  14 sec  on first attempt,         second attempt  13 sec  third attempt,  12 sec    Status  Partially Met            Plan - 01/15/19 1459    Clinical Impression Statement  General assessment today for renewal of PT for October.   Better on Berg since earlier this week , He is still weak in LT hip and probably due to only doing HEP every other day.  Pain with Lt hip  flexion probably related to lack of stretching at home. Should benefit from extension of Pt but no need to extend past end of the month. Asked him to pyush mower for exercise to prepare for the spring whenhe resumes yard care.    PT Frequency  2x / week    PT Duration  4 weeks    PT Treatment/Interventions  Aquatic Therapy;Electrical Stimulation;Cryotherapy;Iontophoresis 80m/ml Dexamethasone;Moist Heat;Ultrasound;Gait training;Stair training;Therapeutic activities;Therapeutic exercise;Balance training;Neuromuscular re-education;Passive range of motion;Dry needling;Joint Manipulations    PT Next Visit Plan  progress HEP as needed with bands,  Lt/RT hip stretching and strengthening, gait, emphasis on DF and narrower BOS   Pushing sled to mimic mowing..Earleen Newporthim resume daily exercise for hip flexion stretching and appropriate exercis for hip strengthew HEP and                  FOTO   PT Home Exercise Plan  standing march and hip abd, hamstring stretch, sit to stands, heel toe raises, LAQ,     mat with band hip clam and abduction and clam with bridge  band  added for side hip clam and abduciton , side steps , hip IR , clammnd bridge.  tandem standing and exagerated DF in standing for balance    Consulted and Agree with Plan of Care  Patient       Patient will benefit from skilled therapeutic intervention in order to improve the following deficits and impairments:  Abnormal gait, Decreased activity tolerance, Decreased balance, Decreased mobility, Decreased range of motion, Decreased strength, Difficulty walking, Impaired flexibility, Pain  Visit Diagnosis: Difficulty in walking, not elsewhere classified  Stiffness of left hip, not elsewhere classified  Pain in left hip  Muscle weakness (generalized)     Problem List Patient Active Problem List   Diagnosis Date Noted  . Primary localized osteoarthritis of left hip 11/18/2018  . Primary osteoarthritis of left hip 11/18/2018  . Left hip  pain secondary to OA  08/13/2018  . Microcytic anemia 08/13/2018  . Urinary retention 11/08/2017  . Liver fibrosis 04/30/2017  . NSAID long-term use 01/09/2016  . Chronic back pain secondary to DJD of cervical, thoracic and lumbar spine and disc herniation 01/09/2016  . Osteoarthritis of both knees 11/02/2011  . FOOT PAIN, BILATERAL 09/20/2009  . DISORDER, DEPRESSIVE NEC 09/25/2006  . HERPES LABIALIS 04/04/2006  . Chronic hepatitis C without hepatic coma ([place] 02/22/2006  . Essential hypertension 02/22/2006    CDarrel Hoover PT 01/15/2019, 5:20 PM  [facility] [address]Phone: [phone]  Fax:  [phone] Name: Greg FEARNOWMRN: [id number] of Birth: [birth year]

## 2019-01-21 ENCOUNTER — Other Ambulatory Visit: Payer: Self-pay

## 2019-01-21 ENCOUNTER — Ambulatory Visit: Payer: Federal, State, Local not specified - PPO | Admitting: Physical Therapy

## 2019-01-21 ENCOUNTER — Ambulatory Visit (INDEPENDENT_AMBULATORY_CARE_PROVIDER_SITE_OTHER): Payer: Federal, State, Local not specified - PPO | Admitting: *Deleted

## 2019-01-21 DIAGNOSIS — Z23 Encounter for immunization: Secondary | ICD-10-CM | POA: Diagnosis not present

## 2019-01-21 DIAGNOSIS — R262 Difficulty in walking, not elsewhere classified: Secondary | ICD-10-CM | POA: Diagnosis not present

## 2019-01-21 DIAGNOSIS — M25552 Pain in left hip: Secondary | ICD-10-CM | POA: Diagnosis not present

## 2019-01-21 DIAGNOSIS — M6281 Muscle weakness (generalized): Secondary | ICD-10-CM | POA: Diagnosis not present

## 2019-01-21 DIAGNOSIS — M25652 Stiffness of left hip, not elsewhere classified: Secondary | ICD-10-CM | POA: Diagnosis not present

## 2019-01-21 NOTE — Therapy (Signed)
[place], [place], [address] Phone: [phone]   Fax:  [phone]  Physical Therapy Treatment  Patient Details  Name: Greg Flowers MRN: [id number] Date of Birth: Aug 16, 1955 Referring Provider (PT): Melrose Nakayama   Encounter Date: 01/21/2019  PT End of Session - 01/21/19 1752    Visit Number  14    Number of Visits  20    Date for PT Re-Evaluation  02/13/19    Authorization Type  BCBS federal    PT Start Time  0247    PT Stop Time  0330    PT Time Calculation (min)  43 min    Activity Tolerance  Patient tolerated treatment well    Behavior During Therapy  [facility] for tasks assessed/performed       Past Medical History:  Diagnosis Date  . Anxiety   . Calculus, kidney 2000   Sees Dr. Risa Grill, urology.   . Cervical spondylosis 2004   sp surgery by Dr. Ellene Route  . Depression   . GERD (gastroesophageal reflux disease)   . Headache(784.0)    Chronic, on vicodin 5 TID for this.   . Hepatitis C    Has occasional visits with Dr. Oletta Lamas GI, failed RX in 2000.  Liver Biopsy 9/06: Minimally active hepatitis consistent with hepatitis C, minimal necroinflamtory activity grade 1, no incrased fibrosis stage 0.   . Herpes labialis   . History of kidney stones   . Hypertension   . Pneumonia   . Renal stone   . Spondylolisthesis of lumbar region   . Thalassemia    HGB 9/09 14.4 wtih MCV 72.6  . Wears glasses     Past Surgical History:  Procedure Laterality Date  . ANTERIOR CERVICAL DECOMP/DISCECTOMY FUSION N/A 11/21/2017   Procedure: ANTERIOR CERVICAL DECOMPRESSION FUSION, CERVICAL THREE-FOUR, CERVICAL FOUR-FIVE WITH INSTRUMENTATION AND ALLOGRAFT;  Surgeon: Phylliss Bob, MD;  Location: [place];  Service: Orthopedics;  Laterality: N/A;  ANTERIOR CERVICAL DECOMPRESSION FUSION, CERVICAL THREE-FOUR, CERVICAL FOUR-FIVE WITH INSTRUMENTATION AND ALLOGRAFT  . BACK SURGERY  2017   L4-L5 PLATE/SCREWS  . CERVICAL  DISCECTOMY  2004   Dr Ellene Route  . COLONOSCOPY    . LITHOTRIPSY  2004   Dr Amalia Hailey  . NASAL SEPTUM SURGERY  2007  . TONSILLECTOMY    . TOTAL HIP ARTHROPLASTY Left 11/18/2018   Procedure: Left Anterior Hip Arthroplasty;  Surgeon: Melrose Nakayama, MD;  Location[facility];  Service: Orthopedics;  Laterality: Left;    There were no vitals filed for this visit.  Subjective Assessment - 01/21/19 1530    Subjective  Some 3/10 pain in Lt posterior hip.    Pertinent History  Lt THA 11/18/18, cervical ACDF 2019, back surgery 2017,    How long can you stand comfortably?  few minutes only    Pain Onset  More than a month ago                       [facility] Adult PT Treatment/Exercise - 01/21/19 0001      Therapeutic Activites    Other Therapeutic Activities  500 feet with sled       Neuro Re-ed    Neuro Re-ed Details   tandem walk, retro walk, walking with head turns for dynamic balance      Knee/Hip Exercises: Aerobic   Nustep  L8  6 min LLE only      Knee/Hip Exercises: Machines for Strengthening   Cybex Leg  Press  65 lbs 3X12      Knee/Hip Exercises: Standing   Other Standing Knee Exercises  marching, hip abd, hip ext, h.S curls 2X15 ea with 6 lbs                  PT Long Term Goals - 01/15/19 1514      PT LONG TERM GOAL #1   Title  Pt will be I and compliant with HEP. (Target goal for all goals 6 weeks 01/15/19)    Baseline  doing HEp every other day    Status  Partially Met      PT LONG TERM GOAL #3   Title  He will report pain decreased 50% or more  in LT hip with normal activity including yardwork    Baseline  2-3 with increasing frequency of work in yard of yard work    Status  On-going      PT [place] #5   Title  He will improve 5TSTS test to 12 seconds or less    Baseline  14 sec  on first attempt,         second attempt  13 sec                       third attempt,  12 sec    Status  Partially Met            Plan - 01/21/19 1807     Clinical Impression Statement  Able to perform hip strengthening and dynamic balance with only mild complaints. He shows good effort with PT. Continue POC    PT Frequency  2x / week    PT Duration  4 weeks    PT Treatment/Interventions  Aquatic Therapy;Electrical Stimulation;Cryotherapy;Iontophoresis '4mg'$ /ml Dexamethasone;Moist Heat;Ultrasound;Gait training;Stair training;Therapeutic activities;Therapeutic exercise;Balance training;Neuromuscular re-education;Passive range of motion;Dry needling;Joint Manipulations    PT Next Visit Plan  progress HEP as needed with bands,  Lt/RT hip stretching and strengthening, gait, emphasis on DF and narrower BOS   Pushing sled to mimic mowing.Earleen [place] him resume daily exercise for hip flexion stretching and appropriate exercis for hip strengthew HEP and    PT Home Exercise Plan  standing march and hip abd, hamstring stretch, sit to stands, heel toe raises, LAQ,     mat with band hip clam and abduction and clam with bridge  band  added for side hip clam and abduciton , side steps , hip IR , clammnd bridge.  tandem standing and exagerated DF in standing for balance    Consulted and Agree with Plan of Care  Patient       Patient will benefit from skilled therapeutic intervention in order to improve the following deficits and impairments:  Abnormal gait, Decreased activity tolerance, Decreased balance, Decreased mobility, Decreased range of motion, Decreased strength, Difficulty walking, Impaired flexibility, Pain  Visit Diagnosis: Difficulty in walking, not elsewhere classified  Stiffness of left hip, not elsewhere classified  Pain in left hip  Muscle weakness (generalized)     Problem List Patient Active Problem List   Diagnosis Date Noted  . Primary localized osteoarthritis of left hip 11/18/2018  . Primary osteoarthritis of left hip 11/18/2018  . Left hip pain secondary to OA  08/13/2018  . Microcytic anemia 08/13/2018  . Urinary retention  11/08/2017  . Liver fibrosis 04/30/2017  . NSAID long-term use 01/09/2016  . Chronic back pain secondary to DJD of cervical, thoracic and lumbar spine and disc  herniation 01/09/2016  . Osteoarthritis of both knees 11/02/2011  . FOOT PAIN, BILATERAL 09/20/2009  . DISORDER, DEPRESSIVE NEC 09/25/2006  . HERPES LABIALIS 04/04/2006  . Chronic hepatitis C without hepatic coma ([place]) 02/22/2006  . Essential hypertension 02/22/2006    Debbe Odea, PT,DPT 01/21/2019, 6:11 PM  [facility] [address] [place], [place], [address] Phone: [phone]   Fax:  [phone]  Name: RIDGELY ANASTACIO MRN: [id number] Date of Birth: 25-Jan-1956

## 2019-01-25 ENCOUNTER — Other Ambulatory Visit: Payer: Self-pay | Admitting: Internal Medicine

## 2019-01-25 DIAGNOSIS — M10379 Gout due to renal impairment, unspecified ankle and foot: Secondary | ICD-10-CM

## 2019-01-26 ENCOUNTER — Ambulatory Visit: Payer: Federal, State, Local not specified - PPO

## 2019-01-26 ENCOUNTER — Other Ambulatory Visit: Payer: Self-pay

## 2019-01-26 DIAGNOSIS — M25652 Stiffness of left hip, not elsewhere classified: Secondary | ICD-10-CM | POA: Diagnosis not present

## 2019-01-26 DIAGNOSIS — M6281 Muscle weakness (generalized): Secondary | ICD-10-CM | POA: Diagnosis not present

## 2019-01-26 DIAGNOSIS — M25552 Pain in left hip: Secondary | ICD-10-CM | POA: Diagnosis not present

## 2019-01-26 DIAGNOSIS — R262 Difficulty in walking, not elsewhere classified: Secondary | ICD-10-CM

## 2019-01-26 NOTE — Therapy (Signed)
[place], [place], [address] Phone: [phone]   Fax:  [phone]  Physical Therapy Treatment  Patient Details  Name: Greg Flowers MRN: [id number] Date of Birth: [birth year] Referring Provider (PT): Melrose Nakayama   Encounter Date: 01/26/2019  PT End of Session - 01/26/19 1612    Visit Number  15    Number of Visits  20    Date for PT Re-Evaluation  02/13/19    Authorization Type  BC[employer] federal    PT Start Time  [phone]    PT Stop Time  0510    PT Time Calculation (min)  58 min    Activity Tolerance  Patient tolerated treatment well    Behavior During Therapy  [facility] for tasks assessed/performed       Past Medical History:  Diagnosis Date  . Anxiety   . Calculus, kidney 2000   Sees Dr. Risa Grill, urology.   . Cervical spondylosis 2004   sp surgery by Dr. Ellene Route  . Depression   . GERD (gastroesophageal reflux disease)   . Headache(784.0)    Chronic, on vicodin 5 TID for this.   . Hepatitis C    Has occasional visits with Dr. Oletta Lamas GI, failed RX in 2000.  Liver Biopsy 9/06: Minimally active hepatitis consistent with hepatitis C, minimal necroinflamtory activity grade 1, no incrased fibrosis stage 0.   . Herpes labialis   . History of kidney stones   . Hypertension   . Pneumonia   . Renal stone   . Spondylolisthesis of lumbar region   . Thalassemia    HGB 9/09 14.4 wtih MCV 72.6  . Wears glasses     Past Surgical History:  Procedure Laterality Date  . ANTERIOR CERVICAL DECOMP/DISCECTOMY FUSION N/A 11/21/2017   Procedure: ANTERIOR CERVICAL DECOMPRESSION FUSION, CERVICAL THREE-FOUR, CERVICAL FOUR-FIVE WITH INSTRUMENTATION AND ALLOGRAFT;  Surgeon: Phylliss Bob, MD;  Location: [place];  Service: Orthopedics;  Laterality: N/A;  ANTERIOR CERVICAL DECOMPRESSION FUSION, CERVICAL THREE-FOUR, CERVICAL FOUR-FIVE WITH INSTRUMENTATION AND ALLOGRAFT  . BACK SURGERY  2017   L4-L5 PLATE/SCREWS  . CERVICAL  DISCECTOMY  2004   Dr Ellene Route  . COLONOSCOPY    . LITHOTRIPSY  2004   Dr Amalia Hailey  . NASAL SEPTUM SURGERY  2007  . TONSILLECTOMY    . TOTAL HIP ARTHROPLASTY Left 11/18/2018   Procedure: Left Anterior Hip Arthroplasty;  Surgeon: Melrose Nakayama, MD;  Location: [facility];  Service: Orthopedics;  Laterality: Left;    There were no vitals filed for this visit.  Subjective Assessment - 01/26/19 1615    Subjective  no new complaints . mild Lt hip pain,  Pushed mower at home and used spreader to lime whole yard with only one break. Slopes were more difficult.                       [facility] Adult PT Treatment/Exercise - 01/26/19 0001      Therapeutic Activites    Other Therapeutic Activities  600 feet with wled      Neuro Re-ed    Neuro Re-ed Details   tandem walk, retro walk, walking  in bars with single leg balance and single leg balance with heel /toe steps  and tandem balance ,  and step backs and hold for 6-8 sec  all RT and LT,  stand Lt leg sweep RT leg across into addution and out with pivot over Lt hip with pelvis rotation.  Knee/Hip Exercises: Stretches   Hip Flexor Stretch  Left;2 reps;30 seconds    Gastroc Stretch  Both;2 reps;60 seconds    Gastroc Stretch Limitations  slant board      Knee/Hip Exercises: Aerobic   Nustep  L8  6 min LLE only      Knee/Hip Exercises: Machines for Strengthening   Cybex Leg Press  80 lbs 3X12      Knee/Hip Exercises: Standing   Functional Squat  15 reps    Functional Squat Limitations  hold to end of bars.         ice post to Lt anterior hip          PT Long Term Goals - 01/15/19 1514      PT LONG TERM GOAL #1   Title  Pt will be I and compliant with HEP. (Target goal for all goals 6 weeks 01/15/19)    Baseline  doing HEp every other day    Status  Partially Met      PT LONG TERM GOAL #3   Title  He will report pain decreased 50% or more  in LT hip with normal activity including yardwork    Baseline  2-3 with  increasing frequency of work in yard of yard work    Status  On-going      PT [place] #5   Title  He will improve 5TSTS test to 12 seconds or less    Baseline  14 sec  on first attempt,         second attempt  13 sec                       third attempt,  12 sec    Status  Partially Met            Plan - 01/26/19 1614    Clinical Impression Statement  Balance slightly off with narrow BOS.  He reported ankle pain with stance activity. He wanted to get it checked out and I agrees but probably OA.    PT Treatment/Interventions  Aquatic Therapy;Electrical Stimulation;Cryotherapy;Iontophoresis 73m/ml Dexamethasone;Moist Heat;Ultrasound;Gait training;Stair training;Therapeutic activities;Therapeutic exercise;Balance training;Neuromuscular re-education;Passive range of motion;Dry needling;Joint Manipulations    PT Next Visit Plan  progress HEP as needed with bands,  Lt/RT hip stretching and strengthening, gait, emphasis on DF and narrower BOS   Pushing sled to mimic mowing..  Have him resume daily exercise for hip flexion stretching and appropriate exercis for hip strengthe HEP    PT Home Exercise Plan  standing march and hip abd, hamstring stretch, sit to stands, heel toe raises, LAQ,     mat with band hip clam and abduction and clam with bridge  band  added for side hip clam and abduciton , side steps , hip IR , clammnd bridge.  tandem standing and exagerated DF in standing for balance    Consulted and Agree with Plan of Care  Patient       Patient will benefit from skilled therapeutic intervention in order to improve the following deficits and impairments:  Abnormal gait, Decreased activity tolerance, Decreased balance, Decreased mobility, Decreased range of motion, Decreased strength, Difficulty walking, Impaired flexibility, Pain  Visit Diagnosis: Stiffness of left hip, not elsewhere classified  Pain in left hip  Muscle weakness (generalized)  Difficulty in walking, not elsewhere  classified     Problem List Patient Active Problem List   Diagnosis Date Noted  . Primary localized osteoarthritis of left  hip 11/18/2018  . Primary osteoarthritis of left hip 11/18/2018  . Left hip pain secondary to OA  08/13/2018  . Microcytic anemia 08/13/2018  . Urinary retention 11/08/2017  . Liver fibrosis 04/30/2017  . NSAID long-term use 01/09/2016  . Chronic back pain secondary to DJD of cervical, thoracic and lumbar spine and disc herniation 01/09/2016  . Osteoarthritis of both knees 11/02/2011  . FOOT PAIN, BILATERAL 09/20/2009  . DISORDER, DEPRESSIVE NEC 09/25/2006  . HERPES LABIALIS 04/04/2006  . Chronic hepatitis C without hepatic coma ([place]) 02/22/2006  . Essential hypertension 02/22/2006    [name]  PT 01/26/2019, 5:18 PM  [facility] [address] [place], [place], [address] Phone: [phone]   Fax:  [phone]  Name: MATTHAN SLEDGE MRN: [id number] Date of Birth: 07/15/1955

## 2019-02-03 ENCOUNTER — Ambulatory Visit: Payer: Federal, State, Local not specified - PPO

## 2019-02-03 ENCOUNTER — Other Ambulatory Visit: Payer: Self-pay

## 2019-02-03 DIAGNOSIS — M6281 Muscle weakness (generalized): Secondary | ICD-10-CM | POA: Diagnosis not present

## 2019-02-03 DIAGNOSIS — M25552 Pain in left hip: Secondary | ICD-10-CM

## 2019-02-03 DIAGNOSIS — R262 Difficulty in walking, not elsewhere classified: Secondary | ICD-10-CM | POA: Diagnosis not present

## 2019-02-03 DIAGNOSIS — M25652 Stiffness of left hip, not elsewhere classified: Secondary | ICD-10-CM

## 2019-02-03 NOTE — Therapy (Signed)
[facility] [address] [place], [place], [address] Phone: [phone]   Fax:  [phone]  Physical Therapy Treatment  Patient Details  Name: Greg Flowers MRN: [id number] Date of Birth: 09/17/1955 Referring Provider (PT): Dalldorf, Peter   Encounter Date: 02/03/2019  PT End of Session - 02/03/19 1410    Visit Number  16    Number of Visits  20    Date for PT Re-Evaluation  02/13/19    Authorization Type  BCBS federal    PT Start Time  0212   late 10 min   PT Stop Time  0245    PT Time Calculation (min)  33 min    Activity Tolerance  Patient tolerated treatment well    Behavior During Therapy  [facility] for tasks assessed/performed       Past Medical History:  Diagnosis Date  . Anxiety   . Calculus, kidney 2000   Sees Dr. Grapey, urology.   . Cervical spondylosis 2004   sp surgery by Dr. Elsner  . Depression   . GERD (gastroesophageal reflux disease)   . Headache(784.0)    Chronic, on vicodin 5 TID for this.   . Hepatitis C    Has occasional visits with Dr. Edwards GI, failed RX in 2000.  Liver Biopsy 9/06: Minimally active hepatitis consistent with hepatitis C, minimal necroinflamtory activity grade 1, no incrased fibrosis stage 0.   . Herpes labialis   . History of kidney stones   . Hypertension   . Pneumonia   . Renal stone   . Spondylolisthesis of lumbar region   . Thalassemia    HGB 9/09 14.4 wtih MCV 72.6  . Wears glasses     Past Surgical History:  Procedure Laterality Date  . ANTERIOR CERVICAL DECOMP/DISCECTOMY FUSION N/A 11/21/2017   Procedure: ANTERIOR CERVICAL DECOMPRESSION FUSION, CERVICAL THREE-FOUR, CERVICAL FOUR-FIVE WITH INSTRUMENTATION AND ALLOGRAFT;  Surgeon: Dumonski, Mark, MD;  Location: [facility];  Service: Orthopedics;  Laterality: N/A;  ANTERIOR CERVICAL DECOMPRESSION FUSION, CERVICAL THREE-FOUR, CERVICAL FOUR-FIVE WITH INSTRUMENTATION AND ALLOGRAFT  . BACK SURGERY  2017   L4-L5 PLATE/SCREWS  .  CERVICAL DISCECTOMY  2004   Dr Elsner  . COLONOSCOPY    . LITHOTRIPSY  2004   Dr Evans  . NASAL SEPTUM SURGERY  2007  . TONSILLECTOMY    . TOTAL HIP ARTHROPLASTY Left 11/18/2018   Procedure: Left Anterior Hip Arthroplasty;  Surgeon: Dalldorf, Peter, MD;  Location: [facility];  Service: Orthopedics;  Laterality: Left;    There were no vitals filed for this visit.  Subjective Assessment - 02/03/19 1409    Subjective  no changes    Pain Score  2     Pain Location  Hip    Pain Orientation  Left    Pain Descriptors / Indicators  Aching    Pain Type  Surgical pain    Pain Onset  More than a month ago    Pain Frequency  Constant    Aggravating Factors   activity on feet    Pain Relieving Factors  rest, meds                       [facility] Adult PT Treatment/Exercise - 02/03/19 0001      Neuro Re-ed    Neuro Re-ed Details   stand with 20 RT/Lt toe toiuches to 8 in step  no LOB      Knee/Hip Exercises: Aerobic   Elliptical  2   min L4  fatigued       Knee/Hip Exercises: Machines for Strengthening   Cybex Leg Press  80# x 40 reps      Knee/Hip Exercises: Standing   Lateral Step Up  Right;Left;20 reps;Hand Hold: 1;Step Height: 8"    Other Standing Knee Exercises  side step 50  steps RT/LT  green band  then x 20 RT/Lt hip flexion , then hip extensioon x 20       Knee/Hip Exercises: Seated   Other Seated Knee/Hip Exercises  Hip ER/ IR with ball and green band      Modalities   Modalities  Moist Heat      Moist Heat Therapy   Number Minutes Moist Heat  12 Minutes    Moist Heat Location  Hip   not included in minutes for session                 PT Long Term Goals - 01/15/19 1514      PT LONG TERM GOAL #1   Title  Pt will be I and compliant with HEP. (Target goal for all goals 6 weeks 01/15/19)    Baseline  doing HEp every other day    Status  Partially Met      PT LONG TERM GOAL #3   Title  He will report pain decreased 50% or more  in LT hip with normal  activity including yardwork    Baseline  2-3 with increasing frequency of work in yard of yard work    Status  On-going      PT LONG TERM GOAL #5   Title  He will improve 5TSTS test to 12 seconds or less    Baseline  14 sec  on first attempt,         second attempt  13 sec                       third attempt,  12 sec    Status  Partially Met            Plan - 02/03/19 1452    Clinical Impression Statement  no changes from last session . May add 6 sessions then be done.    PT Treatment/Interventions  Aquatic Therapy;Electrical Stimulation;Cryotherapy;Iontophoresis 4mg/ml Dexamethasone;Moist Heat;Ultrasound;Gait training;Stair training;Therapeutic activities;Therapeutic exercise;Balance training;Neuromuscular re-education;Passive range of motion;Dry needling;Joint Manipulations    PT Next Visit Plan  Cont closed chain resisted exercises and balance.    PT Home Exercise Plan  standing march and hip abd, hamstring stretch, sit to stands, heel toe raises, LAQ,     mat with band hip clam and abduction and clam with bridge  band  added for side hip clam and abduciton , side steps , hip IR , clammnd bridge.  tandem standing and exagerated DF in standing for balance    Consulted and Agree with Plan of Care  Patient       Patient will benefit from skilled therapeutic intervention in order to improve the following deficits and impairments:  Abnormal gait, Decreased activity tolerance, Decreased balance, Decreased mobility, Decreased range of motion, Decreased strength, Difficulty walking, Impaired flexibility, Pain  Visit Diagnosis: Stiffness of left hip, not elsewhere classified  Pain in left hip  Muscle weakness (generalized)     Problem List Patient Active Problem List   Diagnosis Date Noted  . Primary localized osteoarthritis of left hip 11/18/2018  . Primary osteoarthritis of left hip 11/18/2018  . Left hip pain   secondary to OA  08/13/2018  . Microcytic anemia 08/13/2018  .  Urinary retention 11/08/2017  . Liver fibrosis 04/30/2017  . NSAID long-term use 01/09/2016  . Chronic back pain secondary to DJD of cervical, thoracic and lumbar spine and disc herniation 01/09/2016  . Osteoarthritis of both knees 11/02/2011  . FOOT PAIN, BILATERAL 09/20/2009  . DISORDER, DEPRESSIVE NEC 09/25/2006  . HERPES LABIALIS 04/04/2006  . Chronic hepatitis C without hepatic coma ([facility]) 02/22/2006  . Essential hypertension 02/22/2006    ,  M  PT 02/03/2019, 2:54 PM  [facility] [address] [place], [place], [address] Phone: [phone]   Fax:  [phone]  Name: Greg Flowers MRN: [id number] Date of Birth: 08/21/1955   

## 2019-02-05 ENCOUNTER — Ambulatory Visit: Payer: Federal, State, Local not specified - PPO

## 2019-02-05 ENCOUNTER — Other Ambulatory Visit: Payer: Self-pay

## 2019-02-05 DIAGNOSIS — R262 Difficulty in walking, not elsewhere classified: Secondary | ICD-10-CM | POA: Diagnosis not present

## 2019-02-05 DIAGNOSIS — M6281 Muscle weakness (generalized): Secondary | ICD-10-CM

## 2019-02-05 DIAGNOSIS — M25652 Stiffness of left hip, not elsewhere classified: Secondary | ICD-10-CM

## 2019-02-05 DIAGNOSIS — M25552 Pain in left hip: Secondary | ICD-10-CM

## 2019-02-05 NOTE — Patient Instructions (Signed)
Hip flexor stretch  Sitting 30 sec x 3 2x/day

## 2019-02-05 NOTE — Therapy (Signed)
[place], [place], [address] Phone: [phone]   Fax:  [phone]  Physical Therapy Treatment  Patient Details  Name: Greg Flowers MRN: [id number] Date of Birth: 04-Mar-1956 Referring Provider (PT): Melrose Nakayama   Encounter Date: 02/05/2019  PT End of Session - 02/05/19 1424    Visit Number  17    Number of Visits  23    Date for PT Re-Evaluation  02/27/19    Authorization Type  BCBS federal    PT Start Time  0215   15 min late   PT Stop Time  0245    PT Time Calculation (min)  30 min    Activity Tolerance  Patient tolerated treatment well    Behavior During Therapy  [facility] for tasks assessed/performed       Past Medical History:  Diagnosis Date  . Anxiety   . Calculus, kidney 2000   Sees Dr. Risa Grill, urology.   . Cervical spondylosis 2004   sp surgery by Dr. Ellene Route  . Depression   . GERD (gastroesophageal reflux disease)   . Headache(784.0)    Chronic, on vicodin 5 TID for this.   . Hepatitis C    Has occasional visits with Dr. Oletta Lamas GI, failed RX in 2000.  Liver Biopsy 9/06: Minimally active hepatitis consistent with hepatitis C, minimal necroinflamtory activity grade 1, no incrased fibrosis stage 0.   . Herpes labialis   . History of kidney stones   . Hypertension   . Pneumonia   . Renal stone   . Spondylolisthesis of lumbar region   . Thalassemia    HGB 9/09 14.4 wtih MCV 72.6  . Wears glasses     Past Surgical History:  Procedure Laterality Date  . ANTERIOR CERVICAL DECOMP/DISCECTOMY FUSION N/A 11/21/2017   Procedure: ANTERIOR CERVICAL DECOMPRESSION FUSION, CERVICAL THREE-FOUR, CERVICAL FOUR-FIVE WITH INSTRUMENTATION AND ALLOGRAFT;  Surgeon: Phylliss Bob, MD;  Location: [facility];  Service: Orthopedics;  Laterality: N/A;  ANTERIOR CERVICAL DECOMPRESSION FUSION, CERVICAL THREE-FOUR, CERVICAL FOUR-FIVE WITH INSTRUMENTATION AND ALLOGRAFT  . BACK SURGERY  2017   L4-L5 PLATE/SCREWS  .  CERVICAL DISCECTOMY  2004   Dr Ellene Route  . COLONOSCOPY    . LITHOTRIPSY  2004   Dr Amalia Hailey  . NASAL SEPTUM SURGERY  2007  . TONSILLECTOMY    . TOTAL HIP ARTHROPLASTY Left 11/18/2018   Procedure: Left Anterior Hip Arthroplasty;  Surgeon: Melrose Nakayama, MD;  [facility]: [facility];  Service: Orthopedics;  Laterality: Left;    There were no vitals filed for this visit.  Subjective Assessment - 02/05/19 1507    Subjective  mild sorenees anterior RT hip . Still waiting for grand child to be born                       [facility] Adult PT Treatment/Exercise - 02/05/19 0001      Knee/Hip Exercises: Stretches   Hip Flexor Stretch  Left;3 reps;30 seconds      Knee/Hip Exercises: Aerobic   Elliptical  3 min L4  fatigued , rested then  2 min and stopped.     Nustep  L8  5 min LLE only      Knee/Hip Exercises: Machines for Strengthening   Cybex Leg Press  100# 3x10      Knee/Hip Exercises: Seated   Long Arc Quad Weight  8 lbs.    Long Arc Quad Limitations  3x10      Moist Heat  Therapy   Number Minutes Moist Heat  12 Minutes   not during treatment, after   Moist Heat Location  Hip             PT Education - 02/05/19 1501    Education Details  HEP    Person(s) Educated  Patient    Methods  Explanation;Demonstration;Verbal cues;Handout    Comprehension  Verbalized understanding;Returned demonstration          PT Long Term Goals - 01/15/19 1514      PT LONG TERM GOAL #1   Title  Pt will be I and compliant with HEP. (Target goal for all goals 6 weeks 01/15/19)    Baseline  doing HEp every other day    Status  Partially Met      PT LONG TERM GOAL #3   Title  He will report pain decreased 50% or more  in LT hip with normal activity including yardwork    Baseline  2-3 with increasing frequency of work in yard of yard work    Status  On-going      PT [place] #5   Title  He will improve 5TSTS test to 12 seconds or less    Baseline  14 sec  on first attempt,          second attempt  13 sec                       third attempt,  12 sec    Status  Partially Met            Plan - 02/05/19 1425    Clinical Impression Statement  Have added to give 6 more session after this week. Work on [employer].    PT Treatment/Interventions  Aquatic Therapy;Electrical Stimulation;Cryotherapy;Iontophoresis 14m/ml Dexamethasone;Moist Heat;Ultrasound;Gait training;Stair training;Therapeutic activities;Therapeutic exercise;Balance training;Neuromuscular re-education;Passive range of motion;Dry needling;Joint Manipulations    PT Next Visit Plan  Cont closed chain resisted exercises and balance  , conditioning.    PT Home Exercise Plan  standing march and hip abd, hamstring stretch, sit to stands, heel toe raises, LAQ,     mat with band hip clam and abduction and clam with bridge  band  added for side hip clam and abduciton , side steps , hip IR , clammnd bridge.  tandem standing and exagerated DF in standing for balance    Consulted and Agree with Plan of Care  Patient       Patient will benefit from skilled therapeutic intervention in order to improve the following deficits and impairments:  Abnormal gait, Decreased activity tolerance, Decreased balance, Decreased mobility, Decreased range of motion, Decreased strength, Difficulty walking, Impaired flexibility, Pain  Visit Diagnosis: Stiffness of left hip, not elsewhere classified  Pain in left hip  Muscle weakness (generalized)  Difficulty in walking, not elsewhere classified     Problem List Patient Active Problem List   Diagnosis Date Noted  . Primary localized osteoarthritis of left hip 11/18/2018  . Primary osteoarthritis of left hip 11/18/2018  . Left hip pain secondary to OA  08/13/2018  . Microcytic anemia 08/13/2018  . Urinary retention 11/08/2017  . Liver fibrosis 04/30/2017  . NSAID long-term use 01/09/2016  . Chronic back pain secondary to DJD of cervical, thoracic and lumbar  spine and disc herniation 01/09/2016  . Osteoarthritis of both knees 11/02/2011  . FOOT PAIN, BILATERAL 09/20/2009  . DISORDER, DEPRESSIVE NEC 09/25/2006  . HERPES LABIALIS 04/04/2006  .  Chronic hepatitis C without hepatic coma ([place]) 02/22/2006  . Essential hypertension 02/22/2006    [name] PT 02/05/2019, 3:10 PM  [facility] [address] [place], [place], [address] Phone: [phone]   Fax:  [phone]  Name: CHRISTINE SCHIEFELBEIN MRN: [id number] Date of Birth: 1955/05/29

## 2019-02-10 ENCOUNTER — Other Ambulatory Visit: Payer: Self-pay

## 2019-02-10 ENCOUNTER — Ambulatory Visit: Payer: Federal, State, Local not specified - PPO

## 2019-02-10 DIAGNOSIS — R262 Difficulty in walking, not elsewhere classified: Secondary | ICD-10-CM

## 2019-02-10 DIAGNOSIS — M25652 Stiffness of left hip, not elsewhere classified: Secondary | ICD-10-CM | POA: Diagnosis not present

## 2019-02-10 DIAGNOSIS — M6281 Muscle weakness (generalized): Secondary | ICD-10-CM

## 2019-02-10 DIAGNOSIS — M25552 Pain in left hip: Secondary | ICD-10-CM

## 2019-02-10 NOTE — Therapy (Signed)
[name], [place], [address] Phone: [phone]   Fax:  [phone]  Physical Therapy Treatment  Patient Details  Name: Greg Flowers MRN: [id number] Date of Birth: 11-21-1955 Referring Provider (PT): Melrose Nakayama   Encounter Date: 02/10/2019  PT End of Session - 02/10/19 1411    Visit Number  18    Number of Visits  23    Date for PT Re-Evaluation  02/27/19    Authorization Type  BCBS federal    PT Start Time  0212   late 10 min   PT Stop Time  0245    PT Time Calculation (min)  33 min    Activity Tolerance  Patient tolerated treatment well    Behavior During Therapy  [facility] for tasks assessed/performed       Past Medical History:  Diagnosis Date  . Anxiety   . Calculus, kidney 2000   Sees Dr. Risa Grill, urology.   . Cervical spondylosis 2004   sp surgery by Dr. Ellene Route  . Depression   . GERD (gastroesophageal reflux disease)   . Headache(784.0)    Chronic, on vicodin 5 TID for this.   . Hepatitis C    Has occasional visits with Dr. Oletta Lamas GI, failed RX in 2000.  Liver Biopsy 9/06: Minimally active hepatitis consistent with hepatitis C, minimal necroinflamtory activity grade 1, no incrased fibrosis stage 0.   . Herpes labialis   . History of kidney stones   . Hypertension   . Pneumonia   . Renal stone   . Spondylolisthesis of lumbar region   . Thalassemia    HGB 9/09 14.4 wtih MCV 72.6  . Wears glasses     Past Surgical History:  Procedure Laterality Date  . ANTERIOR CERVICAL DECOMP/DISCECTOMY FUSION N/A 11/21/2017   Procedure: ANTERIOR CERVICAL DECOMPRESSION FUSION, CERVICAL THREE-FOUR, CERVICAL FOUR-FIVE WITH INSTRUMENTATION AND ALLOGRAFT;  Surgeon: Phylliss Bob, MD;  Location: [facility];  Service: Orthopedics;  Laterality: N/A;  ANTERIOR CERVICAL DECOMPRESSION FUSION, CERVICAL THREE-FOUR, CERVICAL FOUR-FIVE WITH INSTRUMENTATION AND ALLOGRAFT  . BACK SURGERY  2017   L4-L5 PLATE/SCREWS  .  CERVICAL DISCECTOMY  2004   Dr Ellene Route  . COLONOSCOPY    . LITHOTRIPSY  2004   Dr Amalia Hailey  . NASAL SEPTUM SURGERY  2007  . TONSILLECTOMY    . TOTAL HIP ARTHROPLASTY Left 11/18/2018   Procedure: Left Anterior Hip Arthroplasty;  Surgeon: Melrose Nakayama, MD;  Location[facility];  Service: Orthopedics;  Laterality: Left;    There were no vitals filed for this visit.  Subjective Assessment - 02/10/19 1443    Subjective  mild sorenees anterior RT hip . Still waiting for grand child to be born    Pain Score  2     Pain Location  Hip    Pain Orientation  Left;Anterior    Pain Descriptors / Indicators  Aching    Pain Type  Surgical pain    Pain Onset  More than a month ago    Pain Frequency  Constant    Aggravating Factors   Activity on feet    Pain Relieving Factors  rest , meds                       [facility] Adult PT Treatment/Exercise - 02/10/19 0001      Knee/Hip Exercises: Aerobic   Elliptical  3 min L4  fatigued , rested then  2 min and stopped.  Recumbent Bike  L3 6 min    Nustep  L8 6 min LE      Knee/Hip Exercises: Machines for Strengthening   Cybex Leg Press  120# 3 x 10 reps      Knee/Hip Exercises: Standing   Lateral Step Up  Right;Left;20 reps;Hand Hold: 1       Sit to stand x 20           PT Long Term Goals - 01/15/19 1514      PT LONG TERM GOAL #1   Title  Pt will be I and compliant with HEP. (Target goal for all goals 6 weeks 01/15/19)    Baseline  doing HEp every other day    Status  Partially Met      PT LONG TERM GOAL #3   Title  He will report pain decreased 50% or more  in LT hip with normal activity including yardwork    Baseline  2-3 with increasing frequency of work in yard of yard work    Status  On-going      PT [place] #5   Title  He will improve 5TSTS test to 12 seconds or less    Baseline  14 sec  on first attempt,         second attempt  13 sec                       third attempt,  12 sec    Status  Partially Met             Plan - 02/10/19 1412    Clinical Impression Statement  Late again  today. so limited session    PT Treatment/Interventions  Aquatic Therapy;Electrical Stimulation;Cryotherapy;Iontophoresis 22m/ml Dexamethasone;Moist Heat;Ultrasound;Gait training;Stair training;Therapeutic activities;Therapeutic exercise;Balance training;Neuromuscular re-education;Passive range of motion;Dry needling;Joint Manipulations    PT Home Exercise Plan  standing march and hip abd, hamstring stretch, sit to stands, heel toe raises, LAQ,     mat with band hip clam and abduction and clam with bridge  band  added for side hip clam and abduciton , side steps , hip IR , clammnd bridge.  tandem standing and exagerated DF in standing for balance    Consulted and Agree with Plan of Care  Patient       Patient will benefit from skilled therapeutic intervention in order to improve the following deficits and impairments:  Abnormal gait, Decreased activity tolerance, Decreased balance, Decreased mobility, Decreased range of motion, Decreased strength, Difficulty walking, Impaired flexibility, Pain  Visit Diagnosis: Pain in left hip  Stiffness of left hip, not elsewhere classified  Muscle weakness (generalized)  Difficulty in walking, not elsewhere classified     Problem List Patient Active Problem List   Diagnosis Date Noted  . Primary localized osteoarthritis of left hip 11/18/2018  . Primary osteoarthritis of left hip 11/18/2018  . Left hip pain secondary to OA  08/13/2018  . Microcytic anemia 08/13/2018  . Urinary retention 11/08/2017  . Liver fibrosis 04/30/2017  . NSAID long-term use 01/09/2016  . Chronic back pain secondary to DJD of cervical, thoracic and lumbar spine and disc herniation 01/09/2016  . Osteoarthritis of both knees 11/02/2011  . FOOT PAIN, BILATERAL 09/20/2009  . DISORDER, DEPRESSIVE NEC 09/25/2006  . HERPES LABIALIS 04/04/2006  . Chronic hepatitis C without hepatic coma ([place]  02/22/2006  . Essential hypertension 02/22/2006    CDarrel Hoover PT 02/10/2019, 2:44 PM  [facility] [address] [place], [place], [address] Phone: [phone]   Fax:  [phone]  Name: Greg Flowers MRN: [id number] Date of Birth: 23-Nov-1955

## 2019-02-12 ENCOUNTER — Ambulatory Visit: Payer: Federal, State, Local not specified - PPO

## 2019-02-16 DIAGNOSIS — Z96642 Presence of left artificial hip joint: Secondary | ICD-10-CM | POA: Diagnosis not present

## 2019-02-16 DIAGNOSIS — M7062 Trochanteric bursitis, left hip: Secondary | ICD-10-CM | POA: Diagnosis not present

## 2019-02-17 ENCOUNTER — Ambulatory Visit: Payer: Federal, State, Local not specified - PPO

## 2019-02-19 ENCOUNTER — Ambulatory Visit: Payer: Federal, State, Local not specified - PPO

## 2019-02-24 ENCOUNTER — Ambulatory Visit: Payer: Federal, State, Local not specified - PPO

## 2019-02-26 ENCOUNTER — Ambulatory Visit: Payer: Federal, State, Local not specified - PPO

## 2019-03-03 ENCOUNTER — Ambulatory Visit: Payer: Federal, State, Local not specified - PPO

## 2019-03-05 ENCOUNTER — Ambulatory Visit: Payer: Federal, State, Local not specified - PPO

## 2019-03-10 ENCOUNTER — Ambulatory Visit: Payer: Federal, State, Local not specified - PPO | Attending: Orthopaedic Surgery

## 2019-03-10 ENCOUNTER — Other Ambulatory Visit: Payer: Self-pay

## 2019-03-10 DIAGNOSIS — M25652 Stiffness of left hip, not elsewhere classified: Secondary | ICD-10-CM | POA: Insufficient documentation

## 2019-03-10 DIAGNOSIS — M6281 Muscle weakness (generalized): Secondary | ICD-10-CM | POA: Insufficient documentation

## 2019-03-10 DIAGNOSIS — R262 Difficulty in walking, not elsewhere classified: Secondary | ICD-10-CM

## 2019-03-10 NOTE — Therapy (Signed)
[facility] [address] [place], [place], [address] Phone: [phone]   Fax:  [phone]  Physical Therapy Treatment  Patient Details  Name: Greg Flowers MRN: [id number] Date of Birth: 01/31/1956 Referring Provider (PT): Marcene Corning   Encounter Date: 03/10/2019  PT End of Session - 03/10/19 1408    Visit Number  19    Number of Visits  25    Date for PT Re-Evaluation  04/10/19    Authorization Type  BCBS federal    PT Start Time  0209    PT Stop Time  0245    PT Time Calculation (min)  36 min    Activity Tolerance  Patient tolerated treatment well    Behavior During Therapy  [facility] for tasks assessed/performed       Past Medical History:  Diagnosis Date  . Anxiety   . Calculus, kidney 2000   Sees Dr. Isabel Caprice, urology.   . Cervical spondylosis 2004   sp surgery by Dr. Danielle Dess  . Depression   . GERD (gastroesophageal reflux disease)   . Headache(784.0)    Chronic, on vicodin 5 TID for this.   . Hepatitis C    Has occasional visits with Dr. Randa Evens GI, failed RX in 2000.  Liver Biopsy 9/06: Minimally active hepatitis consistent with hepatitis C, minimal necroinflamtory activity grade 1, no incrased fibrosis stage 0.   . Herpes labialis   . History of kidney stones   . Hypertension   . Pneumonia   . Renal stone   . Spondylolisthesis of lumbar region   . Thalassemia    HGB 9/09 14.4 wtih MCV 72.6  . Wears glasses     Past Surgical History:  Procedure Laterality Date  . ANTERIOR CERVICAL DECOMP/DISCECTOMY FUSION N/A 11/21/2017   Procedure: ANTERIOR CERVICAL DECOMPRESSION FUSION, CERVICAL THREE-FOUR, CERVICAL FOUR-FIVE WITH INSTRUMENTATION AND ALLOGRAFT;  Surgeon: Estill Bamberg, MD;  [facility];  Service: Orthopedics;  Laterality: N/A;  ANTERIOR CERVICAL DECOMPRESSION FUSION, CERVICAL THREE-FOUR, CERVICAL FOUR-FIVE WITH INSTRUMENTATION AND ALLOGRAFT  . BACK SURGERY  2017   L4-L5 PLATE/SCREWS  . CERVICAL  DISCECTOMY  2004   Dr Danielle Dess  . COLONOSCOPY    . LITHOTRIPSY  2004   Dr Logan Bores  . NASAL SEPTUM SURGERY  2007  . TONSILLECTOMY    . TOTAL HIP ARTHROPLASTY Left 11/18/2018   Procedure: Left Anterior Hip Arthroplasty;  Surgeon: Marcene Corning, MD;  [facility];  Service: Orthopedics;  Laterality: Left;    There were no vitals filed for this visit.  Subjective Assessment - 03/10/19 1410    Subjective  Had hip injection Lt hip and pain resolved  but pain returning. Will see MD in a month.    Pain Score  2     Pain Location  Hip    Pain Orientation  Left;Lateral    Pain Descriptors / Indicators  Aching    Pain Onset  More than a month ago    Pain Frequency  Intermittent    Aggravating Factors   Activity on feet    Pain Relieving Factors  rest, meds         [facility] PT Assessment - 03/10/19 0001      Assessment   Medical Diagnosis  Lt THA anterior approach 11/18/18    Referring Provider (PT)  Marcene Corning    Onset Date/Surgical Date  11/18/18      PROM   PROM Assessment Site  Hip    Right/Left Hip  Left    Left Hip Extension  10    Left Hip Flexion  110    Left Hip External Rotation   45    Left Hip Internal Rotation   30    Left Hip ABduction  43    Left Hip ADduction  27      Strength   Strength Assessment Site  Hip    Right/Left Hip  Left    Left Hip Flexion  4+/5   at 90 degrees or lower . 3+/5 abovr 90 degrees    Left Hip Extension  3-/5    Left Hip External Rotation  4/5   unable to hold at full ROM passive.    Left Hip Internal Rotation  5/5    Left Hip ABduction  3-/5   unable to hold at ful passive ROM     Flexibility   Soft Tissue Assessment /Muscle Length  yes    ITB  negative on LT.       Berg Balance Test   Sit to Stand  Able to stand without using hands and stabilize independently    Standing Unsupported  Able to stand safely 2 minutes    Sitting with Back Unsupported but Feet Supported on Floor or Stool  Able to sit safely and securely 2 minutes     Stand to Sit  Sits safely with minimal use of hands    Transfers  Able to transfer safely, minor use of hands    Standing Unsupported with Eyes Closed  Able to stand 10 seconds safely    Standing Unsupported with Feet Together  Able to place feet together independently and stand 1 minute safely    From Standing, Reach Forward with Outstretched Arm  Can reach confidently >25 cm (10")    From Standing Position, Pick up Object from Floor  Able to pick up shoe safely and easily    From Standing Position, Turn to Look Behind Over each Shoulder  Looks behind from both sides and weight shifts well    Turn 360 Degrees  Able to turn 360 degrees safely in 4 seconds or less    Standing Unsupported, Alternately Place Feet on Step/Stool  Able to stand independently and safely and complete 8 steps in 20 seconds    Standing Unsupported, One Foot in Front  Able to plae foot ahead of the other independently and hold 30 seconds    Standing on One Leg  Able to lift leg independently and hold equal to or more than 3 seconds    Total Score  53    Berg comment:  He was able to attempt single leg stand  5 sec on LT and 3 dsec on Rt but  . 2 attempts                                 PT Long Term Goals - 03/10/19 1415      PT LONG TERM GOAL #1   Title  Pt will be I and compliant with HEP. (Target goal for all goals 6 weeks 01/15/19)    Baseline  2-3x/week    Time  4    Period  Weeks    Status  On-going      PT LONG TERM GOAL #2   Title  Pt will improve ROM to [facility] to improve mobility    Baseline  50% ROM available post op  Status  On-going      PT LONG TERM GOAL #3   Title  He will report pain decreased 50% or more  in LT hip with normal activity including yardwork    Baseline  2-3/10   now but was a 5/10 before injection.    Status  On-going      PT LONG TERM GOAL #5   Title  He will improve 5TSTS test to 12 seconds or less    Baseline  14 sec on first trial,  11 on sec trial     Status  Achieved            Plan - 03/10/19 1410    Clinical Impression Statement  Only  did exercises 2-3x/week. Mr Garner NashDaniels has been away from PT for 3+ weeks. a reassessment was done and he continues with Lt hip weakness and decreased balance that may be related  to this.    PT Treatment/Interventions  Aquatic Therapy;Electrical Stimulation;Cryotherapy;Iontophoresis 4mg /ml Dexamethasone;Moist Heat;Ultrasound;Gait training;Stair training;Therapeutic activities;Therapeutic exercise;Balance training;Neuromuscular re-education;Passive range of motion;Dry needling;Joint Manipulations    PT Next Visit Plan  Cont closed chain resisted exercises and balance  , conditioning.  Finish HEP    PT Home Exercise Plan  standing march and hip abd, hamstring stretch, sit to stands, heel toe raises, LAQ,     mat with band hip clam and abduction and clam with bridge  band  added for side hip clam and abduciton , side steps , hip IR , clammnd bridge.  tandem standing and exagerated DF in standing for balance    Consulted and Agree with Plan of Care  Patient       Patient will benefit from skilled therapeutic intervention in order to improve the following deficits and impairments:  Abnormal gait, Decreased activity tolerance, Decreased balance, Decreased mobility, Decreased range of motion, Decreased strength, Difficulty walking, Impaired flexibility, Pain  Visit Diagnosis: Stiffness of left hip, not elsewhere classified  Muscle weakness (generalized)  Difficulty in walking, not elsewhere classified     Problem List Patient Active Problem List   Diagnosis Date Noted  . Primary localized osteoarthritis of left hip 11/18/2018  . Primary osteoarthritis of left hip 11/18/2018  . Left hip pain secondary to OA  08/13/2018  . Microcytic anemia 08/13/2018  . Urinary retention 11/08/2017  . Liver fibrosis 04/30/2017  . NSAID long-term use 01/09/2016  . Chronic back pain secondary to DJD of cervical,  thoracic and lumbar spine and disc herniation 01/09/2016  . Osteoarthritis of both knees 11/02/2011  . FOOT PAIN, BILATERAL 09/20/2009  . DISORDER, DEPRESSIVE NEC 09/25/2006  . HERPES LABIALIS 04/04/2006  . Chronic hepatitis C without hepatic coma ([facility]) 02/22/2006  . Essential hypertension 02/22/2006    Caprice RedChasse, Neal Trulson M  PT 03/10/2019, 5:26 PM  [facility] [address] [place], [place], [address] Phone: [phone]   Fax:  [phone]  Name: Lorriane Shireerry L Buzzell MRN: [id number] Date of Birth: 1955/10/22

## 2019-03-19 ENCOUNTER — Other Ambulatory Visit: Payer: Self-pay

## 2019-03-19 ENCOUNTER — Encounter: Payer: Self-pay | Admitting: Physical Therapy

## 2019-03-19 ENCOUNTER — Ambulatory Visit: Payer: Federal, State, Local not specified - PPO | Attending: Orthopaedic Surgery | Admitting: Physical Therapy

## 2019-03-19 ENCOUNTER — Encounter: Payer: Self-pay | Admitting: Internal Medicine

## 2019-03-19 DIAGNOSIS — R262 Difficulty in walking, not elsewhere classified: Secondary | ICD-10-CM | POA: Diagnosis present

## 2019-03-19 DIAGNOSIS — M6281 Muscle weakness (generalized): Secondary | ICD-10-CM | POA: Diagnosis present

## 2019-03-19 DIAGNOSIS — M25552 Pain in left hip: Secondary | ICD-10-CM

## 2019-03-19 DIAGNOSIS — M25652 Stiffness of left hip, not elsewhere classified: Secondary | ICD-10-CM | POA: Diagnosis not present

## 2019-03-19 NOTE — Therapy (Signed)
[facility] [address] [place], [place], [address] Phone: [phone]   Fax:  [phone]  Physical Therapy Treatment  Patient Details  Name: Greg Flowers MRN: [id number] Date of Birth: Sep 05, 1955 Referring Provider (PT): Marcene Corning   Encounter Date: 03/19/2019  PT End of Session - 03/19/19 1232    Visit Number  20    Number of Visits  25    Date for PT Re-Evaluation  04/10/19    Authorization Type  BCBS federal    PT Start Time  1230    PT Stop Time  1315    PT Time Calculation (min)  45 min       Past Medical History:  Diagnosis Date  . Anxiety   . Calculus, kidney 2000   Sees Dr. Isabel Caprice, urology.   . Cervical spondylosis 2004   sp surgery by Dr. Danielle Dess  . Depression   . GERD (gastroesophageal reflux disease)   . Headache(784.0)    Chronic, on vicodin 5 TID for this.   . Hepatitis C    Has occasional visits with Dr. Randa Evens GI, failed RX in 2000.  Liver Biopsy 9/06: Minimally active hepatitis consistent with hepatitis C, minimal necroinflamtory activity grade 1, no incrased fibrosis stage 0.   . Herpes labialis   . History of kidney stones   . Hypertension   . Pneumonia   . Renal stone   . Spondylolisthesis of lumbar region   . Thalassemia    HGB 9/09 14.4 wtih MCV 72.6  . Wears glasses     Past Surgical History:  Procedure Laterality Date  . ANTERIOR CERVICAL DECOMP/DISCECTOMY FUSION N/A 11/21/2017   Procedure: ANTERIOR CERVICAL DECOMPRESSION FUSION, CERVICAL THREE-FOUR, CERVICAL FOUR-FIVE WITH INSTRUMENTATION AND ALLOGRAFT;  Surgeon: Estill Bamberg, MD;  [facility];  Service: Orthopedics;  Laterality: N/A;  ANTERIOR CERVICAL DECOMPRESSION FUSION, CERVICAL THREE-FOUR, CERVICAL FOUR-FIVE WITH INSTRUMENTATION AND ALLOGRAFT  . BACK SURGERY  2017   L4-L5 PLATE/SCREWS  . CERVICAL DISCECTOMY  2004   Dr Danielle Dess  . COLONOSCOPY    . LITHOTRIPSY  2004   Dr Logan Bores  . NASAL SEPTUM SURGERY  2007  .  TONSILLECTOMY    . TOTAL HIP ARTHROPLASTY Left 11/18/2018   Procedure: Left Anterior Hip Arthroplasty;  Surgeon: Marcene Corning, MD;  [facility];  Service: Orthopedics;  Laterality: Left;    There were no vitals filed for this visit.  Subjective Assessment - 03/19/19 1244    Currently in Pain?  Yes    Pain Score  2     Pain Location  Hip    Pain Orientation  Left;Lateral    Pain Descriptors / Indicators  Aching    Aggravating Factors   activity on feet    Pain Relieving Factors  rest, meds                       [facility] Adult PT Treatment/Exercise - 03/19/19 0001      Knee/Hip Exercises: Stretches   Active Hamstring Stretch  3 reps;30 seconds    Active Hamstring Stretch Limitations  strap    Hip Flexor Stretch  Left;3 reps;30 seconds    Gastroc Stretch  Both;2 reps;60 seconds      Knee/Hip Exercises: Standing   Heel Raises  20 reps    Other Standing Knee Exercises  sink squats , tandem stance trials , SLS 6-8 sec best     Other Standing Knee Exercises  Marching, hip abduction  and hi extension x 20 each bilateral       Knee/Hip Exercises: Seated   Long Arc Quad  20 reps    Long Arc Quad Limitations  green band       Knee/Hip Exercises: Supine   Bridges  20 reps    Other Supine Knee/Hip Exercises  supine clam green band x 20       Knee/Hip Exercises: Sidelying   Clams  green x 20    Other Sidelying Knee/Hip Exercises  reverse AROM x 20                  PT Long Term Goals - 03/10/19 1415      PT LONG TERM GOAL #1   Title  Pt will be I and compliant with HEP. (Target goal for all goals 6 weeks 01/15/19)    Baseline  2-3x/week    Time  4    Period  Weeks    Status  On-going      PT LONG TERM GOAL #2   Title  [facility] to [facility]    Baseline  50% ROM available post op    Status  On-going      PT LONG TERM GOAL #3   Title  He will report pain decreased 50% or more  in LT hip with normal activity including  yardwork    Baseline  2-3/10   now but was a 5/10 before injection.    Status  On-going      PT LONG TERM GOAL #5   Title  He will improve 5TSTS test to 12 seconds or less    Baseline  14 sec on first trial,  11 on sec trial    Status  Achieved            Plan - 03/19/19 1311    Clinical Impression Statement  Greg Flowers arrives with low level hip pain. Reviewed HEP in his chart. He tolerated all therex well. He wants to retrun to the gym.    PT Next Visit Plan  Cont closed chain resisted exercises and balance  , conditioning.  Finish HEP (pt to bring his handouts next session)  go over appropriate gym machines.    PT Home Exercise Plan  standing march and hip abd, hamstring stretch, sit to stands, heel toe raises, LAQ,     mat with band hip clam and abduction and clam with bridge  band  added for side hip clam and abduciton , side steps , hip IR , clammnd bridge.  tandem standing and exagerated DF in standing for balance       Patient will benefit from skilled therapeutic intervention in order to improve the following deficits and impairments:  Abnormal gait, Decreased activity tolerance, Decreased balance, Decreased mobility, Decreased range of motion, Decreased strength, Difficulty walking, Impaired flexibility, Pain  Visit Diagnosis: Stiffness of left hip, not elsewhere classified  Muscle weakness (generalized)  Difficulty in walking, not elsewhere classified  Pain in left hip     Problem List Patient Active Problem List   Diagnosis Date Noted  . Primary localized osteoarthritis of left hip 11/18/2018  . Primary osteoarthritis of left hip 11/18/2018  . Left hip pain secondary to OA  08/13/2018  . Microcytic anemia 08/13/2018  . Urinary retention 11/08/2017  . Liver fibrosis 04/30/2017  . NSAID long-term use 01/09/2016  . Chronic back pain secondary to DJD of cervical, thoracic and lumbar spine and disc  herniation 01/09/2016  . Osteoarthritis of both knees 11/02/2011  .  FOOT PAIN, BILATERAL 09/20/2009  . DISORDER, DEPRESSIVE NEC 09/25/2006  . HERPES LABIALIS 04/04/2006  . Chronic hepatitis C without hepatic coma ([place]) 02/22/2006  . Essential hypertension 02/22/2006    Dorene Ar, PTA 03/19/2019, 1:26 PM  [facility] [address] [place], [place], [address] Phone: [phone]   Fax:  [phone]  Name: Greg Flowers MRN: [id number] Date of Birth: 12-Oct-1955

## 2019-03-24 ENCOUNTER — Other Ambulatory Visit: Payer: Self-pay

## 2019-03-24 ENCOUNTER — Ambulatory Visit: Payer: Federal, State, Local not specified - PPO

## 2019-03-24 DIAGNOSIS — R262 Difficulty in walking, not elsewhere classified: Secondary | ICD-10-CM

## 2019-03-24 DIAGNOSIS — M6281 Muscle weakness (generalized): Secondary | ICD-10-CM

## 2019-03-24 DIAGNOSIS — M25652 Stiffness of left hip, not elsewhere classified: Secondary | ICD-10-CM

## 2019-03-24 DIAGNOSIS — M25552 Pain in left hip: Secondary | ICD-10-CM

## 2019-03-24 NOTE — Therapy (Signed)
[facility] [address] [place], [place], [address] Phone: [phone]   Fax:  [phone]  Physical Therapy Treatment  Patient Details  Name: Greg Flowers MRN: [id number] Date of Birth: 1955-04-30 Referring Provider (PT): Marcene Corningalldorf, Peter   Encounter Date: 03/24/2019  PT End of Session - 03/24/19 1536    Visit Number  21    Number of Visits  25    Date for PT Re-Evaluation  04/10/19    Authorization Type  BCBS federal    PT Start Time  0335    PT Stop Time  0415    PT Time Calculation (min)  40 min    Activity Tolerance  Patient tolerated treatment well    Behavior During Therapy  [facility] for tasks assessed/performed       Past Medical History:  Diagnosis Date  . Anxiety   . Calculus, kidney 2000   Sees Dr. Isabel CapriceGrapey, urology.   . Cervical spondylosis 2004   sp surgery by Dr. Danielle DessElsner  . Depression   . GERD (gastroesophageal reflux disease)   . Headache(784.0)    Chronic, on vicodin 5 TID for this.   . Hepatitis C    Has occasional visits with Dr. Randa EvensEdwards GI, failed RX in 2000.  Liver Biopsy 9/06: Minimally active hepatitis consistent with hepatitis C, minimal necroinflamtory activity grade 1, no incrased fibrosis stage 0.   . Herpes labialis   . History of kidney stones   . Hypertension   . Pneumonia   . Renal stone   . Spondylolisthesis of lumbar region   . Thalassemia    HGB 9/09 14.4 wtih MCV 72.6  . Wears glasses     Past Surgical History:  Procedure Laterality Date  . ANTERIOR CERVICAL DECOMP/DISCECTOMY FUSION N/A 11/21/2017   Procedure: ANTERIOR CERVICAL DECOMPRESSION FUSION, CERVICAL THREE-FOUR, CERVICAL FOUR-FIVE WITH INSTRUMENTATION AND ALLOGRAFT;  Surgeon: Estill Bambergumonski, Mark, MD;  Location: [facility] OR;  Service: Orthopedics;  Laterality: N/A;  ANTERIOR CERVICAL DECOMPRESSION FUSION, CERVICAL THREE-FOUR, CERVICAL FOUR-FIVE WITH INSTRUMENTATION AND ALLOGRAFT  . BACK SURGERY  2017   L4-L5 PLATE/SCREWS  . CERVICAL  DISCECTOMY  2004   Dr Danielle DessElsner  . COLONOSCOPY    . LITHOTRIPSY  2004   Dr Logan BoresEvans  . NASAL SEPTUM SURGERY  2007  . TONSILLECTOMY    . TOTAL HIP ARTHROPLASTY Left 11/18/2018   Procedure: Left Anterior Hip Arthroplasty;  Surgeon: Marcene Corningalldorf, Peter, MD;  Location: [facility];  Service: Orthopedics;  Laterality: Left;    There were no vitals filed for this visit.  Subjective Assessment - 03/24/19 1712    Subjective  Still sore in hip . Otherwise doing fine . Still not comfortable totally with my walking    Pain Score  2     Pain Location  Hip    Pain Orientation  Left;Lateral    Pain Descriptors / Indicators  Aching    Pain Type  Surgical pain    Pain Onset  More than a month ago    Pain Frequency  Intermittent    Aggravating Factors   weight bearing    Pain Relieving Factors  rest , meds                       [facility] Adult PT Treatment/Exercise - 03/24/19 0001      Knee/Hip Exercises: Stretches   Active Hamstring Stretch  3 reps;30 seconds    Active Hamstring Stretch Limitations  seated    Hip Flexor  Stretch  Left;3 reps;30 seconds      Knee/Hip Exercises: Aerobic   Nustep  L8 6 min LE      Knee/Hip Exercises: Standing   Other Standing Knee Exercises  sink squats , tandem stance trials , SLS 6-8 sec best     Other Standing Knee Exercises  Marching, hip abduction and hi extension x 20 each bilateral       Knee/Hip Exercises: Seated   Long Arc Quad  --    Long Arc Quad Weight  8 lbs.    Long [employer] Limitations  30 reps LT/.       Knee/Hip Exercises: Supine   Bridges  20 reps    Single Leg Bridge  Left;15 reps    Other Supine Knee/Hip Exercises  supine clam green band x 25      Knee/Hip Exercises: Sidelying   Hip ABduction  Left;20 reps    Hip ABduction Limitations  green band    Clams  green x 25    Other Sidelying Knee/Hip Exercises  reverse AROM x 25                  PT Long Term Goals - 03/10/19 1415      PT LONG TERM GOAL #1   Title  Pt will  be I and compliant with HEP. (Target goal for all goals 6 weeks 01/15/19)    Baseline  2-3x/week    Time  4    Period  Weeks    Status  On-going      PT LONG TERM GOAL #2   Title  Pt will improve ROM to [facility] to improve mobility    Baseline  50% ROM available post op    Status  On-going      PT LONG TERM GOAL #3   Title  He will report pain decreased 50% or more  in LT hip with normal activity including yardwork    Baseline  2-3/10   now but was a 5/10 before injection.    Status  On-going      PT LONG TERM GOAL #5   Title  He will improve 5TSTS test to 12 seconds or less    Baseline  14 sec on first trial,  11 on sec trial    Status  Achieved              Patient will benefit from skilled therapeutic intervention in order to improve the following deficits and impairments:     Visit Diagnosis: Muscle weakness (generalized)  Stiffness of left hip, not elsewhere classified  Difficulty in walking, not elsewhere classified  Pain in left hip     Problem List Patient Active Problem List   Diagnosis Date Noted  . Primary localized osteoarthritis of left hip 11/18/2018  . Primary osteoarthritis of left hip 11/18/2018  . Left hip pain secondary to OA  08/13/2018  . Microcytic anemia 08/13/2018  . Urinary retention 11/08/2017  . Liver fibrosis 04/30/2017  . NSAID long-term use 01/09/2016  . Chronic back pain secondary to DJD of cervical, thoracic and lumbar spine and disc herniation 01/09/2016  . Osteoarthritis of both knees 11/02/2011  . FOOT PAIN, BILATERAL 09/20/2009  . DISORDER, DEPRESSIVE NEC 09/25/2006  . HERPES LABIALIS 04/04/2006  . Chronic hepatitis C without hepatic coma ([place]) 02/22/2006  . Essential hypertension 02/22/2006    [name]  PT 03/24/2019, 5:13 PM  [facility]  [place], [place], [address] Phone: [phone]   Fax:  [phone]  Name: Greg Flowers MRN:  [id number] Date of Birth: 12-04-1955

## 2019-03-26 ENCOUNTER — Ambulatory Visit: Payer: Federal, State, Local not specified - PPO

## 2019-03-26 ENCOUNTER — Other Ambulatory Visit: Payer: Self-pay | Admitting: Internal Medicine

## 2019-03-26 ENCOUNTER — Other Ambulatory Visit: Payer: Self-pay

## 2019-03-26 DIAGNOSIS — R262 Difficulty in walking, not elsewhere classified: Secondary | ICD-10-CM

## 2019-03-26 DIAGNOSIS — M25552 Pain in left hip: Secondary | ICD-10-CM

## 2019-03-26 DIAGNOSIS — M25652 Stiffness of left hip, not elsewhere classified: Secondary | ICD-10-CM | POA: Diagnosis not present

## 2019-03-26 DIAGNOSIS — M6281 Muscle weakness (generalized): Secondary | ICD-10-CM

## 2019-03-26 MED ORDER — GABAPENTIN 600 MG PO TABS: 600.0000 mg | ORAL_TABLET | Freq: Two times a day (BID) | ORAL | 0 refills | Status: DC

## 2019-03-26 NOTE — Therapy (Signed)
[facility] [address] [place], [place], [address] Phone: [phone]   Fax:  [phone]  Physical Therapy Treatment  Patient Details  Name: Greg Flowers MRN: [id number] Date of Birth: 1955-07-15 Referring Provider (PT): Marcene Corningalldorf, Peter   Encounter Date: 03/26/2019  PT End of Session - 03/26/19 1534    Visit Number  22    Number of Visits  25    Date for PT Re-Evaluation  04/10/19    Authorization Type  BCBS federal    PT Start Time  0330    PT Stop Time  0415    PT Time Calculation (min)  45 min    Activity Tolerance  Patient tolerated treatment well    Behavior During Therapy  [facility] for tasks assessed/performed       Past Medical History:  Diagnosis Date  . Anxiety   . Calculus, kidney 2000   Sees Dr. Isabel CapriceGrapey, urology.   . Cervical spondylosis 2004   sp surgery by Dr. Danielle DessElsner  . Depression   . GERD (gastroesophageal reflux disease)   . Headache(784.0)    Chronic, on vicodin 5 TID for this.   . Hepatitis C    Has occasional visits with Dr. Randa EvensEdwards GI, failed RX in 2000.  Liver Biopsy 9/06: Minimally active hepatitis consistent with hepatitis C, minimal necroinflamtory activity grade 1, no incrased fibrosis stage 0.   . Herpes labialis   . History of kidney stones   . Hypertension   . Pneumonia   . Renal stone   . Spondylolisthesis of lumbar region   . Thalassemia    HGB 9/09 14.4 wtih MCV 72.6  . Wears glasses     Past Surgical History:  Procedure Laterality Date  . ANTERIOR CERVICAL DECOMP/DISCECTOMY FUSION N/A 11/21/2017   Procedure: ANTERIOR CERVICAL DECOMPRESSION FUSION, CERVICAL THREE-FOUR, CERVICAL FOUR-FIVE WITH INSTRUMENTATION AND ALLOGRAFT;  Surgeon: Estill Bambergumonski, Mark, MD;  Location: [facility] OR;  Service: Orthopedics;  Laterality: N/A;  ANTERIOR CERVICAL DECOMPRESSION FUSION, CERVICAL THREE-FOUR, CERVICAL FOUR-FIVE WITH INSTRUMENTATION AND ALLOGRAFT  . BACK SURGERY  2017   L4-L5 PLATE/SCREWS  . CERVICAL  DISCECTOMY  2004   Dr Danielle DessElsner  . COLONOSCOPY    . LITHOTRIPSY  2004   Dr Logan BoresEvans  . NASAL SEPTUM SURGERY  2007  . TONSILLECTOMY    . TOTAL HIP ARTHROPLASTY Left 11/18/2018   Procedure: Left Anterior Hip Arthroplasty;  Surgeon: Marcene Corningalldorf, Peter, MD;  Location: [facility];  Service: Orthopedics;  Laterality: Left;    There were no vitals filed for this visit.                    [facility] Adult PT Treatment/Exercise - 03/26/19 0001      Neuro Re-ed    Neuro Re-ed Details   single leg standing practive RT and LT       Knee/Hip Exercises: Stretches   Active Hamstring Stretch  Right;Left;2 reps;30 seconds    Active Hamstring Stretch Limitations  seated    Hip Flexor Stretch  Left;3 reps;30 seconds    Hip Flexor Stretch Limitations  standing    Gastroc Stretch  Both;2 reps;60 seconds    Gastroc Stretch Limitations  slant board      Knee/Hip Exercises: Aerobic   Nustep  L8 6 min LE      Knee/Hip Exercises: Standing   Heel Raises  20 reps    Hip Flexion  Right;Left;20 reps    Hip Flexion Limitations  7#    Hip  Abduction  Right;Left;20 reps    Abduction Limitations  7#  final 10 reps done with decr ROM and 5 sec hold     Hip Extension  Right;Left;20 reps    Extension Limitations  7#    then no weight x 12 reps lesning on counter  with assist to full ROM . reportd some back discomfort when asked so we stretched forwad over knees  after this    Lateral Step Up  Left;15 reps;Hand Hold: 1;Step Height: 8"    Other Standing Knee Exercises  sink squat x 20    Other Standing Knee Exercises  lateral pelvic tilts x 15 RT and LT from 2 inch platform      Knee/Hip Exercises: Seated   Long Arc Quad  Left    Long Arc Quad Weight  8 lbs.    Long [employer] Limitations  30 reps LT/.                   PT Long Term Goals - 03/10/19 1415      PT LONG TERM GOAL #1   Title  Pt will be I and compliant with HEP. (Target goal for all goals 6 weeks 01/15/19)    Baseline  2-3x/week     Time  4    Period  Weeks    Status  On-going      PT LONG TERM GOAL #2   Title  Pt will improve ROM to [facility] to improve mobility    Baseline  50% ROM available post op    Status  On-going      PT LONG TERM GOAL #3   Title  He will report pain decreased 50% or more  in LT hip with normal activity including yardwork    Baseline  2-3/10   now but was a 5/10 before injection.    Status  On-going      PT LONG TERM GOAL #5   Title  He will improve 5TSTS test to 12 seconds or less    Baseline  14 sec on first trial,  11 on sec trial    Status  Achieved            Plan - 03/26/19 1534    Clinical Impression Statement  Tolerated exercises without complaint except some back discomfort with hip extension.   continue to push hip strength    PT Treatment/Interventions  Aquatic Therapy;Electrical Stimulation;Cryotherapy;Iontophoresis 4mg /ml Dexamethasone;Moist Heat;Ultrasound;Gait training;Stair training;Therapeutic activities;Therapeutic exercise;Balance training;Neuromuscular re-education;Passive range of motion;Dry needling;Joint Manipulations    PT Next Visit Plan  Cont closed chain resisted exercises and balance  , conditioning.  Finish HEP (pt to bring his handouts next session)  go over appropriate gym machines.    PT Home Exercise Plan  standing march and hip abd, hamstring stretch, sit to stands, heel toe raises, LAQ,     mat with band hip clam and abduction and clam with bridge  band  added for side hip clam and abduciton , side steps , hip IR , clammnd bridge.  tandem standing and exagerated DF in standing for balance    Consulted and Agree with Plan of Care  Patient       Patient will benefit from skilled therapeutic intervention in order to improve the following deficits and impairments:  Abnormal gait, Decreased activity tolerance, Decreased balance, Decreased mobility, Decreased range of motion, Decreased strength, Difficulty walking, Impaired flexibility, Pain  Visit  Diagnosis: Difficulty in walking, not elsewhere classified  Muscle  weakness (generalized)  Pain in left hip     Problem List Patient Active Problem List   Diagnosis Date Noted  . Primary localized osteoarthritis of left hip 11/18/2018  . Primary osteoarthritis of left hip 11/18/2018  . Left hip pain secondary to OA  08/13/2018  . Microcytic anemia 08/13/2018  . Urinary retention 11/08/2017  . Liver fibrosis 04/30/2017  . NSAID long-term use 01/09/2016  . Chronic back pain secondary to DJD of cervical, thoracic and lumbar spine and disc herniation 01/09/2016  . Osteoarthritis of both knees 11/02/2011  . FOOT PAIN, BILATERAL 09/20/2009  . DISORDER, DEPRESSIVE NEC 09/25/2006  . HERPES LABIALIS 04/04/2006  . Chronic hepatitis C without hepatic coma ([place]) 02/22/2006  . Essential hypertension 02/22/2006    [name]  PT 03/26/2019, 5:18 PM  [facility] [address] [place], [place], [address] Phone: [phone]   Fax:  [phone]  Name: DEMARQUIS OSLEY MRN: [id number] Date of Birth: 06-04-1955

## 2019-03-31 ENCOUNTER — Ambulatory Visit: Payer: Federal, State, Local not specified - PPO

## 2019-03-31 ENCOUNTER — Other Ambulatory Visit: Payer: Self-pay

## 2019-03-31 ENCOUNTER — Other Ambulatory Visit: Payer: Self-pay | Admitting: Internal Medicine

## 2019-03-31 DIAGNOSIS — M6281 Muscle weakness (generalized): Secondary | ICD-10-CM

## 2019-03-31 DIAGNOSIS — M25652 Stiffness of left hip, not elsewhere classified: Secondary | ICD-10-CM | POA: Diagnosis not present

## 2019-03-31 DIAGNOSIS — M25552 Pain in left hip: Secondary | ICD-10-CM

## 2019-03-31 DIAGNOSIS — R262 Difficulty in walking, not elsewhere classified: Secondary | ICD-10-CM

## 2019-03-31 NOTE — Therapy (Signed)
[facility] [address] [place], [place], [address] Phone: [phone]   Fax:  [phone]  Physical Therapy Treatment  Patient Details  Name: Greg Flowers MRN: [id number] Date of Birth: 03-Mar-1956 Referring Provider (PT): Marcene Corning   Encounter Date: 03/31/2019  PT End of Session - 03/31/19 1540    Visit Number  23    Number of Visits  25    Date for PT Re-Evaluation  04/10/19    Authorization Type  BCBS federal    PT Start Time  0330    PT Stop Time  0415    PT Time Calculation (min)  45 min    Activity Tolerance  Patient tolerated treatment well    Behavior During Therapy  [facility] for tasks assessed/performed       Past Medical History:  Diagnosis Date  . Anxiety   . Calculus, kidney 2000   Sees Dr. Isabel Caprice, urology.   . Cervical spondylosis 2004   sp surgery by Dr. Danielle Dess  . Depression   . GERD (gastroesophageal reflux disease)   . Headache(784.0)    Chronic, on vicodin 5 TID for this.   . Hepatitis C    Has occasional visits with Dr. Randa Evens GI, failed RX in 2000.  Liver Biopsy 9/06: Minimally active hepatitis consistent with hepatitis C, minimal necroinflamtory activity grade 1, no incrased fibrosis stage 0.   . Herpes labialis   . History of kidney stones   . Hypertension   . Pneumonia   . Renal stone   . Spondylolisthesis of lumbar region   . Thalassemia    HGB 9/09 14.4 wtih MCV 72.6  . Wears glasses     Past Surgical History:  Procedure Laterality Date  . ANTERIOR CERVICAL DECOMP/DISCECTOMY FUSION N/A 11/21/2017   Procedure: ANTERIOR CERVICAL DECOMPRESSION FUSION, CERVICAL THREE-FOUR, CERVICAL FOUR-FIVE WITH INSTRUMENTATION AND ALLOGRAFT;  Surgeon: Estill Bamberg, MD;  Location: [facility];  Service: Orthopedics;  Laterality: N/A;  ANTERIOR CERVICAL DECOMPRESSION FUSION, CERVICAL THREE-FOUR, CERVICAL FOUR-FIVE WITH INSTRUMENTATION AND ALLOGRAFT  . BACK SURGERY  2017   L4-L5 PLATE/SCREWS  . CERVICAL  DISCECTOMY  2004   Dr Danielle Dess  . COLONOSCOPY    . LITHOTRIPSY  2004   Dr Logan Bores  . NASAL SEPTUM SURGERY  2007  . TONSILLECTOMY    . TOTAL HIP ARTHROPLASTY Left 11/18/2018   Procedure: Left Anterior Hip Arthroplasty;  Surgeon: Marcene Corning, MD;  Location: [facility];  Service: Orthopedics;  Laterality: Left;    There were no vitals filed for this visit.  Subjective Assessment - 03/31/19 1538    Subjective  Hip Lt a little sore.    Pain Score  2     Pain Location  Hip    Pain Orientation  Left;Lateral    Pain Descriptors / Indicators  Aching    Pain Type  Surgical pain    Pain Onset  More than a month ago    Pain Frequency  Intermittent    Aggravating Factors   weight bearing    Pain Relieving Factors  rest meds                       [facility] Adult PT Treatment/Exercise - 03/31/19 0001      Knee/Hip Exercises: Aerobic   Nustep  L6 7 min LE      Knee/Hip Exercises: Standing   Hip Flexion  Right;Left;20 reps    Hip Flexion Limitations  8# on LT  Hip Abduction  Right;Left;20 reps    Abduction Limitations  8# on LT     Hip Extension  Right;Left;20 reps    Extension Limitations  8#  on LT     Lateral Step Up  Left;15 reps;Hand Hold: 1;Step Height: 8"    Other Standing Knee Exercises  sink squat x 25    Other Standing Knee Exercises  lateral pelvic tilts x 15 RT and LT from 2 inch platform      Knee/Hip Exercises: Seated   Long Arc Quad  Left    Long Arc Quad Weight  8 lbs.    Long [employer] Limitations  30 reps LT/.     Clamshell with TheraBand  --   black x 15 10 sec hold   Sit to Sand  10 reps;without UE support   with black band  clam                 PT Long Term Goals - 03/10/19 1415      PT LONG TERM GOAL #1   Title  Pt will be I and compliant with HEP. (Target goal for all goals 6 weeks 01/15/19)    Baseline  2-3x/week    Time  4    Period  Weeks    Status  On-going      PT LONG TERM GOAL #2   Title  Pt will improve ROM to [facility] to  improve mobility    Baseline  50% ROM available post op    Status  On-going      PT LONG TERM GOAL #3   Title  He will report pain decreased 50% or more  in LT hip with normal activity including yardwork    Baseline  2-3/10   now but was a 5/10 before injection.    Status  On-going      PT LONG TERM GOAL #5   Title  He will improve 5TSTS test to 12 seconds or less    Baseline  14 sec on first trial,  11 on sec trial    Status  Achieved            Plan - 03/31/19 1541    Clinical Impression Statement  No complaints today with strengthening program.  Will finish to end of plan then discharge    PT Treatment/Interventions  Aquatic Therapy;Electrical Stimulation;Cryotherapy;Iontophoresis 4mg /ml Dexamethasone;Moist Heat;Ultrasound;Gait training;Stair training;Therapeutic activities;Therapeutic exercise;Balance training;Neuromuscular re-education;Passive range of motion;Dry needling;Joint Manipulations    PT Next Visit Plan  Cont closed chain resisted exercises and balance  , conditioning.  Finish HEP  add lateral pelvic lift  for HEP    PT Home Exercise Plan  standing march and hip abd, hamstring stretch, sit to stands, heel toe raises, LAQ,     mat with band hip clam and abduction and clam with bridge  band  added for side hip clam and abduciton , side steps , hip IR , clammnd bridge.  tandem standing and exagerated DF in standing for balance    Consulted and Agree with Plan of Care  Patient       Patient will benefit from skilled therapeutic intervention in order to improve the following deficits and impairments:  Abnormal gait, Decreased activity tolerance, Decreased balance, Decreased mobility, Decreased range of motion, Decreased strength, Difficulty walking, Impaired flexibility, Pain  Visit Diagnosis: Muscle weakness (generalized)  Pain in left hip  Difficulty in walking, not elsewhere classified  Stiffness of left hip, not elsewhere classified  Problem List Patient  Active Problem List   Diagnosis Date Noted  . Primary localized osteoarthritis of left hip 11/18/2018  . Primary osteoarthritis of left hip 11/18/2018  . Left hip pain secondary to OA  08/13/2018  . Microcytic anemia 08/13/2018  . Urinary retention 11/08/2017  . Liver fibrosis 04/30/2017  . NSAID long-term use 01/09/2016  . Chronic back pain secondary to DJD of cervical, thoracic and lumbar spine and disc herniation 01/09/2016  . Osteoarthritis of both knees 11/02/2011  . FOOT PAIN, BILATERAL 09/20/2009  . DISORDER, DEPRESSIVE NEC 09/25/2006  . HERPES LABIALIS 04/04/2006  . Chronic hepatitis C without hepatic coma ([facility]) 02/22/2006  . Essential hypertension 02/22/2006    Caprice RedChasse, Mccartney Brucks M  PT 03/31/2019, 5:12 PM  [facility] [address] [place], [place], [address] Phone: [phone]   Fax:  [phone]  Name: Lorriane Shireerry L Glascoe MRN: [id number] Date of Birth: 1955-05-20

## 2019-04-02 ENCOUNTER — Ambulatory Visit: Payer: Federal, State, Local not specified - PPO

## 2019-04-02 ENCOUNTER — Other Ambulatory Visit: Payer: Self-pay

## 2019-04-02 DIAGNOSIS — M25652 Stiffness of left hip, not elsewhere classified: Secondary | ICD-10-CM | POA: Diagnosis not present

## 2019-04-02 DIAGNOSIS — M25552 Pain in left hip: Secondary | ICD-10-CM

## 2019-04-02 DIAGNOSIS — M6281 Muscle weakness (generalized): Secondary | ICD-10-CM

## 2019-04-02 NOTE — Therapy (Signed)
[facility] [address] [place], [place], [address] Phone: [phone]   Fax:  [phone]  Physical Therapy Treatment  Patient Details  Name: Greg Flowers MRN: [id number] Date of Birth: 02/18/1956 Referring Provider (PT): Marcene Corningalldorf, Peter   Encounter Date: 04/02/2019  PT End of Session - 04/02/19 1536    Visit Number  24    Number of Visits  27    Date for PT Re-Evaluation  04/16/19    Authorization Type  BCBS federal    PT Start Time  0330    PT Stop Time  0415    PT Time Calculation (min)  45 min    Activity Tolerance  Patient tolerated treatment well    Behavior During Therapy  [facility] for tasks assessed/performed       Past Medical History:  Diagnosis Date  . Anxiety   . Calculus, kidney 2000   Sees Dr. Isabel CapriceGrapey, urology.   . Cervical spondylosis 2004   sp surgery by Dr. Danielle DessElsner  . Depression   . GERD (gastroesophageal reflux disease)   . Headache(784.0)    Chronic, on vicodin 5 TID for this.   . Hepatitis C    Has occasional visits with Dr. Randa EvensEdwards GI, failed RX in 2000.  Liver Biopsy 9/06: Minimally active hepatitis consistent with hepatitis C, minimal necroinflamtory activity grade 1, no incrased fibrosis stage 0.   . Herpes labialis   . History of kidney stones   . Hypertension   . Pneumonia   . Renal stone   . Spondylolisthesis of lumbar region   . Thalassemia    HGB 9/09 14.4 wtih MCV 72.6  . Wears glasses     Past Surgical History:  Procedure Laterality Date  . ANTERIOR CERVICAL DECOMP/DISCECTOMY FUSION N/A 11/21/2017   Procedure: ANTERIOR CERVICAL DECOMPRESSION FUSION, CERVICAL THREE-FOUR, CERVICAL FOUR-FIVE WITH INSTRUMENTATION AND ALLOGRAFT;  Surgeon: Estill Bambergumonski, Mark, MD;  Location: [facility];  Service: Orthopedics;  Laterality: N/A;  ANTERIOR CERVICAL DECOMPRESSION FUSION, CERVICAL THREE-FOUR, CERVICAL FOUR-FIVE WITH INSTRUMENTATION AND ALLOGRAFT  . BACK SURGERY  2017   L4-L5 PLATE/SCREWS  . CERVICAL  DISCECTOMY  2004   Dr Danielle DessElsner  . COLONOSCOPY    . LITHOTRIPSY  2004   Dr Logan BoresEvans  . NASAL SEPTUM SURGERY  2007  . TONSILLECTOMY    . TOTAL HIP ARTHROPLASTY Left 11/18/2018   Procedure: Left Anterior Hip Arthroplasty;  Surgeon: Marcene Corningalldorf, Peter, MD;  Location: [facility];  Service: Orthopedics;  Laterality: Left;    There were no vitals filed for this visit.  Subjective Assessment - 04/02/19 1538    Subjective  1/10 pain LT hip    Pain Score  1     Pain Location  Hip    Pain Orientation  Left    Pain Descriptors / Indicators  Aching    Pain Type  Chronic pain;Surgical pain    Pain Onset  More than a month ago    Pain Frequency  Intermittent    Aggravating Factors   weight bearing    Pain Relieving Factors  rest   meds         [facility] PT Assessment - 04/02/19 0001      Observation/Other Assessments   Focus on Therapeutic Outcomes (FOTO)   59% limited                   [facility] Adult PT Treatment/Exercise - 04/02/19 0001      Neuro Re-ed    Neuro Re-ed Details  single leg standing practice RT best 5 sec and LT best 21 sec       Knee/Hip Exercises: Aerobic   Nustep  L6  8 min LE      Knee/Hip Exercises: Standing   Hip Flexion  Right;Left;15 reps    Hip Flexion Limitations  10# on LT     Hip Abduction  Right;Left;20 reps    Abduction Limitations  10# on LT     Hip Extension  Right;Left;20 reps    Extension Limitations  10#  on LT     Lateral Step Up  Left;15 reps;Hand Hold: 2    Lateral Step Up Limitations  11 inche step    Other Standing Knee Exercises  sink squat x 25    Other Standing Knee Exercises  lateral stepping with blue band 50x RT and LT      Knee/Hip Exercises: Seated   Long Arc Quad  Left    Long Arc Quad Weight  10 lbs.    Long [employer] Limitations  30 reps LT/.     Sit to Sand  10 reps;without UE support   with blue band                  PT Long Term Goals - 04/02/19 1540      PT LONG TERM GOAL #1   Title  Pt will be I and compliant  with HEP. (Target goal for all goals 6 weeks 01/15/19)      PT LONG TERM GOAL #3   Title  He will report pain decreased 50% or more  in LT hip with normal activity including yardwork    Baseline  1-2 generally now    Status  Achieved            Plan - 04/02/19 1539    Clinical Impression Statement  No increased pain. His balance is better on the LT leg than on the RT.  contiue to end of month    PT Treatment/Interventions  Aquatic Therapy;Electrical Stimulation;Cryotherapy;Iontophoresis 4mg /ml Dexamethasone;Moist Heat;Ultrasound;Gait training;Stair training;Therapeutic activities;Therapeutic exercise;Balance training;Neuromuscular re-education;Passive range of motion;Dry needling;Joint Manipulations    PT Home Exercise Plan  standing march and hip abd, hamstring stretch, sit to stands, heel toe raises, LAQ,     mat with band hip clam and abduction and clam with bridge  band  added for side hip clam and abduciton , side steps , hip IR , clammnd bridge.  tandem standing and exagerated DF in standing for balance    Consulted and Agree with Plan of Care  Patient       Patient will benefit from skilled therapeutic intervention in order to improve the following deficits and impairments:  Abnormal gait, Decreased activity tolerance, Decreased balance, Decreased mobility, Decreased range of motion, Decreased strength, Difficulty walking, Impaired flexibility, Pain  Visit Diagnosis: Muscle weakness (generalized)  Pain in left hip     Problem List Patient Active Problem List   Diagnosis Date Noted  . Primary localized osteoarthritis of left hip 11/18/2018  . Primary osteoarthritis of left hip 11/18/2018  . Left hip pain secondary to OA  08/13/2018  . Microcytic anemia 08/13/2018  . Urinary retention 11/08/2017  . Liver fibrosis 04/30/2017  . NSAID long-term use 01/09/2016  . Chronic back pain secondary to DJD of cervical, thoracic and lumbar spine and disc herniation 01/09/2016  .  Osteoarthritis of both knees 11/02/2011  . FOOT PAIN, BILATERAL 09/20/2009  . DISORDER, DEPRESSIVE NEC 09/25/2006  . HERPES  LABIALIS 04/04/2006  . Chronic hepatitis C without hepatic coma (HCC) 02/22/2006  . Essential hypertension 02/22/2006    Caprice Red  PT 04/02/2019, 4:23 PM  [facility] [address] [place], [place], [address] Phone: [phone]   Fax:  [phone]  Name: Greg Flowers MRN: [id number] Date of Birth: 1955-09-16

## 2019-04-07 ENCOUNTER — Ambulatory Visit: Payer: Federal, State, Local not specified - PPO

## 2019-04-07 ENCOUNTER — Other Ambulatory Visit: Payer: Self-pay

## 2019-04-07 DIAGNOSIS — M6281 Muscle weakness (generalized): Secondary | ICD-10-CM

## 2019-04-07 DIAGNOSIS — M25652 Stiffness of left hip, not elsewhere classified: Secondary | ICD-10-CM

## 2019-04-07 DIAGNOSIS — R262 Difficulty in walking, not elsewhere classified: Secondary | ICD-10-CM

## 2019-04-07 DIAGNOSIS — M25552 Pain in left hip: Secondary | ICD-10-CM

## 2019-04-07 NOTE — Therapy (Signed)
[facility] [address] [place], [place], [address] Phone: [phone]   Fax:  [phone]  Physical Therapy Treatment  Patient Details  Name: Greg Flowers MRN: [id number] Date of Birth: [birth year] Referring Provider (PT): Marcene Corning   Encounter Date: 04/07/2019  PT End of Session - 04/07/19 1627    Visit Number  25    Number of Visits  27    Date for PT Re-Evaluation  04/16/19    Authorization Type  BCBS federal    PT Start Time  0330    PT Stop Time  0415    PT Time Calculation (min)  45 min    Activity Tolerance  Patient tolerated treatment well    Behavior During Therapy  [facility] for tasks assessed/performed       Past Medical History:  Diagnosis Date  . Anxiety   . Calculus, kidney 2000   Sees Dr. Isabel Caprice, urology.   . Cervical spondylosis 2004   sp surgery by Dr. Danielle Dess  . Depression   . GERD (gastroesophageal reflux disease)   . Headache(784.0)    Chronic, on vicodin 5 TID for this.   . Hepatitis C    Has occasional visits with Dr. Randa Evens GI, failed RX in 2000.  Liver Biopsy 9/06: Minimally active hepatitis consistent with hepatitis C, minimal necroinflamtory activity grade 1, no incrased fibrosis stage 0.   . Herpes labialis   . History of kidney stones   . Hypertension   . Pneumonia   . Renal stone   . Spondylolisthesis of lumbar region   . Thalassemia    HGB 9/09 14.4 wtih MCV 72.6  . Wears glasses     Past Surgical History:  Procedure Laterality Date  . ANTERIOR CERVICAL DECOMP/DISCECTOMY FUSION N/A 11/21/2017   Procedure: ANTERIOR CERVICAL DECOMPRESSION FUSION, CERVICAL THREE-FOUR, CERVICAL FOUR-FIVE WITH INSTRUMENTATION AND ALLOGRAFT;  Surgeon: Estill Bamberg, MD;  Location: [facility];  Service: Orthopedics;  Laterality: N/A;  ANTERIOR CERVICAL DECOMPRESSION FUSION, CERVICAL THREE-FOUR, CERVICAL FOUR-FIVE WITH INSTRUMENTATION AND ALLOGRAFT  . BACK SURGERY  2017   L4-L5 PLATE/SCREWS  . CERVICAL  DISCECTOMY  2004   Dr Danielle Dess  . COLONOSCOPY    . LITHOTRIPSY  2004   Dr Logan Bores  . NASAL SEPTUM SURGERY  2007  . TONSILLECTOMY    . TOTAL HIP ARTHROPLASTY Left 11/18/2018   Procedure: Left Anterior Hip Arthroplasty;  Surgeon: Marcene Corning, MD;  [facility];  Service: Orthopedics;  Laterality: Left;    There were no vitals filed for this visit.  Subjective Assessment - 04/07/19 1546    Subjective  1/10 Lt hip soreness. Doing well.                       [facility] Adult PT Treatment/Exercise - 04/07/19 0001      Knee/Hip Exercises: Aerobic   Nustep  L6  8 min LE      Knee/Hip Exercises: Machines for Strengthening   Cybex Leg Press  120# 3 x 10 reps      Knee/Hip Exercises: Standing   Hip Flexion  Right;Left;15 reps    Hip Flexion Limitations  10# on LT  with step up 8inch step    Hip Abduction  Right;Left;20 reps    Abduction Limitations  10# on LT     Hip Extension  Right;Left;20 reps    Extension Limitations  10#  on LT     Lateral Step Up  Left;15 reps;Hand Hold: 2  Lateral Step Up Limitations  11 inche step    Other Standing Knee Exercises  sink squat x 25      Knee/Hip Exercises: Seated   Long Arc Quad  Left    Long Arc Quad Weight  10 lbs.    Long [employer] Limitations  30 reps LT/.        Lateral stepping 55 feet RT and LT with blue band           PT Long Term Goals - 04/07/19 1628      PT LONG TERM GOAL #1   Title  Pt will be I and compliant with HEP.)    Baseline  2-3x/week    Status  Achieved      PT LONG TERM GOAL #2   Title  Pt will improve ROM to [facility] to improve mobility    Status  On-going      PT LONG TERM GOAL #3   Title  He will report pain decreased 50% or more  in LT hip with normal activity including yardwork    Baseline  1-2 generally now    Status  Achieved      PT LONG TERM GOAL #4   Title  Pt will improve Lt hip strength to at least 5-/5 MMT to improve function    Status  On-going            Plan -  04/07/19 1627    Clinical Impression Statement  Will finish withPT next week. He has had 4 months of PT and has shown improved strnegth and function.  He just needs to do his HEP with the bands and weight to maintain strength.    PT Treatment/Interventions  Aquatic Therapy;Electrical Stimulation;Cryotherapy;Iontophoresis 4mg /ml Dexamethasone;Moist Heat;Ultrasound;Gait training;Stair training;Therapeutic activities;Therapeutic exercise;Balance training;Neuromuscular re-education;Passive range of motion;Dry needling;Joint Manipulations    PT Next Visit Plan  Cont closed chain resisted exercises and balance  , conditioning.  Finish HEP  add lateral pelvic lift  for HEP    PT Home Exercise Plan  standing march and hip abd, hamstring stretch, sit to stands, heel toe raises, LAQ,     mat with band hip clam and abduction and clam with bridge  band  added for side hip clam and abduciton , side steps , hip IR , clammnd bridge.  tandem standing and exagerated DF in standing for balance    Consulted and Agree with Plan of Care  Patient       Patient will benefit from skilled therapeutic intervention in order to improve the following deficits and impairments:  Abnormal gait, Decreased activity tolerance, Decreased balance, Decreased mobility, Decreased range of motion, Decreased strength, Difficulty walking, Impaired flexibility, Pain  Visit Diagnosis: Muscle weakness (generalized)  Pain in left hip  Difficulty in walking, not elsewhere classified  Stiffness of left hip, not elsewhere classified     Problem List Patient Active Problem List   Diagnosis Date Noted  . Primary localized osteoarthritis of left hip 11/18/2018  . Primary osteoarthritis of left hip 11/18/2018  . Left hip pain secondary to OA  08/13/2018  . Microcytic anemia 08/13/2018  . Urinary retention 11/08/2017  . Liver fibrosis 04/30/2017  . NSAID long-term use 01/09/2016  . Chronic back pain secondary to DJD of cervical, thoracic  and lumbar spine and disc herniation 01/09/2016  . Osteoarthritis of both knees 11/02/2011  . FOOT PAIN, BILATERAL 09/20/2009  . DISORDER, DEPRESSIVE NEC 09/25/2006  . HERPES LABIALIS 04/04/2006  . Chronic hepatitis C  without hepatic coma (HCC) 02/22/2006  . Essential hypertension 02/22/2006    Caprice RedChasse, Greg Flowers  PT 04/07/2019, 4:34 PM  [facility] [address] [place], [place], [address] Phone: [phone]   Fax:  [phone]  Name: Greg Flowers MRN: [id number] Date of Birth: Nov 07, 1955

## 2019-04-08 ENCOUNTER — Encounter: Payer: Federal, State, Local not specified - PPO | Admitting: Physical Therapy

## 2019-04-14 ENCOUNTER — Other Ambulatory Visit: Payer: Self-pay

## 2019-04-14 ENCOUNTER — Ambulatory Visit: Payer: Federal, State, Local not specified - PPO

## 2019-04-14 DIAGNOSIS — M25552 Pain in left hip: Secondary | ICD-10-CM

## 2019-04-14 DIAGNOSIS — M25652 Stiffness of left hip, not elsewhere classified: Secondary | ICD-10-CM | POA: Diagnosis not present

## 2019-04-14 DIAGNOSIS — R262 Difficulty in walking, not elsewhere classified: Secondary | ICD-10-CM

## 2019-04-14 DIAGNOSIS — M6281 Muscle weakness (generalized): Secondary | ICD-10-CM

## 2019-04-14 NOTE — Therapy (Signed)
[place], [place], [address] Phone: [phone]   Fax:  [phone]  Physical Therapy Treatment  Patient Details  Name: Greg Flowers MRN: [id number] Date of Birth: 1955-08-14 Referring Provider (PT): Melrose Nakayama   Encounter Date: 04/14/2019  PT End of Session - 04/14/19 1542    Visit Number  26    Number of Visits  27    Date for PT Re-Evaluation  04/16/19    Authorization Type  BCBS federal    PT Start Time  0330    PT Stop Time  0415    PT Time Calculation (min)  45 min    Activity Tolerance  Patient tolerated treatment well    Behavior During Therapy  [facility] for tasks assessed/performed       Past Medical History:  Diagnosis Date  . Anxiety   . Calculus, kidney 2000   Sees Dr. Risa Grill, urology.   . Cervical spondylosis 2004   sp surgery by Dr. Ellene Route  . Depression   . GERD (gastroesophageal reflux disease)   . Headache(784.0)    Chronic, on vicodin 5 TID for this.   . Hepatitis C    Has occasional visits with Dr. Oletta Lamas GI, failed RX in 2000.  Liver Biopsy 9/06: Minimally active hepatitis consistent with hepatitis C, minimal necroinflamtory activity grade 1, no incrased fibrosis stage 0.   . Herpes labialis   . History of kidney stones   . Hypertension   . Pneumonia   . Renal stone   . Spondylolisthesis of lumbar region   . Thalassemia    HGB 9/09 14.4 wtih MCV 72.6  . Wears glasses     Past Surgical History:  Procedure Laterality Date  . ANTERIOR CERVICAL DECOMP/DISCECTOMY FUSION N/A 11/21/2017   Procedure: ANTERIOR CERVICAL DECOMPRESSION FUSION, CERVICAL THREE-FOUR, CERVICAL FOUR-FIVE WITH INSTRUMENTATION AND ALLOGRAFT;  Surgeon: Phylliss Bob, MD;  Location: [place];  Service: Orthopedics;  Laterality: N/A;  ANTERIOR CERVICAL DECOMPRESSION FUSION, CERVICAL THREE-FOUR, CERVICAL FOUR-FIVE WITH INSTRUMENTATION AND ALLOGRAFT  . BACK SURGERY  2017   L4-L5 PLATE/SCREWS  . CERVICAL  DISCECTOMY  2004   Dr Ellene Route  . COLONOSCOPY    . LITHOTRIPSY  2004   Dr Amalia Hailey  . NASAL SEPTUM SURGERY  2007  . TONSILLECTOMY    . TOTAL HIP ARTHROPLASTY Left 11/18/2018   Procedure: Left Anterior Hip Arthroplasty;  Surgeon: Melrose Nakayama, MD;  Location: [facility];  Service: Orthopedics;  Laterality: Left;    There were no vitals filed for this visit.  Subjective Assessment - 04/14/19 1537    Subjective  1/10 Lt hip soreness. Doing well.    Pain Score  1     Pain Location  Hip    Pain Orientation  Left    Pain Type  Chronic pain;Surgical pain    Pain Radiating Towards  LT lat thigh    Pain Onset  More than a month ago    Pain Frequency  Intermittent    Aggravating Factors   weight bearing    Pain Relieving Factors  rest ,meds         [facility] PT Assessment - 04/14/19 0001      PROM   Overall PROM Comments  some deep hip pain with flexion . may be impingement so instructed in using fulcrum of rolled towel in ant hip with knee to chest stretch    Left Hip Extension  10    Left Hip Flexion  110  Left Hip External Rotation   45    Left Hip Internal Rotation   30    Left Hip ABduction  43    Left Hip ADduction  27      Strength   Overall Strength Comments  Lt leg tests strong but balance weak and walks with RT leg ER.    Left Hip Flexion  5/5    Left Hip Extension  4+/5    Left Hip External Rotation  5/5    Left Hip Internal Rotation  5/5    Left Hip ABduction  4+/5      Standardized Balance Assessment   Standardized Balance Assessment  --   SLS LT 30 sec RT 3-5 sec                  [facility] Adult PT Treatment/Exercise - 04/14/19 0001      Neuro Re-ed    Neuro Re-ed Details   single leg standing practice RT best 5 sec and LT best 30 sec       Knee/Hip Exercises: Aerobic   Nustep  L6  8 min LE      Knee/Hip Exercises: Standing   Other Standing Knee Exercises  sit and supine MMT with reps after MMT.                   PT Long Term Goals -  04/14/19 1543      PT LONG TERM GOAL #1   Title  Pt will be I and compliant with HEP.)    Status  Achieved      PT LONG TERM GOAL #2   Title  Pt will improve ROM to [facility] to improve mobility    Status  Achieved      PT LONG TERM GOAL #3   Title  He will report pain decreased 50% or more  in LT hip with normal activity including yardwork    Status  Achieved      PT LONG TERM GOAL #4   Title  Pt will improve Lt hip strength to at least 5-/5 MMT to improve function    Baseline  Hip abduction and extension. 4+/5 approaching 5-/5    Status  Partially Met      PT LONG TERM GOAL #5   Title  He will improve 5TSTS test to 12 seconds or less    Status  Achieved            Plan - 04/14/19 1543    Clinical Impression Statement  He tested with goo to normal strength and good ROM of Lt hip with good balance. His left leg tested strong but balance significantly decreased on LT .   May have impact from prevvious back and neck surgery.  Will reviesw all hEP next session then dicharge.    PT Treatment/Interventions  Aquatic Therapy;Electrical Stimulation;Cryotherapy;Iontophoresis 8m/ml Dexamethasone;Moist Heat;Ultrasound;Gait training;Stair training;Therapeutic activities;Therapeutic exercise;Balance training;Neuromuscular re-education;Passive range of motion;Dry needling;Joint Manipulations    PT Next Visit Plan  Discharge with HEP after review  MMT Rt hip again    PT Home Exercise Plan  standing march and hip abd, hamstring stretch, sit to stands, heel toe raises, LAQ,     mat with band hip clam and abduction and clam with bridge  band  added for side hip clam and abduciton , side steps , hip IR , clammnd bridge.  tandem standing and exagerated DF in standing for balance    Consulted and Agree with Plan of  Care  Patient       Patient will benefit from skilled therapeutic intervention in order to improve the following deficits and impairments:  Abnormal gait, Decreased activity tolerance,  Decreased balance, Decreased mobility, Decreased range of motion, Decreased strength, Difficulty walking, Impaired flexibility, Pain  Visit Diagnosis: Muscle weakness (generalized)  Pain in left hip  Difficulty in walking, not elsewhere classified     Problem List Patient Active Problem List   Diagnosis Date Noted  . Primary localized osteoarthritis of left hip 11/18/2018  . Primary osteoarthritis of left hip 11/18/2018  . Left hip pain secondary to OA  08/13/2018  . Microcytic anemia 08/13/2018  . Urinary retention 11/08/2017  . Liver fibrosis 04/30/2017  . NSAID long-term use 01/09/2016  . Chronic back pain secondary to DJD of cervical, thoracic and lumbar spine and disc herniation 01/09/2016  . Osteoarthritis of both knees 11/02/2011  . FOOT PAIN, BILATERAL 09/20/2009  . DISORDER, DEPRESSIVE NEC 09/25/2006  . HERPES LABIALIS 04/04/2006  . Chronic hepatitis C without hepatic coma ([place]) 02/22/2006  . Essential hypertension 02/22/2006    [name]  PT 04/14/2019, 4:42 PM  [facility] [address] [place], [place], [address] Phone: [phone]   Fax:  [phone]  Name: Greg Flowers MRN: [id number] Date of Birth: 09-Oct-1955

## 2019-04-16 ENCOUNTER — Ambulatory Visit: Payer: Federal, State, Local not specified - PPO

## 2019-04-16 ENCOUNTER — Other Ambulatory Visit: Payer: Self-pay

## 2019-04-16 DIAGNOSIS — M6281 Muscle weakness (generalized): Secondary | ICD-10-CM

## 2019-04-16 DIAGNOSIS — R262 Difficulty in walking, not elsewhere classified: Secondary | ICD-10-CM

## 2019-04-16 DIAGNOSIS — M25652 Stiffness of left hip, not elsewhere classified: Secondary | ICD-10-CM | POA: Diagnosis not present

## 2019-04-16 NOTE — Therapy (Signed)
[place], [place], [address] Phone: [phone]   Fax:  [phone]  Physical Therapy Treatment/Discharge  Patient Details  Name: Greg Flowers MRN: [id number] Date of Birth: 1955/09/21 Referring Provider (PT): Melrose Nakayama   Encounter Date: 04/16/2019  PT End of Session - 04/16/19 1550    Visit Number  27    Number of Visits  27    Date for PT Re-Evaluation  04/16/19    Authorization Type  [employer] federal    PT Start Time  0330    PT Stop Time  0415    PT Time Calculation (min)  45 min    Activity Tolerance  Patient tolerated treatment well    Behavior During Therapy  [facility] for tasks assessed/performed       Past Medical History:  Diagnosis Date  . Anxiety   . Calculus, kidney 2000   Sees Dr. Risa Grill, urology.   . Cervical spondylosis 2004   sp surgery by Dr. Ellene Route  . Depression   . GERD (gastroesophageal reflux disease)   . Headache(784.0)    Chronic, on vicodin 5 TID for this.   . Hepatitis C    Has occasional visits with Dr. Oletta Lamas GI, failed RX in 2000.  Liver Biopsy 9/06: Minimally active hepatitis consistent with hepatitis C, minimal necroinflamtory activity grade 1, no incrased fibrosis stage 0.   . Herpes labialis   . History of kidney stones   . Hypertension   . Pneumonia   . Renal stone   . Spondylolisthesis of lumbar region   . Thalassemia    HGB 9/09 14.4 wtih MCV 72.6  . Wears glasses     Past Surgical History:  Procedure Laterality Date  . ANTERIOR CERVICAL DECOMP/DISCECTOMY FUSION N/A 11/21/2017   Procedure: ANTERIOR CERVICAL DECOMPRESSION FUSION, CERVICAL THREE-FOUR, CERVICAL FOUR-FIVE WITH INSTRUMENTATION AND ALLOGRAFT;  Surgeon: Phylliss Bob, MD;  Location: [facility];  Service: Orthopedics;  Laterality: N/A;  ANTERIOR CERVICAL DECOMPRESSION FUSION, CERVICAL THREE-FOUR, CERVICAL FOUR-FIVE WITH INSTRUMENTATION AND ALLOGRAFT  . BACK SURGERY  2017   L4-L5 PLATE/SCREWS  .  CERVICAL DISCECTOMY  2004   Dr Ellene Route  . COLONOSCOPY    . LITHOTRIPSY  2004   Dr Amalia Hailey  . NASAL SEPTUM SURGERY  2007  . TONSILLECTOMY    . TOTAL HIP ARTHROPLASTY Left 11/18/2018   Procedure: Left Anterior Hip Arthroplasty;  Surgeon: Melrose Nakayama, MD;  Location: [facility];  Service: Orthopedics;  Laterality: Left;    There were no vitals filed for this visit.  Subjective Assessment - 04/16/19 1541    Subjective  Doing pretty well today. Did some yard work yesterday    Currently in Pain?  No/denies         [facility] PT Assessment - 04/16/19 0001      PROM   Left Hip Extension  10    Left Hip Flexion  110    Left Hip External Rotation   45    Left Hip Internal Rotation   30    Left Hip ABduction  43    Left Hip ADduction  27      Strength   Right Hip Flexion  5/5    Right Hip Extension  4+/5    Right Hip ABduction  4-/5    Right Hip ADduction  4/5    Left Hip Flexion  5/5    Left Hip Extension  4+/5    Left Hip External Rotation  5/5  Left Hip Internal Rotation  5/5    Left Hip ABduction  4+/5    Left Hip ADduction  4+/5                   [facility] Adult PT Treatment/Exercise - 04/16/19 0001      Knee/Hip Exercises: Aerobic   Nustep  L6  8 min LE      Knee/Hip Exercises: Standing   Hip Flexion  Right;Left;15 reps    Hip Flexion Limitations  10# on LT  with step up 8inch step    Hip Abduction  Right;Left;20 reps    Abduction Limitations  10# on LT     Hip Extension  Right;Left;20 reps    Extension Limitations  10#  on LT     Lateral Step Up  Left;15 reps;Hand Hold: 2    Other Standing Knee Exercises  above done with staff for support      Knee/Hip Exercises: Seated   Long Arc Quad  Left    Long Arc Quad Weight  10 lbs.    Long [employer] Limitations  30 reps LT/.       Knee/Hip Exercises: Supine   Bridges  Both;15 reps      Knee/Hip Exercises: Sidelying   Hip ABduction  Left;20 reps                  PT Long Term Goals - 04/16/19 1618       PT LONG TERM GOAL #1   Title  Pt will be I and compliant with HEP.)    Status  Achieved      PT LONG TERM GOAL #2   Title  Pt will improve ROM to [facility] to improve mobility    Status  Achieved      PT LONG TERM GOAL #3   Title  He will report pain decreased 50% or more  in LT hip with normal activity including yardwork    Status  Achieved      PT LONG TERM GOAL #4   Title  Pt will improve Lt hip strength to at least 5-/5 MMT to improve function    Baseline  Hip abduction and extension. 4+/5 approaching 5-/5    Status  Partially Met      PT LONG TERM GOAL #5   Title  He will improve 5TSTS test to 12 seconds or less    Status  Achieved            Plan - 04/16/19 1550    Clinical Impression Statement  He is ready for discharge as long as he continues HEP on regular basis. He should do these 3-4 x/week . Advised him to practice balance for RT leg as this leg is significantly worse than LT leg.  He also has some mildly worse strength onRT hip so this may contribute to decr balance but it is so much worse I am not sure why.    PT Treatment/Interventions  Aquatic Therapy;Electrical Stimulation;Cryotherapy;Iontophoresis 25m/ml Dexamethasone;Moist Heat;Ultrasound;Gait training;Stair training;Therapeutic activities;Therapeutic exercise;Balance training;Neuromuscular re-education;Passive range of motion;Dry needling;Joint Manipulations    PT Next Visit Plan  Discharge with HEP    PT Home Exercise Plan  standing march and hip abd, hamstring stretch, sit to stands, heel toe raises, LAQ,     mat with band hip clam and abduction and clam with bridge  band  added for side hip clam and abduciton , side steps , hip IR , clammnd bridge.  tandem standing  and exagerated DF in standing for balance    Consulted and Agree with Plan of Care  Patient       Patient will benefit from skilled therapeutic intervention in order to improve the following deficits and impairments:  Abnormal gait, Decreased  activity tolerance, Decreased balance, Decreased mobility, Decreased range of motion, Decreased strength, Difficulty walking, Impaired flexibility, Pain  Visit Diagnosis: Muscle weakness (generalized)  Difficulty in walking, not elsewhere classified     Problem List Patient Active Problem List   Diagnosis Date Noted  . Primary localized osteoarthritis of left hip 11/18/2018  . Primary osteoarthritis of left hip 11/18/2018  . Left hip pain secondary to OA  08/13/2018  . Microcytic anemia 08/13/2018  . Urinary retention 11/08/2017  . Liver fibrosis 04/30/2017  . NSAID long-term use 01/09/2016  . Chronic back pain secondary to DJD of cervical, thoracic and lumbar spine and disc herniation 01/09/2016  . Osteoarthritis of both knees 11/02/2011  . FOOT PAIN, BILATERAL 09/20/2009  . DISORDER, DEPRESSIVE NEC 09/25/2006  . HERPES LABIALIS 04/04/2006  . Chronic hepatitis C without hepatic coma ([place]) 02/22/2006  . Essential hypertension 02/22/2006    [name] PT 04/16/2019, 4:20 PM  [facility] [address] [place], [place], [address] Phone: [phone]   Fax:  [phone]  Name: Greg Flowers MRN: [id number] Date of Birth: 1956-03-17  PHYSICAL THERAPY DISCHARGE SUMMARY  Visits from Start of Care: 27  Current functional level related to goals / functional outcomes: See above   Remaining deficits: See above  Education / Equipment: HEP Plan: Patient agrees to discharge.  Patient goals were met. Patient is being discharged due to meeting the stated rehab goals.  ?????

## 2019-05-01 ENCOUNTER — Telehealth: Payer: Federal, State, Local not specified - PPO

## 2019-05-15 ENCOUNTER — Encounter: Payer: Self-pay | Admitting: Internal Medicine

## 2019-05-18 ENCOUNTER — Other Ambulatory Visit: Payer: Self-pay

## 2019-05-18 ENCOUNTER — Ambulatory Visit (INDEPENDENT_AMBULATORY_CARE_PROVIDER_SITE_OTHER): Payer: Federal, State, Local not specified - PPO | Admitting: Internal Medicine

## 2019-05-18 DIAGNOSIS — M5410 Radiculopathy, site unspecified: Secondary | ICD-10-CM

## 2019-05-18 DIAGNOSIS — M10379 Gout due to renal impairment, unspecified ankle and foot: Secondary | ICD-10-CM | POA: Diagnosis not present

## 2019-05-18 DIAGNOSIS — R200 Anesthesia of skin: Secondary | ICD-10-CM | POA: Diagnosis not present

## 2019-05-18 DIAGNOSIS — G629 Polyneuropathy, unspecified: Secondary | ICD-10-CM | POA: Insufficient documentation

## 2019-05-18 DIAGNOSIS — G8929 Other chronic pain: Secondary | ICD-10-CM

## 2019-05-18 DIAGNOSIS — N289 Disorder of kidney and ureter, unspecified: Secondary | ICD-10-CM | POA: Diagnosis not present

## 2019-05-18 DIAGNOSIS — I10 Essential (primary) hypertension: Secondary | ICD-10-CM

## 2019-05-18 DIAGNOSIS — Z79899 Other long term (current) drug therapy: Secondary | ICD-10-CM

## 2019-05-18 DIAGNOSIS — M25552 Pain in left hip: Secondary | ICD-10-CM

## 2019-05-18 DIAGNOSIS — M1612 Unilateral primary osteoarthritis, left hip: Secondary | ICD-10-CM

## 2019-05-18 DIAGNOSIS — M549 Dorsalgia, unspecified: Secondary | ICD-10-CM

## 2019-05-18 MED ORDER — HYDROCHLOROTHIAZIDE 25 MG PO TABS: 25.0000 mg | ORAL_TABLET | Freq: Every day | ORAL | 3 refills | Status: DC

## 2019-05-18 MED ORDER — LOSARTAN POTASSIUM 50 MG PO TABS: 50.0000 mg | ORAL_TABLET | Freq: Every day | ORAL | 3 refills | Status: DC

## 2019-05-18 MED ORDER — GABAPENTIN 600 MG PO TABS: 600.0000 mg | ORAL_TABLET | Freq: Two times a day (BID) | ORAL | 1 refills | Status: DC

## 2019-05-18 MED ORDER — ATENOLOL 50 MG PO TABS: ORAL_TABLET | ORAL | 2 refills | Status: DC

## 2019-05-18 MED ORDER — ALLOPURINOL 100 MG PO TABS: 50.0000 mg | ORAL_TABLET | Freq: Every day | ORAL | 1 refills | Status: DC

## 2019-05-18 MED ORDER — AMLODIPINE BESYLATE 10 MG PO TABS: 10.0000 mg | ORAL_TABLET | Freq: Every day | ORAL | 3 refills | Status: DC

## 2019-05-20 ENCOUNTER — Encounter: Payer: Self-pay | Admitting: Internal Medicine

## 2019-05-20 DIAGNOSIS — M10379 Gout due to renal impairment, unspecified ankle and foot: Secondary | ICD-10-CM | POA: Insufficient documentation

## 2019-05-20 NOTE — Assessment & Plan Note (Signed)
Refill home allopurinol.

## 2019-05-20 NOTE — Progress Notes (Signed)
  [facility] Residency Telephone Encounter Continuity Care Appointment  HPI:   This telephone encounter was created for [name] on 05/20/2019 for the following purpose/cc HTN follow-up in bilateral feet numbness.   Past Medical History:  Past Medical History:  Diagnosis Date  . Anxiety   . Calculus, kidney 2000   Sees Dr. Isabel Caprice, urology.   . Cervical spondylosis 2004   sp surgery by Dr. Danielle Dess  . Depression   . GERD (gastroesophageal reflux disease)   . Headache(784.0)    Chronic, on vicodin 5 TID for this.   . Hepatitis C    Has occasional visits with Dr. Randa Evens GI, failed RX in 2000.  Liver Biopsy 9/06: Minimally active hepatitis consistent with hepatitis C, minimal necroinflamtory activity grade 1, no incrased fibrosis stage 0.   . Herpes labialis   . History of kidney stones   . Hypertension   . Pneumonia   . Renal stone   . Spondylolisthesis of lumbar region   . Thalassemia    HGB 9/09 14.4 wtih MCV 72.6  . Wears glasses       ROS:   Feet numbness and joint pain. Denies chest pain, palpitations, SOB, cough, LE swelling, and HA.    Assessment / Plan / Recommendations:   Please see A&P under problem oriented charting for assessment of the patient's acute and chronic medical conditions.   As always, pt is advised that if symptoms worsen or new symptoms arise, they should go to an urgent care facility or to to ER for further evaluation.   Consent and Medical Decision Making:   Patient discussed with Dr. Antony Contras  This is a telephone encounter between Greg Flowers and Greg Flowers on 05/20/2019 for problem stated above. The visit was conducted with the patient located at home and Greg Flowers at [facility]. The patient's identity was confirmed using their DOB and current address. The patient has consented to being evaluated through a telephone encounter and understands the associated risks (an examination cannot be done and the  patient may need to come in for an appointment) / benefits (allows the patient to remain at home, decreasing exposure to coronavirus). I personally spent 10 minutes on medical discussion.

## 2019-05-20 NOTE — Assessment & Plan Note (Signed)
Patient checks blood pressure at home and states he has been 130-140s/70-80s.  I refilled and amlodipine, atenolol, HCTZ, and losartan.

## 2019-05-20 NOTE — Assessment & Plan Note (Signed)
Patient reports bilateral numbness and tingling over both feet that has been present for months. Denies focal weakness.  He used to be on gabapentin for chronic back pain with radiculopathy but ran out and symptoms have been worsening since then.  He has not had a work-up for this. - Follow up A1c, B12, TSH, RPR, and HIV. Lab appt made for 2/8 - Gabapentin 600 mg BID for symptom relief pending results of blood work

## 2019-05-21 NOTE — Progress Notes (Signed)
Internal Medicine Clinic Attending  Case discussed with Dr. Santos-Sanchez at the time of the visit.  We reviewed the resident's history and exam and pertinent patient test results.  I agree with the assessment, diagnosis, and plan of care documented in the resident's note.    

## 2019-05-22 ENCOUNTER — Telehealth: Payer: Self-pay | Admitting: Hematology and Oncology

## 2019-05-22 NOTE — Addendum Note (Signed)
Addended by: Bufford Spikes on: 05/22/2019 09:21 AM   Modules accepted: Orders

## 2019-05-22 NOTE — Telephone Encounter (Signed)
Received a new hem referral from Dr. Marca Ancona for low mcv. Greg Flowers has been cld and scheduled to see Dr. Leonides Schanz on 2/18 at 1pm. Pt aware to arrive 15 minutes early.

## 2019-05-25 ENCOUNTER — Other Ambulatory Visit (INDEPENDENT_AMBULATORY_CARE_PROVIDER_SITE_OTHER): Payer: Federal, State, Local not specified - PPO

## 2019-05-25 ENCOUNTER — Other Ambulatory Visit: Payer: Self-pay

## 2019-05-25 DIAGNOSIS — R200 Anesthesia of skin: Secondary | ICD-10-CM | POA: Diagnosis not present

## 2019-05-25 DIAGNOSIS — R202 Paresthesia of skin: Secondary | ICD-10-CM | POA: Diagnosis not present

## 2019-05-25 LAB — POCT GLYCOSYLATED HEMOGLOBIN (HGB A1C): Hemoglobin A1C: 5.6 % (ref 4.0–5.6)

## 2019-05-25 LAB — GLUCOSE, CAPILLARY: Glucose-Capillary: 120 mg/dL — ABNORMAL HIGH (ref 70–99)

## 2019-05-28 ENCOUNTER — Encounter: Payer: Self-pay | Admitting: Internal Medicine

## 2019-05-29 LAB — RPR: RPR Ser Ql: NONREACTIVE

## 2019-05-29 LAB — VITAMIN B12: Vitamin B-12: >2000 pg/mL — ABNORMAL HIGH (ref 232–1245)

## 2019-05-29 LAB — HIV ANTIBODY (ROUTINE TESTING W REFLEX): HIV Screen 4th Generation wRfx: NONREACTIVE

## 2019-05-29 LAB — TSH: TSH: 1.49 u[IU]/mL (ref 0.450–4.500)

## 2019-05-31 ENCOUNTER — Ambulatory Visit: Payer: Federal, State, Local not specified - PPO

## 2019-06-02 ENCOUNTER — Telehealth: Payer: Self-pay | Admitting: Hematology and Oncology

## 2019-06-02 NOTE — Telephone Encounter (Signed)
Pt cld and rescheduled appt to see Dr. Leonides Schanz on 3/3 at 1pm. Pt aware to arrive 15 minutes early.

## 2019-06-03 ENCOUNTER — Encounter: Payer: Self-pay | Admitting: Internal Medicine

## 2019-06-04 ENCOUNTER — Other Ambulatory Visit: Payer: Federal, State, Local not specified - PPO

## 2019-06-04 ENCOUNTER — Encounter: Payer: Federal, State, Local not specified - PPO | Admitting: Hematology and Oncology

## 2019-06-05 ENCOUNTER — Ambulatory Visit: Payer: Federal, State, Local not specified - PPO

## 2019-06-07 ENCOUNTER — Ambulatory Visit: Payer: Federal, State, Local not specified - PPO | Attending: Internal Medicine

## 2019-06-07 DIAGNOSIS — Z23 Encounter for immunization: Secondary | ICD-10-CM | POA: Insufficient documentation

## 2019-06-07 NOTE — Progress Notes (Signed)
   Covid-19 Vaccination Clinic  Name:  Greg Flowers    MRN: [id number] DOB: 1955-07-23  06/07/2019  [name] was observed post Covid-19 immunization for 15 minutes without incidence. He was provided with Vaccine Information Sheet and instruction to access the V-Safe system.   Greg Flowers was instructed to call 911 with any severe reactions post vaccine: Marland Kitchen Difficulty breathing  . Swelling of your face and throat  . A fast heartbeat  . A bad rash all over your body  . Dizziness and weakness    Immunizations Administered    Name Date Dose VIS Date Route   Pfizer COVID-19 Vaccine 06/07/2019 11:50 AM 0.3 mL 03/27/2019 Intramuscular   Manufacturer: [employer], [employer]   Lot: [id number]   NDC: [address]-5

## 2019-06-17 ENCOUNTER — Other Ambulatory Visit: Payer: Self-pay

## 2019-06-17 ENCOUNTER — Inpatient Hospital Stay: Payer: Federal, State, Local not specified - PPO

## 2019-06-17 ENCOUNTER — Inpatient Hospital Stay
Payer: Federal, State, Local not specified - PPO | Attending: Hematology and Oncology | Admitting: Hematology and Oncology

## 2019-06-17 ENCOUNTER — Encounter: Payer: Self-pay | Admitting: Hematology and Oncology

## 2019-06-17 VITALS — BP 120/87 | HR 83 | Temp 98.2°F | Resp 18 | Ht 69.0 in | Wt 214.2 lb

## 2019-06-17 DIAGNOSIS — Z8249 Family history of ischemic heart disease and other diseases of the circulatory system: Secondary | ICD-10-CM | POA: Insufficient documentation

## 2019-06-17 DIAGNOSIS — D509 Iron deficiency anemia, unspecified: Secondary | ICD-10-CM | POA: Diagnosis not present

## 2019-06-17 DIAGNOSIS — Z79899 Other long term (current) drug therapy: Secondary | ICD-10-CM | POA: Diagnosis not present

## 2019-06-17 DIAGNOSIS — I10 Essential (primary) hypertension: Secondary | ICD-10-CM | POA: Diagnosis not present

## 2019-06-17 DIAGNOSIS — K219 Gastro-esophageal reflux disease without esophagitis: Secondary | ICD-10-CM | POA: Diagnosis not present

## 2019-06-17 DIAGNOSIS — D569 Thalassemia, unspecified: Secondary | ICD-10-CM | POA: Insufficient documentation

## 2019-06-17 DIAGNOSIS — D7589 Other specified diseases of blood and blood-forming organs: Secondary | ICD-10-CM | POA: Insufficient documentation

## 2019-06-17 DIAGNOSIS — B182 Chronic viral hepatitis C: Secondary | ICD-10-CM | POA: Insufficient documentation

## 2019-06-17 DIAGNOSIS — Z87442 Personal history of urinary calculi: Secondary | ICD-10-CM | POA: Insufficient documentation

## 2019-06-17 DIAGNOSIS — Z87891 Personal history of nicotine dependence: Secondary | ICD-10-CM | POA: Insufficient documentation

## 2019-06-17 DIAGNOSIS — Z8719 Personal history of other diseases of the digestive system: Secondary | ICD-10-CM | POA: Diagnosis not present

## 2019-06-17 DIAGNOSIS — Z7289 Other problems related to lifestyle: Secondary | ICD-10-CM | POA: Diagnosis not present

## 2019-06-17 LAB — CBC WITH DIFFERENTIAL (CANCER CENTER ONLY)
Abs Immature Granulocytes: 0.02 10*3/uL (ref 0.00–0.07)
Basophils Absolute: 0.1 10*3/uL (ref 0.0–0.1)
Basophils Relative: 1 %
Eosinophils Absolute: 0.2 10*3/uL (ref 0.0–0.5)
Eosinophils Relative: 3 %
HCT: 39.8 % (ref 39.0–52.0)
Hemoglobin: 12.3 g/dL — ABNORMAL LOW (ref 13.0–17.0)
Immature Granulocytes: 0 %
Lymphocytes Relative: 36 %
Lymphs Abs: 2.9 10*3/uL (ref 0.7–4.0)
MCH: 22.1 pg — ABNORMAL LOW (ref 26.0–34.0)
MCHC: 30.9 g/dL (ref 30.0–36.0)
MCV: 71.6 fL — ABNORMAL LOW (ref 80.0–100.0)
Monocytes Absolute: 0.8 10*3/uL (ref 0.1–1.0)
Monocytes Relative: 10 %
Neutro Abs: 3.9 10*3/uL (ref 1.7–7.7)
Neutrophils Relative %: 50 %
Platelet Count: 196 10*3/uL (ref 150–400)
RBC: 5.56 MIL/uL (ref 4.22–5.81)
RDW: 15.8 % — ABNORMAL HIGH (ref 11.5–15.5)
WBC Count: 7.8 10*3/uL (ref 4.0–10.5)
nRBC: 0 % (ref 0.0–0.2)

## 2019-06-17 LAB — CMP (CANCER CENTER ONLY)
ALT: 11 U/L (ref 0–44)
AST: 15 U/L (ref 15–41)
Albumin: 3.8 g/dL (ref 3.5–5.0)
Alkaline Phosphatase: 74 U/L (ref 38–126)
Anion gap: 8 (ref 5–15)
BUN: 14 mg/dL (ref 8–23)
CO2: 30 mmol/L (ref 22–32)
Calcium: 9.2 mg/dL (ref 8.9–10.3)
Chloride: 103 mmol/L (ref 98–111)
Creatinine: 1.37 mg/dL — ABNORMAL HIGH (ref 0.61–1.24)
GFR, Est AFR Am: >60 mL/min (ref >60)
GFR, Estimated: 54 mL/min — ABNORMAL LOW (ref >60)
Glucose, Bld: 113 mg/dL — ABNORMAL HIGH (ref 70–99)
Potassium: 3.8 mmol/L (ref 3.5–5.1)
Sodium: 141 mmol/L (ref 135–145)
Total Bilirubin: 0.3 mg/dL (ref 0.3–1.2)
Total Protein: 7.1 g/dL (ref 6.5–8.1)

## 2019-06-17 LAB — SEDIMENTATION RATE: Sed Rate: 18 mm/hr — ABNORMAL HIGH (ref 0–16)

## 2019-06-17 LAB — C-REACTIVE PROTEIN: CRP: 0.7 mg/dL (ref <1.0)

## 2019-06-17 LAB — FERRITIN: Ferritin: 100 ng/mL (ref 24–336)

## 2019-06-17 LAB — IRON AND TIBC
Iron: 82 ug/dL (ref 42–163)
Saturation Ratios: 25 % (ref 20–55)
TIBC: 328 ug/dL (ref 202–409)
UIBC: 246 ug/dL (ref 117–376)

## 2019-06-17 LAB — RETIC PANEL
Immature Retic Fract: 7.3 % (ref 2.3–15.9)
RBC.: 5.59 MIL/uL (ref 4.22–5.81)
Retic Count, Absolute: 53.1 10*3/uL (ref 19.0–186.0)
Retic Ct Pct: 1 % (ref 0.4–3.1)
Reticulocyte Hemoglobin: 26.3 pg — ABNORMAL LOW (ref >27.9)

## 2019-06-17 LAB — TSH: TSH: 1.341 u[IU]/mL (ref 0.320–4.118)

## 2019-06-17 NOTE — Progress Notes (Signed)
[place] Telephone:([phone]   Fax:([phone]) [place] NOTE  Patient Care Team: Welford Roche, MD as PCP - General  Hematological/Oncological History # Microcytic Anemia 1) Records date back to 01/09/2008. No normal MCV on record.  2) 12/12/2016: WBC 11.8, Hgb 13.4, MCV 70, Plt 297 3) 11/14/2018: WBC 9.4, Hgb 12.0, Plt 247 4) 05/11/2019: WBC 9.4, Hgb 12.5, MCV 69.6, Plt 207, ALC 3200. TIBC 336 (nml), Iron 102, Iron Sat 30%, ferritin 48.2. 5) 06/17/2019: establish care with Dr. Lorenso Courier   CHIEF COMPLAINTS/PURPOSE OF CONSULTATION:  "Low MCV "  HISTORY OF PRESENTING ILLNESS:  Greg Flowers 63 y.o. male with medical history significant for chronic hepatitis C, GERD, hx of esophageal ulcer, and renal stones who presents for evaluation of a longstanding microcytosis.   On review of the previous records Greg Flowers has microcytosis dating back to at least 12/20/2007.  Throughout our 12 years of records he has never had a normal MCV.  His normal range is from approximately 67-72.  Throughout this time he has had low normal to mildly low hemoglobins, never dropping lower than 11.  On his most recent PCP visit on 05/11/2019 the patient was found to have white blood cell count of 9.4, hemoglobin 12.5, MCV of 69.6, and a platelet count of 207.  Of note he also did have a mild increase in his lymphocytes with an ALC of 3200.  Iron studies at that time showed an iron sat of 30% and ferritin of 48.2.  Due to these findings of microcytosis without iron deficiency anemia the patient was referred to hematology for further evaluation and management.  On exam today Greg Flowers notes that he feels well.  He reports that he is still recovering from his prior hip surgery and is walking with a cane.  He does note that his pain is well controlled with simply as needed Tylenol.  On discussion today Greg Flowers notes that he has no personal or family history of any blood disorders.  He  notes that his mother did die of bone/blood cancer but is uncertain of which subtype.  He does note that he was a prior smoker from 50 until 1989 but has not smoked since that time.  He does have 1-2 mixed drinks per week and has never been a particularly heavy consumer of alcohol.  He does note that he occasionally has issues with belly pain but the pantoprazole that he is taking is helping.  On further discussion he notes that he has a well-rounded diet but avoids pork and beef because these are things that his wife does not eat.  He also notes that when he was told he had a low MCV in the most part because by iron that he began taking an over-the-counter iron pill.  Otherwise he denies that he has had any other recent symptoms.  He reports no fevers, chills, sweats, nausea, vomiting, or diarrhea.  Full 10 point ROS is listed below.  MEDICAL HISTORY:  Past Medical History:  Diagnosis Date   Anxiety    Calculus, kidney 2000   Sees Dr. Risa Grill, urology.    Cervical spondylosis 2004   sp surgery by Dr. Ellene Route   Depression    GERD (gastroesophageal reflux disease)    Headache(784.0)    Chronic, on vicodin 5 TID for this.    Hepatitis C    Has occasional visits with Dr. Oletta Lamas GI, failed RX in 2000.  Liver Biopsy 9/06: Minimally active hepatitis  consistent with hepatitis C, minimal necroinflamtory activity grade 1, no incrased fibrosis stage 0.    Herpes labialis    History of kidney stones    Hypertension    Pneumonia    Renal stone    Spondylolisthesis of lumbar region    Thalassemia    HGB 9/09 14.4 wtih MCV 72.6   Wears glasses     SURGICAL HISTORY: Past Surgical History:  Procedure Laterality Date   ANTERIOR CERVICAL DECOMP/DISCECTOMY FUSION N/A 11/21/2017   Procedure: ANTERIOR CERVICAL DECOMPRESSION FUSION, CERVICAL THREE-FOUR, CERVICAL FOUR-FIVE WITH INSTRUMENTATION AND ALLOGRAFT;  Surgeon: Phylliss Bob, MD;  Location: [facility];  Service: Orthopedics;  Laterality:  N/A;  ANTERIOR CERVICAL DECOMPRESSION FUSION, CERVICAL THREE-FOUR, CERVICAL FOUR-FIVE WITH INSTRUMENTATION AND ALLOGRAFT   BACK SURGERY  2017   L4-L5 PLATE/SCREWS   CERVICAL DISCECTOMY  2004   Dr Ellene Route   COLONOSCOPY     LITHOTRIPSY  2004   Dr Amalia Hailey   NASAL SEPTUM SURGERY  2007   TONSILLECTOMY     TOTAL HIP ARTHROPLASTY Left 11/18/2018   Procedure: Left Anterior Hip Arthroplasty;  Surgeon: Melrose Nakayama, MD;  Location: [facility];  Service: Orthopedics;  Laterality: Left;    SOCIAL HISTORY: Social History   Socioeconomic History   Marital status: Married    Spouse name: Not on file   Number of children: Not on file   Years of education: Not on file   Highest education level: Not on file  Occupational History   Not on file  Tobacco Use   Smoking status: Former Smoker    Quit date: 04/16/1984    Years since quitting: 35.1   Smokeless tobacco: Never Used   Tobacco comment: quit in the early 1980's  Substance and Sexual Activity   Alcohol use: Yes    Comment: Occasionally 2-3 glasses wine/week   Drug use: No   Sexual activity: Yes    Partners: Female  Other Topics Concern   Not on file  Social History Narrative   Works at post office, Married, Exercises.    Social Determinants of Health   Financial Resource Strain:    Difficulty of Paying Living Expenses: Not on file  Food Insecurity:    Worried About Charity fundraiser in the Last Year: Not on file   [employer] of Food in the Last Year: Not on file  Transportation Needs:    Lack of Transportation (Medical): Not on file   Lack of Transportation (Non-Medical): Not on file  Physical Activity:    Days of Exercise per Week: Not on file   Minutes of Exercise per Session: Not on file  Stress:    Feeling of Stress : Not on file  Social Connections:    Frequency of Communication with Friends and Family: Not on file   Frequency of Social Gatherings with Friends and Family: Not on file   Attends  Religious Services: Not on file   Active Member of Clubs or Organizations: Not on file   Attends Archivist Meetings: Not on file   Marital Status: Not on file  Intimate Partner Violence:    Fear of Current or Ex-Partner: Not on file   Emotionally Abused: Not on file   Physically Abused: Not on file   Sexually Abused: Not on file    FAMILY HISTORY: Family History  Problem Relation Age of Onset   Hypertension Mother    Hypertension Father     ALLERGIES:  has No Known Allergies.  MEDICATIONS:  Current Outpatient Medications  Medication Sig Dispense Refill   allopurinol (ZYLOPRIM) 100 MG tablet Take 0.5 tablets (50 mg total) by mouth daily. 45 tablet 1   amLODipine (NORVASC) 10 MG tablet Take 1 tablet (10 mg total) by mouth daily. 90 tablet 3   amoxicillin (AMOXIL) 500 MG capsule Take 500 mg by mouth 3 (three) times daily.     amoxicillin (AMOXIL) 500 MG tablet TAKE 4 TABLETS BY MOUTH 1 HOUR PRIOR TO DENTAL PROCEDURE.     APPLE CIDER VINEGAR PO Take 480 mg by mouth daily.     atenolol (TENORMIN) 50 MG tablet TAKE 1 TABLET BY MOUTH EVERY DAY 90 tablet 2   b complex vitamins tablet Take 1 tablet by mouth daily.     ferrous sulfate 325 (65 FE) MG tablet Take 325 mg by mouth daily with breakfast.     gabapentin (NEURONTIN) 600 MG tablet Take 1 tablet (600 mg total) by mouth 2 (two) times daily. 60 tablet 1   Garlic 4536 MG TBEC Take 2,000 mg by mouth daily.     Honey 5 GM/5ML SYRP Take by mouth.     hydrochlorothiazide (HYDRODIURIL) 25 MG tablet Take 1 tablet (25 mg total) by mouth daily. 90 tablet 3   losartan (COZAAR) 50 MG tablet Take 1 tablet (50 mg total) by mouth daily. 90 tablet 3   Multiple Vitamin (MULTIVITAMIN WITH MINERALS) TABS tablet Take 1 tablet by mouth daily.     pantoprazole (PROTONIX) 40 MG tablet Take 40 mg by mouth 2 (two) times daily.      pregabalin (LYRICA) 75 MG capsule Take 75 mg by mouth 2 (two) times daily.      promethazine (PHENERGAN) 12.5 MG tablet Take 12.5 mg by mouth every 6 (six) hours as needed.     sodium chloride (OCEAN) 0.65 % nasal spray Place into the nose.     Tamsulosin HCl (FLOMAX) 0.4 MG CAPS Take 0.8 mg by mouth daily.      No current facility-administered medications for this visit.    REVIEW OF SYSTEMS:   Constitutional: ( - ) fevers, ( - )  chills , ( - ) night sweats Eyes: ( - ) blurriness of vision, ( - ) double vision, ( - ) watery eyes Ears, nose, mouth, throat, and face: ( - ) mucositis, ( - ) sore throat Respiratory: ( - ) cough, ( - ) dyspnea, ( - ) wheezes Cardiovascular: ( - ) palpitation, ( - ) chest discomfort, ( - ) lower extremity swelling Gastrointestinal:  ( - ) nausea, ( - ) heartburn, ( - ) change in bowel habits Skin: ( - ) abnormal skin rashes Lymphatics: ( - ) new lymphadenopathy, ( - ) easy bruising Neurological: ( - ) numbness, ( - ) tingling, ( - ) new weaknesses Behavioral/Psych: ( - ) mood change, ( - ) new changes  All other systems were reviewed with the patient and are negative.  PHYSICAL EXAMINATION: ECOG PERFORMANCE STATUS: 1 - Symptomatic but completely ambulatory  Vitals:   06/17/19 1320  BP: 120/87  Pulse: 83  Resp: 18  Temp: 98.2 F (36.8 C)  SpO2: 98%   Filed Weights   06/17/19 1320  Weight: 214 lb 3.2 oz (97.2 kg)    GENERAL: well appearing middle aged male in NAD  SKIN: skin color, texture, turgor are normal, no rashes or significant lesions EYES: conjunctiva are pink and non-injected, sclera clear LUNGS: clear to auscultation and percussion with normal breathing effort  HEART: regular rate & rhythm and no murmurs and no lower extremity edema ABDOMEN: soft, non-tender, non-distended, normal bowel sounds. No HSM Musculoskeletal: no cyanosis of digits and no clubbing  PSYCH: alert & oriented x 3, fluent speech NEURO: no focal motor/sensory deficits  LABORATORY DATA:  I have reviewed the data as listed CBC Latest Ref Rng  & Units 11/14/2018 11/15/2017 11/04/2017  WBC 4.0 - 10.5 K/uL 9.4 9.1 8.6  Hemoglobin 13.0 - 17.0 g/dL 12.0(L) 12.3(L) 11.6(L)  Hematocrit 39.0 - 52.0 % 40.6 40.7 36.1(L)  Platelets 150 - 400 K/uL 247 283 236    CMP Latest Ref Rng & Units 11/14/2018 10/06/2018 09/29/2018  Glucose 70 - 99 mg/dL 106(H) 100(H) 93  BUN 8 - 23 mg/dL 18 13 45(H)  Creatinine 0.61 - 1.24 mg/dL 1.32(H) 1.12 2.08(H)  Sodium 135 - 145 mmol/L 140 144 138  Potassium 3.5 - 5.1 mmol/L 4.1 4.0 4.4  Chloride 98 - 111 mmol/L 100 102 98  CO2 22 - 32 mmol/L _0 Calcium 8.9 - 10.3 mg/dL 9.8 9.4 9.2  Total Protein 6.5 - 8.1 g/dL - - -  Total Bilirubin 0.3 - 1.2 mg/dL - - -  Alkaline Phos 38 - 126 U/L - - -  AST 15 - 41 U/L - - -  ALT 0 - 44 U/L - - -    PATHOLOGY: None relevant to review.   RADIOGRAPHIC STUDIES: I have personally reviewed the radiological images as listed and agreed with the findings in the report. No results found.  ASSESSMENT & PLAN Greg Flowers 64 y.o. male with medical history significant for chronic hepatitis C, GERD, hx of esophageal ulcer, and renal stones who presents for evaluation of a longstanding microcytosis.  After review of the blood work and previous records his findings are most consistent with a longstanding macrocytosis.  Given the chronicity of this and his relatively normal to low normal hemoglobin these findings are most consistent with a hemoglobinopathy.  Strong suspicion at this time would be for thalassemia as the primary cause.  Over the further evaluate this we will order hemoglobin electrophoresis as well as alpha globulin gene mapping.  In order to be thorough we will conduct full nutritional work-up as well to include iron studies, copper, and zinc.  Chronic inflammation can also cause a microcytosis and we will evaluate for this with ESR and CRP.  Chronic alcohol use can also cause a longstanding microcytosis, but the patient states he only drinks 1-2 mixed drinks per week  which would be low to cause microcytosis.  Assuming the patient does not have a nutritional deficiency there is no intervention that is required for chronic microcytosis.  He does not require routine follow-up in our clinic but would recommend that his PCP check at least yearly CBCs and refer him to our clinic in the event that he were to develop any other hematological abnormalities.  Colon cancer screening history: Colonoscopy in 2015 showed internal hemorrhoids/diverticulosis.  FOBT x 3 negative in 2018.  CT 06/2018: diverticulosis  #Chronic Microcytic Anemia --will collect CBC, CMP, and peripheral blood film --today will repeat nutritional studies with iron panel, ferritin. Will additionally collect copper and zinc levels  --given the chronicity there is strong suspicion for a hemoglobinopathy. Can start with a hemoglobin electrophoresis. Alpha globulin gene mapping can be considered to determine if the patient has Alpha thalassemia minor --assess for chronic inflammation with ESR and CRP.  --if diagnosis of thalassemia is confirmed, there are  no particular treatments or interventions that are required, particularly because his anemia is mild.  --RTC PRN or if the above results show a concerning abnormality.   Orders Placed This Encounter  Procedures   CBC with Differential ([facility] Only)    Standing Status:   Future    Standing Expiration Date:   06/16/2020   Retic Panel    Standing Status:   Future    Standing Expiration Date:   06/16/2020   CMP ([facility] only)    Standing Status:   Future    Standing Expiration Date:   06/16/2020   Iron and TIBC    Standing Status:   Future    Standing Expiration Date:   06/16/2020   Ferritin    Standing Status:   Future    Standing Expiration Date:   06/16/2020   TSH    Standing Status:   Future    Standing Expiration Date:   06/16/2020   Sedimentation rate    Standing Status:   Future    Standing Expiration Date:   06/16/2020    C-reactive protein    Standing Status:   Future    Standing Expiration Date:   06/16/2020   Hemoglobinopathy evaluation    Standing Status:   Future    Standing Expiration Date:   06/16/2020   Alpha-Thalassemia GenotypR    Standing Status:   Future    Standing Expiration Date:   06/16/2020   Copper, serum    Standing Status:   Future    Standing Expiration Date:   06/16/2020   Zinc    Standing Status:   Future    Standing Expiration Date:   06/16/2020    All questions were answered. The patient knows to call the clinic with any problems, questions or concerns.  A total of more than 60 minutes were spent on this encounter and over half of that time was spent on counseling and coordination of care as outlined above.   Greg Peoples, MD Department of Hematology/Oncology [place] [facility] Phone: [phone] Pager: [phone] Email: [email or web address]  06/17/2019 1:56 PM

## 2019-06-29 LAB — ZINC: Zinc: 62 ug/dL (ref 44–115)

## 2019-06-29 LAB — HGB FRACTIONATION CASCADE
Hgb A2: 2.5 % (ref 1.8–3.2)
Hgb A: 97.5 % (ref 96.4–98.8)
Hgb F: 0 % (ref 0.0–2.0)
Hgb S: 0 %

## 2019-06-29 LAB — ALPHA-THALASSEMIA GENOTYPR

## 2019-06-29 LAB — COPPER, SERUM: Copper: 86 ug/dL (ref 69–132)

## 2019-07-01 ENCOUNTER — Ambulatory Visit: Payer: Federal, State, Local not specified - PPO | Attending: Internal Medicine

## 2019-07-01 ENCOUNTER — Telehealth: Payer: Self-pay | Admitting: *Deleted

## 2019-07-01 DIAGNOSIS — Z23 Encounter for immunization: Secondary | ICD-10-CM

## 2019-07-01 NOTE — Progress Notes (Signed)
   Covid-19 Vaccination Clinic  Name:  Greg Flowers    MRN: [id number] DOB: 1955-10-11  07/01/2019  Greg Flowers was observed post Covid-19 immunization for 15 minutes without incident. He was provided with Vaccine Information Sheet and instruction to access the V-Safe system.   Greg Flowers was instructed to call 911 with any severe reactions post vaccine: Marland Kitchen Difficulty breathing  . Swelling of face and throat  . A fast heartbeat  . A bad rash all over body  . Dizziness and weakness   Immunizations Administered    Name Date Dose VIS Date Route   Pfizer COVID-19 Vaccine 07/01/2019 12:14 PM 0.3 mL 03/27/2019 Intramuscular   Manufacturer: [employer], [employer]   Lot: [id number]   NDC: [address]-7

## 2019-07-01 NOTE — Telephone Encounter (Signed)
TCT patient regarding lab results from 06/17/19. No answer but was able to leave vm message on an identified phone #. Provided call back 3 of [phone] for pt to call back @ his convenience. Pt has confirmed Alpha Thalassemia Minor  (for Alpha Thalassemia Trait).  This means his red blood cells are smal and he has a mild anemia. There are no particular treatments for this and should not have any effect on his day to day life or overall health. No further f/u is needed at this clinic

## 2019-07-01 NOTE — Telephone Encounter (Signed)
-----   Message from Jaci Standard, MD sent at 06/29/2019 11:39 AM EDT ----- Please let Greg Flowers know that his labs confirm he has Alpha Thalassemia Minor (for Alpha Thalassemia Trait). This means his Red Blood Cells are small and he has a mild anemia. We discussed this during our last visit.   There are no particular treatments for this condition and it should not have any effect on his health or day to day life.   No further f/u is required in our clinic. Please have him call with an further questions or concerns.  Azucena Freed  ----- Message ----- From: Leory Plowman, Lab In [place] Sent: 06/17/2019   2:25 PM EDT To: Jaci Standard, MD

## 2019-07-15 ENCOUNTER — Other Ambulatory Visit: Payer: Self-pay | Admitting: Internal Medicine

## 2019-07-15 DIAGNOSIS — M25552 Pain in left hip: Secondary | ICD-10-CM

## 2019-07-24 ENCOUNTER — Telehealth: Payer: Self-pay | Admitting: *Deleted

## 2019-07-24 NOTE — Telephone Encounter (Signed)
Received call from [facility]/Dr Percell Miller office stating they were referring MD & have not seen any referral notes.  Reviewed chart & routed Dr Derek Mound OV note & labs to Dr Marca Ancona.  Message left for Dr Leonides Schanz to route any f/u notes to her.

## 2019-08-04 ENCOUNTER — Telehealth: Payer: Self-pay | Admitting: *Deleted

## 2019-08-04 NOTE — Telephone Encounter (Signed)
Patient called back to discuss lab results.   Patient is curious if the Alpha Thalassemia Minor trait is hereditary, he questions because he has a 25 year daughter and 5 month on grandchild.  Should they be tested, any concerns they should be aware of.  Also asked if this was something he was born with.  Can it progress to anything that would warrant further investigation.  Routed to Dr. Leonides Schanz and primary nurse [facility] for response.  Call back to patient would be appreciated.  Preferably afternoon calls.

## 2019-08-10 ENCOUNTER — Encounter: Payer: Self-pay | Admitting: *Deleted

## 2019-08-12 ENCOUNTER — Telehealth: Payer: Self-pay | Admitting: Hematology and Oncology

## 2019-08-12 NOTE — Telephone Encounter (Signed)
Returned call to [name] to discuss the results of his blood work.  His findings were consistent with alpha thalassemia and he recently called in order to discuss the applications of this.  His hemoglobin electrophoresis was normal, however his alpha thalassemia DNA analysis showed that he did have alpha thalassemia minor.  He is an unaffected carrier of the off of the alpha thalassemia trait.  His family does not require any additional screening and there are no complications expected later in life from his of thalassemia.  Unfortunately the patient was unable to answer the phone and therefore message was left.  I requested callback noting that we could discuss his results in greater detail if he were to call us back.  Ulysees Barns, MD Department of Hematology/Oncology [facility] at [facility] Phone: [phone] Pager: [phone] Email: [email or web address]

## 2019-08-13 NOTE — Telephone Encounter (Signed)
Entered in error

## 2019-09-01 ENCOUNTER — Telehealth: Payer: Self-pay

## 2019-09-01 NOTE — Telephone Encounter (Signed)
Received TC from patient stating that he was returning Dr Derek Mound phone call. I let him know that Dr Leonides Schanz is not in today but will be back tomorrow and I will let him know that he returned his phone call.

## 2019-09-15 ENCOUNTER — Encounter: Payer: Self-pay | Admitting: Hematology and Oncology

## 2019-09-15 ENCOUNTER — Encounter: Payer: Self-pay | Admitting: Internal Medicine

## 2020-01-28 ENCOUNTER — Other Ambulatory Visit: Payer: Self-pay

## 2020-01-28 ENCOUNTER — Ambulatory Visit (INDEPENDENT_AMBULATORY_CARE_PROVIDER_SITE_OTHER): Payer: Federal, State, Local not specified - PPO | Admitting: Sports Medicine

## 2020-01-28 ENCOUNTER — Encounter: Payer: Self-pay | Admitting: Sports Medicine

## 2020-01-28 DIAGNOSIS — R200 Anesthesia of skin: Secondary | ICD-10-CM

## 2020-01-28 DIAGNOSIS — G629 Polyneuropathy, unspecified: Secondary | ICD-10-CM

## 2020-01-28 DIAGNOSIS — R202 Paresthesia of skin: Secondary | ICD-10-CM | POA: Diagnosis not present

## 2020-01-28 DIAGNOSIS — M79671 Pain in right foot: Secondary | ICD-10-CM | POA: Diagnosis not present

## 2020-01-28 DIAGNOSIS — M79672 Pain in left foot: Secondary | ICD-10-CM

## 2020-01-28 MED ORDER — GABAPENTIN 300 MG PO CAPS: 600.0000 mg | ORAL_CAPSULE | Freq: Two times a day (BID) | ORAL | 3 refills | Status: DC

## 2020-01-28 NOTE — Progress Notes (Signed)
Subjective: Greg Flowers is a 64 y.o. male patient who presents to office for evaluation of numbness and tingling to both feet.  Patient reports that he got worse after his left hip surgery and also has had neck and back surgery was prescribed gabapentin in the past which seemed to be more effective than the Lyrica.  Patient denies any other pedal complaints at this time.  Patient Active Problem List   Diagnosis Date Noted  . Acute gout due to renal impairment involving toe 05/20/2019  . Numbness and tingling of both feet 05/18/2019  . Primary localized osteoarthritis of left hip 11/18/2018  . Primary osteoarthritis of left hip 11/18/2018  . Microcytic anemia 08/13/2018  . Preop examination 11/01/2017  . Disc disease, degenerative, cervical 11/01/2017  . Liver fibrosis 04/30/2017  . NSAID long-term use 01/09/2016  . Chronic back pain secondary to DJD of cervical, thoracic and lumbar spine and disc herniation 01/09/2016  . Osteoarthritis of both knees 11/02/2011  . DISORDER, DEPRESSIVE NEC 09/25/2006  . HERPES LABIALIS 04/04/2006  . Chronic hepatitis C without hepatic coma (HCC) 02/22/2006  . Essential hypertension 02/22/2006    Current Outpatient Medications on File Prior to Visit  Medication Sig Dispense Refill  . allopurinol (ZYLOPRIM) 100 MG tablet Take 0.5 tablets (50 mg total) by mouth daily. 45 tablet 1  . amLODipine (NORVASC) 10 MG tablet Take 1 tablet (10 mg total) by mouth daily. 90 tablet 3  . amoxicillin (AMOXIL) 500 MG capsule Take 500 mg by mouth 3 (three) times daily.    Marland Kitchen amoxicillin (AMOXIL) 500 MG tablet TAKE 4 TABLETS BY MOUTH 1 HOUR PRIOR TO DENTAL PROCEDURE.    . APPLE CIDER VINEGAR PO Take 480 mg by mouth daily.    Marland Kitchen atenolol (TENORMIN) 50 MG tablet TAKE 1 TABLET BY MOUTH EVERY DAY 90 tablet 2  . b complex vitamins tablet Take 1 tablet by mouth daily.    . celecoxib (CELEBREX) 200 MG capsule Take 200 mg by mouth daily.    . ferrous sulfate 325 (65 FE) MG tablet  Take 325 mg by mouth daily with breakfast.    . Garlic 2000 MG TBEC Take 2,000 mg by mouth daily.    Marland Kitchen Honey 5 GM/5ML SYRP Take by mouth.    . hydrochlorothiazide (HYDRODIURIL) 25 MG tablet Take 1 tablet (25 mg total) by mouth daily. 90 tablet 3  . HYDROcodone-acetaminophen (NORCO/VICODIN) 5-325 MG tablet Take by mouth.    . losartan (COZAAR) 50 MG tablet Take 1 tablet (50 mg total) by mouth daily. 90 tablet 3  . Multiple Vitamin (MULTIVITAMIN WITH MINERALS) TABS tablet Take 1 tablet by mouth daily.    . pantoprazole (PROTONIX) 40 MG tablet Take 40 mg by mouth 2 (two) times daily.     . predniSONE (STERAPRED UNI-PAK 21 TAB) 10 MG (21) TBPK tablet Take by mouth.    . pregabalin (LYRICA) 75 MG capsule Take 75 mg by mouth 2 (two) times daily.    . promethazine (PHENERGAN) 12.5 MG tablet Take 12.5 mg by mouth every 6 (six) hours as needed.    . sodium chloride (OCEAN) 0.65 % nasal spray Place into the nose.    . Tamsulosin HCl (FLOMAX) 0.4 MG CAPS Take 0.8 mg by mouth daily.      No current facility-administered medications on file prior to visit.    No Known Allergies  Objective:  General: Alert and oriented x3 in no acute distress  Dermatology: No open lesions bilateral lower  extremities, no webspace macerations, no ecchymosis bilateral, all nails x 10 are well manicured.  Vascular: Dorsalis Pedis and Posterior Tibial pedal pulses palpable, Capillary Fill Time 3 seconds,(+) pedal hair growth bilateral, no edema bilateral lower extremities, Temperature gradient within normal limits.  Neurology: Gross sensation intact via light touch bilateral, vibratory sensation slightly diminished bilateral first MPJs.  Musculoskeletal: No reproducible tenderness to palpation bilateral.  Pes planus foot type.  Mild lesser hammertoe noted bilateral.  Assessment and Plan: Problem List Items Addressed This Visit      Other   Numbness and tingling of both feet - Primary    Other Visit Diagnoses     Neuropathy       Foot pain, bilateral           -Complete examination performed -Discussed treatement options for neuropathy likely secondary to radiculopathy with positive NCV -Rx gabapentin 600 mg twice daily advised patient if this fails to provide additional relief will need to be referred to neurology since he has tried also Lyrica with no improvement -Advised good supportive shoes daily for foot type -Patient to return to office as needed or if fails to continue to improve or sooner if condition worsens.  Asencion Islam, DPM

## 2020-03-03 ENCOUNTER — Other Ambulatory Visit: Payer: Self-pay | Admitting: *Deleted

## 2020-03-03 DIAGNOSIS — M10379 Gout due to renal impairment, unspecified ankle and foot: Secondary | ICD-10-CM

## 2020-03-03 MED ORDER — ALLOPURINOL 100 MG PO TABS: 50.0000 mg | ORAL_TABLET | Freq: Every day | ORAL | 1 refills | Status: DC

## 2020-03-29 ENCOUNTER — Other Ambulatory Visit: Payer: Self-pay

## 2020-03-29 MED ORDER — GABAPENTIN 300 MG PO CAPS
600.0000 mg | ORAL_CAPSULE | Freq: Two times a day (BID) | ORAL | 3 refills | Status: DC
Start: 2020-03-29 — End: 2020-04-24

## 2020-04-23 ENCOUNTER — Encounter: Payer: Self-pay | Admitting: Sports Medicine

## 2020-04-24 ENCOUNTER — Other Ambulatory Visit: Payer: Self-pay | Admitting: Sports Medicine

## 2020-04-24 MED ORDER — GABAPENTIN 300 MG PO CAPS
600.0000 mg | ORAL_CAPSULE | Freq: Two times a day (BID) | ORAL | 3 refills | Status: DC
Start: 2020-04-24 — End: 2020-05-25

## 2020-04-24 NOTE — Progress Notes (Signed)
Gabapentin has been refilled and sent to [facility].  -Dr. Marylene Land

## 2020-05-11 ENCOUNTER — Other Ambulatory Visit: Payer: Self-pay

## 2020-05-11 DIAGNOSIS — I10 Essential (primary) hypertension: Secondary | ICD-10-CM

## 2020-05-11 MED ORDER — ATENOLOL 50 MG PO TABS: ORAL_TABLET | ORAL | 2 refills | Status: DC

## 2020-05-25 ENCOUNTER — Encounter: Payer: Self-pay | Admitting: Student

## 2020-05-25 ENCOUNTER — Ambulatory Visit: Payer: Federal, State, Local not specified - PPO | Admitting: Student

## 2020-05-25 ENCOUNTER — Other Ambulatory Visit: Payer: Self-pay

## 2020-05-25 VITALS — BP 125/91 | HR 68 | Temp 98.4°F | Wt 208.8 lb

## 2020-05-25 DIAGNOSIS — M545 Low back pain, unspecified: Secondary | ICD-10-CM

## 2020-05-25 DIAGNOSIS — I10 Essential (primary) hypertension: Secondary | ICD-10-CM | POA: Diagnosis not present

## 2020-05-25 DIAGNOSIS — Z1211 Encounter for screening for malignant neoplasm of colon: Secondary | ICD-10-CM

## 2020-05-25 DIAGNOSIS — D563 Thalassemia minor: Secondary | ICD-10-CM | POA: Insufficient documentation

## 2020-05-25 DIAGNOSIS — G629 Polyneuropathy, unspecified: Secondary | ICD-10-CM | POA: Diagnosis not present

## 2020-05-25 DIAGNOSIS — K7400 Hepatic fibrosis, unspecified: Secondary | ICD-10-CM

## 2020-05-25 DIAGNOSIS — M10379 Gout due to renal impairment, unspecified ankle and foot: Secondary | ICD-10-CM | POA: Diagnosis not present

## 2020-05-25 DIAGNOSIS — G8929 Other chronic pain: Secondary | ICD-10-CM

## 2020-05-25 DIAGNOSIS — Z0000 Encounter for general adult medical examination without abnormal findings: Secondary | ICD-10-CM

## 2020-05-25 DIAGNOSIS — Z23 Encounter for immunization: Secondary | ICD-10-CM | POA: Diagnosis not present

## 2020-05-25 MED ORDER — AMLODIPINE BESYLATE 10 MG PO TABS: 10.0000 mg | ORAL_TABLET | Freq: Every day | ORAL | 3 refills | Status: DC

## 2020-05-25 MED ORDER — GABAPENTIN 300 MG PO CAPS
600.0000 mg | ORAL_CAPSULE | Freq: Three times a day (TID) | ORAL | 3 refills | Status: DC
Start: 2020-05-25 — End: 2020-11-24

## 2020-05-25 MED ORDER — LOSARTAN POTASSIUM 50 MG PO TABS: 50.0000 mg | ORAL_TABLET | Freq: Every day | ORAL | 3 refills | Status: DC

## 2020-05-25 MED ORDER — SHINGRIX 50 MCG/0.5ML IM SUSR: 0.5000 mL | Freq: Once | INTRAMUSCULAR | 1 refills | Status: AC

## 2020-05-25 NOTE — Addendum Note (Signed)
Addended by: Maura Crandall on: 05/25/2020 04:26 PM   Modules accepted: Orders, SmartSet

## 2020-05-25 NOTE — Assessment & Plan Note (Signed)
Received flu shot and prevnar-13.  Sent prescription for Shingles vaccine to preferred pharmacy.  Ordered FIT stool testing for colon cancer screening.

## 2020-05-25 NOTE — Patient Instructions (Addendum)
[name],  It was a pleasure seeing you in the clinic today.   We discussed the following today:  1. I have refilled your amlodipine and losartan.  2. Please increase your gabapentin to 2 tablets three times daily.  3. You received your flu shot and pneumonia vaccine ([id number]). I have placed the prescription for your Shingles vaccine (please get it done at your preferred pharmacy). Lastly, please use to stool kit testing and bring it back to the clinic.  4. I got some labs today. I will call you if any results are abnormal.  Please call our clinic at [phone] if you have any questions or concerns. The best time to call is Monday-Friday from 9am-4pm, but there is someone available 24/7 at the same number. If you need medication refills, please notify your pharmacy one week in advance and they will send [place] a request.   Thank you for letting us take part in your care. We look forward to seeing you next time!

## 2020-05-25 NOTE — Progress Notes (Signed)
   CC: reestablish care and medication refills  HPI:  Greg Flowers is a 65 y.o. M with history listed below presenting to the [facility] to reestablish care and for medication refills. Please see individualized problem based charting for full HPI.  Past Medical History:  Diagnosis Date  . Anxiety   . Calculus, kidney 2000   Sees Dr. Isabel Caprice, urology.   . Cervical spondylosis 2004   sp surgery by Dr. Danielle Dess  . Depression   . GERD (gastroesophageal reflux disease)   . Headache(784.0)    Chronic, on vicodin 5 TID for this.   . Hepatitis C    Has occasional visits with Dr. Randa Evens GI, failed RX in 2000.  Liver Biopsy 9/06: Minimally active hepatitis consistent with hepatitis C, minimal necroinflamtory activity grade 1, no incrased fibrosis stage 0.   . Herpes labialis   . History of kidney stones   . Hypertension   . Pneumonia   . Renal stone   . Spondylolisthesis of lumbar region   . Thalassemia    HGB 9/09 14.4 wtih MCV 72.6  . Wears glasses     Review of Systems:  Negative aside from that listed in individualized problem based charting.  Physical Exam:  Vitals:   05/25/20 1437  BP: (!) 138/100  Pulse: 73  SpO2: 99%  Weight: 208 lb 12.8 oz (94.7 kg)   Physical Exam Constitutional:      Appearance: He is obese. He is not ill-appearing.  HENT:     Head: Normocephalic and atraumatic.     Nose: Nose normal. No congestion.     Mouth/Throat:     Mouth: Mucous membranes are moist.     Pharynx: Oropharynx is clear. No oropharyngeal exudate.  Eyes:     General: No scleral icterus.    Extraocular Movements: Extraocular movements intact.     Conjunctiva/sclera: Conjunctivae normal.     Pupils: Pupils are equal, round, and reactive to light.  Neck:     Comments: Mild stiffness/tenderness with rotation of neck, chronic.  Cardiovascular:     Rate and Rhythm: Normal rate and regular rhythm.     Pulses: Normal pulses.     Heart sounds: Normal heart sounds. No murmur heard. No  friction rub. No gallop.   Pulmonary:     Effort: Pulmonary effort is normal.     Breath sounds: Normal breath sounds. No wheezing, rhonchi or rales.  Abdominal:     General: Bowel sounds are normal. There is no distension.     Palpations: Abdomen is soft.     Tenderness: There is no abdominal tenderness.  Musculoskeletal:        General: No swelling.     Comments: Mild stiffness/tenderness with rotation of back, chronic.   Skin:    General: Skin is warm and dry.  Neurological:     General: No focal deficit present.     Mental Status: He is alert and oriented to person, place, and time.  Psychiatric:        Mood and Affect: Mood normal.        Behavior: Behavior normal.      Assessment & Plan:   See Encounters Tab for problem based charting.  Patient discussed with Dr. Oswaldo Done

## 2020-05-25 NOTE — Assessment & Plan Note (Signed)
Patient with history of neuropathy following hip replacement. He has been on gabapentin 600mg  BID with good control. Was recently started on lyrica 75mg  for unclear reason from different physician, but I discontinued this as patient is already on gabepentin. He does endorse increased pain and numbness/tingling in lower extremities and right hand (from carpal tunnel - diagnosed with nerve conduction studies as per patient). Therefore, I will increase gabapentin dosing to 600mg  TID. I will check CMP today to assess kidney function.  -increased gabapentin to 600mg  TID -f/u CMP

## 2020-05-25 NOTE — Assessment & Plan Note (Signed)
Patient has history of chronic back pain from degenerative joint disease in cervical, thoracic, and lumbar spine s/p ACDF of C3-C4 and C4-C5 over 2 years ago. He does still have diffuse back pain, but this appears to be a chronic issue. As per PDMP review, he has been receiving norco prn for pain in short courses by other providers. However, at this time, patient does not appear to have a flare up of chronic back pain. Discussed that increased gabapentin dose should help with back pain, but otherwise to try conservative measures (heating pads, stretching exercises as tolerated).  Plan: -gabapentin 600mg  TID -conservative measures -f/u in 2 months with PCP (Dr. )

## 2020-05-25 NOTE — Assessment & Plan Note (Signed)
Today's Vitals   05/25/20 1437 05/25/20 1438 05/25/20 1533  BP: (!) 138/100  (!) 125/91  Pulse: 73  68  Temp: 98.4 F (36.9 C)    TempSrc: Oral    SpO2: 99%    Weight: 208 lb 12.8 oz (94.7 kg)    PainSc:  5     Body mass index is 30.83 kg/m.  Patient with history of HTN, on norvasc 10mg , atenolol 50mg , HCTZ 25mg , and losartan 50mg . BP today 138/100. He was able to refill his HCTZ and atenolol in January, but he is now out of his norvasc and losartan. I will refill these two medications and have patient follow up in 2 months to meet PCP (Dr. ).  Plan: -refilled norvasc and losartan -continue taking atenolol and hctz -f/u in 2 months

## 2020-05-26 LAB — CMP14 + ANION GAP
ALT: 10 IU/L (ref 0–44)
AST: 14 IU/L (ref 0–40)
Albumin/Globulin Ratio: 1.6 (ref 1.2–2.2)
Albumin: 4.4 g/dL (ref 3.8–4.8)
Alkaline Phosphatase: 71 IU/L (ref 44–121)
Anion Gap: 15 mmol/L (ref 10.0–18.0)
BUN/Creatinine Ratio: 14 (ref 10–24)
BUN: 14 mg/dL (ref 8–27)
Bilirubin Total: <0.2 mg/dL (ref 0.0–1.2)
CO2: 26 mmol/L (ref 20–29)
Calcium: 9.7 mg/dL (ref 8.6–10.2)
Chloride: 99 mmol/L (ref 96–106)
Creatinine, Ser: 1.02 mg/dL (ref 0.76–1.27)
GFR calc Af Amer: 88 mL/min/{1.73_m2} (ref >59)
GFR calc non Af Amer: 76 mL/min/{1.73_m2} (ref >59)
Globulin, Total: 2.7 g/dL (ref 1.5–4.5)
Glucose: 82 mg/dL (ref 65–99)
Potassium: 4.2 mmol/L (ref 3.5–5.2)
Sodium: 140 mmol/L (ref 134–144)
Total Protein: 7.1 g/dL (ref 6.0–8.5)

## 2020-05-26 NOTE — Addendum Note (Signed)
Addended by: Erlinda Hong T on: 05/26/2020 09:06 AM   Modules accepted: Level of Service

## 2020-05-26 NOTE — Progress Notes (Signed)
Internal Medicine Clinic Attending  Case discussed with Dr. Jinwala  At the time of the visit.  We reviewed the resident's history and exam and pertinent patient test results.  I agree with the assessment, diagnosis, and plan of care documented in the resident's note.  

## 2020-07-07 ENCOUNTER — Other Ambulatory Visit: Payer: Federal, State, Local not specified - PPO

## 2020-07-07 ENCOUNTER — Other Ambulatory Visit: Payer: Self-pay

## 2020-07-07 DIAGNOSIS — Z1211 Encounter for screening for malignant neoplasm of colon: Secondary | ICD-10-CM

## 2020-07-08 LAB — FECAL OCCULT BLOOD, IMMUNOCHEMICAL: Fecal Occult Bld: NEGATIVE

## 2020-07-13 ENCOUNTER — Encounter: Payer: Self-pay | Admitting: Internal Medicine

## 2020-08-18 ENCOUNTER — Other Ambulatory Visit: Payer: Self-pay

## 2020-08-18 MED ORDER — HYDROCHLOROTHIAZIDE 25 MG PO TABS: 25.0000 mg | ORAL_TABLET | Freq: Every day | ORAL | 3 refills | Status: DC

## 2020-08-18 NOTE — Telephone Encounter (Signed)
hydrochlorothiazide (HYDRODIURIL) 25 MG tablet, refill request @  [id number] - [place],  - [address] Phone:  [phone]  Fax:  [phone]      

## 2020-08-23 ENCOUNTER — Other Ambulatory Visit: Payer: Self-pay | Admitting: Student

## 2020-08-23 DIAGNOSIS — M10379 Gout due to renal impairment, unspecified ankle and foot: Secondary | ICD-10-CM

## 2020-09-08 ENCOUNTER — Encounter: Payer: Self-pay | Admitting: *Deleted

## 2020-10-18 ENCOUNTER — Encounter: Payer: Self-pay | Admitting: *Deleted

## 2020-11-20 ENCOUNTER — Other Ambulatory Visit: Payer: Self-pay | Admitting: Internal Medicine

## 2020-11-24 ENCOUNTER — Other Ambulatory Visit: Payer: Self-pay

## 2020-11-24 MED ORDER — GABAPENTIN 300 MG PO CAPS: 600.0000 mg | ORAL_CAPSULE | Freq: Three times a day (TID) | ORAL | 3 refills | Status: DC

## 2020-11-24 NOTE — Telephone Encounter (Signed)
gabapentin (NEURONTIN) 300 MG capsule, REFILL REQUEST @   [id number] - [place], [place] - [address].

## 2020-12-15 ENCOUNTER — Ambulatory Visit (INDEPENDENT_AMBULATORY_CARE_PROVIDER_SITE_OTHER): Payer: Federal, State, Local not specified - PPO | Admitting: Internal Medicine

## 2020-12-15 ENCOUNTER — Other Ambulatory Visit: Payer: Self-pay

## 2020-12-15 ENCOUNTER — Encounter: Payer: Self-pay | Admitting: Internal Medicine

## 2020-12-15 VITALS — BP 125/85 | HR 75 | Temp 98.2°F | Ht 68.0 in | Wt 201.2 lb

## 2020-12-15 DIAGNOSIS — G8929 Other chronic pain: Secondary | ICD-10-CM | POA: Diagnosis not present

## 2020-12-15 DIAGNOSIS — Z0000 Encounter for general adult medical examination without abnormal findings: Secondary | ICD-10-CM | POA: Diagnosis not present

## 2020-12-15 DIAGNOSIS — I10 Essential (primary) hypertension: Secondary | ICD-10-CM

## 2020-12-15 DIAGNOSIS — M545 Low back pain, unspecified: Secondary | ICD-10-CM

## 2020-12-15 DIAGNOSIS — E782 Mixed hyperlipidemia: Secondary | ICD-10-CM

## 2020-12-15 NOTE — Assessment & Plan Note (Signed)
Patient has a history of chronic back pain secondary to degenerative joint disease in the cervical and lumbar spine status post ACDF of C3-C4 and C4-C5.  He is receiving gabapentin 600 mg 3 times daily up from twice daily to help with his pain.  Conservative measures were also employed including heating pad and stretching exercises as tolerated.  He reports that the increase in gabapentin has not helped and that his pain is a 10 today and is consistently an 8-10 out of 10, which he describes as sharp pain in the lower back.  He reports that heat and ice has helped take the edge off.  He has received Norco for short courses by other providers in the past and was requesting Tylenol 3 today however his orthopedic surgeon recently sent in a prescription for tramadol which he he was not aware of.  Pain does not appear to be controlled at this time.  We will continue with gabapentin 600 3 times daily tizanidine and over-the-counter Tylenol as well as conservative measures including heat ice and stretching.  Advised patient to fill tramadol prescription as well.  Will reevaluate at a later date

## 2020-12-15 NOTE — Assessment & Plan Note (Signed)
Patient reports that his blood pressure has been high in the past but that in recent years since he retired it has been better controlled.  He is denying any lightheadedness or dizziness today.  Blood pressure well controlled at 125/85.  We will continue Norvasc 10 and atenolol 50 HCTZ 25 and losartan 50.

## 2020-12-15 NOTE — Patient Instructions (Addendum)
Dear Mr. Deutscher,  Thank you for trusting [place] with your care today.  Today we addressed the following concerns:  You are reporting that your back pain is still present and severe 8 out of 10.  The increase in gabapentin has not helped.  You were reporting that heat and ice have helped take the edge off.  For now please continue taking the gabapentin at your current dose and continue the over-the-counter Tylenol as well as the tizanidine.  A prescription for tramadol was sent in by your orthopedic surgeon.  Please utilize that medication as prescribed.  If you have difficulty filling that please call their office.  Your hypertension is well controlled with your current medications.  We will not make any changes to that regimen.  Please continue taking your medications as prescribed.  Your most recent lipid panel was 8 years ago.  We will recheck a lipid panel today and call you with the results.  Please follow-up in 6 months or sooner if your symptoms worsen or do not improve.

## 2020-12-15 NOTE — Assessment & Plan Note (Signed)
Patient has not had a lipid panel checked in approximately 8 years.  Lipid panel ordered, will follow up.  May need to be started on statin depending on those results.

## 2020-12-15 NOTE — Progress Notes (Signed)
   CC: Checkup  HPI:[name] is a 65 y.o. male who presents for evaluation of  checkup. Please see individual problem based A/P for details.  Please see the encounters tab for problem-based charting.  Depression, PHQ-9: Based on the patients  Flowsheet Row Office Visit from 05/25/2020 in [facility]  PHQ-9 Total Score 6      score we have 6.  Past Medical History:  Diagnosis Date   Anxiety    Calculus, kidney 2000   Sees Dr. Isabel Caprice, urology.    Cervical spondylosis 2004   sp surgery by Dr. Danielle Dess   Depression    GERD (gastroesophageal reflux disease)    Headache(784.0)    Chronic, on vicodin 5 TID for this.    Hepatitis C    Has occasional visits with Dr. Randa Evens GI, failed RX in 2000.  Liver Biopsy 9/06: Minimally active hepatitis consistent with hepatitis C, minimal necroinflamtory activity grade 1, no incrased fibrosis stage 0.    Herpes labialis    History of kidney stones    Hypertension    Pneumonia    Renal stone    Spondylolisthesis of lumbar region    Thalassemia    HGB 9/09 14.4 wtih MCV 72.6   Wears glasses    Review of Systems:   Review of Systems  Constitutional: Negative.   Eyes: Negative.   Respiratory: Negative.    Cardiovascular: Negative.   Gastrointestinal: Negative.   Musculoskeletal:  Positive for back pain and joint pain.  Skin: Negative.   Neurological: Negative.   Psychiatric/Behavioral: Negative.      Physical Exam: Vitals:   12/15/20 1440  BP: 125/85  Pulse: 75  Temp: 98.2 F (36.8 C)  TempSrc: Oral  SpO2: 98%  Weight: 201 lb 3.2 oz (91.3 kg)  Height: 5\' 8"  (1.727 m)     General: Alert and oriented no acute distress HEENT: Conjunctiva nl , antiicteric sclerae, moist mucous membranes, no exudate or erythema Cardiovascular: Normal rate, regular rhythm.  No murmurs, rubs, or gallops Pulmonary : Equal breath sounds, No wheezes, rales, or rhonchi Abdominal: soft, nontender,  bowel sounds  present Ext: No edema in lower extremities, no tenderness to palpation of lower extremities.   Assessment & Plan:   See Encounters Tab for problem based charting.  Patient seen with Dr. 

## 2020-12-16 LAB — LIPID PANEL
Chol/HDL Ratio: 4.4 ratio (ref 0.0–5.0)
Cholesterol, Total: 159 mg/dL (ref 100–199)
HDL: 36 mg/dL — ABNORMAL LOW (ref >39)
LDL Chol Calc (NIH): 105 mg/dL — ABNORMAL HIGH (ref 0–99)
Triglycerides: 99 mg/dL (ref 0–149)
VLDL Cholesterol Cal: 18 mg/dL (ref 5–40)

## 2020-12-16 NOTE — Progress Notes (Signed)
Internal Medicine Clinic Attending  I saw and evaluated the patient.  I personally confirmed the key portions of the history and exam documented by Dr.  Burnice Logan  and I reviewed pertinent patient test results.  The assessment, diagnosis, and plan were formulated together and I agree with the documentation in the resident's note.   It is reasonable to check a screening lipid panel today (recommendation would be q 5 years for normal risk patients, q 3 years for high risk patients).  Once lipid panel results, would check an ASCVD risk score to see if he would qualify for statin therapy.

## 2020-12-27 ENCOUNTER — Ambulatory Visit (INDEPENDENT_AMBULATORY_CARE_PROVIDER_SITE_OTHER): Payer: Federal, State, Local not specified - PPO | Admitting: *Deleted

## 2020-12-27 ENCOUNTER — Telehealth: Payer: Self-pay | Admitting: Internal Medicine

## 2020-12-27 DIAGNOSIS — Z23 Encounter for immunization: Secondary | ICD-10-CM | POA: Diagnosis not present

## 2020-12-27 NOTE — Telephone Encounter (Signed)
Pt requesting a call back about his test results 

## 2020-12-28 NOTE — Telephone Encounter (Signed)
Results given when pt came for his flu vaccine yesterday per Dr Burnice Logan.

## 2020-12-30 DIAGNOSIS — E785 Hyperlipidemia, unspecified: Secondary | ICD-10-CM | POA: Insufficient documentation

## 2020-12-30 MED ORDER — ATORVASTATIN CALCIUM 10 MG PO TABS: 10.0000 mg | ORAL_TABLET | Freq: Every day | ORAL | 11 refills | Status: DC

## 2020-12-30 NOTE — Addendum Note (Signed)
Addended by: Adron Bene T on: 12/30/2020 03:17 PM   Modules accepted: Orders

## 2020-12-30 NOTE — Assessment & Plan Note (Signed)
Lipid panel was obtained at last visit showing an LDL only moderately elevated, however due to other risk factors including tobacco use and concha mitten hypertension 10-year ASCVD risk calculated to be 26%.  We will start patient on moderate intensity statin with goal of lowering LDL by at least 30%.  We will have him follow-up in the clinic in 6 weeks for repeat lipid panel.

## 2021-01-03 ENCOUNTER — Telehealth: Payer: Self-pay | Admitting: Internal Medicine

## 2021-01-03 NOTE — Telephone Encounter (Signed)
Pt states he is returning a call from Dr. Burnice Logan.

## 2021-01-25 ENCOUNTER — Ambulatory Visit: Payer: Federal, State, Local not specified - PPO | Attending: Orthopedic Surgery

## 2021-01-25 ENCOUNTER — Other Ambulatory Visit: Payer: Self-pay

## 2021-01-25 DIAGNOSIS — M25652 Stiffness of left hip, not elsewhere classified: Secondary | ICD-10-CM | POA: Diagnosis present

## 2021-01-25 DIAGNOSIS — M6281 Muscle weakness (generalized): Secondary | ICD-10-CM | POA: Insufficient documentation

## 2021-01-25 DIAGNOSIS — R262 Difficulty in walking, not elsewhere classified: Secondary | ICD-10-CM | POA: Insufficient documentation

## 2021-01-25 DIAGNOSIS — M7062 Trochanteric bursitis, left hip: Secondary | ICD-10-CM | POA: Diagnosis present

## 2021-01-25 DIAGNOSIS — M7061 Trochanteric bursitis, right hip: Secondary | ICD-10-CM | POA: Diagnosis not present

## 2021-01-25 DIAGNOSIS — M25552 Pain in left hip: Secondary | ICD-10-CM | POA: Diagnosis present

## 2021-01-26 NOTE — Therapy (Signed)
[facility] [address] [place], [place], [address] Phone: [phone]   Fax:  [phone]  Physical Therapy Evaluation  Patient Details  Name: Greg Flowers MRN: [id number] Date of Birth: 03-20-1956 Referring Provider (PT): Estill Bamberg, MD   Encounter Date: 01/25/2021   PT End of Session - 01/26/21 2107     Visit Number 1    Number of Visits 17    Date for PT Re-Evaluation 04/01/21    Authorization Type [employer] PPO    PT Start Time 1340   pt late   PT Stop Time 1425    PT Time Calculation (min) 45 min    Activity Tolerance Patient tolerated treatment well    Behavior During Therapy [facility] for tasks assessed/performed             Past Medical History:  Diagnosis Date   Anxiety    Calculus, kidney 2000   Sees Dr. Isabel Caprice, urology.    Cervical spondylosis 2004   sp surgery by Dr. Danielle Dess   Depression    GERD (gastroesophageal reflux disease)    Headache(784.0)    Chronic, on vicodin 5 TID for this.    Hepatitis C    Has occasional visits with Dr. Randa Evens GI, failed RX in 2000.  Liver Biopsy 9/06: Minimally active hepatitis consistent with hepatitis C, minimal necroinflamtory activity grade 1, no incrased fibrosis stage 0.    Herpes labialis    History of kidney stones    Hypertension    Pneumonia    Renal stone    Spondylolisthesis of lumbar region    Thalassemia    HGB 9/09 14.4 wtih MCV 72.6   Wears glasses     Past Surgical History:  Procedure Laterality Date   ANTERIOR CERVICAL DECOMP/DISCECTOMY FUSION N/A 11/21/2017   Procedure: ANTERIOR CERVICAL DECOMPRESSION FUSION, CERVICAL THREE-FOUR, CERVICAL FOUR-FIVE WITH INSTRUMENTATION AND ALLOGRAFT;  Surgeon: Estill Bamberg, MD;  Location: [facility] OR;  Service: Orthopedics;  Laterality: N/A;  ANTERIOR CERVICAL DECOMPRESSION FUSION, CERVICAL THREE-FOUR, CERVICAL FOUR-FIVE WITH INSTRUMENTATION AND ALLOGRAFT   BACK SURGERY  2017   L4-L5 PLATE/SCREWS    CERVICAL DISCECTOMY  2004   Dr Danielle Dess   COLONOSCOPY     LITHOTRIPSY  2004   Dr Logan Bores   NASAL SEPTUM SURGERY  2007   TONSILLECTOMY     TOTAL HIP ARTHROPLASTY Left 11/18/2018   Procedure: Left Anterior Hip Arthroplasty;  Surgeon: Marcene Corning, MD;  Location: [facility];  Service: Orthopedics;  Laterality: Left;    There were no vitals filed for this visit.    Subjective Assessment - 01/26/21 2202     Subjective P reports around the first of the year both hips and low back started hurting. He received injections both hips and felt better for approx 2 week  Pt notes being told he has stenosis above and below hsi low back fusion.    Patient Stated Goals Improve flexibility and decreaase the pain for his hips and low back    Currently in Pain? Yes    Pain Score 5     Pain Location Hip   low back   Pain Orientation Right;Left    Pain Descriptors / Indicators Aching    Pain Onset More than a month ago    Pain Frequency Constant    Aggravating Factors  sleeping, up/down steps, sitting, picking itms off the floor                [facility] PT Assessment -  01/26/21 0001       Assessment   Medical Diagnosis greater [email or web address].[email or web address]    Referring Provider (PT) Estill Bamberg, MD    Onset Date/Surgical Date 04/16/20    Hand Dominance Right      Precautions   Precautions None      Restrictions   Weight Bearing Restrictions No      Balance Screen   Has the patient fallen in the past 6 months No    Has the patient had a decrease in activity level because of a fear of falling?  No    Is the patient reluctant to leave their home because of a fear of falling?  No      Home Environment   Living Environment Private residence    Living Arrangements Spouse/significant other    Type of Home House    Home Access Stairs to enter    Entrance Stairs-Number of Steps 2    Entrance Stairs-Rails None    Home Layout Two level    Alternate Level Stairs-Number of Steps 16    Alternate Level  Stairs-Rails Right      Prior Function   Vocation Retired      IT consultant   Overall Cognitive Status Within Functional Limits for tasks assessed      Observation/Other Assessments   Focus on Therapeutic Outcomes (FOTO)  29% functional ability      Sensation   Light Touch --   Both feet, min decreased, neuropathy     Posture/Postural Control   Posture/Postural Control Postural limitations    Postural Limitations Forward head;Rounded Shoulders;Increased thoracic kyphosis;Decreased lumbar lordosis      ROM / Strength   AROM / PROM / Strength AROM;PROM;Strength      PROM   Overall PROM Comments Both hips are WFLs and equal to each other      Strength   Strength Assessment Site Hip    Right/Left Hip Right;Left    Right Hip Flexion 3+/5    Right Hip Extension 3+/5    Right Hip External Rotation  3+/5    Right Hip Internal Rotation 3+/5    Right Hip ABduction 3+/5    Right Hip ADduction 3+/5    Left Hip Flexion 3/5    Left Hip Extension 3/5    Left Hip External Rotation 3/5    Left Hip Internal Rotation 3/5    Left Hip ABduction 3/5    Left Hip ADduction 3/5      Palpation   Palpation comment P is tender to the anterior aspects of both greater trochanters      Transfers   Transfers Sit to Stand;Stand to Sit    Sit to Stand 6: Modified independent (Device/Increase time)   decreased pace     Ambulation/Gait   Ambulation/Gait Yes    Ambulation/Gait Assistance 6: Modified independent (Device/Increase time)    Gait velocity decreased pace                        Objective measurements completed on examination: See above findings.                PT Education - 01/26/21 2105     Education Details Eval findings, POC, sleeping positions and support for comfort and improved sleep, HEP for hip flexibility and strengthening    Person(s) Educated Patient    Methods Explanation;Demonstration;Tactile cues;Verbal cues;Handout    Comprehension Verbalized  understanding;Returned demonstration;Verbal cues required;Tactile cues required  PT Short Term Goals - 01/26/21 2150       PT SHORT TERM GOAL #1   Title He will be independent with initial HEP    Baseline started on eval    Status New    Target Date 02/16/21      PT SHORT TERM GOAL #2   Title Pt will voice understanding of measures to assist in decreasing bilat hip and low back pain.    Status New    Target Date 02/16/21               PT Long Term Goals - 01/26/21 2153       PT LONG TERM GOAL #1   Title Pt will demonstrate 4/5 bilat hip strength to decrease pain and improve functional mobility    Status New    Target Date 04/01/21      PT LONG TERM GOAL #2   Title Pt will report a decrease in bilat hip and low back pain to 2/10 or less with daily activiites    Baseline 5/10    Period Weeks    Status New    Target Date 04/01/21      PT LONG TERM GOAL #3   Title Pt's FOTO score for function will increase tothe predicated value of 49%    Baseline 29%    Status New    Target Date 04/01/21      PT LONG TERM GOAL #4   Title Pt will be Ind in a final HEP to maintain pt's achieved LOF    Status New    Target Date 04/01/21                    Plan - 01/26/21 2109     Clinical Impression Statement Pt presents with signs and symptoms consistent with bilat greater trochanter. Pt also reports associated low back pain. Pt is TTP to the anterior aspect of the greater trochanters bilat. Both hips were found to be weak, Lt > Rt. See instruction and education sections for PT intervention. Pt will benefit from skilled PT 2w8 for lumboplelvic/hip flexibility and stregnthening  to decrease pain and optiize the pt's functional mobility.    Personal Factors and Comorbidities Age;Past/Current Experience;Time since onset of injury/illness/exacerbation;Comorbidity 2;Comorbidity 3+    Comorbidities anxiety, HTN, L4-5 fusion, L hip THA, ACDF C3-4, C4-5, high BMI     Examination-Activity Limitations Locomotion Level;Stairs;Stand;Sleep    Stability/Clinical Decision Making Stable/Uncomplicated    Clinical Decision Making Low    Rehab Potential Good    PT Frequency 2x / week    PT Duration 8 weeks    PT Treatment/Interventions ADLs/Self Care Home Management;Aquatic Therapy;Cryotherapy;Electrical Stimulation;Iontophoresis 4mg /ml Dexamethasone;Moist Heat;Traction;Balance training;Therapeutic exercise;Therapeutic activities;Stair training;Gait training;Functional mobility training;Manual techniques;Passive range of motion;Taping;Spinal Manipulations    PT Next Visit Plan Review and assess HEP. Assess 2 min walking test and set goal    PT Home Exercise Plan    Consulted and Agree with Plan of Care Patient             Patient will benefit from skilled therapeutic intervention in order to improve the following deficits and impairments:  Difficulty walking, Decreased activity tolerance, Postural dysfunction, Pain, Obesity  Visit Diagnosis: Greater trochanteric bursitis of both hips  Muscle weakness (generalized)  Difficulty in walking, not elsewhere classified     Problem List Patient Active Problem List   Diagnosis Date Noted   Hyperlipidemia 12/30/2020   Alpha thalassaemia minor 05/25/2020  Healthcare maintenance 05/25/2020   Acute gout due to renal impairment involving toe 05/20/2019   Neuropathy 05/18/2019   Primary localized osteoarthritis of left hip 11/18/2018   Primary osteoarthritis of left hip 11/18/2018   Microcytic anemia 08/13/2018   Preop examination 11/01/2017   Disc disease, degenerative, cervical 11/01/2017   Liver fibrosis 04/30/2017   NSAID long-term use 01/09/2016   Chronic back pain secondary to DJD of cervical, thoracic and lumbar spine and disc herniation 01/09/2016   Osteoarthritis of both knees 11/02/2011   DISORDER, DEPRESSIVE NEC 09/25/2006   HERPES LABIALIS 04/04/2006   Chronic hepatitis C without  hepatic coma (HCC) 02/22/2006   Essential hypertension 02/22/2006    Joellyn Rued MS, PT 01/26/21 10:06 PM  [facility] [address] [place], [place], [address] Phone: [phone]   Fax:  [phone]  Name: Greg Flowers MRN: [id number] Date of Birth: June 05, 1955

## 2021-02-06 ENCOUNTER — Telehealth: Payer: Self-pay | Admitting: *Deleted

## 2021-02-06 NOTE — Telephone Encounter (Signed)
Patient called in requesting refills on tizanidine and tramadol. States these were written by surgeon for hip and low back pain but they will no longer refill them. States he is taking gabapentin but it does not help. [facility] [address].

## 2021-02-07 ENCOUNTER — Ambulatory Visit: Payer: Federal, State, Local not specified - PPO

## 2021-02-07 ENCOUNTER — Other Ambulatory Visit: Payer: Self-pay

## 2021-02-07 DIAGNOSIS — M25652 Stiffness of left hip, not elsewhere classified: Secondary | ICD-10-CM

## 2021-02-07 DIAGNOSIS — M7061 Trochanteric bursitis, right hip: Secondary | ICD-10-CM

## 2021-02-07 DIAGNOSIS — R262 Difficulty in walking, not elsewhere classified: Secondary | ICD-10-CM

## 2021-02-07 DIAGNOSIS — M7062 Trochanteric bursitis, left hip: Secondary | ICD-10-CM

## 2021-02-07 DIAGNOSIS — M6281 Muscle weakness (generalized): Secondary | ICD-10-CM

## 2021-02-07 DIAGNOSIS — M25552 Pain in left hip: Secondary | ICD-10-CM

## 2021-02-07 NOTE — Therapy (Signed)
[facility] [address] [place], [place], [address] Phone: [phone]   Fax:  [phone]  Physical Therapy Treatment  Patient Details  Name: Greg Flowers MRN: [id number] Date of Birth: 1955-07-25 Referring Provider (PT): Estill Bamberg, MD   Encounter Date: 02/07/2021   PT End of Session - 02/07/21 1418     Visit Number 2    Number of Visits 17    Date for PT Re-Evaluation 04/01/21    Authorization Type BCBS/FEDERAL EMP PPO    PT Start Time 1418    PT Stop Time 1505    PT Time Calculation (min) 47 min    Activity Tolerance Patient tolerated treatment well    Behavior During Therapy [facility] for tasks assessed/performed             Past Medical History:  Diagnosis Date   Anxiety    Calculus, kidney 2000   Sees Dr. Isabel Caprice, urology.    Cervical spondylosis 2004   sp surgery by Dr. Danielle Dess   Depression    GERD (gastroesophageal reflux disease)    Headache(784.0)    Chronic, on vicodin 5 TID for this.    Hepatitis C    Has occasional visits with Dr. Randa Evens GI, failed RX in 2000.  Liver Biopsy 9/06: Minimally active hepatitis consistent with hepatitis C, minimal necroinflamtory activity grade 1, no incrased fibrosis stage 0.    Herpes labialis    History of kidney stones    Hypertension    Pneumonia    Renal stone    Spondylolisthesis of lumbar region    Thalassemia    HGB 9/09 14.4 wtih MCV 72.6   Wears glasses     Past Surgical History:  Procedure Laterality Date   ANTERIOR CERVICAL DECOMP/DISCECTOMY FUSION N/A 11/21/2017   Procedure: ANTERIOR CERVICAL DECOMPRESSION FUSION, CERVICAL THREE-FOUR, CERVICAL FOUR-FIVE WITH INSTRUMENTATION AND ALLOGRAFT;  Surgeon: Estill Bamberg, MD;  Location: [facility] OR;  Service: Orthopedics;  Laterality: N/A;  ANTERIOR CERVICAL DECOMPRESSION FUSION, CERVICAL THREE-FOUR, CERVICAL FOUR-FIVE WITH INSTRUMENTATION AND ALLOGRAFT   BACK SURGERY  2017   L4-L5 PLATE/SCREWS   CERVICAL  DISCECTOMY  2004   Dr Danielle Dess   COLONOSCOPY     LITHOTRIPSY  2004   Dr Logan Bores   NASAL SEPTUM SURGERY  2007   TONSILLECTOMY     TOTAL HIP ARTHROPLASTY Left 11/18/2018   Procedure: Left Anterior Hip Arthroplasty;  Surgeon: Marcene Corning, MD;  Location: [facility];  Service: Orthopedics;  Laterality: Left;    There were no vitals filed for this visit.   Subjective Assessment - 02/07/21 1423     Subjective Pt reports being consistent with completing his HEP. His hips are feeling better, while his low back is bothering a little more.    Patient Stated Goals Improve flexibility and decreaase the pain for his hips and low back    Currently in Pain? Yes    Pain Score 2     Pain Location Hip    Pain Orientation Right;Left    Pain Descriptors / Indicators Aching    Pain Type Chronic pain    Pain Onset More than a month ago    Pain Frequency Intermittent    Multiple Pain Sites Yes    Pain Score 5    Pain Location Back    Pain Descriptors / Indicators Aching;Sharp    Pain Type Chronic pain    Pain Onset More than a month ago    Pain Frequency Intermittent  Aggravating Factors  Waking up in the AM    Pain Relieving Factors heating pad            [facility] Adult PT Treatment/Exercise:  Therapeutic Exercise: - Figure 4 hip stretch,1x, 20 sec, L and R - Piriformis, 1x, 20 sce, L and R - Bridging, partial to limit lumbar extension, 15x - Posterior pelvic tilts, 15x,  - Hip abd clams, green Tband, 15x - Hip add sets, 15x, 5 sec  Warm up exs prior to getting out of bed in the AM: - SKTC, 1x, 20 sec, L and R - LTR, 4x, 5-10 sec - Marching 10x  Manual Therapy: - NA  Neuromuscular re-ed: - NA  Therapeutic Activity: - NA  Self-care/Home Management: - See education                             PT Education - 02/07/21 1625     Education Details Explained spinal vs. foraminal stenosis; Updated HEP for additional lumbopelvic strenthening and flexibility exs  and warm up exs prior to getting out of bed in the AM.    Person(s) Educated Patient    Methods Explanation;Demonstration    Comprehension Verbalized understanding;Returned demonstration;Verbal cues required;Tactile cues required              PT Short Term Goals - 01/26/21 2150       PT SHORT TERM GOAL #1   Title He will be independent with initial HEP    Baseline started on eval    Status New    Target Date 02/16/21      PT SHORT TERM GOAL #2   Title Pt will voice understanding of measures to assist in decreasing bilat hip and low back pain.    Status New    Target Date 02/16/21               PT Long Term Goals - 01/26/21 2153       PT LONG TERM GOAL #1   Title Pt will demonstrate 4/5 bilat hip strength to decrease pain and improve functional mobility    Status New    Target Date 04/01/21      PT LONG TERM GOAL #2   Title Pt will report a decrease in bilat hip and low back pain to 2/10 or less with daily activiites    Baseline 5/10    Period Weeks    Status New    Target Date 04/01/21      PT LONG TERM GOAL #3   Title Pt's FOTO score for function will increase tothe predicated value of 49%    Baseline 29%    Status New    Target Date 04/01/21      PT LONG TERM GOAL #4   Title Pt will be Ind in a final HEP to maintain pt's achieved LOF    Status New    Target Date 04/01/21                   Plan - 02/07/21 1419     Clinical Impression Statement Pt is resonding well to the initial HEP with overal improved pain. Pt is able to recall recommendations re: sleeping positions and support and his HEP. Pt's HEP was progressed with additional lumbopelvic flexibility and strengthening/stability. Pt reported improved low back pain following today's session. Pt will continue to benefit from skilled PT to address strength and flexibility deficits to decrease pai  and optimize function.    Personal Factors and Comorbidities Age;Past/Current Experience;Time  since onset of injury/illness/exacerbation;Comorbidity 2;Comorbidity 3+    Comorbidities anxiety, HTN, L4-5 fusion, L hip THA, ACDF C3-4, C4-5, high BMI    Examination-Activity Limitations Locomotion Level;Stairs;Stand;Sleep    Stability/Clinical Decision Making Stable/Uncomplicated    Clinical Decision Making Low    Rehab Potential Good    PT Frequency 2x / week    PT Duration 8 weeks    PT Treatment/Interventions ADLs/Self Care Home Management;Aquatic Therapy;Cryotherapy;Electrical Stimulation;Iontophoresis 4mg /ml Dexamethasone;Moist Heat;Traction;Balance training;Therapeutic exercise;Therapeutic activities;Stair training;Gait training;Functional mobility training;Manual techniques;Passive range of motion;Taping;Spinal Manipulations    PT Next Visit Plan Review and assess HEP. Assess 2 min walking test and set goal    PT Home Exercise Plan    Consulted and Agree with Plan of Care Patient             Patient will benefit from skilled therapeutic intervention in order to improve the following deficits and impairments:  Difficulty walking, Decreased activity tolerance, Postural dysfunction, Pain, Obesity  Visit Diagnosis: Greater trochanteric bursitis of both hips  Muscle weakness (generalized)  Pain in left hip  Stiffness of left hip, not elsewhere classified  Difficulty in walking, not elsewhere classified     Problem List Patient Active Problem List   Diagnosis Date Noted   Hyperlipidemia 12/30/2020   Alpha thalassaemia minor 05/25/2020   Healthcare maintenance 05/25/2020   Acute gout due to renal impairment involving toe 05/20/2019   Neuropathy 05/18/2019   Primary localized osteoarthritis of left hip 11/18/2018   Primary osteoarthritis of left hip 11/18/2018   Microcytic anemia 08/13/2018   Preop examination 11/01/2017   Disc disease, degenerative, cervical 11/01/2017   Liver fibrosis 04/30/2017   NSAID long-term use 01/09/2016   Chronic back pain  secondary to DJD of cervical, thoracic and lumbar spine and disc herniation 01/09/2016   Osteoarthritis of both knees 11/02/2011   DISORDER, DEPRESSIVE NEC 09/25/2006   HERPES LABIALIS 04/04/2006   Chronic hepatitis C without hepatic coma (HCC) 02/22/2006   Essential hypertension 02/22/2006    13/12/2005 MS, PT 02/07/21 4:45 PM   [facility] [address] [place], [place], [place] Phone: [phone]   Fax:  [phone]  Name: Greg Flowers MRN: Lorriane Shire Date of Birth: 1956-03-05

## 2021-02-09 ENCOUNTER — Other Ambulatory Visit: Payer: Self-pay | Admitting: Internal Medicine

## 2021-02-09 ENCOUNTER — Ambulatory Visit: Payer: Federal, State, Local not specified - PPO | Admitting: Physical Therapy

## 2021-02-09 ENCOUNTER — Other Ambulatory Visit: Payer: Self-pay

## 2021-02-09 ENCOUNTER — Encounter: Payer: Self-pay | Admitting: Physical Therapy

## 2021-02-09 DIAGNOSIS — M7061 Trochanteric bursitis, right hip: Secondary | ICD-10-CM | POA: Diagnosis not present

## 2021-02-09 DIAGNOSIS — R262 Difficulty in walking, not elsewhere classified: Secondary | ICD-10-CM

## 2021-02-09 DIAGNOSIS — M25552 Pain in left hip: Secondary | ICD-10-CM

## 2021-02-09 DIAGNOSIS — M25652 Stiffness of left hip, not elsewhere classified: Secondary | ICD-10-CM

## 2021-02-09 DIAGNOSIS — M7062 Trochanteric bursitis, left hip: Secondary | ICD-10-CM

## 2021-02-09 DIAGNOSIS — M6281 Muscle weakness (generalized): Secondary | ICD-10-CM

## 2021-02-09 DIAGNOSIS — M10379 Gout due to renal impairment, unspecified ankle and foot: Secondary | ICD-10-CM

## 2021-02-09 NOTE — Therapy (Deleted)
[facility] Outpatient [facility] [address] [place], [place], [address] Phone: [phone]   Fax:  [phone]  Physical Therapy Treatment  Patient Details  Name: Greg Flowers MRN: [id number] Date of Birth: 1956-03-30 Referring Provider (PT): Estill Bamberg, MD   Encounter Date: 02/09/2021   PT End of Session - 02/09/21 1300     Visit Number 3    Number of Visits 17    Date for PT Re-Evaluation 04/01/21    Authorization Type BCBS/FEDERAL EMP PPO    PT Start Time 1240    PT Stop Time 1330    PT Time Calculation (min) 50 min             Past Medical History:  Diagnosis Date   Anxiety    Calculus, kidney 2000   Sees Dr. Isabel Caprice, urology.    Cervical spondylosis 2004   sp surgery by Dr. Danielle Dess   Depression    GERD (gastroesophageal reflux disease)    Headache(784.0)    Chronic, on vicodin 5 TID for this.    Hepatitis C    Has occasional visits with Dr. Randa Evens GI, failed RX in 2000.  Liver Biopsy 9/06: Minimally active hepatitis consistent with hepatitis C, minimal necroinflamtory activity grade 1, no incrased fibrosis stage 0.    Herpes labialis    History of kidney stones    Hypertension    Pneumonia    Renal stone    Spondylolisthesis of lumbar region    Thalassemia    HGB 9/09 14.4 wtih MCV 72.6   Wears glasses     Past Surgical History:  Procedure Laterality Date   ANTERIOR CERVICAL DECOMP/DISCECTOMY FUSION N/A 11/21/2017   Procedure: ANTERIOR CERVICAL DECOMPRESSION FUSION, CERVICAL THREE-FOUR, CERVICAL FOUR-FIVE WITH INSTRUMENTATION AND ALLOGRAFT;  Surgeon: Estill Bamberg, MD;  Location: [facility] OR;  Service: Orthopedics;  Laterality: N/A;  ANTERIOR CERVICAL DECOMPRESSION FUSION, CERVICAL THREE-FOUR, CERVICAL FOUR-FIVE WITH INSTRUMENTATION AND ALLOGRAFT   BACK SURGERY  2017   L4-L5 PLATE/SCREWS   CERVICAL DISCECTOMY  2004   Dr Danielle Dess   COLONOSCOPY     LITHOTRIPSY  2004   Dr Logan Bores   NASAL SEPTUM SURGERY  2007    TONSILLECTOMY     TOTAL HIP ARTHROPLASTY Left 11/18/2018   Procedure: Left Anterior Hip Arthroplasty;  Surgeon: Marcene Corning, MD;  Location: [facility];  Service: Orthopedics;  Laterality: Left;    There were no vitals filed for this visit.   Subjective Assessment - 02/09/21 1243     Subjective I am more sore getting into the exercises again. I am having more low back pain.    Currently in Pain? Yes    Pain Score 7    Pain Location Back    Pain Orientation Lower    Pain Descriptors / Indicators Sharp    Pain Type Chronic pain    Aggravating Factors  first waking    Pain Relieving Factors heating pad                [facility] PT Assessment - 02/09/21 0001       Ambulation/Gait   Gait Comments 359 feet      6 Minute walk- Post Test   6 Minute Walk Post Test yes                 PT Education - 02/09/21 1324     Education Details TENS unit    Person(s) Educated Patient    Methods Explanation;Handout    Comprehension  Verbalized understanding              PT Short Term Goals - 01/26/21 2150       PT SHORT TERM GOAL #1   Title He will be independent with initial HEP    Baseline started on eval    Status New    Target Date 02/16/21      PT SHORT TERM GOAL #2   Title Pt will voice understanding of measures to assist in decreasing bilat hip and low back pain.    Status New    Target Date 02/16/21               PT Long Term Goals - 01/26/21 2153       PT LONG TERM GOAL #1   Title Pt will demonstrate 4/5 bilat hip strength to decrease pain and improve functional mobility    Status New    Target Date 04/01/21      PT LONG TERM GOAL #2   Title Pt will report a decrease in bilat hip and low back pain to 2/10 or less with daily activiites    Baseline 5/10    Period Weeks    Status New    Target Date 04/01/21      PT LONG TERM GOAL #3   Title Pt's FOTO score for function will increase tothe predicated value of 49%    Baseline 29%    Status  New    Target Date 04/01/21      PT LONG TERM GOAL #4   Title Pt will be Ind in a final HEP to maintain pt's achieved LOF    Status New    Target Date 04/01/21                   Plan - 02/09/21 1309     Clinical Impression Statement Jheremy reports pain intense this morning and currently 7/10. Reviewed HEP and trialed IFC today for pain relief. Education provided on pain reduction stratgies including ice, heat, tens, HEP modification as needed. Pt reported decreased pain after session and was given TENS information.    PT Treatment/Interventions ADLs/Self Care Home Management;Aquatic Therapy;Cryotherapy;Electrical Stimulation;Iontophoresis 4mg /ml Dexamethasone;Moist Heat;Traction;Balance training;Therapeutic exercise;Therapeutic activities;Stair training;Gait training;Functional mobility training;Manual techniques;Passive range of motion;Taping;Spinal Manipulations    PT Next Visit Plan Review and assess HEP. Assess 2 min walking test (359 ft)- set goal    PT Home Exercise Plan    Consulted and Agree with Plan of Care Patient             Patient will benefit from skilled therapeutic intervention in order to improve the following deficits and impairments:  Difficulty walking, Decreased activity tolerance, Postural dysfunction, Pain, Obesity  Visit Diagnosis: Greater trochanteric bursitis of both hips  Muscle weakness (generalized)  Pain in left hip  Stiffness of left hip, not elsewhere classified  Difficulty in walking, not elsewhere classified     Problem List Patient Active Problem List   Diagnosis Date Noted   Hyperlipidemia 12/30/2020   Alpha thalassaemia minor 05/25/2020   Healthcare maintenance 05/25/2020   Acute gout due to renal impairment involving toe 05/20/2019   Neuropathy 05/18/2019   Primary localized osteoarthritis of left hip 11/18/2018   Primary osteoarthritis of left hip 11/18/2018   Microcytic anemia 08/13/2018   Preop examination  11/01/2017   Disc disease, degenerative, cervical 11/01/2017   Liver fibrosis 04/30/2017   NSAID long-term use 01/09/2016   Chronic back pain secondary to DJD  of cervical, thoracic and lumbar spine and disc herniation 01/09/2016   Osteoarthritis of both knees 11/02/2011   DISORDER, DEPRESSIVE NEC 09/25/2006   HERPES LABIALIS 04/04/2006   Chronic hepatitis C without hepatic coma (HCC) 02/22/2006   Essential hypertension 02/22/2006    Sherrie Mustache, PTA 02/09/2021, 1:28 PM  [facility] [address] [place], [place], [address] Phone: [phone]   Fax:  [phone]  Name: Greg Flowers MRN: [id number] Date of Birth: Aug 26, 1955

## 2021-02-09 NOTE — Therapy (Signed)
[facility] Outpatient Rehabilitation [facility] [address] [place], [place], [address] Phone: [phone]   Fax:  [phone]  Physical Therapy Treatment  Patient Details  Name: Greg Flowers MRN: [id number] Date of Birth: [birth year] Referring Provider (PT): Estill Bamberg, MD   Encounter Date: 02/09/2021   PT End of Session - 02/09/21 1300     Visit Number 3    Number of Visits 17    Date for PT Re-Evaluation 04/01/21    Authorization Type BCBS/FEDERAL EMP PPO    PT Start Time 1240    PT Stop Time 1330    PT Time Calculation (min) 50 min             Past Medical History:  Diagnosis Date   Anxiety    Calculus, kidney 2000   Sees Dr. Isabel Caprice, urology.    Cervical spondylosis 2004   sp surgery by Dr. Danielle Dess   Depression    GERD (gastroesophageal reflux disease)    Headache(784.0)    Chronic, on vicodin 5 TID for this.    Hepatitis C    Has occasional visits with Dr. Randa Evens GI, failed RX in 2000.  Liver Biopsy 9/06: Minimally active hepatitis consistent with hepatitis C, minimal necroinflamtory activity grade 1, no incrased fibrosis stage 0.    Herpes labialis    History of kidney stones    Hypertension    Pneumonia    Renal stone    Spondylolisthesis of lumbar region    Thalassemia    HGB 9/09 14.4 wtih MCV 72.6   Wears glasses     Past Surgical History:  Procedure Laterality Date   ANTERIOR CERVICAL DECOMP/DISCECTOMY FUSION N/A 11/21/2017   Procedure: ANTERIOR CERVICAL DECOMPRESSION FUSION, CERVICAL THREE-FOUR, CERVICAL FOUR-FIVE WITH INSTRUMENTATION AND ALLOGRAFT;  Surgeon: Estill Bamberg, MD;  Location: [facility];  Service: Orthopedics;  Laterality: N/A;  ANTERIOR CERVICAL DECOMPRESSION FUSION, CERVICAL THREE-FOUR, CERVICAL FOUR-FIVE WITH INSTRUMENTATION AND ALLOGRAFT   BACK SURGERY  2017   L4-L5 PLATE/SCREWS   CERVICAL DISCECTOMY  2004   Dr Danielle Dess   COLONOSCOPY     LITHOTRIPSY  2004   Dr Logan Bores   NASAL SEPTUM SURGERY  2007    TONSILLECTOMY     TOTAL HIP ARTHROPLASTY Left 11/18/2018   Procedure: Left Anterior Hip Arthroplasty;  Surgeon: Marcene Corning, MD;  Location: [facility];  Service: Orthopedics;  Laterality: Left;    There were no vitals filed for this visit.   Subjective Assessment - 02/09/21 1243     Subjective I am more sore getting into the exercises again. I am having more low back pain.    Currently in Pain? Yes    Pain Score 7    Pain Location Back    Pain Orientation Lower    Pain Descriptors / Indicators Sharp    Pain Type Chronic pain    Aggravating Factors  first waking    Pain Relieving Factors heating pad                [facility] PT Assessment - 02/09/21 0001       Ambulation/Gait   Gait Comments 359 feet      6 Minute walk- Post Test   6 Minute Walk Post Test yes                           [facility] Adult PT Treatment/Exercise - 02/09/21 0001       Exercises   Exercises Other  Exercises    Other Exercises  HEP Review: pelvic tilit, bridge, SKTC, LTR, Supine marching, ball squeeze clam with green band , piriformis stretches push and pull: all 2 sets of 10 reps except stretches 3 x 30 sec      Modalities   Modalities Electrical Stimulation;Moist Heat      Moist Heat Therapy   Number Minutes Moist Heat 15 Minutes    Moist Heat Location Lumbar Spine      Electrical Stimulation   Electrical Stimulation Location lower back    Electrical Stimulation Action IFC x 15 min    Electrical Stimulation Parameters 14 mA    Electrical Stimulation Goals Pain                     PT Education - 02/09/21 1324     Education Details TEND unit    Person(s) Educated Patient    Methods Explanation;Handout    Comprehension Verbalized understanding              PT Short Term Goals - 01/26/21 2150       PT SHORT TERM GOAL #1   Title He will be independent with initial HEP    Baseline started on eval    Status New    Target Date 02/16/21      PT SHORT  TERM GOAL #2   Title Pt will voice understanding of measures to assist in decreasing bilat hip and low back pain.    Status New    Target Date 02/16/21               PT Long Term Goals - 01/26/21 2153       PT LONG TERM GOAL #1   Title Pt will demonstrate 4/5 bilat hip strength to decrease pain and improve functional mobility    Status New    Target Date 04/01/21      PT LONG TERM GOAL #2   Title Pt will report a decrease in bilat hip and low back pain to 2/10 or less with daily activiites    Baseline 5/10    Period Weeks    Status New    Target Date 04/01/21      PT LONG TERM GOAL #3   Title Pt's FOTO score for function will increase tothe predicated value of 49%    Baseline 29%    Status New    Target Date 04/01/21      PT LONG TERM GOAL #4   Title Pt will be Ind in a final HEP to maintain pt's achieved LOF    Status New    Target Date 04/01/21                   Plan - 02/09/21 1309     Clinical Impression Statement Greg Flowers reports pain intense this morning and currently 7/10. Reviewed HEP and trialed IFC today for pain relief. Education provided on pain reduction stratgies including ice, heat, tens, HEP modification as needed. Pt reported decreased pain after session and was given TENS information.    PT Treatment/Interventions ADLs/Self Care Home Management;Aquatic Therapy;Cryotherapy;Electrical Stimulation;Iontophoresis 4mg /ml Dexamethasone;Moist Heat;Traction;Balance training;Therapeutic exercise;Therapeutic activities;Stair training;Gait training;Functional mobility training;Manual techniques;Passive range of motion;Taping;Spinal Manipulations    PT Next Visit Plan Review and assess HEP. Assess 2 min walking test (359 ft)- set goal    PT Home Exercise Plan    Consulted and Agree with Plan of Care Patient  Patient will benefit from skilled therapeutic intervention in order to improve the following deficits and impairments:   Difficulty walking, Decreased activity tolerance, Postural dysfunction, Pain, Obesity  Visit Diagnosis: Greater trochanteric bursitis of both hips  Muscle weakness (generalized)  Pain in left hip  Stiffness of left hip, not elsewhere classified  Difficulty in walking, not elsewhere classified     Problem List Patient Active Problem List   Diagnosis Date Noted   Hyperlipidemia 12/30/2020   Alpha thalassaemia minor 05/25/2020   Healthcare maintenance 05/25/2020   Acute gout due to renal impairment involving toe 05/20/2019   Neuropathy 05/18/2019   Primary localized osteoarthritis of left hip 11/18/2018   Primary osteoarthritis of left hip 11/18/2018   Microcytic anemia 08/13/2018   Preop examination 11/01/2017   Disc disease, degenerative, cervical 11/01/2017   Liver fibrosis 04/30/2017   NSAID long-term use 01/09/2016   Chronic back pain secondary to DJD of cervical, thoracic and lumbar spine and disc herniation 01/09/2016   Osteoarthritis of both knees 11/02/2011   DISORDER, DEPRESSIVE NEC 09/25/2006   HERPES LABIALIS 04/04/2006   Chronic hepatitis C without hepatic coma (HCC) 02/22/2006   Essential hypertension 02/22/2006    Sherrie Mustache, PTA 02/09/2021, 1:32 PM  [facility] [address] [place], [place], [address] Phone: [phone]   Fax:  [phone]  Name: Greg Flowers MRN: [id number] Date of Birth: Feb 10, 1956

## 2021-02-09 NOTE — Patient Instructions (Signed)

## 2021-02-14 ENCOUNTER — Other Ambulatory Visit: Payer: Self-pay | Admitting: Internal Medicine

## 2021-02-14 DIAGNOSIS — I10 Essential (primary) hypertension: Secondary | ICD-10-CM

## 2021-02-21 ENCOUNTER — Other Ambulatory Visit: Payer: Self-pay

## 2021-02-21 ENCOUNTER — Ambulatory Visit: Payer: Federal, State, Local not specified - PPO | Attending: Orthopedic Surgery | Admitting: Physical Therapy

## 2021-02-21 DIAGNOSIS — M25652 Stiffness of left hip, not elsewhere classified: Secondary | ICD-10-CM

## 2021-02-21 DIAGNOSIS — M25552 Pain in left hip: Secondary | ICD-10-CM

## 2021-02-21 DIAGNOSIS — M7062 Trochanteric bursitis, left hip: Secondary | ICD-10-CM | POA: Insufficient documentation

## 2021-02-21 DIAGNOSIS — R262 Difficulty in walking, not elsewhere classified: Secondary | ICD-10-CM

## 2021-02-21 DIAGNOSIS — M7061 Trochanteric bursitis, right hip: Secondary | ICD-10-CM

## 2021-02-21 DIAGNOSIS — M6281 Muscle weakness (generalized): Secondary | ICD-10-CM | POA: Diagnosis present

## 2021-02-21 NOTE — Therapy (Signed)
[facility] [address] [place], [place], [address] Phone: [phone]   Fax:  [phone]  Physical Therapy Treatment  Patient Details  Name: Greg Flowers MRN: [id number] Date of Birth: 02/12/1956 Referring Provider (PT): Estill Bamberg, MD   Encounter Date: 02/21/2021   PT End of Session - 02/21/21 1324     Visit Number 4    Number of Visits 17    Date for PT Re-Evaluation 04/01/21    Authorization Type BCBS/FEDERAL EMP PPO    PT Start Time 1315    PT Stop Time 1400    PT Time Calculation (min) 45 min             Past Medical History:  Diagnosis Date   Anxiety    Calculus, kidney 2000   Sees Dr. Isabel Caprice, urology.    Cervical spondylosis 2004   sp surgery by Dr. Danielle Dess   Depression    GERD (gastroesophageal reflux disease)    Headache(784.0)    Chronic, on vicodin 5 TID for this.    Hepatitis C    Has occasional visits with Dr. Randa Evens GI, failed RX in 2000.  Liver Biopsy 9/06: Minimally active hepatitis consistent with hepatitis C, minimal necroinflamtory activity grade 1, no incrased fibrosis stage 0.    Herpes labialis    History of kidney stones    Hypertension    Pneumonia    Renal stone    Spondylolisthesis of lumbar region    Thalassemia    HGB 9/09 14.4 wtih MCV 72.6   Wears glasses     Past Surgical History:  Procedure Laterality Date   ANTERIOR CERVICAL DECOMP/DISCECTOMY FUSION N/A 11/21/2017   Procedure: ANTERIOR CERVICAL DECOMPRESSION FUSION, CERVICAL THREE-FOUR, CERVICAL FOUR-FIVE WITH INSTRUMENTATION AND ALLOGRAFT;  Surgeon: Estill Bamberg, MD;  Location: [facility] OR;  Service: Orthopedics;  Laterality: N/A;  ANTERIOR CERVICAL DECOMPRESSION FUSION, CERVICAL THREE-FOUR, CERVICAL FOUR-FIVE WITH INSTRUMENTATION AND ALLOGRAFT   BACK SURGERY  2017   L4-L5 PLATE/SCREWS   CERVICAL DISCECTOMY  2004   Dr Danielle Dess   COLONOSCOPY     LITHOTRIPSY  2004   Dr Logan Bores   NASAL SEPTUM SURGERY  2007   TONSILLECTOMY      TOTAL HIP ARTHROPLASTY Left 11/18/2018   Procedure: Left Anterior Hip Arthroplasty;  Surgeon: Marcene Corning, MD;  Location: [facility] ORS;  Service: Orthopedics;  Laterality: Left;    There were no vitals filed for this visit.   Subjective Assessment - 02/21/21 1322     Subjective Pt reports pain is 10/10 in the mornings. 5/10 now.    Currently in Pain? Yes    Pain Score 0-No pain    Pain Location Hip    Pain Orientation --    Pain Descriptors / Indicators --    Pain Type --    Aggravating Factors  --    Pain Relieving Factors --    Pain Score 5    Pain Location Back    Pain Orientation Lower    Pain Descriptors / Indicators Aching    Pain Type Chronic pain    Aggravating Factors  first wake up in the morning increases back pain. roll over to hip    Pain Relieving Factors heat, meds                               [facility] Adult PT Treatment/Exercise - 02/21/21 0001       Exercises  Other Exercises  Leg press 40# x 20 , hooklying abdominal isometric into physioball, also obliques x15 each, SKTC, bridge ith blue clam-small ROM, blue side clam x 15 each  Nustep L 5 UE/LE x 7 min     Modalities   Modalities Cryotherapy      Cryotherapy   Number Minutes Cryotherapy 10 Minutes    Cryotherapy Location Lumbar Spine    Type of Cryotherapy Ice pack      Electrical Stimulation   Electrical Stimulation Location lower back    Electrical Stimulation Action IFC x 15 min    Electrical Stimulation Parameters 15 mA    Electrical Stimulation Goals Pain                       PT Short Term Goals - 01/26/21 2150       PT SHORT TERM GOAL #1   Title He will be independent with initial HEP    Baseline started on eval    Status New    Target Date 02/16/21      PT SHORT TERM GOAL #2   Title Pt will voice understanding of measures to assist in decreasing bilat hip and low back pain.    Status New    Target Date 02/16/21               PT Long Term  Goals - 01/26/21 2153       PT LONG TERM GOAL #1   Title Pt will demonstrate 4/5 bilat hip strength to decrease pain and improve functional mobility    Status New    Target Date 04/01/21      PT LONG TERM GOAL #2   Title Pt will report a decrease in bilat hip and low back pain to 2/10 or less with daily activiites    Baseline 5/10    Period Weeks    Status New    Target Date 04/01/21      PT LONG TERM GOAL #3   Title Pt's FOTO score for function will increase tothe predicated value of 49%    Baseline 29%    Status New    Target Date 04/01/21      PT LONG TERM GOAL #4   Title Pt will be Ind in a final HEP to maintain pt's achieved LOF    Status New    Target Date 04/01/21                   Plan - 02/21/21 1407     Clinical Impression Statement Enri reports IFC was helpful and he ordered a TENS unit for home. Hips intermittently tender with rolling in bed however low back pain is 10/10 every morning. He is working MD for next steps. Trial of ice today with IFC to see if beneficial for low back pain. He reports compliance with HEP most days without relief of back pain thus far.    PT Next Visit Plan Review and assess HEP. Assess 2 min walking test (359 ft)- set goal; asses cryotherapy response; did he get TENS? continue IFC prn    PT Home Exercise Plan [id number]             Patient will benefit from skilled therapeutic intervention in order to improve the following deficits and impairments:  Difficulty walking, Decreased activity tolerance, Postural dysfunction, Pain, Obesity  Visit Diagnosis: Greater trochanteric bursitis of both hips  Muscle weakness (generalized)  Stiffness of left hip,  not elsewhere classified  Pain in left hip  Difficulty in walking, not elsewhere classified     Problem List Patient Active Problem List   Diagnosis Date Noted   Hyperlipidemia 12/30/2020   Alpha thalassaemia minor 05/25/2020   Healthcare maintenance 05/25/2020    Acute gout due to renal impairment involving toe 05/20/2019   Neuropathy 05/18/2019   Primary localized osteoarthritis of left hip 11/18/2018   Primary osteoarthritis of left hip 11/18/2018   Microcytic anemia 08/13/2018   Preop examination 11/01/2017   Disc disease, degenerative, cervical 11/01/2017   Liver fibrosis 04/30/2017   NSAID long-term use 01/09/2016   Chronic back pain secondary to DJD of cervical, thoracic and lumbar spine and disc herniation 01/09/2016   Osteoarthritis of both knees 11/02/2011   DISORDER, DEPRESSIVE NEC 09/25/2006   HERPES LABIALIS 04/04/2006   Chronic hepatitis C without hepatic coma ([place]) 02/22/2006   Essential hypertension 02/22/2006    Dorene Ar, PTA 02/21/2021, 2:10 PM  [facility] [address] [place], [place], [address] Phone: [phone]   Fax:  [phone]  Name: BRYCE GILLMORE MRN: [id number] Date of Birth: 1955/10/26

## 2021-02-22 ENCOUNTER — Encounter: Payer: Self-pay | Admitting: Physical Therapy

## 2021-02-22 ENCOUNTER — Ambulatory Visit (INDEPENDENT_AMBULATORY_CARE_PROVIDER_SITE_OTHER): Payer: Federal, State, Local not specified - PPO | Admitting: Internal Medicine

## 2021-02-22 VITALS — BP 141/92 | HR 98 | Temp 98.0°F | Ht 68.0 in | Wt 202.9 lb

## 2021-02-22 DIAGNOSIS — G8929 Other chronic pain: Secondary | ICD-10-CM

## 2021-02-22 DIAGNOSIS — M47813 Spondylosis without myelopathy or radiculopathy, cervicothoracic region: Secondary | ICD-10-CM | POA: Diagnosis not present

## 2021-02-22 DIAGNOSIS — E782 Mixed hyperlipidemia: Secondary | ICD-10-CM

## 2021-02-22 DIAGNOSIS — M47816 Spondylosis without myelopathy or radiculopathy, lumbar region: Secondary | ICD-10-CM

## 2021-02-22 DIAGNOSIS — M545 Low back pain, unspecified: Secondary | ICD-10-CM

## 2021-02-22 DIAGNOSIS — I10 Essential (primary) hypertension: Secondary | ICD-10-CM

## 2021-02-22 MED ORDER — DULOXETINE HCL 30 MG PO CPEP: 30.0000 mg | ORAL_CAPSULE | Freq: Every day | ORAL | 1 refills | Status: DC

## 2021-02-22 MED ORDER — ATENOLOL 50 MG PO TABS: ORAL_TABLET | ORAL | 2 refills | Status: DC

## 2021-02-22 NOTE — Assessment & Plan Note (Signed)
The patient is here today for repeat lipid panel.  He was recently started on atorvastatin 10 mg daily.  At the end of the visit, patient left the clinic prior to obtaining labs stating that he was "upset at this time and needed to leave."

## 2021-02-22 NOTE — Assessment & Plan Note (Addendum)
Blood pressure of 141/92 today. At the end of the visit, while attempting to repeat BP, the patient left the clinic stating that he was "upset at this time and needed to leave." -Continue current management: Amlodipine 10 mg daily, HCTZ 25 mg daily, losartan 50 mg daily, atenolol 50 mg, until next follow-up.

## 2021-02-22 NOTE — Progress Notes (Signed)
   CC: back pain, repeat lipid panel, routine evaluation  HPI:  [name] is a 65 y.o. with a PMHx as below who presents to the clinic today with CC of back pain and routine clinical evaluation. Please see problem-based list for further details, assessments, and plans.   Past Medical History:  Diagnosis Date   Anxiety    Calculus, kidney 2000   Sees Dr. Isabel Caprice, urology.    Cervical spondylosis 2004   sp surgery by Dr. Danielle Dess   Depression    GERD (gastroesophageal reflux disease)    Headache(784.0)    Chronic, on vicodin 5 TID for this.    Hepatitis C    Has occasional visits with Dr. Randa Evens GI, failed RX in 2000.  Liver Biopsy 9/06: Minimally active hepatitis consistent with hepatitis C, minimal necroinflamtory activity grade 1, no incrased fibrosis stage 0.    Herpes labialis    History of kidney stones    Hypertension    Pneumonia    Renal stone    Spondylolisthesis of lumbar region    Thalassemia    HGB 9/09 14.4 wtih MCV 72.6   Wears glasses    Review of Systems:  Review of Systems  Constitutional: Negative.   HENT: Negative.    Eyes: Negative.   Respiratory: Negative.    Cardiovascular: Negative.   Gastrointestinal: Negative.   Genitourinary: Negative.   Musculoskeletal:  Positive for back pain.  Skin: Negative.   Neurological: Negative.   Endo/Heme/Allergies: Negative.   Psychiatric/Behavioral: Negative.      Physical Exam:  Vitals:   02/22/21 1354  BP: (!) 141/92  Pulse: 98  Temp: 98 F (36.7 C)  TempSrc: Oral  SpO2: 97%  Weight: 202 lb 14.4 oz (92 kg)  Height: 5\' 8"  (1.727 m)   Physical Exam Constitutional:      General: He is not in acute distress.    Appearance: Normal appearance.  HENT:     Head: Normocephalic and atraumatic.  Eyes:     Extraocular Movements: Extraocular movements intact.  Cardiovascular:     Rate and Rhythm: Normal rate and regular rhythm.     Heart sounds: No murmur heard.   No friction rub. No gallop.   Pulmonary:     Effort: Pulmonary effort is normal. No respiratory distress.     Breath sounds: Normal breath sounds. No wheezing, rhonchi or rales.  Abdominal:     General: Abdomen is flat. There is no distension.     Palpations: Abdomen is soft.  Musculoskeletal:     Lumbar back: Tenderness present.     Comments: TTP over left paraspinal area. Exam overall limited in the setting of pain.   Skin:    General: Skin is warm and dry.  Neurological:     General: No focal deficit present.     Mental Status: He is alert and oriented to person, place, and time. Mental status is at baseline.     Sensory: No sensory deficit.  Psychiatric:        Mood and Affect: Mood normal.        Behavior: Behavior normal.     Assessment & Plan:   See Encounters Tab for problem based charting.  Patient seen with Dr. 

## 2021-02-22 NOTE — Patient Instructions (Signed)
Thank you, Greg Flowers for allowing [place] to provide your care today. Today we discussed your back pain and cholesterol.  1) We are prescribing you a new medication called duloxetine or cymbalta. Please take this once daily for your back pain. You will take 30 mg once daily for one week, and then the next week you will take 60 mg once daily for 2 months. Follow-up with [place] in 3 months to discuss this further.   2) You were put on atorvastatin last time you were here. We are getting a repeat lipid panel to check your cholesterol today.    I have ordered the following labs for you:  Lab Orders         Lipid Profile        Referrals ordered today:   Referral Orders  No referral(s) requested today     I have ordered the following medication/changed the following medications:   Stop the following medications: Medications Discontinued During This Encounter  Medication Reason   celecoxib (CELEBREX) 200 MG capsule    predniSONE (STERAPRED UNI-PAK 21 TAB) 10 MG (21) TBPK tablet    atenolol (TENORMIN) 50 MG tablet Reorder     Start the following medications: Meds ordered this encounter  Medications   atenolol (TENORMIN) 50 MG tablet    Sig: TAKE 1 TABLET BY MOUTH EVERY DAY    Dispense:  90 tablet    Refill:  2     Follow up: 3 months    Should you have any questions or concerns please call the internal medicine clinic at [phone].

## 2021-02-22 NOTE — Assessment & Plan Note (Addendum)
Patient has a history of chronic back pain secondary to degenerative joint disease in the cervical and lumbar spine status post ACDF of C3-C4 and C4-C5.  He is receiving gabapentin 600 mg 3 times daily up from twice daily to help with his pain.  Conservative measures were also employed including heating pad and stretching exercises as tolerated.    He rates his pain today as 8/10, which he describes as sharp, located in the lower back.  He reports that heat and ice has helped take the edge off.    He reports that he was given a total of 2 months of tramadol from his orthopedic surgeon, but he has run out of this medication and is requesting more today.  P: We discussed that first-line treatment for his back pain includes duloxetine. Patient states that he has been put on this in the past by his psychiatrist but is unsure if this helped his pain. He was upset that we were not able to attempt pain control with tramadol and left the clinic prior to obtaining repeat blood pressure and lipid panel. Prior to leaving, he said that "if Cymbalta doesn't work then I will try to get medical marijuana somewhere else, and if that doesn't work, I will go to the street corner and get some pot."    -Start Cymbalta 30 mg daily for 1 week, followed by 60 mg afterwards Follow-up in 3 months.

## 2021-02-23 ENCOUNTER — Other Ambulatory Visit: Payer: Self-pay

## 2021-02-23 ENCOUNTER — Ambulatory Visit: Payer: Federal, State, Local not specified - PPO

## 2021-02-23 DIAGNOSIS — M6281 Muscle weakness (generalized): Secondary | ICD-10-CM

## 2021-02-23 DIAGNOSIS — M7061 Trochanteric bursitis, right hip: Secondary | ICD-10-CM | POA: Diagnosis not present

## 2021-02-23 DIAGNOSIS — R262 Difficulty in walking, not elsewhere classified: Secondary | ICD-10-CM

## 2021-02-23 DIAGNOSIS — M25552 Pain in left hip: Secondary | ICD-10-CM

## 2021-02-23 DIAGNOSIS — M25652 Stiffness of left hip, not elsewhere classified: Secondary | ICD-10-CM

## 2021-02-23 NOTE — Therapy (Signed)
[facility] [address] [place], [place], [address] Phone: [phone]   Fax:  [phone]  Physical Therapy Treatment  Patient Details  Name: Greg Flowers MRN: [id number] Date of Birth: 08/01/1955 Referring Provider (PT): Estill Bamberg, MD   Encounter Date: 02/23/2021   PT End of Session - 02/23/21 1422     Visit Number 5    Number of Visits 17    Date for PT Re-Evaluation 04/01/21    Authorization Type BCBS/FEDERAL EMP PPO    PT Start Time 1522    PT Stop Time 1602    PT Time Calculation (min) 40 min    Activity Tolerance Patient tolerated treatment well    Behavior During Therapy [facility] [place] for tasks assessed/performed             Past Medical History:  Diagnosis Date   Anxiety    Calculus, kidney 2000   Sees Dr. Isabel Caprice, urology.    Cervical spondylosis 2004   sp surgery by Dr. Danielle Dess   Depression    GERD (gastroesophageal reflux disease)    Headache(784.0)    Chronic, on vicodin 5 TID for this.    Hepatitis C    Has occasional visits with Dr. Randa Evens GI, failed RX in 2000.  Liver Biopsy 9/06: Minimally active hepatitis consistent with hepatitis C, minimal necroinflamtory activity grade 1, no incrased fibrosis stage 0.    Herpes labialis    History of kidney stones    Hypertension    Pneumonia    Renal stone    Spondylolisthesis of lumbar region    Thalassemia    HGB 9/09 14.4 wtih MCV 72.6   Wears glasses     Past Surgical History:  Procedure Laterality Date   ANTERIOR CERVICAL DECOMP/DISCECTOMY FUSION N/A 11/21/2017   Procedure: ANTERIOR CERVICAL DECOMPRESSION FUSION, CERVICAL THREE-FOUR, CERVICAL FOUR-FIVE WITH INSTRUMENTATION AND ALLOGRAFT;  Surgeon: Estill Bamberg, MD;  Location: [facility] OR;  Service: Orthopedics;  Laterality: N/A;  ANTERIOR CERVICAL DECOMPRESSION FUSION, CERVICAL THREE-FOUR, CERVICAL FOUR-FIVE WITH INSTRUMENTATION AND ALLOGRAFT   BACK SURGERY  2017   L4-L5 PLATE/SCREWS   CERVICAL  DISCECTOMY  2004   Dr Danielle Dess   COLONOSCOPY     LITHOTRIPSY  2004   Dr Logan Bores   NASAL SEPTUM SURGERY  2007   TONSILLECTOMY     TOTAL HIP ARTHROPLASTY Left 11/18/2018   Procedure: Left Anterior Hip Arthroplasty;  Surgeon: Marcene Corning, MD;  Location: [facility];  Service: Orthopedics;  Laterality: Left;    There were no vitals filed for this visit.   Subjective Assessment - 02/23/21 1428     Subjective Pt reports blowing, raking, and picking out leaves yesterday so he is more sore. Pt states he feels good that he was able to be active and productive. Pt he may receive an injection for his low back.    Patient Stated Goals Improve flexibility and decreaase the pain for his hips and low back    Currently in Pain? Yes    Pain Score 10-Worst pain ever    Pain Location Back    Pain Orientation Right;Left    Pain Descriptors / Indicators Aching    Pain Type Chronic pain    Pain Onset More than a month ago    Pain Frequency Intermittent    Pain Score 8    Pain Location Hip    Pain Orientation Right;Left    Pain Descriptors / Indicators Aching    Pain Onset More than a month ago  Pain Frequency Intermittent                               [facility] Adult PT Treatment/Exercise - 02/23/21 0001       Exercises   Exercises Other Exercises    Other Exercises  hooklying abdominal isometric into physioball; also obliques x15 each, SKTC 2x 15". bridging x10, 5 sec; HL clams each      Lumbar Exercises: Aerobic   Nustep 5', L4, UE/LE      Modalities   Modalities Cryotherapy      Cryotherapy   Number Minutes Cryotherapy 10 Minutes    Cryotherapy Location Lumbar Spine    Type of Cryotherapy Ice pack      Electrical Stimulation   Electrical Stimulation Location lower back    Electrical Stimulation Action IFC x 15 min    Electrical Stimulation Parameters 15 mA    Electrical Stimulation Goals Pain                       PT Short Term Goals - 01/26/21 2150        PT SHORT TERM GOAL #1   Title He will be independent with initial HEP    Baseline started on eval    Status New    Target Date 02/16/21      PT SHORT TERM GOAL #2   Title Pt will voice understanding of measures to assist in decreasing bilat hip and low back pain.    Status New    Target Date 02/16/21               PT Long Term Goals - 01/26/21 2153       PT LONG TERM GOAL #1   Title Pt will demonstrate 4/5 bilat hip strength to decrease pain and improve functional mobility    Status New    Target Date 04/01/21      PT LONG TERM GOAL #2   Title Pt will report a decrease in bilat hip and low back pain to 2/10 or less with daily activiites    Baseline 5/10    Period Weeks    Status New    Target Date 04/01/21      PT LONG TERM GOAL #3   Title Pt's FOTO score for function will increase tothe predicated value of 49%    Baseline 29%    Status New    Target Date 04/01/21      PT LONG TERM GOAL #4   Title Pt will be Ind in a final HEP to maintain pt's achieved LOF    Status New    Target Date 04/01/21                   Plan - 02/23/21 2226     Clinical Impression Statement After blowing, raking and picking up leaves from his yard yesterday, pt reports his low back and hips are hurting more. PT completed lumbopelvic flexibility and strengthening. After ther ex, a cold pack and IFC treatment was provided to the low back. After the session, pt repoted his low back was feeling better.    Personal Factors and Comorbidities Age;Past/Current Experience;Time since onset of injury/illness/exacerbation;Comorbidity 2;Comorbidity 3+    Comorbidities anxiety, HTN, L4-5 fusion, L hip THA, ACDF C3-4, C4-5, high BMI    Examination-Activity Limitations Locomotion Level;Stairs;Stand;Sleep    Stability/Clinical Decision Making Stable/Uncomplicated    Clinical Decision  Making Low    Rehab Potential Good    PT Frequency 2x / week    PT Duration 8 weeks    PT  Treatment/Interventions ADLs/Self Care Home Management;Aquatic Therapy;Cryotherapy;Electrical Stimulation;Iontophoresis 4mg /ml Dexamethasone;Moist Heat;Traction;Balance training;Therapeutic exercise;Therapeutic activities;Stair training;Gait training;Functional mobility training;Manual techniques;Passive range of motion;Taping;Spinal Manipulations    PT Next Visit Plan Review and assess HEP. Assess 2 min walking test (359 ft)- set goal; asses cryotherapy response; continue IFC prn    PT Home Exercise Plan [id number]    Consulted and Agree with Plan of Care Patient             Patient will benefit from skilled therapeutic intervention in order to improve the following deficits and impairments:  Difficulty walking, Decreased activity tolerance, Postural dysfunction, Pain, Obesity  Visit Diagnosis: Greater trochanteric bursitis of both hips  Muscle weakness (generalized)  Stiffness of left hip, not elsewhere classified  Pain in left hip  Difficulty in walking, not elsewhere classified     Problem List Patient Active Problem List   Diagnosis Date Noted   Hyperlipidemia 12/30/2020   Alpha thalassaemia minor 05/25/2020   Healthcare maintenance 05/25/2020   Acute gout due to renal impairment involving toe 05/20/2019   Neuropathy 05/18/2019   Primary localized osteoarthritis of left hip 11/18/2018   Primary osteoarthritis of left hip 11/18/2018   Microcytic anemia 08/13/2018   Preop examination 11/01/2017   Disc disease, degenerative, cervical 11/01/2017   Liver fibrosis 04/30/2017   NSAID long-term use 01/09/2016   Chronic back pain secondary to DJD of cervical, thoracic and lumbar spine and disc herniation 01/09/2016   Osteoarthritis of both knees 11/02/2011   DISORDER, DEPRESSIVE NEC 09/25/2006   HERPES LABIALIS 04/04/2006   Chronic hepatitis C without hepatic coma ([place]) 02/22/2006   Essential hypertension 02/22/2006     Gar Ponto MS, PT 02/23/21 10:40 PM   [facility] [address] [place], [place], [address] Phone: [phone]   Fax:  [phone]  Name: Greg Flowers MRN: [id number] Date of Birth: 1956/01/27

## 2021-02-23 NOTE — Progress Notes (Signed)
Internal Medicine Clinic Attending ? ?I saw and evaluated the patient.  I personally confirmed the key portions of the history and exam documented by Dr. Bonanno and I reviewed pertinent patient test results.  The assessment, diagnosis, and plan were formulated together and I agree with the documentation in the resident?s note. ? ?

## 2021-02-24 ENCOUNTER — Telehealth: Payer: Self-pay

## 2021-02-24 NOTE — Telephone Encounter (Signed)
Requesting to speak with a nurse about Celebrex, states it cost $144.00. Please call pt back.

## 2021-02-24 NOTE — Telephone Encounter (Signed)
RTC, patient states at his office visit on 11/9, he was prescribed Cymbalta 30 mg. He went to pick up his RX and it was $144 and he cannot afford this.  Will forward to pharm team to see if they can assist (copay cards, etc).  Will also forward to dr. Alroy Bailiff to inform her of above Thank you, SChaplin, RN,BSN

## 2021-02-28 ENCOUNTER — Ambulatory Visit: Payer: Federal, State, Local not specified - PPO

## 2021-02-28 ENCOUNTER — Other Ambulatory Visit: Payer: Self-pay

## 2021-02-28 DIAGNOSIS — R262 Difficulty in walking, not elsewhere classified: Secondary | ICD-10-CM

## 2021-02-28 DIAGNOSIS — M7061 Trochanteric bursitis, right hip: Secondary | ICD-10-CM

## 2021-02-28 DIAGNOSIS — M6281 Muscle weakness (generalized): Secondary | ICD-10-CM

## 2021-02-28 DIAGNOSIS — M25552 Pain in left hip: Secondary | ICD-10-CM

## 2021-02-28 DIAGNOSIS — M7062 Trochanteric bursitis, left hip: Secondary | ICD-10-CM

## 2021-02-28 DIAGNOSIS — M25652 Stiffness of left hip, not elsewhere classified: Secondary | ICD-10-CM

## 2021-03-01 NOTE — Therapy (Signed)
[place], [place], [address] Phone: [phone]   Fax:  [phone]  Physical Therapy Treatment  Patient Details  Name: Greg Flowers MRN: [id number] Date of Birth: [birth year] Referring Provider (PT): Phylliss Bob, MD   Encounter Date: 02/28/2021   PT End of Session - 02/28/21 1347     Visit Number 6    Number of Visits 17    Date for PT Re-Evaluation 04/01/21    Authorization Type BCBS/FEDERAL E[employer] PPO    PT Start Time 1342    PT Stop Time 1425    PT Time Calculation (min) 43 min    Activity Tolerance Patient tolerated treatment well    Behavior During Therapy [facility] for tasks assessed/performed             Past Medical History:  Diagnosis Date   Anxiety    Calculus, kidney 2000   Sees Dr. Risa Grill, urology.    Cervical spondylosis 2004   sp surgery by Dr. Ellene Route   Depression    GERD (gastroesophageal reflux disease)    Headache(784.0)    Chronic, on vicodin 5 TID for this.    Hepatitis C    Has occasional visits with Dr. Oletta [facility], failed RX in 2000.  Liver Biopsy 9/06: Minimally active hepatitis consistent with hepatitis C, minimal necroinflamtory activity grade 1, no incrased fibrosis stage 0.    Herpes labialis    History of kidney stones    Hypertension    Pneumonia    Renal stone    Spondylolisthesis of lumbar region    Thalassemia    HGB 9/09 14.4 wtih MCV 72.6   Wears glasses     Past Surgical History:  Procedure Laterality Date   ANTERIOR CERVICAL DECOMP/DISCECTOMY FUSION N/A 11/21/2017   Procedure: ANTERIOR CERVICAL DECOMPRESSION FUSION, CERVICAL THREE-FOUR, CERVICAL FOUR-FIVE WITH INSTRUMENTATION AND ALLOGRAFT;  Surgeon: Phylliss Bob, MD;  Location: [facility];  Service: Orthopedics;  Laterality: N/A;  ANTERIOR CERVICAL DECOMPRESSION FUSION, CERVICAL THREE-FOUR, CERVICAL FOUR-FIVE WITH INSTRUMENTATION AND ALLOGRAFT   BACK SURGERY  2017   L4-L5 PLATE/SCREWS   CERVICAL  DISCECTOMY  2004   Dr Ellene Route   COLONOSCOPY     LITHOTRIPSY  2004   Dr Amalia Hailey   NASAL SEPTUM SURGERY  2007   TONSILLECTOMY     TOTAL HIP ARTHROPLASTY Left 11/18/2018   Procedure: Left Anterior Hip Arthroplasty;  Surgeon: Melrose Nakayama, MD;  Location: [facility];  Service: Orthopedics;  Laterality: Left;    There were no vitals filed for this visit.   Subjective Assessment - 02/28/21 1352     Subjective Pt reports he found out yesterday is going to have an injection in his low back on 12/3 and received a prescription for ibuprofen for 800mg  which has been helpful.    Patient Stated Goals Improve flexibility and decreaase the pain for his hips and low back    Currently in Pain? Yes    Pain Score 3     Pain Location Back    Pain Orientation Posterior;Lower    Pain Descriptors / Indicators Aching    Pain Type Chronic pain    Pain Onset More than a month ago    Pain Frequency Intermittent    Pain Score 3    Pain Location Hip    Pain Orientation Right;Left    Pain Descriptors / Indicators Aching    Pain Type Chronic pain    Pain Onset More than a month ago  Pain Frequency Intermittent                               [facility] Adult PT Treatment/Exercise - 03/01/21 0001       Exercises   Exercises Other Exercises    Other Exercises  hooklying abdominal isometric into physioball; also obliques x 10 each. bridging x10, 5" hold; HL clams isometrics 10x5"      Lumbar Exercises: Aerobic   Nustep 5', L5, UE/LE      Modalities   Modalities Cryotherapy      Cryotherapy   Number Minutes Cryotherapy 15 Minutes    Cryotherapy Location Lumbar Spine    Type of Cryotherapy Ice pack      Electrical Stimulation   Electrical Stimulation Location lower back    Electrical Stimulation Action TENS 15 mins    Electrical Stimulation Parameters conventional    Electrical Stimulation Goals Pain                     PT Education - 03/01/21 0456     Education Details  Pt brought in the TENS unit he purchased pt was educated in the operation of the unit and electodes, electrode place options, the various modes of TENS, precautions, and contraindications.    Person(s) Educated Patient    Methods Explanation;Demonstration;Tactile cues;Verbal cues    Comprehension Verbalized understanding;Returned demonstration;Verbal cues required;Tactile cues required              PT Short Term Goals - 01/26/21 2150       PT SHORT TERM GOAL #1   Title He will be independent with initial HEP    Baseline started on eval    Status New    Target Date 02/16/21      PT SHORT TERM GOAL #2   Title Pt will voice understanding of measures to assist in decreasing bilat hip and low back pain.    Status New    Target Date 02/16/21               PT Long Term Goals - 01/26/21 2153       PT LONG TERM GOAL #1   Title Pt will demonstrate 4/5 bilat hip strength to decrease pain and improve functional mobility    Status New    Target Date 04/01/21      PT LONG TERM GOAL #2   Title Pt will report a decrease in bilat hip and low back pain to 2/10 or less with daily activiites    Baseline 5/10    Period Weeks    Status New    Target Date 04/01/21      PT LONG TERM GOAL #3   Title Pt's FOTO score for function will increase tothe predicated value of 49%    Baseline 29%    Status New    Target Date 04/01/21      PT LONG TERM GOAL #4   Title Pt will be Ind in a final HEP to maintain pt's achieved LOF    Status New    Target Date 04/01/21                   Plan - 02/28/21 1348     Clinical Impression Statement PT was completed for lumbopelvic/LE mobility and strengthening ther ex. Pt then was educated re: the use of the his new TENS unit he brought in today. TENS was provided  for 15 mins c a cold pack. Pt tolerated the session without adverse effects and voiced understanding re: the use of the TENS unit, see Education. Pt is to continue the use TENS and  cold packs at home for management of his low back pain.    Personal Factors and Comorbidities Age;Past/Current Experience;Time since onset of injury/illness/exacerbation;Comorbidity 2;Comorbidity 3+    Comorbidities anxiety, HTN, L4-5 fusion, L hip THA, ACDF C3-4, C4-5, high BMI    Examination-Activity Limitations Locomotion Level;Stairs;Stand;Sleep    Stability/Clinical Decision Making Stable/Uncomplicated    Clinical Decision Making Low    Rehab Potential Good    PT Frequency 2x / week    PT Duration 8 weeks    PT Treatment/Interventions ADLs/Self Care Home Management;Aquatic Therapy;Cryotherapy;Electrical Stimulation;Iontophoresis 4mg /ml Dexamethasone;Moist Heat;Traction;Balance training;Therapeutic exercise;Therapeutic activities;Stair training;Gait training;Functional mobility training;Manual techniques;Passive range of motion;Taping;Spinal Manipulations    PT Next Visit Plan Review and assess HEP. Assess 2 min walking test (359 ft)- set goal; asses cryotherapy response; continue IFC prn    PT Home Exercise Plan [id number]    Consulted and Agree with Plan of Care Patient             Patient will benefit from skilled therapeutic intervention in order to improve the following deficits and impairments:  Difficulty walking, Decreased activity tolerance, Postural dysfunction, Pain, Obesity  Visit Diagnosis: Greater trochanteric bursitis of both hips  Muscle weakness (generalized)  Stiffness of left hip, not elsewhere classified  Pain in left hip  Difficulty in walking, not elsewhere classified     Problem List Patient Active Problem List   Diagnosis Date Noted   Hyperlipidemia 12/30/2020   Alpha thalassaemia minor 05/25/2020   Healthcare maintenance 05/25/2020   Acute gout due to renal impairment involving toe 05/20/2019   Neuropathy 05/18/2019   Primary localized osteoarthritis of left hip 11/18/2018   Primary osteoarthritis of left hip 11/18/2018   Microcytic anemia  08/13/2018   Preop examination 11/01/2017   Disc disease, degenerative, cervical 11/01/2017   Liver fibrosis 04/30/2017   NSAID long-term use 01/09/2016   Chronic back pain secondary to DJD of cervical, thoracic and lumbar spine and disc herniation 01/09/2016   Osteoarthritis of both knees 11/02/2011   DISORDER, DEPRESSIVE NEC 09/25/2006   HERPES LABIALIS 04/04/2006   Chronic hepatitis C without hepatic coma ([place]) 02/22/2006   Essential hypertension 02/22/2006    Gar Ponto MS, PT 03/01/21 5:17 AM   [place] [address] [place], [place], [address] Phone: [phone]   Fax:  [phone]  Name: Greg Flowers MRN: [id number] Date of Birth: [birth year]

## 2021-03-02 ENCOUNTER — Encounter: Payer: Self-pay | Admitting: Physical Therapy

## 2021-03-02 ENCOUNTER — Other Ambulatory Visit: Payer: Self-pay

## 2021-03-02 ENCOUNTER — Ambulatory Visit: Payer: Federal, State, Local not specified - PPO | Admitting: Physical Therapy

## 2021-03-02 DIAGNOSIS — M7061 Trochanteric bursitis, right hip: Secondary | ICD-10-CM | POA: Diagnosis not present

## 2021-03-02 DIAGNOSIS — M6281 Muscle weakness (generalized): Secondary | ICD-10-CM

## 2021-03-02 DIAGNOSIS — M7062 Trochanteric bursitis, left hip: Secondary | ICD-10-CM

## 2021-03-02 DIAGNOSIS — M25652 Stiffness of left hip, not elsewhere classified: Secondary | ICD-10-CM

## 2021-03-02 DIAGNOSIS — M25552 Pain in left hip: Secondary | ICD-10-CM

## 2021-03-02 DIAGNOSIS — R262 Difficulty in walking, not elsewhere classified: Secondary | ICD-10-CM

## 2021-03-02 NOTE — Therapy (Signed)
[place], [place], [address] Phone: [phone]   Fax:  [phone]  Physical Therapy Treatment  Patient Details  Name: Greg Flowers MRN: [id number] Date of Birth: [birth year] Referring Provider (PT): Phylliss Bob, MD   Encounter Date: 03/02/2021   PT End of Session - 03/02/21 1328     Visit Number 7    Number of Visits 17    Date for PT Re-Evaluation 04/01/21    Authorization Type BCBS/FEDERAL EMP PPO    PT Start Time 1323    PT Stop Time 1412    PT Time Calculation (min) 49 min             Past Medical History:  Diagnosis Date   Anxiety    Calculus, kidney 2000   Sees Dr. Risa Grill, urology.    Cervical spondylosis 2004   sp surgery by Dr. Ellene Route   Depression    GERD (gastroesophageal reflux disease)    Headache(784.0)    Chronic, on vicodin 5 TID for this.    Hepatitis C    Has occasional visits with Dr. Oletta [facility], failed RX in 2000.  Liver Biopsy 9/06: Minimally active hepatitis consistent with hepatitis C, minimal necroinflamtory activity grade 1, no incrased fibrosis stage 0.    Herpes labialis    History of kidney stones    Hypertension    Pneumonia    Renal stone    Spondylolisthesis of lumbar region    Thalassemia    HGB 9/09 14.4 wtih MCV 72.6   Wears glasses     Past Surgical History:  Procedure Laterality Date   ANTERIOR CERVICAL DECOMP/DISCECTOMY FUSION N/A 11/21/2017   Procedure: ANTERIOR CERVICAL DECOMPRESSION FUSION, CERVICAL THREE-FOUR, CERVICAL FOUR-FIVE WITH INSTRUMENTATION AND ALLOGRAFT;  Surgeon: Phylliss Bob, MD;  Location: [facility];  Service: Orthopedics;  Laterality: N/A;  ANTERIOR CERVICAL DECOMPRESSION FUSION, CERVICAL THREE-FOUR, CERVICAL FOUR-FIVE WITH INSTRUMENTATION AND ALLOGRAFT   BACK SURGERY  2017   L4-L5 PLATE/SCREWS   CERVICAL DISCECTOMY  2004   Dr Ellene Route   COLONOSCOPY     LITHOTRIPSY  2004   Dr Amalia Hailey   NASAL SEPTUM SURGERY  2007    TONSILLECTOMY     TOTAL HIP ARTHROPLASTY Left 11/18/2018   Procedure: Left Anterior Hip Arthroplasty;  Surgeon: Melrose Nakayama, MD;  Location: [facility];  Service: Orthopedics;  Laterality: Left;    There were no vitals filed for this visit.   Subjective Assessment - 03/02/21 1327     Subjective Today is the first day that I have gotten up in the morning without pain. I started 800 mg ibuprofen 2 days ago.    Currently in Pain? No/denies                [facility] PT Assessment - 03/02/21 0001       Observation/Other Assessments   Focus on Therapeutic Outcomes (FOTO)  31%      Strength   Right Hip Flexion 4/5    Right Hip ABduction 4-/5    Left Hip Flexion 4/5    Left Hip ABduction 4-/5                           [facility] Adult PT Treatment/Exercise - 03/02/21 0001       Therapeutic Activites    Therapeutic Activities Lifting    Lifting Hip hinge training with wooden dowel along spine for feedback, able to demonstrate Dentist education and  without dowel- 20 reps with 10 as sit-stand hip hinge      Lumbar Exercises: Aerobic   Nustep 5', L6, UE/LE      Lumbar Exercises: Supine   Bridge 20 reps      Lumbar Exercises: Sidelying   Hip Abduction 10 reps    Hip Abduction Weights (lbs) 2 sets      Cryotherapy   Number Minutes Cryotherapy 10 Minutes    Cryotherapy Location Lumbar Spine    Type of Cryotherapy Ice pack      Electrical Stimulation   Electrical Stimulation Location low back    Electrical Stimulation Action TENS 15 min    Electrical Stimulation Parameters default, medium intensity    Electrical Stimulation Goals Pain                       PT Short Term Goals - 03/02/21 1328       PT SHORT TERM GOAL #1   Title He will be independent with initial HEP    Time 2    Period Weeks    Status Achieved    Target Date 02/16/21      PT SHORT TERM GOAL #2   Title Pt will voice understanding of measures to assist in decreasing  bilat hip and low back pain.    Baseline with medication; 03/02/21: using ice, meds, TENS    Time 3    Period Weeks    Status Achieved    Target Date 02/16/21               PT Long Term Goals - 01/26/21 2153       PT LONG TERM GOAL #1   Title Pt will demonstrate 4/5 bilat hip strength to decrease pain and improve functional mobility    Status New    Target Date 04/01/21      PT LONG TERM GOAL #2   Title Pt will report a decrease in bilat hip and low back pain to 2/10 or less with daily activiites    Baseline 5/10    Period Weeks    Status New    Target Date 04/01/21      PT LONG TERM GOAL #3   Title Pt's FOTO score for function will increase tothe predicated value of 49%    Baseline 29%    Status New    Target Date 04/01/21      PT LONG TERM GOAL #4   Title Pt will be Ind in a final HEP to maintain pt's achieved LOF    Status New    Target Date 04/01/21                   Plan - 03/02/21 1335     Clinical Impression Statement Pt reports no pain today in low back which is a significant improvement. He has used his TENS one time since he brought it last session for instruction. He brought it again today for another review of modes. His FOTO score was retaken last visit and indicated no change in household tasks and lifting/carrying. Began hip hinge and light light lifting today to progress toward LTGS. he is independent with his HEP and can verbalize measures to manage pain. STG# 1 & 2 met.    PT Treatment/Interventions ADLs/Self Care Home Management;Aquatic Therapy;Cryotherapy;Electrical Stimulation;Iontophoresis 56m/ml Dexamethasone;Moist Heat;Traction;Balance training;Therapeutic exercise;Therapeutic activities;Stair training;Gait training;Functional mobility training;Manual techniques;Passive range of motion;Taping;Spinal Manipulations    PT Next Visit Plan continue hip hinge  and lifting; Review and assess HEP. Assess 2 min walking test (359 ft)- set goal;  asses cryotherapy response; continue IFC prn    PT Home Exercise Plan [email or web address]             Patient will benefit from skilled therapeutic intervention in order to improve the following deficits and impairments:  Difficulty walking, Decreased activity tolerance, Postural dysfunction, Pain, Obesity  Visit Diagnosis: Greater trochanteric bursitis of both hips  Muscle weakness (generalized)  Stiffness of left hip, not elsewhere classified  Pain in left hip  Difficulty in walking, not elsewhere classified     Problem List Patient Active Problem List   Diagnosis Date Noted   Hyperlipidemia 12/30/2020   Alpha thalassaemia minor 05/25/2020   Healthcare maintenance 05/25/2020   Acute gout due to renal impairment involving toe 05/20/2019   Neuropathy 05/18/2019   Primary localized osteoarthritis of left hip 11/18/2018   Primary osteoarthritis of left hip 11/18/2018   Microcytic anemia 08/13/2018   Preop examination 11/01/2017   Disc disease, degenerative, cervical 11/01/2017   Liver fibrosis 04/30/2017   NSAID long-term use 01/09/2016   Chronic back pain secondary to DJD of cervical, thoracic and lumbar spine and disc herniation 01/09/2016   Osteoarthritis of both knees 11/02/2011   DISORDER, DEPRESSIVE NEC 09/25/2006   HERPES LABIALIS 04/04/2006   Chronic hepatitis C without hepatic coma ([place]) 02/22/2006   Essential hypertension 02/22/2006    Dorene Ar, PTA 03/02/2021, 2:13 PM  [facility] [address] [place], [place], [address] Phone: [phone]   Fax:  [phone]  Name: Greg Flowers MRN: [id number] Date of Birth: [birth year]

## 2021-03-06 ENCOUNTER — Ambulatory Visit: Payer: Federal, State, Local not specified - PPO | Admitting: Physical Therapy

## 2021-03-08 ENCOUNTER — Ambulatory Visit: Payer: Federal, State, Local not specified - PPO | Admitting: Physical Therapy

## 2021-03-15 ENCOUNTER — Other Ambulatory Visit: Payer: Self-pay

## 2021-03-15 ENCOUNTER — Ambulatory Visit: Payer: Federal, State, Local not specified - PPO | Admitting: Physical Therapy

## 2021-03-15 ENCOUNTER — Encounter: Payer: Self-pay | Admitting: Physical Therapy

## 2021-03-15 DIAGNOSIS — M25652 Stiffness of left hip, not elsewhere classified: Secondary | ICD-10-CM

## 2021-03-15 DIAGNOSIS — M6281 Muscle weakness (generalized): Secondary | ICD-10-CM

## 2021-03-15 DIAGNOSIS — M7061 Trochanteric bursitis, right hip: Secondary | ICD-10-CM | POA: Diagnosis not present

## 2021-03-15 DIAGNOSIS — M25552 Pain in left hip: Secondary | ICD-10-CM

## 2021-03-15 DIAGNOSIS — R262 Difficulty in walking, not elsewhere classified: Secondary | ICD-10-CM

## 2021-03-15 NOTE — Therapy (Signed)
[place], [place], [address] Phone: [phone]   Fax:  [phone]  Physical Therapy Treatment  Patient Details  Name: Greg Flowers MRN: [id number] Date of Birth: 11/28/1955 Referring Provider (PT): Phylliss Bob, MD   Encounter Date: 03/15/2021   PT End of Session - 03/15/21 1414     Visit Number 8    Number of Visits 17    Date for PT Re-Evaluation 04/01/21    Authorization Type BCBS/FEDERAL EMP PPO    PT Start Time 1410    PT Stop Time 1459    PT Time Calculation (min) 49 min             Past Medical History:  Diagnosis Date   Anxiety    Calculus, kidney 2000   Sees Dr. Risa Grill, urology.    Cervical spondylosis 2004   sp surgery by Dr. Ellene Route   Depression    GERD (gastroesophageal reflux disease)    Headache(784.0)    Chronic, on vicodin 5 TID for this.    Hepatitis C    Has occasional visits with Dr. Oletta [facility], failed RX in 2000.  Liver Biopsy 9/06: Minimally active hepatitis consistent with hepatitis C, minimal necroinflamtory activity grade 1, no incrased fibrosis stage 0.    Herpes labialis    History of kidney stones    Hypertension    Pneumonia    Renal stone    Spondylolisthesis of lumbar region    Thalassemia    HGB 9/09 14.4 wtih MCV 72.6   Wears glasses     Past Surgical History:  Procedure Laterality Date   ANTERIOR CERVICAL DECOMP/DISCECTOMY FUSION N/A 11/21/2017   Procedure: ANTERIOR CERVICAL DECOMPRESSION FUSION, CERVICAL THREE-FOUR, CERVICAL FOUR-FIVE WITH INSTRUMENTATION AND ALLOGRAFT;  Surgeon: Phylliss Bob, MD;  Location: [facility];  Service: Orthopedics;  Laterality: N/A;  ANTERIOR CERVICAL DECOMPRESSION FUSION, CERVICAL THREE-FOUR, CERVICAL FOUR-FIVE WITH INSTRUMENTATION AND ALLOGRAFT   BACK SURGERY  2017   L4-L5 PLATE/SCREWS   CERVICAL DISCECTOMY  2004   Dr Ellene Route   COLONOSCOPY     LITHOTRIPSY  2004   Dr Amalia Hailey   NASAL SEPTUM SURGERY  2007    TONSILLECTOMY     TOTAL HIP ARTHROPLASTY Left 11/18/2018   Procedure: Left Anterior Hip Arthroplasty;  Surgeon: Melrose Nakayama, MD;  Location: [facility];  Service: Orthopedics;  Laterality: Left;    There were no vitals filed for this visit.   Subjective Assessment - 03/15/21 1413     Subjective I [place] a lot of walking [place] the mall. My back and hips did good. I sat down when I got tired but no pain. 3/10 in low back now. Dr prescribed CYMBALTA for my pain and I jsut started it this week.    Currently in Pain? Yes    Pain Score 3     Pain Location Back    Pain Orientation Lower    Pain Descriptors / Indicators Aching    Pain Type Chronic pain    Aggravating Factors  up and down stairs , sitting, lifting    Pain Relieving Factors meds, TENS, stretches             [facility] Adult PT Treatment/Exercise - 03/02/21 0001       Therapeutic Activites    Therapeutic Activities Lifting    Lifting Hip hinge training with wooden dowel along spine for feedback, able to demonstrate deadlift mechanic education and without dowel-  Progressed to 8# lifting from stool  x 6 , from 6 inch step x 10     Lumbar Exercises: Aerobic   Nustep 5', L6, UE/LE   Supine : piriformis stretches  ; single knee to cest  Standing: Free motions rows 12 x 2 26# 7 # palloff press x 10 each Shoulder extension 20# x 20  Modalities : ice pack to lumbar seated for 10 minutes        PT Short Term Goals - 03/02/21 1328       PT SHORT TERM GOAL #1   Title He will be independent with initial HEP    Time 2    Period Weeks    Status Achieved    Target Date 02/16/21      PT SHORT TERM GOAL #2   Title Pt will voice understanding of measures to assist in decreasing bilat hip and low back pain.    Baseline with medication; 03/02/21: using ice, meds, TENS    Time 3    Period Weeks    Status Achieved    Target Date 02/16/21               PT Long Term Goals - 01/26/21 2153       PT LONG TERM GOAL #1   Title  Pt will demonstrate 4/5 bilat hip strength to decrease pain and improve functional mobility    Status New    Target Date 04/01/21      PT LONG TERM GOAL #2   Title Pt will report a decrease in bilat hip and low back pain to 2/10 or less with daily activiites    Baseline 5/10    Period Weeks    Status New    Target Date 04/01/21      PT LONG TERM GOAL #3   Title Pt's FOTO score for function will increase tothe predicated value of 49%    Baseline 29%    Status New    Target Date 04/01/21      PT LONG TERM GOAL #4   Title Pt will be Ind in a final HEP to maintain pt's achieved LOF    Status New    Target Date 04/01/21                   Plan - 03/15/21 1442     Clinical Impression Statement Pt reports starting new medicine, cymbalta and will check dose. Worked on standing core and lifting mechanics with good tolerance. Progressed to dead lifting with 8# from 6 inch step. Pt reported no increased pain.    PT Treatment/Interventions ADLs/Self Care Home Management;Aquatic Therapy;Cryotherapy;Electrical Stimulation;Iontophoresis 4mg /ml Dexamethasone;Moist Heat;Traction;Balance training;Therapeutic exercise;Therapeutic activities;Stair training;Gait training;Functional mobility training;Manual techniques;Passive range of motion;Taping;Spinal Manipulations    PT Next Visit Plan continue hip hinge and lifting- standing core ; Review and assess HEP. Assess 2 min walking test (359 ft)- set goal; asses cryotherapy response;    PT Home Exercise Plan [id number]             Patient will benefit from skilled therapeutic intervention in order to improve the following deficits and impairments:  Difficulty walking, Decreased activity tolerance, Postural dysfunction, Pain, Obesity  Visit Diagnosis: Greater trochanteric bursitis of both hips  Muscle weakness (generalized)  Stiffness of left hip, not elsewhere classified  Pain in left hip  Difficulty in walking, not elsewhere  classified     Problem List Patient Active Problem List   Diagnosis Date Noted   Hyperlipidemia 12/30/2020   Alpha thalassaemia minor  05/25/2020   Healthcare maintenance 05/25/2020   Acute gout due to renal impairment involving toe 05/20/2019   Neuropathy 05/18/2019   Primary localized osteoarthritis of left hip 11/18/2018   Primary osteoarthritis of left hip 11/18/2018   Microcytic anemia 08/13/2018   Preop examination 11/01/2017   Disc disease, degenerative, cervical 11/01/2017   Liver fibrosis 04/30/2017   NSAID long-term use 01/09/2016   Chronic back pain secondary to DJD of cervical, thoracic and lumbar spine and disc herniation 01/09/2016   Osteoarthritis of both knees 11/02/2011   DISORDER, DEPRESSIVE NEC 09/25/2006   HERPES LABIALIS 04/04/2006   Chronic hepatitis C without hepatic coma ([facility]) 02/22/2006   Essential hypertension 02/22/2006    Sherrie Mustache, PTA 03/15/2021, 3:02 PM  [facility] [address] [place], [place], [address] Phone: [phone]   Fax:  [phone]  Name: Greg Flowers MRN: [id number] Date of Birth: 12-02-1955

## 2021-03-16 ENCOUNTER — Ambulatory Visit: Payer: Federal, State, Local not specified - PPO | Attending: Orthopedic Surgery

## 2021-03-16 DIAGNOSIS — M7061 Trochanteric bursitis, right hip: Secondary | ICD-10-CM | POA: Insufficient documentation

## 2021-03-16 DIAGNOSIS — M6281 Muscle weakness (generalized): Secondary | ICD-10-CM | POA: Insufficient documentation

## 2021-03-16 DIAGNOSIS — R262 Difficulty in walking, not elsewhere classified: Secondary | ICD-10-CM | POA: Insufficient documentation

## 2021-03-16 DIAGNOSIS — M7062 Trochanteric bursitis, left hip: Secondary | ICD-10-CM | POA: Diagnosis present

## 2021-03-16 DIAGNOSIS — M25552 Pain in left hip: Secondary | ICD-10-CM | POA: Insufficient documentation

## 2021-03-16 DIAGNOSIS — M25652 Stiffness of left hip, not elsewhere classified: Secondary | ICD-10-CM | POA: Insufficient documentation

## 2021-03-16 NOTE — Therapy (Signed)
[place], [place], [address] Phone: [phone]   Fax:  [phone]  Physical Therapy Treatment  Patient Details  Name: LABON FONT MRN: [id number] Date of Birth: 1955-11-14 Referring Provider (PT): Phylliss Bob, MD   Encounter Date: 03/16/2021   PT End of Session - 03/16/21 1429     Visit Number 9    Number of Visits 17    Date for PT Re-Evaluation 04/01/21    Authorization Type BCBS/FEDERAL E[employer] PPO    PT Start Time 1420    PT Stop Time 1510    PT Time Calculation (min) 50 min    Activity Tolerance Patient tolerated treatment well    Behavior During Therapy [facility] for tasks assessed/performed             Past Medical History:  Diagnosis Date   Anxiety    Calculus, kidney 2000   Sees Dr. Risa Grill, urology.    Cervical spondylosis 2004   sp surgery by Dr. Ellene Route   Depression    GERD (gastroesophageal reflux disease)    Headache(784.0)    Chronic, on vicodin 5 TID for this.    Hepatitis C    Has occasional visits with Dr. Oletta Lamas GI, failed RX in 2000.  Liver Biopsy 9/06: Minimally active hepatitis consistent with hepatitis C, minimal necroinflamtory activity grade 1, no incrased fibrosis stage 0.    Herpes labialis    History of kidney stones    Hypertension    Pneumonia    Renal stone    Spondylolisthesis of lumbar region    Thalassemia    HGB 9/09 14.4 wtih MCV 72.6   Wears glasses     Past Surgical History:  Procedure Laterality Date   ANTERIOR CERVICAL DECOMP/DISCECTOMY FUSION N/A 11/21/2017   Procedure: ANTERIOR CERVICAL DECOMPRESSION FUSION, CERVICAL THREE-FOUR, CERVICAL FOUR-FIVE WITH INSTRUMENTATION AND ALLOGRAFT;  Surgeon: Phylliss Bob, MD;  Location: [place];  Service: Orthopedics;  Laterality: N/A;  ANTERIOR CERVICAL DECOMPRESSION FUSION, CERVICAL THREE-FOUR, CERVICAL FOUR-FIVE WITH INSTRUMENTATION AND ALLOGRAFT   BACK SURGERY  2017   L4-L5 PLATE/SCREWS   CERVICAL  DISCECTOMY  2004   Dr Ellene Route   COLONOSCOPY     LITHOTRIPSY  2004   Dr Amalia Hailey   NASAL SEPTUM SURGERY  2007   TONSILLECTOMY     TOTAL HIP ARTHROPLASTY Left 11/18/2018   Procedure: Left Anterior Hip Arthroplasty;  Surgeon: Melrose Nakayama, MD;  Location: [facility];  Service: Orthopedics;  Laterality: Left;    There were no vitals filed for this visit.   Subjective Assessment - 03/16/21 1424     Subjective Pt reports his low back pain has been improved since he started taking cymbalta. He has been more active planting lettuce in planters.    Patient Stated Goals Improve flexibility and decreaase the pain for his hips and low back    Currently in Pain? Yes    Pain Score 3     Pain Location Back    Pain Orientation Lower    Pain Descriptors / Indicators Aching    Pain Type Chronic pain    Pain Onset More than a month ago    Aggravating Factors  Up steps    Pain Relieving Factors Following DN, the pt reported his L low back ROM was improved.    Pain Score 0    Pain Location Hip    Pain Orientation Left;Right  [facility] Adult PT Treatment/Exercise - 03/17/21 0001       Therapeutic Activites    Therapeutic Activities Lifting    Lifting Hip hinge training with wooden dowel along spine for feedback, Pt completed STS, hinged hip, 10x. Pt then completed 20 reps with 10# c hinged hip lifts to 12" stool. Pt completed lifting activities with proper technique      Lumbar Exercises: Aerobic   Nustep 5', L6, UE/LE              [facility] Adult PT Treatment/Exercise:  Therapeutic Exercise:  - Supine : piriformis stretches  ; single knee to chest 2, 20 sec each - PPT, hands up thighs, x15, 3" - Abdominal bracing, x5, 10"  - Standing: Freemotion side steps at 3#, 3x61ft, B side   Modalities : Ice pack to lumbar seated for 10 minutes         PT Short Term Goals - 03/02/21 1328       PT SHORT TERM GOAL #1   Title He will be independent  with initial HEP    Time 2    Period Weeks    Status Achieved    Target Date 02/16/21      PT SHORT TERM GOAL #2   Title Pt will voice understanding of measures to assist in decreasing bilat hip and low back pain.    Baseline with medication; 03/02/21: using ice, meds, TENS    Time 3    Period Weeks    Status Achieved    Target Date 02/16/21               PT Long Term Goals - 01/26/21 2153       PT LONG TERM GOAL #1   Title Pt will demonstrate 4/5 bilat hip strength to decrease pain and improve functional mobility    Status New    Target Date 04/01/21      PT LONG TERM GOAL #2   Title Pt will report a decrease in bilat hip and low back pain to 2/10 or less with daily activiites    Baseline 5/10    Period Weeks    Status New    Target Date 04/01/21      PT LONG TERM GOAL #3   Title Pt's FOTO score for function will increase tothe predicated value of 49%    Baseline 29%    Status New    Target Date 04/01/21      PT LONG TERM GOAL #4   Title Pt will be Ind in a final HEP to maintain pt's achieved LOF    Status New    Target Date 04/01/21                   Plan - 03/17/21 0919     Clinical Impression Statement Pt continues to report decreased low back and hip pain levels with new medication, cymbalta. PT continued to address lumbopelvic flexibility and stability. Abdominal and core/hip strengthening/stability were completed today with mat ther ex and resisted side steps with the freemotion. Hinged hip lifting was also completed with pt demonstating proper technique. Pt is making proper progress re: pain and functional mobility. A cold pack was provided after ther ex for symptom management.    Personal Factors and Comorbidities Age;Past/Current Experience;Time since onset of injury/illness/exacerbation;Comorbidity 2;Comorbidity 3+    Comorbidities anxiety, HTN, L4-5 fusion, L hip THA, ACDF C3-4, C4-5, high BMI    Examination-Activity Limitations Locomotion  Level;Stairs;Stand;Sleep  Stability/Clinical Decision Making Stable/Uncomplicated    Clinical Decision Making Low    Rehab Potential Good    PT Frequency 2x / week    PT Duration 8 weeks    PT Treatment/Interventions ADLs/Self Care Home Management;Aquatic Therapy;Cryotherapy;Electrical Stimulation;Iontophoresis 4mg /ml Dexamethasone;Moist Heat;Traction;Balance training;Therapeutic exercise;Therapeutic activities;Stair training;Gait training;Functional mobility training;Manual techniques;Passive range of motion;Taping;Spinal Manipulations    PT Next Visit Plan Progress ther ex and functional activity as tolerated. Assess progress with LTGs    PT Home Exercise Plan    Consulted and Agree with Plan of Care Patient             Patient will benefit from skilled therapeutic intervention in order to improve the following deficits and impairments:  Difficulty walking, Decreased activity tolerance, Postural dysfunction, Pain, Obesity  Visit Diagnosis: Greater trochanteric bursitis of both hips  Muscle weakness (generalized)  Stiffness of left hip, not elsewhere classified  Pain in left hip  Difficulty in walking, not elsewhere classified     Problem List Patient Active Problem List   Diagnosis Date Noted   Hyperlipidemia 12/30/2020   Alpha thalassaemia minor 05/25/2020   Healthcare maintenance 05/25/2020   Acute gout due to renal impairment involving toe 05/20/2019   Neuropathy 05/18/2019   Primary localized osteoarthritis of left hip 11/18/2018   Primary osteoarthritis of left hip 11/18/2018   Microcytic anemia 08/13/2018   Preop examination 11/01/2017   Disc disease, degenerative, cervical 11/01/2017   Liver fibrosis 04/30/2017   NSAID long-term use 01/09/2016   Chronic back pain secondary to DJD of cervical, thoracic and lumbar spine and disc herniation 01/09/2016   Osteoarthritis of both knees 11/02/2011   DISORDER, DEPRESSIVE NEC 09/25/2006   HERPES LABIALIS  04/04/2006   Chronic hepatitis C without hepatic coma (HCC) 02/22/2006   Essential hypertension 02/22/2006    13/12/2005 MS, PT 03/17/21 9:35 AM   [facility] [address] [place], [place], [place] Phone: [phone]   Fax:  [phone]  Name: DEMETRIOS BYRON MRN: Lorriane Shire Date of Birth: 04/24/1955

## 2021-03-28 ENCOUNTER — Encounter: Payer: Federal, State, Local not specified - PPO | Admitting: Physical Therapy

## 2021-03-29 ENCOUNTER — Other Ambulatory Visit: Payer: Self-pay

## 2021-03-29 ENCOUNTER — Ambulatory Visit: Payer: Federal, State, Local not specified - PPO | Admitting: Physical Therapy

## 2021-03-29 ENCOUNTER — Encounter: Payer: Self-pay | Admitting: Physical Therapy

## 2021-03-29 DIAGNOSIS — M25652 Stiffness of left hip, not elsewhere classified: Secondary | ICD-10-CM

## 2021-03-29 DIAGNOSIS — M7061 Trochanteric bursitis, right hip: Secondary | ICD-10-CM

## 2021-03-29 DIAGNOSIS — M25552 Pain in left hip: Secondary | ICD-10-CM

## 2021-03-29 DIAGNOSIS — M6281 Muscle weakness (generalized): Secondary | ICD-10-CM

## 2021-03-29 NOTE — Therapy (Addendum)
[place], [place], [address] Phone: [phone]   Fax:  [phone]  Physical Therapy Treatment/Discharge  Patient Details  Name: Greg Flowers MRN: [id number] Date of Birth: June 19, 1955 Referring Provider (PT): Phylliss Bob, MD   Encounter Date: 03/29/2021   PT End of Session - 03/29/21 1411     Visit Number 10    Number of Visits 17    Date for PT Re-Evaluation 04/01/21    Authorization Type BCBS/FEDERAL EMP PPO    PT Start Time 1402    PT Stop Time 1440    PT Time Calculation (min) 38 min             Past Medical History:  Diagnosis Date   Anxiety    Calculus, kidney 2000   Sees Dr. Risa Grill, urology.    Cervical spondylosis 2004   sp surgery by Dr. Ellene Route   Depression    GERD (gastroesophageal reflux disease)    Headache(784.0)    Chronic, on vicodin 5 TID for this.    Hepatitis C    Has occasional visits with Dr. Oletta Lamas GI, failed RX in 2000.  Liver Biopsy 9/06: Minimally active hepatitis consistent with hepatitis C, minimal necroinflamtory activity grade 1, no incrased fibrosis stage 0.    Herpes labialis    History of kidney stones    Hypertension    Pneumonia    Renal stone    Spondylolisthesis of lumbar region    Thalassemia    HGB 9/09 14.4 wtih MCV 72.6   Wears glasses     Past Surgical History:  Procedure Laterality Date   ANTERIOR CERVICAL DECOMP/DISCECTOMY FUSION N/A 11/21/2017   Procedure: ANTERIOR CERVICAL DECOMPRESSION FUSION, CERVICAL THREE-FOUR, CERVICAL FOUR-FIVE WITH INSTRUMENTATION AND ALLOGRAFT;  Surgeon: Phylliss Bob, MD;  Location: [place];  Service: Orthopedics;  Laterality: N/A;  ANTERIOR CERVICAL DECOMPRESSION FUSION, CERVICAL THREE-FOUR, CERVICAL FOUR-FIVE WITH INSTRUMENTATION AND ALLOGRAFT   BACK SURGERY  2017   L4-L5 PLATE/SCREWS   CERVICAL DISCECTOMY  2004   Dr Ellene Route   COLONOSCOPY     LITHOTRIPSY  2004   Dr Amalia Hailey   NASAL SEPTUM SURGERY  2007    TONSILLECTOMY     TOTAL HIP ARTHROPLASTY Left 11/18/2018   Procedure: Left Anterior Hip Arthroplasty;  Surgeon: Melrose Nakayama, MD;  Location: [facility];  Service: Orthopedics;  Laterality: Left;    There were no vitals filed for this visit.   Subjective Assessment - 03/29/21 1409     Subjective I was in [place] for 1 week taking care of grandchild and lifting a 67 year old and a baby. I had increased pain in the mornings 4-5/10. I received a back injection yesterday and I have no pain now or this morning.    Currently in Pain? No/denies    Pain Score 0-No pain    Pain Location Back    Pain Orientation Lower                [facility] PT Assessment - 03/29/21 0001       Observation/Other Assessments   Focus on Therapeutic Outcomes (FOTO)  59%      Strength   Right Hip Flexion 4+/5    Right Hip ABduction 4/5    Left Hip Flexion 4+/5    Left Hip ABduction 4/5            [facility] Adult PT Treatment/Exercise:  Therapeutic Exercise: -wooden dowel hip hinging x 15 - hip hinge with 10#  squat to mat x 15 - hip hinge with 16# squat to mat x 20 - Supine : piriformis stretches  ; single knee to chest 2, 20 sec each - PPT x 20 -PPT with bridge x 20  -side hip abduction x 20 each  - Table top up and downs x 10  - Standing: Freemotion palloff press 7# x 20 each  -shoulder row at freemotion 263 x 20                             PT Short Term Goals - 03/02/21 1328       PT SHORT TERM GOAL #1   Title He will be independent with initial HEP    Time 2    Period Weeks    Status Achieved    Target Date 02/16/21      PT SHORT TERM GOAL #2   Title Pt will voice understanding of measures to assist in decreasing bilat hip and low back pain.    Baseline with medication; 03/02/21: using ice, meds, TENS    Time 3    Period Weeks    Status Achieved    Target Date 02/16/21               PT Long Term Goals - 03/29/21 1433       PT LONG TERM GOAL #1   Title  Pt will demonstrate 4/5 bilat hip strength to decrease pain and improve functional mobility    Baseline see objective measures    Time 4    Period Weeks    Status Achieved      PT LONG TERM GOAL #2   Title Pt will report a decrease in bilat hip and low back pain to 2/10 or less with daily activiites    Baseline 0/10 hip pain, back pain 0-5/10 at most    Time 6    Period Weeks    Status Partially Met    Target Date 04/01/21      PT LONG TERM GOAL #3   Title Pt's FOTO score for function will increase tothe predicated value of 49%    Baseline 29% improved to 59%    Time 6    Period Weeks    Status Achieved      PT LONG TERM GOAL #4   Title Pt will be Ind in a final HEP to maintain pt's achieved LOF    Time 6    Period Weeks    Status On-going    Target Date 04/01/21                   Plan - 03/29/21 1417     Clinical Impression Statement Pt arrives to PT with 0/10 back pain. He received an injection yesterday and reports no pain upon waking this morning. He also continues wtihout bilateral hip pain. He is at the end of his PT POC this week and therefore goals checked today. FOTO score improved to 59 which is 10 points beyond prediction. His Hip strength has improved to 4/5 to 4+/5. Continued with lifting focus this week and closed chain core strength. We will continue one more visit to review HEP and discahrge.    PT Treatment/Interventions ADLs/Self Care Home Management;Aquatic Therapy;Cryotherapy;Electrical Stimulation;Iontophoresis 62m/ml Dexamethasone;Moist Heat;Traction;Balance training;Therapeutic exercise;Therapeutic activities;Stair training;Gait training;Functional mobility training;Manual techniques;Passive range of motion;Taping;Spinal Manipulations    PT Next Visit Plan Progress ther ex and functional activity as tolerated.  Assess progress with LTGs   ° PT Home Exercise Plan [id number]   ° °  °  ° °  ° ° °Patient will benefit from skilled therapeutic intervention in  order to improve the following deficits and impairments:  Difficulty walking, Decreased activity tolerance, Postural dysfunction, Pain, Obesity ° °Visit Diagnosis: °Greater trochanteric bursitis of both hips ° °Muscle weakness (generalized) ° °Stiffness of left hip, not elsewhere classified ° °Pain in left hip ° ° ° ° °Problem List °Patient Active Problem List  ° Diagnosis Date Noted  ° Hyperlipidemia 12/30/2020  ° Alpha thalassaemia minor 05/25/2020  ° Healthcare maintenance 05/25/2020  ° Acute gout due to renal impairment involving toe 05/20/2019  ° Neuropathy 05/18/2019  ° Primary localized osteoarthritis of left hip 11/18/2018  ° Primary osteoarthritis of left hip 11/18/2018  ° Microcytic anemia 08/13/2018  ° Preop examination 11/01/2017  ° Disc disease, degenerative, cervical 11/01/2017  ° Liver fibrosis 04/30/2017  ° NSAID long-term use 01/09/2016  ° Chronic back pain secondary to DJD of cervical, thoracic and lumbar spine and disc herniation 01/09/2016  ° Osteoarthritis of both knees 11/02/2011  ° DISORDER, DEPRESSIVE NEC 09/25/2006  ° HERPES LABIALIS 04/04/2006  ° Chronic hepatitis C without hepatic coma (HCC) 02/22/2006  ° Essential hypertension 02/22/2006  ° ° °,  [place], PTA °03/29/2021, 2:57 PM ° °PHYSICAL THERAPY DISCHARGE SUMMARY ° °Visits from Start of Care: 10 ° °Current functional level related to goals / functional outcomes: °See above. Pt was ill and cancelled his last visit °  °Remaining deficits: °See above °  °Education / Equipment: °HEP  ° °Patient agrees to discharge. Patient goals were partially met. Patient is being discharged due to being pleased with the current functional level. ° ° ° °Allen Ralls MS, PT °04/16/21 12:02 AM ° ° °[facility] °[address] °[place], [place], [address] °Phone: [phone]   Fax:  [phone] ° °Name: Eithen L Verdun °MRN: [id number] °Date of Birth: 07/03/1955 ° ° ° °

## 2021-03-30 ENCOUNTER — Ambulatory Visit: Payer: Federal, State, Local not specified - PPO | Admitting: Physical Therapy

## 2021-04-03 ENCOUNTER — Encounter: Payer: Self-pay | Admitting: *Deleted

## 2021-04-06 ENCOUNTER — Ambulatory Visit: Payer: Federal, State, Local not specified - PPO

## 2021-04-11 ENCOUNTER — Telehealth: Payer: Self-pay

## 2021-04-11 NOTE — Telephone Encounter (Signed)
Pt states he is sick and feeling weak. Requesting to speak with a nurse. Please call pt back. Pt is sch for tomorrow @ 1:45 for Telehealth.

## 2021-04-11 NOTE — Telephone Encounter (Signed)
Return call to pt who stated started having flu-like symptoms x 2 weeks. He has been taking OTC meds. No fever nor pain in his chest. Continues to have a productive cough of mucous.He has a telehealth appt tomorrow @ 1345 PM w/Dr Huel Cote. Pt informed to go to UC or ED if his symptoms worsen and/or have trouble breathing- stated he understands.

## 2021-04-12 ENCOUNTER — Ambulatory Visit (INDEPENDENT_AMBULATORY_CARE_PROVIDER_SITE_OTHER): Payer: Federal, State, Local not specified - PPO | Admitting: Internal Medicine

## 2021-04-12 DIAGNOSIS — J069 Acute upper respiratory infection, unspecified: Secondary | ICD-10-CM

## 2021-04-12 HISTORY — DX: Acute upper respiratory infection, unspecified: J06.9

## 2021-04-12 MED ORDER — BENZONATATE 200 MG PO CAPS
200.0000 mg | ORAL_CAPSULE | Freq: Three times a day (TID) | ORAL | 0 refills | Status: DC | PRN
Start: 2021-04-12 — End: 2022-03-02

## 2021-04-12 MED ORDER — GUAIFENESIN ER 600 MG PO TB12: 600.0000 mg | ORAL_TABLET | Freq: Two times a day (BID) | ORAL | 0 refills | Status: DC

## 2021-04-12 NOTE — Assessment & Plan Note (Signed)
Greg Flowers states that approximately 2 weeks ago, he developed a nonproductive cough with fever, nausea, diarrhea, rhinorrhea and generalized weakness.  He also endorses decreased appetite.  He states that all of his symptoms have begun to improve but he continues to have a persistent cough that has now become productive.  He endorses occasional nausea but no additional diarrhea.  His sputum is greenish-yellow.  He denies any chest pain or difficulty breathing.  Assessment/plan: Patient symptoms consistent with a viral URI.  No suspicion at this time for pneumonia given lack of shortness of breath, however counseled extensively to contact her clinic or go to an ED immediately should he develop any shortness of breath.  He expressed understanding.  We will treat his cough symptomatically as he continues to improve.  - Tessalon Perles and Mucinex sent to pharmacy - Follow-up in 2 weeks if no improvement in symptoms.

## 2021-04-12 NOTE — Progress Notes (Signed)
°  [facility] Internal Medicine Residency Telephone Encounter Continuity Care Appointment  HPI:  This telephone encounter was created for [name] on 04/12/2021 for the following purpose/cc flu-like symptoms.   Past Medical History:  Past Medical History:  Diagnosis Date   Anxiety    Calculus, kidney 2000   Sees Dr. Isabel Flowers, urology.    Cervical spondylosis 2004   sp surgery by Dr. Danielle Flowers   Depression    GERD (gastroesophageal reflux disease)    Headache(784.0)    Chronic, on vicodin 5 TID for this.    Hepatitis C    Has occasional visits with Dr. Randa Flowers GI, failed RX in 2000.  Liver Biopsy 9/06: Minimally active hepatitis consistent with hepatitis C, minimal necroinflamtory activity grade 1, no incrased fibrosis stage 0.    Herpes labialis    History of kidney stones    Hypertension    Pneumonia    Renal stone    Spondylolisthesis of lumbar region    Thalassemia    HGB 9/09 14.4 wtih MCV 72.6   Wears glasses      ROS:  + cough, rhinorrhea, nausea - chest pain, SOB    Assessment / Plan / Recommendations:  Please see A&P under problem oriented charting for assessment of the patient's acute and chronic medical conditions.  As always, pt is advised that if symptoms worsen or new symptoms arise, they should go to an urgent care facility or to to ER for further evaluation.   Consent and Medical Decision Making:  Patient discussed with Greg Flowers This is a telephone encounter between Greg Flowers and Greg Flowers on 04/12/2021 for flu-like symptoms. The visit was conducted with the patient located at home and Greg Flowers at [facility]. The patient's identity was confirmed using their DOB and current address. The patient has consented to being evaluated through a telephone encounter and understands the associated risks (an examination cannot be done and the patient may need to come in for an appointment) / benefits (allows the patient to remain at home, decreasing exposure  to coronavirus). I personally spent 10 minutes on medical discussion.

## 2021-04-17 NOTE — Progress Notes (Signed)
Internal Medicine Clinic Attending  Case discussed with Dr. Basaraba  at the time of the visit.  We reviewed the resident's history and pertinent patient test results.  I agree with the assessment, diagnosis, and plan of care documented in the resident's note.  

## 2021-05-08 ENCOUNTER — Ambulatory Visit: Payer: Federal, State, Local not specified - PPO | Admitting: Internal Medicine

## 2021-05-08 ENCOUNTER — Encounter: Payer: Self-pay | Admitting: Internal Medicine

## 2021-05-08 VITALS — BP 131/91 | HR 73 | Temp 98.4°F | Wt 203.2 lb

## 2021-05-08 DIAGNOSIS — I10 Essential (primary) hypertension: Secondary | ICD-10-CM

## 2021-05-08 DIAGNOSIS — G8929 Other chronic pain: Secondary | ICD-10-CM | POA: Diagnosis not present

## 2021-05-08 DIAGNOSIS — E782 Mixed hyperlipidemia: Secondary | ICD-10-CM

## 2021-05-08 DIAGNOSIS — M545 Low back pain, unspecified: Secondary | ICD-10-CM

## 2021-05-08 MED ORDER — LOSARTAN POTASSIUM 100 MG PO TABS: 100.0000 mg | ORAL_TABLET | Freq: Every day | ORAL | 3 refills | Status: DC

## 2021-05-08 NOTE — Progress Notes (Signed)
° °  CC: BP and back pain  HPI:Greg Flowers is a 66 y.o. male who presents for evaluation of BP and back pain. Please see individual problem based A/P for details.  Depression, PHQ-9: Based on the patients  [name] Visit from 05/25/2020 in [place]  PHQ-9 Total Score 6      score we have 6.  Past Medical History:  Diagnosis Date   Anxiety    Calculus, kidney 2000   Sees Dr. Risa Grill, urology.    Cervical spondylosis 2004   sp surgery by Dr. Ellene Route   Depression    GERD (gastroesophageal reflux disease)    Headache(784.0)    Chronic, on vicodin 5 TID for this.    Hepatitis C    Has occasional visits with Dr. Oletta Lamas GI, failed RX in 2000.  Liver Biopsy 9/06: Minimally active hepatitis consistent with hepatitis C, minimal necroinflamtory activity grade 1, no incrased fibrosis stage 0.    Herpes labialis    History of kidney stones    Hypertension    Pneumonia    Renal stone    Spondylolisthesis of lumbar region    Thalassemia    HGB 9/09 14.4 wtih MCV 72.6   Wears glasses    Review of Systems:   Review of Systems  Constitutional: Negative.   HENT: Negative.    Eyes: Negative.   Respiratory:  Negative for cough, sputum production, shortness of breath and wheezing.   Cardiovascular: Negative.   Skin: Negative.     Physical Exam: There were no vitals filed for this visit.   General: alert and oriented, no acute distress HEENT: Conjunctiva nl , antiicteric sclerae, moist mucous membranes, no exudate or erythema Cardiovascular: Normal rate, regular rhythm.  No murmurs, rubs, or gallops Pulmonary : Equal breath sounds, No wheezes, rales, or rhonchi Abdominal: soft, nontender,  bowel sounds present Ext: No edema in lower extremities, no tenderness to palpation of lower extremities.   Assessment & Plan:   See Encounters Tab for problem based charting.  Patient discussed with Dr. Philipp Ovens

## 2021-05-08 NOTE — Patient Instructions (Addendum)
Dear Mr. Grantham,  Thank you for trusting [place] with your care today.  Today we discussed blood pressure and back pain. We will increase your losartan to 100mg . Please continue taking your other prescriptions. I will reach out to your orthopedic doctors office to obtain records and make a decision regarding pain management from there. We can also place a referral to pain management if you wish.  Otherwise, we will plan to see you back in the office in 1 month for blood pressure check and labs.

## 2021-05-10 NOTE — Addendum Note (Signed)
Addended by: Bufford Spikes on: 05/10/2021 12:13 PM   Modules accepted: Orders

## 2021-05-15 LAB — CMP14 + ANION GAP
ALT: 14 IU/L (ref 0–44)
AST: 15 IU/L (ref 0–40)
Albumin/Globulin Ratio: 1.5 (ref 1.2–2.2)
Albumin: 4.3 g/dL (ref 3.8–4.8)
Alkaline Phosphatase: 66 IU/L (ref 44–121)
Anion Gap: 14 mmol/L (ref 10.0–18.0)
BUN/Creatinine Ratio: 15 (ref 10–24)
BUN: 16 mg/dL (ref 8–27)
Bilirubin Total: 0.2 mg/dL (ref 0.0–1.2)
CO2: 28 mmol/L (ref 20–29)
Calcium: 9.8 mg/dL (ref 8.6–10.2)
Chloride: 99 mmol/L (ref 96–106)
Creatinine, Ser: 1.05 mg/dL (ref 0.76–1.27)
Globulin, Total: 2.9 g/dL (ref 1.5–4.5)
Glucose: 94 mg/dL (ref 70–99)
Potassium: 4.2 mmol/L (ref 3.5–5.2)
Sodium: 141 mmol/L (ref 134–144)
Total Protein: 7.2 g/dL (ref 6.0–8.5)
eGFR: 78 mL/min/{1.73_m2} (ref >59)

## 2021-05-15 LAB — LIPID PANEL
Chol/HDL Ratio: 4.6 ratio (ref 0.0–5.0)
Cholesterol, Total: 153 mg/dL (ref 100–199)
HDL: 33 mg/dL — ABNORMAL LOW (ref >39)
LDL Chol Calc (NIH): 95 mg/dL (ref 0–99)
Triglycerides: 137 mg/dL (ref 0–149)
VLDL Cholesterol Cal: 25 mg/dL (ref 5–40)

## 2021-05-15 LAB — ALDOSTERONE + RENIN ACTIVITY W/ RATIO
ALDOS/RENIN RATIO: 19.2 (ref 0.0–30.0)
ALDOSTERONE: 5.6 ng/dL (ref 0.0–30.0)
Renin: 0.291 ng/mL/hr (ref 0.167–5.380)

## 2021-05-17 ENCOUNTER — Encounter: Payer: Self-pay | Admitting: Internal Medicine

## 2021-05-17 NOTE — Assessment & Plan Note (Signed)
Patient reports compliance with medications. Does not check BP at home. Asymptomatic. BP elevated. Increased losartan to 100mg . Will continue hctz, amlodipine, and atenolol. Will plan to check BMP at follow up appointment.

## 2021-05-17 NOTE — Assessment & Plan Note (Signed)
Reports compliance with statin.  Lipid panel checked with elevated ASCVD risk. Will need to increase statin to moderate-high intensity.

## 2021-05-17 NOTE — Assessment & Plan Note (Signed)
Patient has been taking gabapentin 600 TID  And cymbalto 60mg  with no relief of back pain. He has been seeing [facility] for spinal injections which do provide some relief. He has been on Norco in the past but voided his pain contract when UDS returned positive for cocaine. He is requesting tylenol #3 or tramadol today. He defrs pain clinic referral for new, stating he prefers to have pain contract through our clinic if able. Requesting records from ortho prior to making decision regarding pain management.

## 2021-05-18 ENCOUNTER — Encounter: Payer: Self-pay | Admitting: Internal Medicine

## 2021-05-18 NOTE — Progress Notes (Signed)
Internal Medicine Clinic Attending ° °Case discussed with Dr. Gawaluck  At the time of the visit.  We reviewed the resident’s history and exam and pertinent patient test results.  I agree with the assessment, diagnosis, and plan of care documented in the resident’s note.  °

## 2021-06-18 ENCOUNTER — Other Ambulatory Visit: Payer: Self-pay | Admitting: Student

## 2021-06-18 ENCOUNTER — Other Ambulatory Visit: Payer: Self-pay | Admitting: Internal Medicine

## 2021-06-18 DIAGNOSIS — I10 Essential (primary) hypertension: Secondary | ICD-10-CM

## 2021-06-19 ENCOUNTER — Telehealth: Payer: Self-pay | Admitting: *Deleted

## 2021-06-19 NOTE — Telephone Encounter (Signed)
Call to patient to ask about his need for refill on Benzonatate 200 mg capsules.  Unable to reach .  Msg left for patient to call the Clinics.   ?

## 2021-06-19 NOTE — Telephone Encounter (Signed)
Losartan dosage has been changed to 100 mg.   ?

## 2021-06-19 NOTE — Telephone Encounter (Signed)
C/O coughing up greenish tint phlegm and nasal greenish tint. Has had diarrhea since Saturday. Has been in bed since Saturday evening.   No vomiting . No fevers but chills.  Has been taking Nyquil, Chest All cough syrup.  Feels fatigue.   Sneezing , runny nose and congestion.as well.  Will do an at home Covid Test.  Stomach aches on right side when he coughs. ?

## 2021-06-19 NOTE — Telephone Encounter (Signed)
Pt returning a call back about his medications. ?

## 2021-06-21 ENCOUNTER — Other Ambulatory Visit: Payer: Self-pay | Admitting: Internal Medicine

## 2021-06-21 ENCOUNTER — Telehealth: Payer: Self-pay | Admitting: *Deleted

## 2021-06-21 DIAGNOSIS — I10 Essential (primary) hypertension: Secondary | ICD-10-CM

## 2021-06-21 NOTE — Telephone Encounter (Signed)
RTC to patient who called today to check on prescriptions for medications for Sinus Congestion and cough.  Is hoarse.  Has Nasal congestion and coughing up green mucous.    Some Sneezing as well.  No fevers.  Covid Test was negative on Monday.  Would also like refills of his blood pressure medications as well.   Would like a call from doctor today if possible.   ?

## 2021-06-21 NOTE — Telephone Encounter (Signed)
Patient is scheduled for a 2:45 PM appointment on tomorrow with Dr. Burnice Logan. ?

## 2021-06-21 NOTE — Telephone Encounter (Signed)
Dr. Burnice Logan will be able to refill his antihypertensives today. For the URI symptoms, we should offer this person a tele visit at least to discuss supportive measures, especially if he is expecting prescription medications. ?

## 2021-06-22 ENCOUNTER — Ambulatory Visit (INDEPENDENT_AMBULATORY_CARE_PROVIDER_SITE_OTHER): Payer: Federal, State, Local not specified - PPO | Admitting: Internal Medicine

## 2021-06-22 DIAGNOSIS — J069 Acute upper respiratory infection, unspecified: Secondary | ICD-10-CM

## 2021-06-22 MED ORDER — DEXTROMETHORPHAN-GUAIFENESIN 10-100 MG/5ML PO SYRP: 5.0000 mL | ORAL_SOLUTION | Freq: Two times a day (BID) | ORAL | 0 refills | Status: AC

## 2021-06-22 NOTE — Progress Notes (Signed)
?  [facility] Telephone Encounter ?Continuity Care Appointment ? ?HPI:  ?This telephone encounter was created for [name] on 06/23/2021 for the following purpose/cc viral URI symptoms.  ? ?Past Medical History:  ?Past Medical History:  ?Diagnosis Date  ? Anxiety   ? Calculus, kidney 2000  ? Sees Dr. Risa Grill, urology.   ? Cervical spondylosis 2004  ? sp surgery by Dr. Ellene Route  ? Depression   ? GERD (gastroesophageal reflux disease)   ? Headache(784.0)   ? Chronic, on vicodin 5 TID for this.   ? Hepatitis C   ? Has occasional visits with Dr. Oletta Lamas GI, failed RX in 2000.  Liver Biopsy 9/06: Minimally active hepatitis consistent with hepatitis C, minimal necroinflamtory activity grade 1, no incrased fibrosis stage 0.   ? Herpes labialis   ? History of kidney stones   ? Hypertension   ? Pneumonia   ? Renal stone   ? Spondylolisthesis of lumbar region   ? Thalassemia   ? HGB 9/09 14.4 wtih MCV 72.6  ? Wears glasses   ?  ? ?ROS:  ?See HPI  ? ?Assessment / Plan / Recommendations:  ?Please see A&P under problem oriented charting for assessment of the patient's acute and chronic medical conditions.  ?As always, pt is advised that if symptoms worsen or new symptoms arise, they should go to an urgent care facility or to to ER for further evaluation.  ? ?Consent and Medical Decision Making:  ?Patient discussed with Dr.  Cain Sieve ?This is a telephone encounter between Greg Flowers and Delene Ruffini on 06/23/2021 for URI symptoms. The visit was conducted with the patient located at home and Delene Ruffini at [facility]. The patient's identity was confirmed using their DOB and current address. The patient has consented to being evaluated through a telephone encounter and understands the associated risks (an examination cannot be done and the patient may need to come in for an appointment) / benefits (allows the patient to remain at home, decreasing exposure to coronavirus). I personally spent 15  minutes on medical discussion.   ? ? ?

## 2021-06-23 ENCOUNTER — Encounter: Payer: Self-pay | Admitting: Internal Medicine

## 2021-06-23 NOTE — Assessment & Plan Note (Signed)
Patient reports 1 week ago on thursday he was visiting his grandchildren out of town. Reports grandchildren were sick and he was in close contact with them. Started to develop symptoms by Saturday evening. Reports sx include sneeze, cough, chills, decreased appetite, though he has been able to eat soup and drink ginger ale. He has been taking nyquil and benadryl for symptoms with little relief.  ?Reports greenish nasal discharge and greenish sputum production with cough. No shortness of breath. He has felt warm but denies fevers. Does endorse some chills and diaphoresis. Also endorses two days of diarrhea which has since resolved. He took an at home COVID test which was negative. He states he is up to date on covid and flu.  ?He states his wife is able to assist if he needs it.  ?Likely viral infection given timing and recent sick contacts.  ?Advised supportive care. Rx sent for robitussin DM. Also advised flonase and tylenol. Recommended that if symptoms persist he should contact our office again and to present to UC or ED if they worsen or if he developed shortness of breath.  ?

## 2021-06-25 ENCOUNTER — Emergency Department (HOSPITAL_BASED_OUTPATIENT_CLINIC_OR_DEPARTMENT_OTHER): Payer: Federal, State, Local not specified - PPO | Admitting: Radiology

## 2021-06-25 ENCOUNTER — Other Ambulatory Visit: Payer: Self-pay

## 2021-06-25 ENCOUNTER — Emergency Department (HOSPITAL_BASED_OUTPATIENT_CLINIC_OR_DEPARTMENT_OTHER)
Admission: EM | Admit: 2021-06-25 | Discharge: 2021-06-25 | Disposition: A | Discharge status: Home / Self Care | Payer: Federal, State, Local not specified - PPO | Attending: Emergency Medicine | Admitting: Emergency Medicine

## 2021-06-25 DIAGNOSIS — J189 Pneumonia, unspecified organism: Secondary | ICD-10-CM | POA: Diagnosis not present

## 2021-06-25 DIAGNOSIS — Z20822 Contact with and (suspected) exposure to covid-19: Secondary | ICD-10-CM | POA: Diagnosis not present

## 2021-06-25 DIAGNOSIS — R079 Chest pain, unspecified: Secondary | ICD-10-CM | POA: Diagnosis present

## 2021-06-25 LAB — CBC WITH DIFFERENTIAL/PLATELET
Abs Immature Granulocytes: 0.11 10*3/uL — ABNORMAL HIGH (ref 0.00–0.07)
Basophils Absolute: 0.1 10*3/uL (ref 0.0–0.1)
Basophils Relative: 0 %
Eosinophils Absolute: 0.1 10*3/uL (ref 0.0–0.5)
Eosinophils Relative: 1 %
HCT: 39.4 % (ref 39.0–52.0)
Hemoglobin: 12.7 g/dL — ABNORMAL LOW (ref 13.0–17.0)
Immature Granulocytes: 1 %
Lymphocytes Relative: 15 %
Lymphs Abs: 2 10*3/uL (ref 0.7–4.0)
MCH: 22.1 pg — ABNORMAL LOW (ref 26.0–34.0)
MCHC: 32.2 g/dL (ref 30.0–36.0)
MCV: 68.5 fL — ABNORMAL LOW (ref 80.0–100.0)
Monocytes Absolute: 1.5 10*3/uL — ABNORMAL HIGH (ref 0.1–1.0)
Monocytes Relative: 11 %
Neutro Abs: 9.8 10*3/uL — ABNORMAL HIGH (ref 1.7–7.7)
Neutrophils Relative %: 72 %
Platelets: 303 10*3/uL (ref 150–400)
RBC: 5.75 MIL/uL (ref 4.22–5.81)
RDW: 13.7 % (ref 11.5–15.5)
WBC: 13.6 10*3/uL — ABNORMAL HIGH (ref 4.0–10.5)
nRBC: 0 % (ref 0.0–0.2)

## 2021-06-25 LAB — URINALYSIS, ROUTINE W REFLEX MICROSCOPIC
Glucose, UA: NEGATIVE mg/dL
Hgb urine dipstick: NEGATIVE
Ketones, ur: NEGATIVE mg/dL
Leukocytes,Ua: NEGATIVE
Nitrite: NEGATIVE
Protein, ur: 30 mg/dL — AB
Specific Gravity, Urine: 1.02 (ref 1.005–1.030)
pH: 6 (ref 5.0–8.0)

## 2021-06-25 LAB — COMPREHENSIVE METABOLIC PANEL
ALT: 31 U/L (ref 0–44)
AST: 28 U/L (ref 15–41)
Albumin: 4 g/dL (ref 3.5–5.0)
Alkaline Phosphatase: 53 U/L (ref 38–126)
Anion gap: 15 (ref 5–15)
BUN: 14 mg/dL (ref 8–23)
CO2: 27 mmol/L (ref 22–32)
Calcium: 9.6 mg/dL (ref 8.9–10.3)
Chloride: 90 mmol/L — ABNORMAL LOW (ref 98–111)
Creatinine, Ser: 1.15 mg/dL (ref 0.61–1.24)
GFR, Estimated: >60 mL/min (ref >60)
Glucose, Bld: 125 mg/dL — ABNORMAL HIGH (ref 70–99)
Potassium: 2.9 mmol/L — ABNORMAL LOW (ref 3.5–5.1)
Sodium: 132 mmol/L — ABNORMAL LOW (ref 135–145)
Total Bilirubin: 0.6 mg/dL (ref 0.3–1.2)
Total Protein: 8.2 g/dL — ABNORMAL HIGH (ref 6.5–8.1)

## 2021-06-25 LAB — URINALYSIS, MICROSCOPIC (REFLEX)
Bacteria, UA: NONE SEEN
Squamous Epithelial / HPF: NONE SEEN (ref 0–5)

## 2021-06-25 LAB — RESP PANEL BY RT-PCR (FLU A&B, COVID) ARPGX2
Influenza A by PCR: NEGATIVE
Influenza B by PCR: NEGATIVE
SARS Coronavirus 2 by RT PCR: NEGATIVE

## 2021-06-25 LAB — LACTIC ACID, PLASMA
Lactic Acid, Venous: 2.1 mmol/L (ref 0.5–1.9)
Lactic Acid, Venous: 0.7 mmol/L (ref 0.5–1.9)

## 2021-06-25 MED ORDER — POTASSIUM CHLORIDE CRYS ER 20 MEQ PO TBCR
40.0000 meq | EXTENDED_RELEASE_TABLET | Freq: Once | ORAL | Status: AC
Start: 2021-06-25 — End: 2021-06-25
  Administered 2021-06-25: 40 meq via ORAL
  Filled 2021-06-25: qty 2

## 2021-06-25 MED ORDER — HYDROCODONE BIT-HOMATROP MBR 5-1.5 MG/5ML PO SOLN: 5.0000 mL | ORAL | 0 refills | Status: DC | PRN

## 2021-06-25 MED ORDER — IPRATROPIUM-ALBUTEROL 0.5-2.5 (3) MG/3ML IN SOLN
3.0000 mL | Freq: Once | RESPIRATORY_TRACT | Status: AC
  Administered 2021-06-25: 3 mL via RESPIRATORY_TRACT
  Filled 2021-06-25: qty 3

## 2021-06-25 MED ORDER — BENZONATATE 100 MG PO CAPS: 200.0000 mg | ORAL_CAPSULE | Freq: Three times a day (TID) | ORAL | 0 refills | Status: DC | PRN

## 2021-06-25 MED ORDER — ALBUTEROL SULFATE HFA 108 (90 BASE) MCG/ACT IN AERS
2.0000 | INHALATION_SPRAY | Freq: Once | RESPIRATORY_TRACT | Status: AC
Start: 2021-06-25 — End: 2021-06-25
  Administered 2021-06-25: 2 via RESPIRATORY_TRACT
  Filled 2021-06-25: qty 6.7

## 2021-06-25 MED ORDER — LORAZEPAM 2 MG/ML IJ SOLN
0.5000 mg | Freq: Once | INTRAMUSCULAR | Status: AC
  Administered 2021-06-25: 0.5 mg via INTRAVENOUS
  Filled 2021-06-25: qty 1

## 2021-06-25 MED ORDER — ACETAMINOPHEN 500 MG PO TABS
1000.0000 mg | ORAL_TABLET | Freq: Once | ORAL | Status: AC
  Administered 2021-06-25: 1000 mg via ORAL
  Filled 2021-06-25: qty 2

## 2021-06-25 MED ORDER — SODIUM CHLORIDE 0.9 % IV SOLN
1000.0000 mL | INTRAVENOUS | Status: DC
  Administered 2021-06-25: 1000 mL via INTRAVENOUS

## 2021-06-25 MED ORDER — AMLODIPINE BESYLATE 5 MG PO TABS
10.0000 mg | ORAL_TABLET | Freq: Once | ORAL | Status: AC
  Administered 2021-06-25: 10 mg via ORAL
  Filled 2021-06-25: qty 2

## 2021-06-25 MED ORDER — DOXYCYCLINE HYCLATE 100 MG PO CAPS: 100.0000 mg | ORAL_CAPSULE | Freq: Two times a day (BID) | ORAL | 0 refills | Status: DC

## 2021-06-25 MED ORDER — ATENOLOL 25 MG PO TABS
50.0000 mg | ORAL_TABLET | Freq: Once | ORAL | Status: AC
Start: 2021-06-25 — End: 2021-06-25
  Administered 2021-06-25: 50 mg via ORAL
  Filled 2021-06-25: qty 2

## 2021-06-25 MED ORDER — SODIUM CHLORIDE 0.9 % IV BOLUS (SEPSIS)
1000.0000 mL | Freq: Once | INTRAVENOUS | Status: AC
  Administered 2021-06-25: 1000 mL via INTRAVENOUS

## 2021-06-25 MED ORDER — DOXYCYCLINE HYCLATE 100 MG PO TABS
100.0000 mg | ORAL_TABLET | Freq: Once | ORAL | Status: AC
  Administered 2021-06-25: 100 mg via ORAL
  Filled 2021-06-25: qty 1

## 2021-06-25 MED ORDER — POTASSIUM CHLORIDE 10 MEQ/100ML IV SOLN
10.0000 meq | Freq: Once | INTRAVENOUS | Status: AC
  Administered 2021-06-25: 10 meq via INTRAVENOUS
  Filled 2021-06-25: qty 100

## 2021-06-25 MED ORDER — SODIUM CHLORIDE 0.9 % IV BOLUS: 500.0000 mL | Freq: Once | INTRAVENOUS | Status: DC

## 2021-06-25 NOTE — ED Provider Notes (Signed)
[facility] EMERGENCY DEPT Provider Note   CSN: [id number] Arrival date & time: 06/25/21  1551     History  Chief Complaint  Patient presents with   Hyperventilating   Chest Pain    Greg Flowers is a 66 y.o. male.  HPI Patient reports he went to [place] to help take care of grandchildren last weekend.  He reports the grandchildren seem to have cough and cold symptoms.  He reports that he started developing cough and cold symptoms as well.  He first had nasal congestion and drainage and stuffiness.  He then began coughing.  Patient reports cough has become productive of greenish and yellow sputum.  Cough has become increasingly harsh and he started to feel short of breath and is getting harsh coughing spells.  He denies he is having any vomiting.  He is not having any swelling of the legs or pain in the calves.  Patient has a very brief distant smoking history and the 1980s.  Essentially a non-smoker.  He denies history of COPD or use of nebulizers or inhalers on routine basis.  Patient reports after a lot of coughing his chest feels tight and congested.  He reports he has been trying over-the-counter Robitussin and NyQuil but the symptoms are not getting any better and he just feels like he is persistently getting worse.  He reports now he feels fatigued and he thinks he is having fevers and chills.  Patient reports due to the symptoms he has not had any of his medications and a number of days.  Probably at least 3 days.  Reports he has not had any of his anti-hypertensive medication    Home Medications Prior to Admission medications   Medication Sig Start Date End Date Taking? Authorizing Provider  allopurinol (ZYLOPRIM) 100 MG tablet TAKE 1/2 TABLET BY MOUTH EVERY DAY 02/09/21   Virl Axe, MD  benzonatate (TESSALON) 100 MG capsule Take 2 capsules (200 mg total) by mouth 3 (three) times daily as needed for cough. 06/25/21  Yes Charlesetta Shanks, MD  doxycycline  (VIBRAMYCIN) 100 MG capsule Take 1 capsule (100 mg total) by mouth 2 (two) times daily. One po bid x 7 days 06/25/21  Yes Jerell Demery, Jeannie Done, MD  HYDROcodone bit-homatropine (HYCODAN) 5-1.5 MG/5ML syrup Take 5 mLs by mouth every 4 (four) hours as needed for cough. 06/25/21  Yes Boden Stucky, Jeannie Done, MD  amLODipine (NORVASC) 10 MG tablet TAKE 1 TABLET BY MOUTH EVERY DAY 06/22/21   Delene Ruffini, MD  APPLE CIDER VINEGAR PO Take 480 mg by mouth daily.    [provider]  atenolol (TENORMIN) 50 MG tablet TAKE 1 TABLET BY MOUTH EVERY DAY 02/22/21   Orvis Brill, MD  atorvastatin (LIPITOR) 10 MG tablet Take 1 tablet (10 mg total) by mouth daily. 12/30/20 12/30/21  Delene Ruffini, MD  b complex vitamins tablet Take 1 tablet by mouth daily.    [provider]  benzonatate (TESSALON) 200 MG capsule Take 1 capsule (200 mg total) by mouth 3 (three) times daily as needed for cough. 04/12/21   Jose Persia, MD  Dextromethorphan-guaiFENesin 10-100 MG/5ML liquid Take 5 mLs by mouth every 12 (twelve) hours for 7 days. 06/22/21 06/29/21  Delene Ruffini, MD  DULoxetine (CYMBALTA) 30 MG capsule Take 1 capsule (30 mg total) by mouth daily. 02/22/21 02/22/22  Orvis Brill, MD  ferrous sulfate 325 (65 FE) MG tablet Take 325 mg by mouth daily with breakfast.    [provider]  gabapentin (  NEURONTIN) 300 MG capsule Take 2 capsules (600 mg total) by mouth 3 (three) times daily. 11/24/20   Jose Persia, MD  Garlic [id number] MG TBEC Take 2,000 mg by mouth daily.    [provider]  guaiFENesin (MUCINEX) 600 MG 12 hr tablet Take 1 tablet (600 mg total) by mouth 2 (two) times daily. 04/12/21   Jose Persia, MD  Honey 5 GM/5ML SYRP Take by mouth.    [provider]  hydrochlorothiazide (HYDRODIURIL) 25 MG tablet Take 1 tablet (25 mg total) by mouth daily. 08/18/20   Riesa Pope, MD  losartan (COZAAR) 100 MG tablet Take 1 tablet (100 mg total) by mouth daily. 05/08/21  06/07/21  Delene Ruffini, MD  losartan (COZAAR) 50 MG tablet TAKE 1 TABLET BY MOUTH EVERY DAY 06/22/21   Delene Ruffini, MD  Multiple Vitamin (MULTIVITAMIN WITH MINERALS) TABS tablet Take 1 tablet by mouth daily.    [provider]  pantoprazole (PROTONIX) 40 MG tablet Take 40 mg by mouth daily.    [provider]  promethazine (PHENERGAN) 12.5 MG tablet Take 12.5 mg by mouth every 6 (six) hours as needed. 05/11/19   [provider]  sodium chloride (OCEAN) 0.65 % nasal spray Place into the nose.    [provider]  Tamsulosin HCl (FLOMAX) 0.4 MG CAPS Take 0.8 mg by mouth daily.     [provider]  tiZANidine (ZANAFLEX) 4 MG capsule Take 4 mg by mouth 3 (three) times daily as needed.    [provider]      Allergies    Patient has no known allergies.    Review of Systems   Review of Systems 10 systems reviewed and negative except as per HPI Physical Exam Updated Vital Signs BP (!) 138/104    Pulse 86    Temp (!) 100.7 F (38.2 C) (Oral)    Resp 18    Ht 5\' 8"  (1.727 m)    Wt 90.7 kg    SpO2 96%    BMI 30.41 kg/m  Physical Exam Constitutional:      Comments: Patient is alert.  He is anxious.  He is breathing rapidly but does not appear to have respiratory fatigue or distress.  Color is good.  He is speaking in full sentences.  He intermittently has cough paroxysms  HENT:     Mouth/Throat:     Mouth: Mucous membranes are moist.     Pharynx: Oropharynx is clear.  Eyes:     Extraocular Movements: Extraocular movements intact.  Cardiovascular:     Comments: Tachycardia no appreciable rub murmur gallop Pulmonary:     Comments: Tachypnea.  Inspiratory expiratory wheeze bilaterally.  Harsh, bronchitic cough with deep inspiration. Abdominal:     General: There is no distension.     Palpations: Abdomen is soft.     Tenderness: There is no abdominal tenderness. There is no guarding.  Musculoskeletal:     Comments: No peripheral  edema.  Calves are soft and nontender.  Skin is good condition warm and dry extremities.  Skin:    General: Skin is warm and dry.  Neurological:     General: No focal deficit present.     Mental Status: He is oriented to person, place, and time.     Motor: No weakness.     Coordination: Coordination normal.  Psychiatric:     Comments: Patient is anxious but pleasantly interactive.    ED Results / Procedures / Treatments   Labs (all  labs ordered are listed, but only abnormal results are displayed) Labs Reviewed  LACTIC ACID, PLASMA - Abnormal; Notable for the following components:      Result Value   Lactic Acid, Venous 2.1 (*)    All other components within normal limits  COMPREHENSIVE METABOLIC PANEL - Abnormal; Notable for the following components:   Sodium 132 (*)    Potassium 2.9 (*)    Chloride 90 (*)    Glucose, Bld 125 (*)    Total Protein 8.2 (*)    All other components within normal limits  CBC WITH DIFFERENTIAL/PLATELET - Abnormal; Notable for the following components:   WBC 13.6 (*)    Hemoglobin 12.7 (*)    MCV 68.5 (*)    MCH 22.1 (*)    Neutro Abs 9.8 (*)    Monocytes Absolute 1.5 (*)    Abs Immature Granulocytes 0.11 (*)    All other components within normal limits  URINALYSIS, ROUTINE W REFLEX MICROSCOPIC - Abnormal; Notable for the following components:   Bilirubin Urine SMALL (*)    Protein, ur 30 (*)    All other components within normal limits  RESP PANEL BY RT-PCR (FLU A&B, COVID) ARPGX2  LACTIC ACID, PLASMA  URINALYSIS, MICROSCOPIC (REFLEX)    EKG None EKG is not importing from [facility] Sinus rhythm 110.  Left axis deviation. No acute ischemic changes.  No significant change compared to previous except increased rate. Radiology DG Chest 2 View  Result Date: 06/25/2021 CLINICAL DATA:  Cough EXAM: CHEST - 2 VIEW COMPARISON:  11/14/2018 FINDINGS: Bibasilar atelectasis. Heart is normal size. No effusions or acute bony abnormality. IMPRESSION:  Bibasilar atelectasis. Electronically Signed   By: Rolm Baptise M.D.   On: 06/25/2021 17:02    Procedures Procedures   Multiple occasions of interpretation monitor.  Narrow complex sinus rhythm initially tachycardic rates up to 120s.  Repeat evaluation after medications and reassessment.  Sinus rhythm rate of 86. Medications Ordered in ED Medications  sodium chloride 0.9 % bolus 1,000 mL (0 mLs Intravenous Stopped 06/25/21 1859)    Followed by  0.9 %  sodium chloride infusion (0 mLs Intravenous Stopped 06/25/21 2001)  sodium chloride 0.9 % bolus 500 mL (0 mLs Intravenous Stopped 06/25/21 2031)  albuterol (VENTOLIN HFA) 108 (90 Base) MCG/ACT inhaler 2 puff (has no administration in time range)  potassium chloride SA (KLOR-CON M) CR tablet 40 mEq (40 mEq Oral Given 06/25/21 1802)  potassium chloride 10 mEq in 100 mL IVPB (0 mEq Intravenous Stopped 06/25/21 1859)  LORazepam (ATIVAN) injection 0.5 mg (0.5 mg Intravenous Given 06/25/21 1757)  acetaminophen (TYLENOL) tablet 1,000 mg (1,000 mg Oral Given 06/25/21 1928)  ipratropium-albuterol (DUONEB) 0.5-2.5 (3) MG/3ML nebulizer solution 3 mL (3 mLs Nebulization Given 06/25/21 1903)  doxycycline (VIBRA-TABS) tablet 100 mg (100 mg Oral Given 06/25/21 1928)  ipratropium-albuterol (DUONEB) 0.5-2.5 (3) MG/3ML nebulizer solution 3 mL (3 mLs Nebulization Given 06/25/21 1951)  atenolol (TENORMIN) tablet 50 mg (50 mg Oral Given 06/25/21 1955)  amLODipine (NORVASC) tablet 10 mg (10 mg Oral Given 06/25/21 1954)    ED Course/ Medical Decision Making/ A&P                           Medical Decision Making Amount and/or Complexity of Data Reviewed Labs: ordered. Radiology: ordered.  Risk OTC drugs. Prescription drug management.   Patient presents with complaints of upper respiratory symptoms followed by cough that is productive plus wheezing and low-grade  fever.  Patient has not taken his blood pressure medications for several days reportedly due to the  symptoms.  He is tachycardic and tachypneic.  Will initiate diagnostic evaluation for pneumonia\bronchitis.  Lower suspicion at this time for PE or ACS with distinct history of URI symptoms following direct exposure.  We will continue to monitor for these diagnoses.  Patient was treated with albuterol nebulizer therapy with improvement.  Wheezing improved.  Patient did develop low-grade fever 100.7.  Lactic acid mildly elevated.  At this time fluid resuscitation initiated.  Patient also was hypokalemic.  This appears multifactorial.  Patient was hyperventilating significantly on arrival and tearful.  Also reporting poor oral intake for several days.  Will replace both orally and IV.  Chest x-ray reviewed with images examined by myself.  I have reviewed radiologist interpretation.  At this time with symptoms suggestive of community-acquired pneumonia productive cough plus low-grade fever plus radiology interpretation of atelectasis, will proceed with administration of doxycycline.  Patient also has not taken any of his rate control medications.  He is supposed to be taking atenolol and antihypertensives.  Administration of home medications.  On reassessment patient's blood pressure has improved and heart rate is normalized.  He is well in appearance.  No longer tachypneic or hyperventilating.  Mental status clear.  Lactic acid has normalized.  EKG does not show any changes from previous.  At this time I feel patient is appropriate for continued outpatient management.  Plan will be to continue albuterol inhaler, doxycycline and cough suppressant.  Patient is encouraged to continue taking his antihypertensives to control blood pressure and heart rate.  Patient is advised for close follow-up.  Careful return precautions reviewed.        Final Clinical Impression(s) / ED Diagnoses Final diagnoses:  Community acquired pneumonia, unspecified laterality    Rx / DC Orders ED Discharge Orders           Ordered    doxycycline (VIBRAMYCIN) 100 MG capsule  2 times daily        06/25/21 2251    benzonatate (TESSALON) 100 MG capsule  3 times daily PRN        06/25/21 2251    HYDROcodone bit-homatropine (HYCODAN) 5-1.5 MG/5ML syrup  Every 4 hours PRN        06/25/21 2251              Charlesetta Shanks, MD 06/25/21 2305

## 2021-06-25 NOTE — ED Triage Notes (Signed)
Patient reports to the ER for chest cold symptoms. Patient is hyperventilating in triage but oxygen is 100% on RA. Patient reports he called his doctor at [facility] and was diagnosed with a Viral Respiratory infection. Patient reports he was told to take robitussin DM and Nyquil.  ?

## 2021-06-25 NOTE — ED Notes (Signed)
Inhaler/spacer given for home use. Patient education performed, patient demonstrated understanding of inhaler/spacer use. Able to perform independently. ?

## 2021-06-25 NOTE — Discharge Instructions (Signed)
1.  Continue doxycycline as prescribed.  You are given your first dose in the emergency department.  You should start that antibiotic tomorrow morning. ?2.  You are given an albuterol inhaler from the emergency department.  Use this with the spacer as instructed.  You may take 2 puffs every 2 hours if needed.  Try to use this every 4 hours for the next 2 to 3 days until your symptoms are improving.  Then you may use every 4 hours as needed. ?3.  You have been given 2 types of cough medications.  Tessalon Perles are meant to be swallowed whole.  Do not suck on them or chew on them.  Swallow them whole to help with cough.  You may also take the Hycodan syrup prescribed.  Take 1 teaspoon every 4 hours as needed for cough. ?4.  Be sure to resume all of your normal blood pressure medications. ?5.  Take Tylenol every 6 hours if you have fever or body aches. ?6.  Schedule an appointment to see your doctor within the next 1 to 3 days.  If your symptoms are worsening return to the emergency department. ?

## 2021-06-25 NOTE — ED Notes (Signed)
CRITICAL VALUE STICKER ? ?CRITICAL VALUE:Lactic acid 2.1 ? ?RECEIVER (on-site recipient of call):Carmon Ginsberg, RN ? ?DATE & TIME NOTIFIED: 06-25-2021 1655 ? ?MESSENGER (representative from lab): ? ?MD NOTIFIED: Dr. Donnald Garre ? ?TIME OF NOTIFICATION:1655 ? ?RESPONSE:  ? ?

## 2021-06-25 NOTE — ED Notes (Signed)
Patient crying and gasping in triage that he yelled at his wife and came up here on his own and is upset. Patient reports he has been "so sick" and she went to church today and left him home alone and sick. He reports having to brush his teeth and get dressed on his own since she left him at home to fend for himself. Patient is visibly upset at this and hyperventilating. RN and EMT attempted to calm down patient with little success.  ?

## 2021-06-26 NOTE — Progress Notes (Signed)
Internal Medicine Clinic Attending ° °Case discussed with Dr. Gawaluck  At the time of the visit.  We reviewed the resident’s history and exam and pertinent patient test results.  I agree with the assessment, diagnosis, and plan of care documented in the resident’s note.  °

## 2021-08-13 ENCOUNTER — Other Ambulatory Visit: Payer: Self-pay | Admitting: Student

## 2021-08-13 ENCOUNTER — Other Ambulatory Visit: Payer: Self-pay | Admitting: Internal Medicine

## 2021-08-13 DIAGNOSIS — I10 Essential (primary) hypertension: Secondary | ICD-10-CM

## 2021-08-13 DIAGNOSIS — M10379 Gout due to renal impairment, unspecified ankle and foot: Secondary | ICD-10-CM

## 2021-08-17 NOTE — Telephone Encounter (Signed)
Please have pt follow up in [facility] for BP recheck when able.

## 2021-08-23 ENCOUNTER — Other Ambulatory Visit: Payer: Self-pay | Admitting: Internal Medicine

## 2021-11-12 ENCOUNTER — Other Ambulatory Visit: Payer: Self-pay | Admitting: Internal Medicine

## 2021-11-12 ENCOUNTER — Telehealth: Payer: Self-pay | Admitting: Internal Medicine

## 2021-11-12 DIAGNOSIS — I10 Essential (primary) hypertension: Secondary | ICD-10-CM

## 2021-11-14 NOTE — Telephone Encounter (Signed)
Patient sent message via my chart to contact our office to schedule an appointment for a blood pressure check per Dr. Kirke Corin.

## 2022-01-10 ENCOUNTER — Other Ambulatory Visit: Payer: Self-pay | Admitting: Internal Medicine

## 2022-01-10 NOTE — Telephone Encounter (Signed)
Patient sent message via my chart to contact our office to schedule an appointment to see Dr. Elliot Gurney.

## 2022-01-31 ENCOUNTER — Other Ambulatory Visit: Payer: Self-pay

## 2022-01-31 DIAGNOSIS — I10 Essential (primary) hypertension: Secondary | ICD-10-CM

## 2022-02-01 ENCOUNTER — Ambulatory Visit (INDEPENDENT_AMBULATORY_CARE_PROVIDER_SITE_OTHER): Payer: Federal, State, Local not specified - PPO | Admitting: *Deleted

## 2022-02-01 DIAGNOSIS — Z23 Encounter for immunization: Secondary | ICD-10-CM | POA: Diagnosis not present

## 2022-02-08 MED ORDER — GABAPENTIN 300 MG PO CAPS: 600.0000 mg | ORAL_CAPSULE | Freq: Three times a day (TID) | ORAL | 3 refills | Status: DC

## 2022-02-08 MED ORDER — AMLODIPINE BESYLATE 10 MG PO TABS: 10.0000 mg | ORAL_TABLET | Freq: Every day | ORAL | 3 refills | Status: DC

## 2022-02-08 MED ORDER — ATORVASTATIN CALCIUM 10 MG PO TABS: 10.0000 mg | ORAL_TABLET | Freq: Every day | ORAL | 3 refills | Status: DC

## 2022-02-08 MED ORDER — LOSARTAN POTASSIUM 100 MG PO TABS: 100.0000 mg | ORAL_TABLET | Freq: Every day | ORAL | 3 refills | Status: DC

## 2022-02-08 MED ORDER — HYDROCHLOROTHIAZIDE 25 MG PO TABS: 25.0000 mg | ORAL_TABLET | Freq: Every day | ORAL | 1 refills | Status: DC

## 2022-02-08 MED ORDER — ATENOLOL 50 MG PO TABS: ORAL_TABLET | ORAL | 2 refills | Status: DC

## 2022-02-08 MED ORDER — DULOXETINE HCL 30 MG PO CPEP: ORAL_CAPSULE | ORAL | 1 refills | Status: DC

## 2022-02-12 ENCOUNTER — Ambulatory Visit (INDEPENDENT_AMBULATORY_CARE_PROVIDER_SITE_OTHER): Payer: Federal, State, Local not specified - PPO | Admitting: Internal Medicine

## 2022-02-12 ENCOUNTER — Encounter: Payer: Self-pay | Admitting: Internal Medicine

## 2022-02-12 DIAGNOSIS — Z0000 Encounter for general adult medical examination without abnormal findings: Secondary | ICD-10-CM

## 2022-02-12 DIAGNOSIS — I10 Essential (primary) hypertension: Secondary | ICD-10-CM

## 2022-02-12 NOTE — Assessment & Plan Note (Signed)
Most recent lipid panel in 1/23 was normal. Plan to repeat lipid panel at next in-person follow-up appointment in 2 weeks.

## 2022-02-12 NOTE — Progress Notes (Signed)
I connected with  Greg Flowers on 02/12/22 by telephone and verified that I am speaking with the correct person using two identifiers.   I discussed the limitations of evaluation and management by telemedicine. The patient expressed understanding and agreed to proceed.  CC: Medication refill  This is a telephone encounter between Greg Flowers and [employer] (medical student) who is being supervised by Dr. Rudene Christians on 02/12/2022 for medication refill and inquisition on pain medication. The visit was conducted with the patient located at  home of relative  and Greg Flowers and Dr. Sloan Leiter at [facility]. The patient's identity was confirmed using their name and DOB. The patient has consented to being evaluated through a telephone encounter and understands the associated risks (an examination cannot be done and the patient may need to come in for an appointment) / benefits (allows the patient to remain at home, decreasing exposure to coronavirus). I personally spent 20 minutes on medical discussion.   HPI:  [name] is a 66 y.o. with PMH as below.   He is interested in medication refill and pain contract with [facility]. Please see A&P for assessment of the patient's acute and chronic medical conditions.   Past Medical History:  Diagnosis Date   Anxiety    Calculus, kidney 2000   Sees Dr. Isabel Caprice, urology.    Cervical spondylosis 2004   sp surgery by Dr. Danielle Dess   Depression    GERD (gastroesophageal reflux disease)    Headache(784.0)    Chronic, on vicodin 5 TID for this.    Hepatitis C    Has occasional visits with Dr. Randa Evens GI, failed RX in 2000.  Liver Biopsy 9/06: Minimally active hepatitis consistent with hepatitis C, minimal necroinflamtory activity grade 1, no incrased fibrosis stage 0.    Herpes labialis    HERPES LABIALIS 04/04/2006   Qualifier: Diagnosis of  By: Aundria Rud MD, Roseanne Reno     History of kidney stones    Hypertension    Pneumonia     Renal stone    Spondylolisthesis of lumbar region    Thalassemia    HGB 9/09 14.4 wtih MCV 72.6   Viral upper respiratory illness 04/12/2021   Wears glasses    Review of Systems:   Constitutional: No dizziness GI: Recent stomach cramps and nausea that are resolving  Lipid Panel     Component Value Date/Time   CHOL 153 05/08/2021 1518   TRIG 137 05/08/2021 1518   HDL 33 (L) 05/08/2021 1518   CHOLHDL 4.6 05/08/2021 1518   CHOLHDL 3.7 12/25/2012 1707   VLDL 28 12/25/2012 1707   LDLCALC 95 05/08/2021 1518   LABVLDL 25 05/08/2021 1518     Assessment & Plan:   See Encounters Tab for problem based charting.  Chronic back pain secondary to DJD of cervical, thoracic and lumbar spine and disc herniation  Patient would like pain contract for his back pain. He was on Norco in the past but voided his pain contract when UDS returned positive for cocaine. In process of requesting records from orthopedics.   Explained to patient that will need to have a follow-up in-person appointment in two weeks for physical evaluation including blood pressure to make decision regarding pain management.   Essential hypertension  Patient reports compliance with his medications and is asymptomatic. Patient reports his BP at a recent GI-screen appointment was 135/85 and recently 130/90 at home. BP is not well-controlled with goal of <130/80.  Plan  -Continue losartan  100 mg, hctz, atenolol and amlodipine -Medications refilled -Check BMP at in-person follow-up appointment in 2 weeks  Healthcare maintenance  Most recent lipid panel in 1/23 was normal. Plan to repeat lipid panel at next in-person follow-up appointment in 2 weeks.  Patient discussed with Dr. Larose Kells (Max) Bonifay, MS3 02/12/2022, 6:45 PM

## 2022-02-12 NOTE — Assessment & Plan Note (Signed)
Patient reports compliance with his medications and is asymptomatic. Patient reports his BP at a recent GI-screen appointment was 135/85 and recently 130/90 at home. BP is not well-controlled with goal of <130/80.  Plan  -Continue losartan 100 mg, hctz, atenolol and amlodipine -Medications refilled -Check BMP at in-person follow-up appointment in 2 weeks

## 2022-02-12 NOTE — Assessment & Plan Note (Signed)
Patient would like pain contract for his back pain. He was on Norco in the past but voided his pain contract when UDS returned positive for cocaine. In process of requesting records from orthopedics.   Explained to patient that will need to have a follow-up in-person appointment in two weeks for physical evaluation including blood pressure to make decision regarding pain management.

## 2022-02-14 ENCOUNTER — Other Ambulatory Visit: Payer: Self-pay | Admitting: Internal Medicine

## 2022-02-14 DIAGNOSIS — M10379 Gout due to renal impairment, unspecified ankle and foot: Secondary | ICD-10-CM

## 2022-02-28 ENCOUNTER — Encounter: Payer: Self-pay | Admitting: Internal Medicine

## 2022-03-02 ENCOUNTER — Encounter: Payer: Self-pay | Admitting: Student

## 2022-03-02 ENCOUNTER — Ambulatory Visit (INDEPENDENT_AMBULATORY_CARE_PROVIDER_SITE_OTHER): Payer: Federal, State, Local not specified - PPO | Admitting: Student

## 2022-03-02 ENCOUNTER — Other Ambulatory Visit: Payer: Self-pay

## 2022-03-02 VITALS — BP 117/73 | HR 97 | Temp 98.1°F | Ht 69.0 in | Wt 203.9 lb

## 2022-03-02 DIAGNOSIS — M545 Low back pain, unspecified: Secondary | ICD-10-CM | POA: Diagnosis not present

## 2022-03-02 DIAGNOSIS — Z87891 Personal history of nicotine dependence: Secondary | ICD-10-CM

## 2022-03-02 DIAGNOSIS — I10 Essential (primary) hypertension: Secondary | ICD-10-CM | POA: Diagnosis not present

## 2022-03-02 DIAGNOSIS — M7711 Lateral epicondylitis, right elbow: Secondary | ICD-10-CM | POA: Diagnosis not present

## 2022-03-02 DIAGNOSIS — G8929 Other chronic pain: Secondary | ICD-10-CM | POA: Diagnosis not present

## 2022-03-02 MED ORDER — DULOXETINE HCL 60 MG PO CPEP: ORAL_CAPSULE | ORAL | 1 refills | Status: DC

## 2022-03-02 NOTE — Patient Instructions (Signed)
Greg Flowers,  It was great to see you in clinic today.  Below is what we discussed today:  For lateral epicondylitis -Ice and brace, we have included a reference for further information to help with recovery  For chronic pain -We have increased your Cymbalta dose to 60 mg and we will follow-up in 4 weeks  Please note the following changes to your medications: Cymbalta dose increase from 30 to 60 mg  It is a pleasure to be a part of your team, thank you for allowing [place] to be a part of your care,  Max and Dr. Austin Miles  Please call our clinic if you have any questions or concerns, we may be able to help and keep you from a long and expensive emergency room wait. Our clinic and after hours phone number is [phone], the best time to call is Monday through Friday 9 am to 4 pm but there is always someone available 24/7 if you have an emergency.   If you need medication refills please notify your pharmacy one week in advance and they will send [place] a request.

## 2022-03-02 NOTE — Assessment & Plan Note (Addendum)
Patient reports significant lower back pain, particularly in the mornings when getting out of bed. On exam today his lower back is tender on palpation.   -Counseled patient that narcotic is not the primary treatment for his lower back pain.  -Encouraged patient to continue OTC Tylenol, gabapentin, gel, patches, and heating pad.  -Increased Cymbalta from 30 mg to 60 mg daily -Follow-up in 4 weeks to re-evaluate

## 2022-03-02 NOTE — Assessment & Plan Note (Signed)
BP controlled at 117/73 today.  -Continue amlodipine 10 mg, atenolol 50 mg, HCTZ 25 mg, and losartan 100 mg tablet daily

## 2022-03-02 NOTE — Assessment & Plan Note (Addendum)
Patient with 8/10 constant and sharp pain in his right elbow for 6 days with some pain in his lateral forearm as well. Patient has not noticed erythema, warmth, or swelling in his elbow. He did not sustain a specific injury to his elbow. He reports that he lifts his grandchildren occasionally.   Exam today shows right elbow tenderness to palpation in lateral epicondyle region with some tenderness in lateral forearm. No erythema, warmth, or swelling in elbow. With elbow extended, there is pain with wrist extension and arm supination against resistance.  Assessment: Patient's history of lifting and physical exam findings are consistent with lateral epicondylitis.   Plan -Patient counseled on apply ice and brace to R elbow -Advised patient to take ibuprofen OTC as needed -Provided patient with reference on lateral epicondyle that includes guidance on recovery

## 2022-03-02 NOTE — Progress Notes (Addendum)
Subjective:   Patient ID: SHOLOM DULUDE male   DOB: 08/07/1955 66 y.o.   MRN: [id number]  HPI: [name] is a 66 y.o. man with medical history of chronic back pain who presents to [facility] for evaluation of right elbow pain. Please see problem-based assessment and plan charting for further details.  Review of Systems: Pertinent items are noted in HPI of problem-based charting.  Past Medical History:  Diagnosis Date   Anxiety    Calculus, kidney 2000   Sees Dr. Isabel Caprice, urology.    Cervical spondylosis 2004   sp surgery by Dr. Danielle Dess   Depression    GERD (gastroesophageal reflux disease)    Headache(784.0)    Chronic, on vicodin 5 TID for this.    Hepatitis C    Has occasional visits with Dr. Randa Evens GI, failed RX in 2000.  Liver Biopsy 9/06: Minimally active hepatitis consistent with hepatitis C, minimal necroinflamtory activity grade 1, no incrased fibrosis stage 0.    Herpes labialis    HERPES LABIALIS 04/04/2006   Qualifier: Diagnosis of  By: Aundria Rud MD, Roseanne Reno     History of kidney stones    Hypertension    Pneumonia    Renal stone    Spondylolisthesis of lumbar region    Thalassemia    HGB 9/09 14.4 wtih MCV 72.6   Viral upper respiratory illness 04/12/2021   Wears glasses     Patient Active Problem List   Diagnosis Date Noted   Lateral epicondylitis of right elbow 03/02/2022   Hyperlipidemia 12/30/2020   Alpha thalassaemia minor 05/25/2020   Healthcare maintenance 05/25/2020   Acute gout due to renal impairment involving toe 05/20/2019   Neuropathy 05/18/2019   Primary localized osteoarthritis of left hip 11/18/2018   Primary osteoarthritis of left hip 11/18/2018   Microcytic anemia 08/13/2018   Disc disease, degenerative, cervical 11/01/2017   Liver fibrosis 04/30/2017   NSAID long-term use 01/09/2016   Chronic back pain secondary to DJD of cervical, thoracic and lumbar spine and disc herniation 01/09/2016   Osteoarthritis of both knees 11/02/2011    DISORDER, DEPRESSIVE NEC 09/25/2006   Chronic hepatitis C without hepatic coma (HCC) 02/22/2006   Essential hypertension 02/22/2006     Current Outpatient Medications  Medication Sig Dispense Refill   allopurinol (ZYLOPRIM) 100 MG tablet TAKE 1/2 TABLET BY MOUTH EVERY DAY 45 tablet 1   amLODipine (NORVASC) 10 MG tablet Take 1 tablet (10 mg total) by mouth daily. 90 tablet 3   APPLE CIDER VINEGAR PO Take 480 mg by mouth daily.     atenolol (TENORMIN) 50 MG tablet TAKE 1 TABLET BY MOUTH EVERY DAY 90 tablet 2   atorvastatin (LIPITOR) 10 MG tablet Take 1 tablet (10 mg total) by mouth daily. 90 tablet 3   b complex vitamins tablet Take 1 tablet by mouth daily.     DULoxetine (CYMBALTA) 60 MG capsule TAKE 1 CAPSULE BY MOUTH EVERY DAY 30 capsule 1   ferrous sulfate 325 (65 FE) MG tablet Take 325 mg by mouth daily with breakfast.     gabapentin (NEURONTIN) 300 MG capsule Take 2 capsules (600 mg total) by mouth 3 (three) times daily. 90 capsule 3   Garlic 2000 MG TBEC Take 2,000 mg by mouth daily.     Honey 5 GM/5ML SYRP Take by mouth.     hydrochlorothiazide (HYDRODIURIL) 25 MG tablet Take 1 tablet (25 mg total) by mouth daily. 90 tablet 1  losartan (COZAAR) 100 MG tablet Take 1 tablet (100 mg total) by mouth daily. 90 tablet 3   Multiple Vitamin (MULTIVITAMIN WITH MINERALS) TABS tablet Take 1 tablet by mouth daily.     pantoprazole (PROTONIX) 40 MG tablet Take 40 mg by mouth daily.     Tamsulosin HCl (FLOMAX) 0.4 MG CAPS Take 0.8 mg by mouth daily.      tiZANidine (ZANAFLEX) 4 MG capsule Take 4 mg by mouth 3 (three) times daily as needed.     No current facility-administered medications for this visit.     Objective:   Physical Exam: Vitals:   03/02/22 1057  BP: 117/73  Pulse: 97  Temp: 98.1 F (36.7 C)  TempSrc: Oral  SpO2: 98%  Weight: 203 lb 14.4 oz (92.5 kg)  Height: 5\' 9"  (1.753 m)    Constitutional: in no acute distress Cardiovascular: regular rate with normal rhythm,  no murmurs. Pulmonary/Chest: normal work of breathing on room air, lungs clear to auscultation bilaterally Abdominal: soft, non-tender MSK: Right elbow tenderness to palpation in lateral epicondyle region with some tenderness in lateral forearm. No erythema, warmth, or swelling in elbow. With elbow extended, pain with wrist extension and supination against resistance. Lower back tender on palpation. Skin: warm and dry. Neurological: alert and answering questions appropriately. Psych: appropriate mood and affect   Assessment & Plan:   Lateral epicondylitis of right elbow Patient with 8/10 constant and sharp pain in his right elbow for 6 days with some pain in his lateral forearm as well. Patient has not noticed erythema, warmth, or swelling in his elbow. He did not sustain a specific injury to his elbow. He reports that he lifts his grandchildren occasionally.   Exam today shows right elbow tenderness to palpation in lateral epicondyle region with some tenderness in lateral forearm. No erythema, warmth, or swelling in elbow. With elbow extended, there is pain with wrist extension and arm supination against resistance.  Assessment: Patient's history of lifting and physical exam findings are consistent with lateral epicondylitis.   Plan -Patient counseled on apply ice and brace to R elbow -Advised patient to take ibuprofen OTC as needed -Provided patient with reference on lateral epicondyle that includes guidance on recovery  Chronic back pain secondary to DJD of cervical, thoracic and lumbar spine and disc herniation Patient reports significant lower back pain, particularly in the mornings when getting out of bed. On exam today his lower back is tender on palpation.   -Counseled patient that narcotic is not the primary treatment for his lower back pain.  -Encouraged patient to continue OTC Tylenol, gabapentin, gel, patches, and heating pad.  -Increased Cymbalta from 30 mg to 60 mg  daily -Follow-up in 4 weeks to re-evaluate    Essential hypertension BP controlled at 117/73 today.  -Continue amlodipine 10 mg, atenolol 50 mg, HCTZ 25 mg, and losartan 100 mg tablet daily  Patient discussed with Dr. (Max) Niklas Chretien [employer], MS3 03/02/2022, 3:38 PM    Attestation for Student Documentation:  I personally was present and performed or re-performed the history, physical exam and medical decision-making activities of this service and have verified that the service and findings are accurately documented in the student's note.  03/04/2022, MD 03/02/2022, 3:38 PM

## 2022-03-05 NOTE — Progress Notes (Signed)
Internal Medicine Clinic Attending  Case discussed with Dr. Jinwala  At the time of the visit.  We reviewed the resident's history and exam and pertinent patient test results.  I agree with the assessment, diagnosis, and plan of care documented in the resident's note.  

## 2022-04-03 ENCOUNTER — Other Ambulatory Visit: Payer: Self-pay | Admitting: Student

## 2022-07-19 ENCOUNTER — Other Ambulatory Visit: Payer: Self-pay | Admitting: Internal Medicine

## 2022-07-19 DIAGNOSIS — I10 Essential (primary) hypertension: Secondary | ICD-10-CM

## 2022-07-25 ENCOUNTER — Ambulatory Visit (INDEPENDENT_AMBULATORY_CARE_PROVIDER_SITE_OTHER): Payer: Federal, State, Local not specified - PPO | Admitting: Primary Care

## 2022-07-25 VITALS — BP 98/63 | HR 78 | Temp 98.2°F | Resp 22 | Ht 66.25 in | Wt 202.0 lb

## 2022-07-25 DIAGNOSIS — M170 Bilateral primary osteoarthritis of knee: Secondary | ICD-10-CM | POA: Diagnosis not present

## 2022-07-25 DIAGNOSIS — G8929 Other chronic pain: Secondary | ICD-10-CM

## 2022-07-25 DIAGNOSIS — E669 Obesity, unspecified: Secondary | ICD-10-CM

## 2022-07-25 DIAGNOSIS — Z6832 Body mass index (BMI) 32.0-32.9, adult: Secondary | ICD-10-CM

## 2022-07-25 DIAGNOSIS — Z1211 Encounter for screening for malignant neoplasm of colon: Secondary | ICD-10-CM

## 2022-07-25 DIAGNOSIS — M545 Low back pain, unspecified: Secondary | ICD-10-CM

## 2022-07-25 DIAGNOSIS — Z7689 Persons encountering health services in other specified circumstances: Secondary | ICD-10-CM

## 2022-07-25 DIAGNOSIS — M2550 Pain in unspecified joint: Secondary | ICD-10-CM

## 2022-07-25 MED ORDER — IBUPROFEN 800 MG PO TABS
800.0000 mg | ORAL_TABLET | Freq: Three times a day (TID) | ORAL | 0 refills | Status: DC | PRN
Start: 2022-07-25 — End: 2022-08-06

## 2022-07-25 NOTE — Progress Notes (Signed)
Discuss pain concerns. Pain in back from previous surgery.  Constant 10/10  Does not have pain specialist at this time   Cuts on fingers  HTN

## 2022-07-25 NOTE — Progress Notes (Signed)
New Patient Office Visit  Subjective    Patient ID: Greg Flowers, male    DOB: 01/13/1956  Age: 67 y.o. MRN: [id number]  CC:  Chief Complaint  Patient presents with   Back Pain   Establish Care   Hypertension    HPI Greg Flowers  is a 67 year old male presents to establish care. 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 Chronic back pain surgical intervention 2017 use  a cane for gait stability. Pain is 10/10 to include cervical surgery , Total left hip aches when weather changes.  Patient has No headache, No chest pain, No abdominal pain - No Nausea, No new weakness tingling or numbness, No Cough - shortness of breath   Outpatient Encounter Medications as of 07/25/2022  Medication Sig   acetaminophen (TYLENOL) 650 MG CR tablet Take 650 mg by mouth every 8 (eight) hours as needed for pain.   allopurinol (ZYLOPRIM) 100 MG tablet TAKE 1/2 TABLET BY MOUTH EVERY DAY   amLODipine (NORVASC) 10 MG tablet Take 1 tablet (10 mg total) by mouth daily.   atenolol (TENORMIN) 50 MG tablet TAKE 1 TABLET BY MOUTH EVERY DAY   atorvastatin (LIPITOR) 10 MG tablet Take 1 tablet (10 mg total) by mouth daily.   DULoxetine (CYMBALTA) 60 MG capsule TAKE 1 CAPSULE BY MOUTH EVERY DAY   hydrochlorothiazide (HYDRODIURIL) 25 MG tablet Take 1 tablet (25 mg total) by mouth daily.   losartan (COZAAR) 100 MG tablet Take 1 tablet (100 mg total) by mouth daily.   pantoprazole (PROTONIX) 40 MG tablet Take 40 mg by mouth daily.   promethazine (PHENERGAN) 12.5 MG suppository Place 12.5 mg rectally every 8 (eight) hours as needed for nausea or vomiting.   Tamsulosin HCl (FLOMAX) 0.4 MG CAPS Take 0.8 mg by mouth daily.    APPLE CIDER VINEGAR PO Take 480 mg by mouth daily. (Patient not taking: Reported on 07/25/2022)   b complex vitamins tablet Take 1 tablet by mouth daily. (Patient not taking: Reported on 07/25/2022)   ferrous sulfate 325 (65 FE) MG tablet Take  325 mg by mouth daily with breakfast. (Patient not taking: Reported on 07/25/2022)   gabapentin (NEURONTIN) 300 MG capsule Take 2 capsules (600 mg total) by mouth 3 (three) times daily. (Patient not taking: Reported on 07/25/2022)   Garlic 2000 MG TBEC Take 2,000 mg by mouth daily. (Patient not taking: Reported on 07/25/2022)   Honey 5 GM/5ML SYRP Take by mouth. (Patient not taking: Reported on 07/25/2022)   Multiple Vitamin (MULTIVITAMIN WITH MINERALS) TABS tablet Take 1 tablet by mouth daily. (Patient not taking: Reported on 07/25/2022)   tiZANidine (ZANAFLEX) 4 MG capsule Take 4 mg by mouth 3 (three) times daily as needed.   No facility-administered encounter medications on file as of 07/25/2022.    Past Medical History:  Diagnosis Date   Anxiety    Calculus, kidney 2000   Sees Dr. Isabel Caprice, urology.    Cervical spondylosis 2004   sp surgery by Dr. Danielle Dess   Depression    GERD (gastroesophageal reflux disease)    Headache(784.0)    Chronic, on vicodin 5 TID for this.    Hepatitis C    Has occasional visits with Dr. Randa Evens GI, failed RX in 2000.  Liver Biopsy 9/06: Minimally active hepatitis consistent with hepatitis C, minimal necroinflamtory activity grade 1, no incrased fibrosis stage 0.    Herpes labialis  HERPES LABIALIS 04/04/2006   Qualifier: Diagnosis of  By: Aundria Rudogers MD, Roseanne RenoStewart     History of kidney stones    Hypertension    Pneumonia    Renal stone    Spondylolisthesis of lumbar region    Thalassemia    HGB 9/09 14.4 wtih MCV 72.6   Viral upper respiratory illness 04/12/2021   Wears glasses     Past Surgical History:  Procedure Laterality Date   ANTERIOR CERVICAL DECOMP/DISCECTOMY FUSION N/A 11/21/2017   Procedure: ANTERIOR CERVICAL DECOMPRESSION FUSION, CERVICAL THREE-FOUR, CERVICAL FOUR-FIVE WITH INSTRUMENTATION AND ALLOGRAFT;  Surgeon: Estill Bambergumonski, Mark, MD;  Location: [facility] OR;  Service: Orthopedics;  Laterality: N/A;  ANTERIOR CERVICAL DECOMPRESSION FUSION, CERVICAL  THREE-FOUR, CERVICAL FOUR-FIVE WITH INSTRUMENTATION AND ALLOGRAFT   BACK SURGERY  2017   L4-L5 PLATE/SCREWS   CERVICAL DISCECTOMY  2004   Dr Danielle DessElsner   COLONOSCOPY     LITHOTRIPSY  2004   Dr Logan BoresEvans   NASAL SEPTUM SURGERY  2007   TONSILLECTOMY     TOTAL HIP ARTHROPLASTY Left 11/18/2018   Procedure: Left Anterior Hip Arthroplasty;  Surgeon: Marcene Corningalldorf, Peter, MD;  Location: [facility] OR[facility];  Service: Orthopedics;  Laterality: Left;    Family History  Problem Relation Age of Onset   Hypertension Mother    Bone cancer Mother    Hypertension Father    Heart disease Father    Heart attack Father     Social History   Socioeconomic History   Marital status: Married    Spouse name: Not on file   Number of children: Not on file   Years of education: Not on file   Highest education level: Not on file  Occupational History   Not on file  Tobacco Use   Smoking status: Former    Types: Cigarettes    Quit date: 04/16/1984    Years since quitting: 38.2   Smokeless tobacco: Never   Tobacco comments:    quit in the early 1980's  Vaping Use   Vaping Use: Never used  Substance and Sexual Activity   Alcohol use: Yes    Comment: Occasionally 2-3 glasses wine/week   Drug use: No   Sexual activity: Yes    Partners: Female  Other Topics Concern   Not on file  Social History Narrative   Works at post office, Married, Exercises.    Social Determinants of Health   Financial Resource Strain: Not on file  Food Insecurity: No Food Insecurity (03/02/2022)   Hunger Vital Sign    Worried About Running Out of Food in the Last Year: Never true    Ran Out of Food in the Last Year: Never true  Transportation Needs: No Transportation Needs (03/02/2022)   PRAPARE - Administrator, Civil ServiceTransportation    Lack of Transportation (Medical): No    Lack of Transportation (Non-Medical): No  Physical Activity: Not on file  Stress: Not on file  Social Connections: Socially Integrated (03/02/2022)   Social Connection and Isolation Panel  [NHANES]    Frequency of Communication with Friends and Family: More than three times a week    Frequency of Social Gatherings with Friends and Family: More than three times a week    Attends Religious Services: More than 4 times per year    Active Member of [employer] Financial[employer]ubs or Organizations: Yes    Attends BankerClub or Organization Meetings: More than 4 times per year    Marital Status: Married  Catering managerntimate Partner Violence: Not At Risk (03/02/2022)   Humiliation, Afraid, Rape, and  Kick questionnaire    Fear of Current or Ex-Partner: No    Emotionally Abused: No    Physically Abused: No    Sexually Abused: No    ROS Comprehensive ROS Pertinent positive and negative noted in HPI       Objective    Blood Pressure 98/63   Pulse 78   Temperature 98.2 F (36.8 C) (Oral)   Respiration (Abnormal) 22   Height 5' 6.25" (1.683 m)   Weight 202 lb (91.6 kg)   Oxygen Saturation 98%   Body Mass Index 32.36 kg/m   Physical Exam Vitals reviewed.  Constitutional:      Appearance: He is obese.  HENT:     Head: Normocephalic.     Right Ear: Tympanic membrane and external ear normal.     Left Ear: Tympanic membrane and external ear normal.     Nose: Nose normal.  Eyes:     Extraocular Movements: Extraocular movements intact.     Pupils: Pupils are equal, round, and reactive to light.  Cardiovascular:     Rate and Rhythm: Normal rate and regular rhythm.  Pulmonary:     Effort: Pulmonary effort is normal.     Breath sounds: Normal breath sounds.  Abdominal:     General: Bowel sounds are normal. There is distension.     Palpations: Abdomen is soft.  Musculoskeletal:        General: Normal range of motion.     Cervical back: Normal range of motion and neck supple.  Skin:    General: Skin is warm and dry.  Neurological:     Mental Status: He is oriented to person, place, and time.  Psychiatric:        Mood and Affect: Mood normal.        Behavior: Behavior normal.     Assessment & Plan:   Taim was seen today for back pain, establish care and hypertension.  Diagnoses and all orders for this visit: Greg Flowers was seen today for back pain, establish care and hypertension.  Diagnoses and all orders for this visit:  Encounter to establish care    Arthralgia, unspecified joint 2/2 Osteoarthritis of both knees, unspecified osteoarthritis type -     PT eval and treat; Future -     Ambulatory referral to Pain Clinic -     ibuprofen (ADVIL) 800 MG tablet; Take 1 tablet (800 mg total) by mouth every 8 (eight) hours as needed for moderate pain.  Chronic bilateral low back pain, unspecified whether sciatica present -     PT eval and treat; Future -     Ambulatory referral to Pain Clinic -     ibuprofen (ADVIL) 800 MG tablet; Take 1 tablet (800 mg total) by mouth every 8 (eight) hours as needed for moderate pain.   Colon cancer screening -     Ambulatory referral to Gastroenterology    Grayce Sessions, NP

## 2022-07-26 ENCOUNTER — Other Ambulatory Visit: Payer: Self-pay | Admitting: Internal Medicine

## 2022-07-26 ENCOUNTER — Other Ambulatory Visit (INDEPENDENT_AMBULATORY_CARE_PROVIDER_SITE_OTHER): Payer: Federal, State, Local not specified - PPO

## 2022-07-26 DIAGNOSIS — I10 Essential (primary) hypertension: Secondary | ICD-10-CM

## 2022-07-26 DIAGNOSIS — M10379 Gout due to renal impairment, unspecified ankle and foot: Secondary | ICD-10-CM

## 2022-07-26 DIAGNOSIS — E782 Mixed hyperlipidemia: Secondary | ICD-10-CM

## 2022-07-26 DIAGNOSIS — Z0000 Encounter for general adult medical examination without abnormal findings: Secondary | ICD-10-CM

## 2022-07-26 DIAGNOSIS — D509 Iron deficiency anemia, unspecified: Secondary | ICD-10-CM

## 2022-07-27 LAB — CBC WITH DIFFERENTIAL/PLATELET
Basophils Absolute: 0.1 10*3/uL (ref 0.0–0.2)
Basos: 1 %
EOS (ABSOLUTE): 0.6 10*3/uL — ABNORMAL HIGH (ref 0.0–0.4)
Eos: 7 %
Hematocrit: 36.8 % — ABNORMAL LOW (ref 37.5–51.0)
Hemoglobin: 11.9 g/dL — ABNORMAL LOW (ref 13.0–17.7)
Immature Grans (Abs): 0 10*3/uL (ref 0.0–0.1)
Immature Granulocytes: 0 %
Lymphocytes Absolute: 2 10*3/uL (ref 0.7–3.1)
Lymphs: 25 %
MCH: 22.7 pg — ABNORMAL LOW (ref 26.6–33.0)
MCHC: 32.3 g/dL (ref 31.5–35.7)
MCV: 70 fL — ABNORMAL LOW (ref 79–97)
Monocytes Absolute: 0.7 10*3/uL (ref 0.1–0.9)
Monocytes: 9 %
Neutrophils Absolute: 4.8 10*3/uL (ref 1.4–7.0)
Neutrophils: 58 %
Platelets: 272 10*3/uL (ref 150–450)
RBC: 5.24 x10E6/uL (ref 4.14–5.80)
RDW: 14.6 % (ref 11.6–15.4)
WBC: 8.1 10*3/uL (ref 3.4–10.8)

## 2022-07-27 LAB — LIPID PANEL
Chol/HDL Ratio: 3.1 ratio (ref 0.0–5.0)
Cholesterol, Total: 127 mg/dL (ref 100–199)
HDL: 41 mg/dL (ref >39)
LDL Chol Calc (NIH): 73 mg/dL (ref 0–99)
Triglycerides: 60 mg/dL (ref 0–149)
VLDL Cholesterol Cal: 13 mg/dL (ref 5–40)

## 2022-07-27 LAB — CMP14+EGFR
ALT: 16 IU/L (ref 0–44)
AST: 19 IU/L (ref 0–40)
Albumin/Globulin Ratio: 1.9 (ref 1.2–2.2)
Albumin: 4.4 g/dL (ref 3.9–4.9)
Alkaline Phosphatase: 66 IU/L (ref 44–121)
BUN/Creatinine Ratio: 13 (ref 10–24)
BUN: 14 mg/dL (ref 8–27)
Bilirubin Total: 0.4 mg/dL (ref 0.0–1.2)
CO2: 26 mmol/L (ref 20–29)
Calcium: 9.8 mg/dL (ref 8.6–10.2)
Chloride: 94 mmol/L — ABNORMAL LOW (ref 96–106)
Creatinine, Ser: 1.1 mg/dL (ref 0.76–1.27)
Globulin, Total: 2.3 g/dL (ref 1.5–4.5)
Glucose: 101 mg/dL — ABNORMAL HIGH (ref 70–99)
Potassium: 3.7 mmol/L (ref 3.5–5.2)
Sodium: 136 mmol/L (ref 134–144)
Total Protein: 6.7 g/dL (ref 6.0–8.5)
eGFR: 73 mL/min/{1.73_m2} (ref >59)

## 2022-07-27 LAB — URIC ACID: Uric Acid: 7.6 mg/dL (ref 3.8–8.4)

## 2022-07-27 LAB — PSA: Prostate Specific Ag, Serum: 0.1 ng/mL (ref 0.0–4.0)

## 2022-07-29 ENCOUNTER — Encounter (INDEPENDENT_AMBULATORY_CARE_PROVIDER_SITE_OTHER): Payer: Self-pay | Admitting: Primary Care

## 2022-08-06 ENCOUNTER — Other Ambulatory Visit: Payer: Self-pay | Admitting: Internal Medicine

## 2022-08-06 ENCOUNTER — Other Ambulatory Visit (INDEPENDENT_AMBULATORY_CARE_PROVIDER_SITE_OTHER): Payer: Self-pay | Admitting: Primary Care

## 2022-08-06 DIAGNOSIS — M2550 Pain in unspecified joint: Secondary | ICD-10-CM

## 2022-08-06 DIAGNOSIS — M545 Low back pain, unspecified: Secondary | ICD-10-CM

## 2022-08-06 DIAGNOSIS — M170 Bilateral primary osteoarthritis of knee: Secondary | ICD-10-CM

## 2022-08-06 DIAGNOSIS — M10379 Gout due to renal impairment, unspecified ankle and foot: Secondary | ICD-10-CM

## 2022-08-06 MED ORDER — HYDROCHLOROTHIAZIDE 25 MG PO TABS: 25.0000 mg | ORAL_TABLET | Freq: Every day | ORAL | 1 refills | Status: DC

## 2022-08-06 MED ORDER — ALLOPURINOL 100 MG PO TABS: 50.0000 mg | ORAL_TABLET | Freq: Every day | ORAL | 1 refills | Status: DC

## 2022-08-12 ENCOUNTER — Other Ambulatory Visit: Payer: Self-pay | Admitting: Internal Medicine

## 2022-08-12 DIAGNOSIS — I10 Essential (primary) hypertension: Secondary | ICD-10-CM

## 2022-08-14 ENCOUNTER — Telehealth: Payer: Self-pay | Admitting: Primary Care

## 2022-08-14 NOTE — Telephone Encounter (Signed)
Copied from CRM [id number]. Topic: General - Other >> Aug 14, 2022 10:00 AM Macon Large wrote: Reason for CRM: Pt requests call back because he has an appt with [facility] on May 16 and would like more information. Pt also would like to discuss appt for water aerobic evaluation as well as his lab results. Cb# [phone]

## 2022-08-15 NOTE — Telephone Encounter (Signed)
Reached out to pt in regards to his request.  Pt wanted provider to know about his appointment with [facility] Pt had questions in regards to the stool sample.. Made pt aware that provider placed a referral and they tried contacting him. Pt states he was told by his GI that he doesn't need another colonoscopy till 2025. Made pt aware that he wouldn't need to do the stool sample because it is consider a colon cancer screening test. Pt states he understands. Then pt asked bout the pneumo vaccine made pt aware that he can get vaccine at next appt. Pt then asked when that was.. Made pt aware that a follow up appt should've been made when he was discharged pt states he didn't stop at the checkout. Was making pt an follow up appt pt then asked about the water aerobics. Made pt aware that he can go to the [facility] in regards to that pt states he would rather speak to the provider. Made pt aware that provider is out of the office this week. Pt states he will come by the office next week to speak to provider

## 2022-08-24 ENCOUNTER — Encounter (INDEPENDENT_AMBULATORY_CARE_PROVIDER_SITE_OTHER): Payer: Self-pay | Admitting: Primary Care

## 2022-08-24 DIAGNOSIS — G8929 Other chronic pain: Secondary | ICD-10-CM

## 2022-08-28 ENCOUNTER — Ambulatory Visit (INDEPENDENT_AMBULATORY_CARE_PROVIDER_SITE_OTHER): Payer: Federal, State, Local not specified - PPO | Admitting: Primary Care

## 2022-09-03 ENCOUNTER — Encounter (INDEPENDENT_AMBULATORY_CARE_PROVIDER_SITE_OTHER): Payer: Self-pay | Admitting: Primary Care

## 2022-09-07 ENCOUNTER — Other Ambulatory Visit: Payer: Self-pay | Admitting: Physical Medicine & Rehabilitation

## 2022-09-07 ENCOUNTER — Ambulatory Visit
Admission: RE | Admit: 2022-09-07 | Discharge: 2022-09-07 | Disposition: A | Discharge status: Home / Self Care | Payer: Federal, State, Local not specified - PPO | Source: Ambulatory Visit | Attending: Physical Medicine & Rehabilitation | Admitting: Physical Medicine & Rehabilitation

## 2022-09-07 DIAGNOSIS — M5451 Vertebrogenic low back pain: Secondary | ICD-10-CM

## 2022-09-07 DIAGNOSIS — M25552 Pain in left hip: Secondary | ICD-10-CM

## 2022-09-24 ENCOUNTER — Ambulatory Visit (INDEPENDENT_AMBULATORY_CARE_PROVIDER_SITE_OTHER): Payer: Federal, State, Local not specified - PPO

## 2022-09-24 ENCOUNTER — Ambulatory Visit (HOSPITAL_COMMUNITY)
Admission: EM | Admit: 2022-09-24 | Discharge: 2022-09-24 | Disposition: A | Discharge status: Home / Self Care | Payer: Federal, State, Local not specified - PPO | Attending: Physician Assistant | Admitting: Physician Assistant

## 2022-09-24 ENCOUNTER — Encounter (HOSPITAL_COMMUNITY): Payer: Self-pay | Admitting: *Deleted

## 2022-09-24 ENCOUNTER — Other Ambulatory Visit: Payer: Self-pay

## 2022-09-24 DIAGNOSIS — R051 Acute cough: Secondary | ICD-10-CM | POA: Diagnosis not present

## 2022-09-24 DIAGNOSIS — J40 Bronchitis, not specified as acute or chronic: Secondary | ICD-10-CM | POA: Diagnosis not present

## 2022-09-24 DIAGNOSIS — J329 Chronic sinusitis, unspecified: Secondary | ICD-10-CM

## 2022-09-24 MED ORDER — IPRATROPIUM-ALBUTEROL 0.5-2.5 (3) MG/3ML IN SOLN
3.0000 mL | Freq: Once | RESPIRATORY_TRACT | Status: AC
  Administered 2022-09-24: 3 mL via RESPIRATORY_TRACT

## 2022-09-24 MED ORDER — BENZONATATE 100 MG PO CAPS: 100.0000 mg | ORAL_CAPSULE | Freq: Three times a day (TID) | ORAL | 0 refills | Status: DC

## 2022-09-24 MED ORDER — ALBUTEROL SULFATE HFA 108 (90 BASE) MCG/ACT IN AERS: 2.0000 | INHALATION_SPRAY | Freq: Four times a day (QID) | RESPIRATORY_TRACT | 0 refills | Status: DC | PRN

## 2022-09-24 MED ORDER — PREDNISONE 20 MG PO TABS: 40.0000 mg | ORAL_TABLET | Freq: Every day | ORAL | 0 refills | Status: AC

## 2022-09-24 MED ORDER — AMOXICILLIN-POT CLAVULANATE 875-125 MG PO TABS: 1.0000 | ORAL_TABLET | Freq: Two times a day (BID) | ORAL | 0 refills | Status: DC

## 2022-09-24 MED ORDER — IPRATROPIUM-ALBUTEROL 0.5-2.5 (3) MG/3ML IN SOLN
RESPIRATORY_TRACT | Status: AC
  Filled 2022-09-24: qty 3

## 2022-09-24 NOTE — ED Provider Notes (Signed)
[facility]    CSN: [id number] Arrival date & time: 09/24/22  1758      History   Chief Complaint Chief Complaint  Patient presents with   Cough   Nasal Congestion    HPI MIKA RAJARAM is a 67 y.o. male.   Patient presents today with a 1+ week history of cough and congestion.  Reports that he was taking care of his grandchildren who had a runny nose but denies any additional known sick contacts.  He has never had COVID in the past.  He has tried multiple over-the-counter medications including Robitussin.  Denies any history of asthma, COPD.  He is a former smoker quit many years ago.  He does report associated cough, congestion, chest tightness, wheezing.  Denies any fever, chest pain, nausea, vomiting.  Denies any recent antibiotics or steroids.    Past Medical History:  Diagnosis Date   Anxiety    Calculus, kidney 2000   Sees Dr. Isabel Caprice, urology.    Cervical spondylosis 2004   sp surgery by Dr. Danielle Dess   Depression    GERD (gastroesophageal reflux disease)    Headache(784.0)    Chronic, on vicodin 5 TID for this.    Hepatitis C    Has occasional visits with Dr. Randa Evens GI, failed RX in 2000.  Liver Biopsy 9/06: Minimally active hepatitis consistent with hepatitis C, minimal necroinflamtory activity grade 1, no incrased fibrosis stage 0.    Herpes labialis    HERPES LABIALIS 04/04/2006   Qualifier: Diagnosis of  By: Aundria Rud MD, Roseanne Reno     History of kidney stones    Hypertension    Pneumonia    Renal stone    Spondylolisthesis of lumbar region    Thalassemia    HGB 9/09 14.4 wtih MCV 72.6   Viral upper respiratory illness 04/12/2021   Wears glasses     Patient Active Problem List   Diagnosis Date Noted   Lateral epicondylitis of right elbow 03/02/2022   Hyperlipidemia 12/30/2020   Alpha thalassaemia minor 05/25/2020   Healthcare maintenance 05/25/2020   Acute gout due to renal impairment involving toe 05/20/2019   Neuropathy 05/18/2019   Primary  localized osteoarthritis of left hip 11/18/2018   Primary osteoarthritis of left hip 11/18/2018   Microcytic anemia 08/13/2018   Disc disease, degenerative, cervical 11/01/2017   Liver fibrosis 04/30/2017   NSAID long-term use 01/09/2016   Chronic back pain secondary to DJD of cervical, thoracic and lumbar spine and disc herniation 01/09/2016   Osteoarthritis of both knees 11/02/2011   DISORDER, DEPRESSIVE NEC 09/25/2006   Chronic hepatitis C without hepatic coma (HCC) 02/22/2006   Essential hypertension 02/22/2006    Past Surgical History:  Procedure Laterality Date   ANTERIOR CERVICAL DECOMP/DISCECTOMY FUSION N/A 11/21/2017   Procedure: ANTERIOR CERVICAL DECOMPRESSION FUSION, CERVICAL THREE-FOUR, CERVICAL FOUR-FIVE WITH INSTRUMENTATION AND ALLOGRAFT;  Surgeon: Estill Bamberg, MD;  Location: [facility];  Service: Orthopedics;  Laterality: N/A;  ANTERIOR CERVICAL DECOMPRESSION FUSION, CERVICAL THREE-FOUR, CERVICAL FOUR-FIVE WITH INSTRUMENTATION AND ALLOGRAFT   BACK SURGERY  2017   L4-L5 PLATE/SCREWS   CERVICAL DISCECTOMY  2004   Dr Danielle Dess   COLONOSCOPY     LITHOTRIPSY  2004   Dr Logan Bores   NASAL SEPTUM SURGERY  2007   TONSILLECTOMY     TOTAL HIP ARTHROPLASTY Left 11/18/2018   Procedure: Left Anterior Hip Arthroplasty;  Surgeon: Marcene Corning, MD;  Location: [facility];  Service: Orthopedics;  Laterality: Left;  Home Medications    Prior to Admission medications   Medication Sig Start Date End Date Taking? Authorizing Provider  acetaminophen (TYLENOL) 650 MG CR tablet Take 650 mg by mouth every 8 (eight) hours as needed for pain.   Yes [provider]  albuterol (VENTOLIN HFA) 108 (90 Base) MCG/ACT inhaler Inhale 2 puffs into the lungs every 6 (six) hours as needed for wheezing or shortness of breath. 09/24/22  Yes Anwitha Mapes, Noberto Retort, PA-C  allopurinol (ZYLOPRIM) 100 MG tablet Take 0.5 tablets (50 mg total) by mouth daily. 08/06/22  Yes Mercie Eon, MD  amLODipine (NORVASC) 10 MG  tablet Take 1 tablet (10 mg total) by mouth daily. 02/08/22  Yes Adron Bene, MD  amoxicillin-clavulanate (AUGMENTIN) 875-125 MG tablet Take 1 tablet by mouth every 12 (twelve) hours. 09/24/22  Yes Nathanial Arrighi K, PA-C  atenolol (TENORMIN) 50 MG tablet TAKE 1 TABLET BY MOUTH EVERY DAY 02/08/22  Yes Adron Bene, MD  atorvastatin (LIPITOR) 10 MG tablet Take 1 tablet (10 mg total) by mouth daily. 02/08/22  Yes Adron Bene, MD  benzonatate (TESSALON) 100 MG capsule Take 1 capsule (100 mg total) by mouth every 8 (eight) hours. 09/24/22  Yes Tighe Gitto K, PA-C  DULoxetine (CYMBALTA) 30 MG capsule TAKE 1 CAPSULE BY MOUTH EVERY DAY 08/06/22  Yes Grayce Sessions, NP  hydrochlorothiazide (HYDRODIURIL) 25 MG tablet Take 1 tablet (25 mg total) by mouth daily. 08/06/22  Yes Mercie Eon, MD  losartan (COZAAR) 100 MG tablet Take 1 tablet (100 mg total) by mouth daily. 02/08/22  Yes Adron Bene, MD  pantoprazole (PROTONIX) 40 MG tablet Take 40 mg by mouth daily.   Yes [provider]  predniSONE (DELTASONE) 20 MG tablet Take 2 tablets (40 mg total) by mouth daily for 4 days. 09/24/22 09/28/22 Yes Nayah Lukens, Noberto Retort, PA-C  promethazine (PHENERGAN) 12.5 MG suppository Place 12.5 mg rectally every 8 (eight) hours as needed for nausea or vomiting.   Yes [provider]  Tamsulosin HCl (FLOMAX) 0.4 MG CAPS Take 0.8 mg by mouth daily.    Yes [provider]    Family History Family History  Problem Relation Age of Onset   Hypertension Mother    Bone cancer Mother    Hypertension Father    Heart disease Father    Heart attack Father     Social History Social History   Tobacco Use   Smoking status: Former    Types: Cigarettes    Quit date: 04/16/1984    Years since quitting: 38.4   Smokeless tobacco: Never   Tobacco comments:    quit in the early 1980's  Vaping Use   Vaping Use: Never used  Substance Use Topics   Alcohol use: Yes    Comment: Occasionally  2-3 glasses wine/week   Drug use: No     Allergies   Patient has no known allergies.   Review of Systems Review of Systems  Constitutional:  Positive for activity change. Negative for appetite change, fatigue and fever.  HENT:  Positive for congestion and sinus pressure. Negative for sneezing and sore throat.   Respiratory:  Positive for cough and shortness of breath.   Cardiovascular:  Negative for chest pain.  Gastrointestinal:  Positive for diarrhea. Negative for abdominal pain, nausea and vomiting.  Neurological:  Negative for dizziness, light-headedness and headaches.     Physical Exam Triage Vital Signs ED Triage Vitals  Enc Vitals Group     BP 09/24/22 1913 136/88  Pulse Rate 09/24/22 1913 76     Resp 09/24/22 1913 18     Temp 09/24/22 1913 98 F (36.7 C)     Temp src --      SpO2 09/24/22 1913 94 %     Weight --      Height --      Head Circumference --      Peak Flow --      Pain Score 09/24/22 1911 0     Pain Loc --      Pain Edu? --      Excl. in GC? --    No data found.  Updated Vital Signs BP 136/88   Pulse 76   Temp 98 F (36.7 C)   Resp 18   SpO2 94%   Visual Acuity Right Eye Distance:   Left Eye Distance:   Bilateral Distance:    Right Eye Near:   Left Eye Near:    Bilateral Near:     Physical Exam Vitals reviewed.  Constitutional:      General: He is awake.     Appearance: Normal appearance. He is well-developed. He is not ill-appearing.     Comments: Very pleasant male appears stated age in no acute distress sitting comfortably in exam room  HENT:     Head: Normocephalic and atraumatic.     Right Ear: Tympanic membrane, ear canal and external ear normal. Tympanic membrane is not erythematous or bulging.     Left Ear: External ear normal. There is impacted cerumen.     Nose:     Right Sinus: Frontal sinus tenderness present. No maxillary sinus tenderness.     Left Sinus: Frontal sinus tenderness present. No maxillary sinus  tenderness.     Mouth/Throat:     Pharynx: Uvula midline. Posterior oropharyngeal erythema present. No oropharyngeal exudate or uvula swelling.     Comments: Erythema and drainage in posterior oropharynx Cardiovascular:     Rate and Rhythm: Normal rate and regular rhythm.     Heart sounds: Normal heart sounds, S1 normal and S2 normal. No murmur heard. Pulmonary:     Effort: Pulmonary effort is normal. No accessory muscle usage or respiratory distress.     Breath sounds: No stridor. Wheezing and rhonchi present. No rales.     Comments: Widespread wheezing and rhonchi Neurological:     Mental Status: He is alert.  Psychiatric:        Behavior: Behavior is cooperative.      UC Treatments / Results  Labs (all labs ordered are listed, but only abnormal results are displayed) Labs Reviewed - No data to display  EKG   Radiology DG Chest 2 View  Result Date: 09/24/2022 CLINICAL DATA:  Cough and wheezing. Congestion. EXAM: CHEST - 2 VIEW COMPARISON:  Radiograph 06/25/2021 FINDINGS: Heart is normal in size. Stable mediastinal contours with aortic tortuosity. The lungs are clear. Pulmonary vasculature is normal. No consolidation, pleural effusion, or pneumothorax. No acute osseous abnormalities are seen. Lower thoracic anterior osteophytes. IMPRESSION: No acute chest findings.  No explanation for cough. Electronically Signed   By: Narda Rutherford M.D.   On: 09/24/2022 20:11    Procedures Procedures (including critical care time)  Medications Ordered in UC Medications  ipratropium-albuterol (DUONEB) 0.5-2.5 (3) MG/3ML nebulizer solution 3 mL (3 mLs Nebulization Given 09/24/22 2016)    Initial Impression / Assessment and Plan / UC Course  I have reviewed the triage vital signs and the nursing notes.  Pertinent labs &  imaging results that were available during my care of the patient were reviewed by me and considered in my medical decision making (see chart for details).     Patient  is well-appearing, afebrile, nontoxic, nontachycardic.  Chest x-ray was obtained which showed no acute cardiopulmonary disease.  Viral testing was deferred as he has already been symptomatic for 1 week and this would not change management.  Given prolonged and worsening symptoms will cover for secondary bacterial infection with Augmentin twice daily for 7 days.  He was given a DuoNeb in clinic with improvement of symptoms.  He was sent home with an albuterol inhaler with instruction to use this every 4-6 hours as needed.  Will start prednisone burst of 40 mg tomorrow (09/25/2022).  Discussed that he should avoid NSAIDs with this medication due to risk of GI bleeding.  Recommend over-the-counter medication including Tylenol, Mucinex, Flonase.  He was prescribed Tessalon for cough.  Recommended follow-up with either our clinic or primary care within a few days.  Discussed that if he has any worsening or changing symptoms including worsening cough, chest pain, shortness of breath, high fever he needs to be seen immediately.  Strict return precautions given.  Final Clinical Impressions(s) / UC Diagnoses   Final diagnoses:  Sinobronchitis  Acute cough     Discharge Instructions      Your x-ray did not show any evidence of pneumonia.  I am concerned that you have an infection given your symptoms have been ongoing and are worsening.  Start Augmentin twice daily for 7 days.  Use albuterol inhaler every 4-6 hours as needed.  Start prednisone 40 mg for 4 days.  Do not take NSAIDs with this medication including aspirin, ibuprofen/Advil, naproxen/Aleve.  You can use Tylenol, Mucinex, Flonase for symptom relief.  Use Tessalon for cough.  Make sure that you rest and drink plenty of fluid.  If your symptoms are improving within a few days please return for reevaluation.  If you have any worsening symptoms including worsening cough, shortness of breath, chest discomfort need to be seen immediately.     ED  Prescriptions     Medication Sig Dispense Auth. Provider   predniSONE (DELTASONE) 20 MG tablet Take 2 tablets (40 mg total) by mouth daily for 4 days. 8 tablet Quianna Avery K, PA-C   albuterol (VENTOLIN HFA) 108 (90 Base) MCG/ACT inhaler Inhale 2 puffs into the lungs every 6 (six) hours as needed for wheezing or shortness of breath. 18 g [place] Gustafson K, PA-C   amoxicillin-clavulanate (AUGMENTIN) 875-125 MG tablet Take 1 tablet by mouth every 12 (twelve) hours. 14 tablet Bereket Gernert K, PA-C   benzonatate (TESSALON) 100 MG capsule Take 1 capsule (100 mg total) by mouth every 8 (eight) hours. 21 capsule [place] Kirt K, PA-C      PDMP not reviewed this encounter.   Jeani Hawking, PA-C 09/24/22 2044

## 2022-09-24 NOTE — Discharge Instructions (Signed)
Your x-ray did not show any evidence of pneumonia.  I am concerned that you have an infection given your symptoms have been ongoing and are worsening.  Start Augmentin twice daily for 7 days.  Use albuterol inhaler every 4-6 hours as needed.  Start prednisone 40 mg for 4 days.  Do not take NSAIDs with this medication including aspirin, ibuprofen/Advil, naproxen/Aleve.  You can use Tylenol, Mucinex, Flonase for symptom relief.  Use Tessalon for cough.  Make sure that you rest and drink plenty of fluid.  If your symptoms are improving within a few days please return for reevaluation.  If you have any worsening symptoms including worsening cough, shortness of breath, chest discomfort need to be seen immediately.

## 2022-09-24 NOTE — ED Triage Notes (Signed)
Pt reports cough and congestion that started last week. Pt has tried OTC with out relief.

## 2022-09-26 ENCOUNTER — Other Ambulatory Visit: Payer: Self-pay | Admitting: Internal Medicine

## 2022-09-26 DIAGNOSIS — I10 Essential (primary) hypertension: Secondary | ICD-10-CM

## 2022-09-28 ENCOUNTER — Encounter (INDEPENDENT_AMBULATORY_CARE_PROVIDER_SITE_OTHER): Payer: Self-pay | Admitting: Primary Care

## 2022-09-28 DIAGNOSIS — I10 Essential (primary) hypertension: Secondary | ICD-10-CM

## 2022-09-30 MED ORDER — AMLODIPINE BESYLATE 10 MG PO TABS: 10.0000 mg | ORAL_TABLET | Freq: Every day | ORAL | 1 refills | Status: DC

## 2022-09-30 MED ORDER — LOSARTAN POTASSIUM 100 MG PO TABS
100.0000 mg | ORAL_TABLET | Freq: Every day | ORAL | 1 refills | Status: DC
Start: 2022-09-30 — End: 2023-06-04

## 2022-10-06 ENCOUNTER — Other Ambulatory Visit (INDEPENDENT_AMBULATORY_CARE_PROVIDER_SITE_OTHER): Payer: Self-pay | Admitting: Primary Care

## 2022-10-06 DIAGNOSIS — M2550 Pain in unspecified joint: Secondary | ICD-10-CM

## 2022-10-06 DIAGNOSIS — M170 Bilateral primary osteoarthritis of knee: Secondary | ICD-10-CM

## 2022-10-06 DIAGNOSIS — M545 Low back pain, unspecified: Secondary | ICD-10-CM

## 2022-10-08 NOTE — Telephone Encounter (Signed)
Unable to refill per protocol, Rx expired. Discontinued 09/24/22.  Requested Prescriptions  Pending Prescriptions Disp Refills   ibuprofen (ADVIL) 800 MG tablet [Pharmacy Med Name: IBUPROFEN 800 MG TABLET] 60 tablet 0    Sig: TAKE 1 TABLET BY MOUTH EVERY 8 HOURS AS NEEDED FOR MODERATE PAIN.     Analgesics:  NSAIDS Failed - 10/06/2022  9:31 AM      Failed - Manual Review: Labs are only required if the patient has taken medication for more than 8 weeks.      Failed - HGB in normal range and within 360 days    Hemoglobin  Date Value Ref Range Status  07/26/2022 11.9 (L) 13.0 - 17.7 g/dL Final         Failed - HCT in normal range and within 360 days    Hematocrit  Date Value Ref Range Status  07/26/2022 36.8 (L) 37.5 - 51.0 % Final         Passed - Cr in normal range and within 360 days    Creatinine  Date Value Ref Range Status  06/17/2019 1.37 (H) 0.61 - 1.24 mg/dL Final   Creat  Date Value Ref Range Status  01/16/2017 1.24 0.70 - 1.25 mg/dL Final    Comment:    For patients >37 years of age, the reference limit for Creatinine is approximately 13% higher for people identified as African-American. .    Creatinine, Ser  Date Value Ref Range Status  07/26/2022 1.10 0.76 - 1.27 mg/dL Final   Creatinine,U  Date Value Ref Range Status  11/23/2013 116.45 mg/dL Final    Comment:       Cutoff Values for Urine Drug Screen, Pain Mgmt          Drug Class           Cutoff (ng/mL)          Amphetamines             500          Barbiturates             200          Cocaine Metabolites      150          Benzodiazepines          200          Methadone                300          Opiates                  300          Phencyclidine             25          Propoxyphene             300          Marijuana Metabolites     50    For medical purposes only.   Creatinine, Urine  Date Value Ref Range Status  08/24/2013 301.55 >20.0 mg/dL Final    Comment:    * (PPS) Presumptive  positive screen result to be verified by         quantitative LC/MS or GC/MS confirmation testing.         Passed - PLT in normal range and within 360 days    Platelets  Date Value Ref Range Status  07/26/2022 272  150 - 450 x10E3/uL Final         Passed - eGFR is 30 or above and within 360 days    GFR, Est African American  Date Value Ref Range Status  10/03/2016 76 >=60 mL/min Final   GFR, Est AFR Am  Date Value Ref Range Status  06/17/2019 >60 >60 mL/min Final   GFR calc Af Amer  Date Value Ref Range Status  05/25/2020 88 >59 mL/min/1.73 Final    Comment:    **In accordance with recommendations from the NKF-ASN Task force,**   [facility] is in the process of updating its eGFR calculation to the   2021 CKD-EPI creatinine equation that estimates kidney function   without a race variable.    GFR, Est Non African American  Date Value Ref Range Status  10/03/2016 66 >=60 mL/min Final   GFR, Estimated  Date Value Ref Range Status  06/25/2021 >60 >60 mL/min Final    Comment:    (NOTE) Calculated using the CKD-EPI Creatinine Equation (2021)   06/17/2019 54 (L) >60 mL/min Final   eGFR  Date Value Ref Range Status  07/26/2022 73 >59 mL/min/1.73 Final         Passed - Patient is not pregnant      Passed - Valid encounter within last 12 months    Recent Outpatient Visits           2 months ago Arthralgia, unspecified joint   [facility] Grayce Sessions, NP

## 2022-10-26 ENCOUNTER — Other Ambulatory Visit (INDEPENDENT_AMBULATORY_CARE_PROVIDER_SITE_OTHER): Payer: Self-pay | Admitting: Primary Care

## 2022-10-26 ENCOUNTER — Other Ambulatory Visit: Payer: Self-pay | Admitting: Primary Care

## 2022-10-26 DIAGNOSIS — G8929 Other chronic pain: Secondary | ICD-10-CM

## 2022-10-26 DIAGNOSIS — M2550 Pain in unspecified joint: Secondary | ICD-10-CM

## 2022-10-26 DIAGNOSIS — M170 Bilateral primary osteoarthritis of knee: Secondary | ICD-10-CM

## 2022-10-29 NOTE — Telephone Encounter (Signed)
Requested medications are due for refill today.  no  Requested medications are on the active medications list.  no  Last refill.   Future visit scheduled.   no  Notes to clinic.  Both rx's signed by other providers. Pt now takes 30 mg duloxetine, and no longer takes gabapentin.     Requested Prescriptions  Pending Prescriptions Disp Refills   gabapentin (NEURONTIN) 300 MG capsule [Pharmacy Med Name: GABAPENTIN 300 MG CAPSULE] 90 capsule 3    Sig: TAKE 2 CAPSULES BY MOUTH 3 TIMES DAILY.     There is no refill protocol information for this order     DULoxetine (CYMBALTA) 60 MG capsule [Pharmacy Med Name: DULOXETINE HCL DR 60 MG CAP] 90 capsule 1    Sig: TAKE 1 CAPSULE BY MOUTH EVERY DAY     There is no refill protocol information for this order

## 2022-10-30 ENCOUNTER — Other Ambulatory Visit (INDEPENDENT_AMBULATORY_CARE_PROVIDER_SITE_OTHER): Payer: Self-pay | Admitting: Primary Care

## 2022-10-30 DIAGNOSIS — M2550 Pain in unspecified joint: Secondary | ICD-10-CM

## 2022-10-30 DIAGNOSIS — M545 Low back pain, unspecified: Secondary | ICD-10-CM

## 2022-10-30 DIAGNOSIS — M170 Bilateral primary osteoarthritis of knee: Secondary | ICD-10-CM

## 2022-10-31 NOTE — Telephone Encounter (Signed)
Unable to refill per protocol, Rx expired. Discontinued 09/24/22. Requested Prescriptions  Pending Prescriptions Disp Refills   ibuprofen (ADVIL) 800 MG tablet [Pharmacy Med Name: IBUPROFEN 800 MG TABLET] 60 tablet 0    Sig: TAKE 1 TABLET BY MOUTH EVERY 8 HOURS AS NEEDED FOR MODERATE PAIN.     Analgesics:  NSAIDS Failed - 10/30/2022 11:00 AM      Failed - Manual Review: Labs are only required if the patient has taken medication for more than 8 weeks.      Failed - HGB in normal range and within 360 days    Hemoglobin  Date Value Ref Range Status  07/26/2022 11.9 (L) 13.0 - 17.7 g/dL Final         Failed - HCT in normal range and within 360 days    Hematocrit  Date Value Ref Range Status  07/26/2022 36.8 (L) 37.5 - 51.0 % Final         Passed - Cr in normal range and within 360 days    Creatinine  Date Value Ref Range Status  06/17/2019 1.37 (H) 0.61 - 1.24 mg/dL Final   Creat  Date Value Ref Range Status  01/16/2017 1.24 0.70 - 1.25 mg/dL Final    Comment:    For patients >62 years of age, the reference limit for Creatinine is approximately 13% higher for people identified as African-American. .    Creatinine, Ser  Date Value Ref Range Status  07/26/2022 1.10 0.76 - 1.27 mg/dL Final   Creatinine,U  Date Value Ref Range Status  11/23/2013 116.45 mg/dL Final    Comment:       Cutoff Values for Urine Drug Screen, Pain Mgmt          Drug Class           Cutoff (ng/mL)          Amphetamines             500          Barbiturates             200          Cocaine Metabolites      150          Benzodiazepines          200          Methadone                300          Opiates                  300          Phencyclidine             25          Propoxyphene             300          Marijuana Metabolites     50    For medical purposes only.   Creatinine, Urine  Date Value Ref Range Status  08/24/2013 301.55 >20.0 mg/dL Final    Comment:    * (PPS) Presumptive  positive screen result to be verified by         quantitative LC/MS or GC/MS confirmation testing.         Passed - PLT in normal range and within 360 days    Platelets  Date Value Ref Range Status  07/26/2022 272 150 -  450 x10E3/uL Final         Passed - eGFR is 30 or above and within 360 days    GFR, Est African American  Date Value Ref Range Status  10/03/2016 76 >=60 mL/min Final   GFR, Est AFR Am  Date Value Ref Range Status  06/17/2019 >60 >60 mL/min Final   GFR calc Af Amer  Date Value Ref Range Status  05/25/2020 88 >59 mL/min/1.73 Final    Comment:    **In accordance with recommendations from the NKF-ASN Task force,**   [facility] is in the process of updating its eGFR calculation to the   2021 CKD-EPI creatinine equation that estimates kidney function   without a race variable.    GFR, Est Non African American  Date Value Ref Range Status  10/03/2016 66 >=60 mL/min Final   GFR, Estimated  Date Value Ref Range Status  06/25/2021 >60 >60 mL/min Final    Comment:    (NOTE) Calculated using the CKD-EPI Creatinine Equation (2021)   06/17/2019 54 (L) >60 mL/min Final   eGFR  Date Value Ref Range Status  07/26/2022 73 >59 mL/min/1.73 Final         Passed - Patient is not pregnant      Passed - Valid encounter within last 12 months    Recent Outpatient Visits           3 months ago Arthralgia, unspecified joint   [facility] Grayce Sessions, NP

## 2022-11-10 ENCOUNTER — Other Ambulatory Visit: Payer: Self-pay | Admitting: Internal Medicine

## 2022-11-10 DIAGNOSIS — I10 Essential (primary) hypertension: Secondary | ICD-10-CM

## 2022-11-12 NOTE — Therapy (Signed)
OUTPATIENT PHYSICAL THERAPY THORACOLUMBAR EVALUATION   Patient Name: Greg Flowers MRN: [id number] DOB:October 15, 1955,67 y.o., male Today's Date: 11/13/2022   END OF SESSION:  PT End of Session - 11/13/22 1404     Visit Number 1    Number of Visits 17    Date for PT Re-Evaluation 01/12/23    Authorization Type BC[employer]    PT Start Time 1404    PT Stop Time 1449    PT Time Calculation (min) 45 min    Activity Tolerance Patient tolerated treatment well    Behavior During Therapy WFL for tasks assessed/performed              Past Medical History:  Diagnosis Date   Anxiety    Calculus, kidney 2000   Sees Dr. Isabel Caprice, urology.    Cervical spondylosis 2004   sp surgery by Dr. Danielle Dess   Depression    GERD (gastroesophageal reflux disease)    Headache(784.0)    Chronic, on vicodin 5 TID for this.    Hepatitis C    Has occasional visits with Dr. Randa Evens GI, failed RX in 2000.  Liver Biopsy 9/06: Minimally active hepatitis consistent with hepatitis C, minimal necroinflamtory activity grade 1, no incrased fibrosis stage 0.    Herpes labialis    HERPES LABIALIS 04/04/2006   Qualifier: Diagnosis of  By: Aundria Rud MD, Roseanne Reno     History of kidney stones    Hypertension    Pneumonia    Renal stone    Spondylolisthesis of lumbar region    Thalassemia    HGB 9/09 14.4 wtih MCV 72.6   Viral upper respiratory illness 04/12/2021   Wears glasses    Past Surgical History:  Procedure Laterality Date   ANTERIOR CERVICAL DECOMP/DISCECTOMY FUSION N/A 11/21/2017   Procedure: ANTERIOR CERVICAL DECOMPRESSION FUSION, CERVICAL THREE-FOUR, CERVICAL FOUR-FIVE WITH INSTRUMENTATION AND ALLOGRAFT;  Surgeon: Estill Bamberg, MD;  Location: [facility] OR;  Service: Orthopedics;  Laterality: N/A;  ANTERIOR CERVICAL DECOMPRESSION FUSION, CERVICAL THREE-FOUR, CERVICAL FOUR-FIVE WITH INSTRUMENTATION AND ALLOGRAFT   BACK SURGERY  2017   L4-L5 PLATE/SCREWS   CERVICAL DISCECTOMY  2004   Dr Danielle Dess   COLONOSCOPY      LITHOTRIPSY  2004   Dr Logan Bores   NASAL SEPTUM SURGERY  2007   TONSILLECTOMY     TOTAL HIP ARTHROPLASTY Left 11/18/2018   Procedure: Left Anterior Hip Arthroplasty;  Surgeon: Marcene Corning, MD;  Location: [facility];  Service: Orthopedics;  Laterality: Left;   Patient Active Problem List   Diagnosis Date Noted   Lateral epicondylitis of right elbow 03/02/2022   Hyperlipidemia 12/30/2020   Alpha thalassaemia minor 05/25/2020   Healthcare maintenance 05/25/2020   Acute gout due to renal impairment involving toe 05/20/2019   Neuropathy 05/18/2019   Primary localized osteoarthritis of left hip 11/18/2018   Primary osteoarthritis of left hip 11/18/2018   Microcytic anemia 08/13/2018   Disc disease, degenerative, cervical 11/01/2017   Liver fibrosis 04/30/2017   NSAID long-term use 01/09/2016   Chronic back pain secondary to DJD of cervical, thoracic and lumbar spine and disc herniation 01/09/2016   Osteoarthritis of both knees 11/02/2011   DISORDER, DEPRESSIVE NEC 09/25/2006   Chronic hepatitis C without hepatic coma (HCC) 02/22/2006   Essential hypertension 02/22/2006    PCP: Grayce Sessions, NP  REFERRING PROVIDER: Trena Platt, DO   REFERRING DIAG:  Diagnosis  M96.1 (ICD-10-CM) - Postlaminectomy syndrome, not elsewhere classified    Rationale for Evaluation and Treatment: Rehabilitation  THERAPY  DIAG:  Other low back pain  Muscle weakness (generalized)  Other abnormalities of gait and mobility  ONSET DATE: chronic   SUBJECTIVE:                                                                                                                                                                                           SUBJECTIVE STATEMENT: "I hurt all over. I had 2 neck surgeries, a back surgery, and hip surgery." He has completed PT following each surgical intervention stating this has been helpful in reducing his pain. He reports aching all over especially when he first  wakes in the morning. He localizes his back pain to the center of his low back and can shoot down the posterior thighs with prolonged standing. He reports chronic pain that has been ongoing for 20+ years attributed to his work at the [employer]. He was recently seen in pain management and was prescribed pain medication and muscle relaxers, which have been somewhat helpful in reducing his pain. He reports neuropathy in bilateral feet Lt>Rt and thinks it could be due to his hip surgery. He denies any changes in bowel/bladder.   PERTINENT HISTORY:  Lumbar fusion 2017 Lt THA 2020  PAIN:  Are you having pain? Yes: NPRS scale: 7 (at worst 10)/10 Pain location: middle of low back Pain description: ache,sharp Aggravating factors: first thing in AM, transferring from sit to stand, lifting,twisting, prolonged standing  Relieving factors: medication, PT previously  PRECAUTIONS: None  WEIGHT BEARING RESTRICTIONS: No  FALLS:  Has patient fallen in last 6 months? No  LIVING ENVIRONMENT: Lives with: lives with their spouse Lives in: House/apartment Stairs: Yes: Internal: 16 steps; on right going up Has following equipment at home: Single point cane  OCCUPATION: retired   PLOF: Independent  PATIENT GOALS: "get a little more flexible and help ease the pain."   OBJECTIVE:   DIAGNOSTIC FINDINGS:  Lumbar X-ray: FINDINGS: No compression deformities. No spondylolisthesis. No motion with flexion and extension to suggest instability. Postop changes L4-5 fusion and discectomy. L5-S1 disc space narrowing with grade 1 L5 retrolisthesis. Endplate sclerosis. Lower thoracic degenerative changes identified as well.   IMPRESSION: Postop and degenerative changes.  PATIENT SURVEYS:  FOTO 38% function to 47% predicted   SCREENING FOR RED FLAGS: Bowel or bladder incontinence: No Cauda equina syndrome: No   COGNITION: Overall cognitive status: Within functional limits for tasks  assessed     SENSATION: WFL  MUSCLE LENGTH: Hamstrings: moderate tightness bilaterally   POSTURE: decreased lumbar lordosis  PALPATION: TTP bilateral lumbar paraspinals; Lt gluteals;  Pain before restriction PAIVM L-spine  LUMBAR ROM:  AROM eval  Flexion Full LBP  Extension 50% limited pain in buttocks  Right lateral flexion 50% limited  Left lateral flexion 50% limited   Right rotation 25% limited  Left rotation 25% limited    (Blank rows = not tested)  LOWER EXTREMITY ROM:     Active  Right eval Left eval  Hip flexion 99 90  Hip extension    Hip abduction    Hip adduction    Hip internal rotation    Hip external rotation    Knee flexion    Knee extension    Ankle dorsiflexion    Ankle plantarflexion    Ankle inversion    Ankle eversion     (Blank rows = not tested)  LOWER EXTREMITY MMT:    MMT Right eval Left eval  Hip flexion 4 4  Hip extension 4 4  Hip abduction 4- 4-  Hip adduction    Hip internal rotation    Hip external rotation    Knee flexion 5 5  Knee extension 5 5  Ankle dorsiflexion 5 5  Ankle plantarflexion 5 5  Ankle inversion    Ankle eversion 5 5   (Blank rows = not tested)  LUMBAR SPECIAL TESTS:  (-) SLR  FUNCTIONAL TESTS:  5 x STS: 16 seconds  : 1,013 ft   GAIT: Distance walked: 6 MWT Assistive device utilized: None Level of assistance: Complete Independence Comments: antalgic Lt; Rt foot in ER during stance, decreased step length   [facility] Adult PT Treatment:                                                DATE: 11/13/22  Therapeutic Exercise: Demonstrated and issued initial HEP.    Therapeutic Activity: Education on assessment findings that will be addressed throughout duration of POC.      PATIENT EDUCATION:  Education details: see treatment Person educated: Patient Education method: Explanation, Demonstration, Tactile cues, Verbal cues, and Handouts Education comprehension: verbalized understanding,  returned demonstration, verbal cues required, tactile cues required, and needs further education  HOME EXERCISE PROGRAM: Access Code: [facility] URL: [email or web address] Date: 11/13/2022 Prepared by: Letitia Libra  Exercises - Supine Lower Trunk Rotation  - 1 x daily - 7 x weekly - 2 sets - 10 reps - Seated Hamstring Stretch  - 1 x daily - 7 x weekly - 3 sets - 30 sec  hold - Hooklying Clamshell with Resistance  - 1 x daily - 7 x weekly - 2 sets - 10 reps  ASSESSMENT:  CLINICAL IMPRESSION: Patient is a 67 y.o. male who was seen today for physical therapy evaluation and treatment for chronic low back pain that he attributes to overuse from his job with the [place] [employer]. He has undergone multiple surgical procedures including lumbar fusion and Lt THA, but continues to report ongoing high pain levels that have somewhat improved recently with medication. Upon assessment he is noted to have limited and painful trunk AROM, hip weakness, postural abnormalities, and scores below age-related normative values on the 6 minute walk test. He will benefit from skilled PT to address the above stated deficits in order to optimize his function and assist in overall pain reduction.   OBJECTIVE IMPAIRMENTS: Abnormal gait, decreased activity tolerance, decreased endurance, decreased knowledge of condition, decreased mobility, difficulty walking, decreased ROM, decreased strength, increased fascial restrictions,  impaired flexibility, improper body mechanics, postural dysfunction, and pain.   ACTIVITY LIMITATIONS: carrying, lifting, bending, standing, squatting, transfers, and locomotion level  PARTICIPATION LIMITATIONS: meal prep, cleaning, laundry, shopping, community activity, and yard work  PERSONAL FACTORS: Age, Fitness, Time since onset of injury/illness/exacerbation, and 3+ comorbidities: see PMH/PSH above  are also affecting patient's functional outcome.   REHAB POTENTIAL: Fair see  personal factors  CLINICAL DECISION MAKING: Evolving/moderate complexity  EVALUATION COMPLEXITY: Moderate   GOALS: Goals reviewed with patient? Yes  SHORT TERM GOALS: Target date: 12/11/2022    Patient will be independent and compliant with initial HEP.   Baseline: see above Goal status: INITIAL  2.  Patient will complete 5 x STS in </=13 seconds to improve ease of transfers.  Baseline: see above  Goal status: INITIAL  3.  Patient will demonstrate proper lifting mechanics without an increase in back pain.  Baseline: increased pain  Goal status: INITIAL   LONG TERM GOALS: Target date: 01/12/2023    Patient will score at least 47% on FOTO to signify clinically meaningful improvement in functional abilities.   Baseline: see above Goal status: INITIAL  2.  Patient will walk at least 1200 ft during 6 MWT to improve ability to navigate in the community.  Baseline: see above Goal status: INITIAL  3.  Patient will demonstrate 4+/5 bilateral hip strength to improve stability about the chain with prolonged walking/standing activity.  Baseline: see above Goal status: INITIAL  4.  Patient will be independent with advanced home program to assist in management of his chronic condition.  Baseline: see above Goal status: INITIAL    PLAN:  PT FREQUENCY: 1-2x/week  PT DURATION: 8 weeks  PLANNED INTERVENTIONS: Therapeutic exercises, Therapeutic activity, Neuromuscular re-education, Balance training, Gait training, Patient/Family education, Self Care, Aquatic Therapy, Dry Needling, Electrical stimulation, Cryotherapy, Moist heat, Manual therapy, and Re-evaluation.  PLAN FOR NEXT SESSION: review and progress HEP prn; trunk mobility, hip stretching, core/hip strengthening, lifting mechanics.   Letitia Libra, PT, DPT, ATC 11/13/22 3:49 PM

## 2022-11-13 ENCOUNTER — Other Ambulatory Visit: Payer: Self-pay

## 2022-11-13 ENCOUNTER — Ambulatory Visit: Payer: Federal, State, Local not specified - PPO | Attending: Primary Care

## 2022-11-13 DIAGNOSIS — R2689 Other abnormalities of gait and mobility: Secondary | ICD-10-CM | POA: Insufficient documentation

## 2022-11-13 DIAGNOSIS — M6281 Muscle weakness (generalized): Secondary | ICD-10-CM | POA: Insufficient documentation

## 2022-11-13 DIAGNOSIS — M5459 Other low back pain: Secondary | ICD-10-CM | POA: Diagnosis present

## 2022-11-13 NOTE — Patient Instructions (Signed)
Aquatic Therapy at [facility]-  What to Expect!  Where:   [facility] [address] [place], [place] [address] Rehab phone [phone]  NOTE:  You will receive an automated phone message reminding you of your appt and it will say the appointment is at the [address] [facility] [address] [facility] clinic.          How to Prepare: Please make sure you drink 8 ounces of water about one hour prior to your pool session A caregiver may attend if needed with the patient to help assist as needed. A caregiver can sit in the pool room on chair. Please arrive IN YOUR SUIT and 15 minutes prior to your appointment - this helps to avoid delays in starting your session. Please make sure to attend to any toileting needs prior to entering the pool Locker rooms for changing are provided.   There is direct access to the pool deck form the locker room.  You can lock your belongings in a locker with lock provided. Once on the pool deck your therapist will ask if you have signed the Patient  Consent and Assignment of Benefits form before beginning treatment Your therapist may take your blood pressure prior to, during and after your session if indicated We usually try and create a home exercise program based on activities we do in the pool.  Please be thinking about who might be able to assist you in the pool should you need to participate in an aquatic home exercise program at the time of discharge if you need assistance.  Some patients do not want to or do not have the ability to participate in an aquatic home program - this is not a barrier in any way to you participating in aquatic therapy as part of your current therapy plan! After Discharge from PT, you can continue using home program at  the [facility]/, there is a drop-in fee for $5 ($45 a month)or for 60 years  or older $4.00 ($40 a month for seniors ) or any local [facility] pool.  Memberships for purchase are  available for gym/pool at [facility]  IT IS VERY IMPORTANT THAT YOUR LAST VISIT BE IN THE CLINIC AT [address] AFTER YOUR LAST AQUATIC VISIT.  PLEASE MAKE SURE THAT YOU HAVE A [address]  APPOINTMENT SCHEDULED.   About the pool: Pool is located approximately 500 FT from the entrance of the building.  Please bring a support person if you need assistance traveling this      distance.   Your therapist will assist you in entering the water; there are two ways to           enter: stairs with railings, and a mechanical lift. Your therapist will determine the most appropriate way for you.  Water temperature is usually between 88-90 degrees  There may be up to 2 other swimmers in the pool at the same time  The pool deck is tile, please wear shoes with good traction if you prefer not to be barefoot.    Contact Info:  For appointment scheduling and cancellations:         Please call the [facility]  PH:[phone]              Aquatic Therapy  [facility]patient [facility]       All sessions are 45 minutes                                                    

## 2022-11-27 NOTE — Therapy (Signed)
OUTPATIENT PHYSICAL THERAPY THORACOLUMBAR TREATMENT NOTE   Patient Name: Greg Flowers MRN: [id number] DOB:1955-08-24,67 y.o., male Today's Date: 11/27/2022   END OF SESSION:     Past Medical History:  Diagnosis Date   Anxiety    Calculus, kidney 2000   Sees Dr. Isabel Caprice, urology.    Cervical spondylosis 2004   sp surgery by Dr. Danielle Dess   Depression    GERD (gastroesophageal reflux disease)    Headache(784.0)    Chronic, on vicodin 5 TID for this.    Hepatitis C    Has occasional visits with Dr. Randa Evens GI, failed RX in 2000.  Liver Biopsy 9/06: Minimally active hepatitis consistent with hepatitis C, minimal necroinflamtory activity grade 1, no incrased fibrosis stage 0.    Herpes labialis    HERPES LABIALIS 04/04/2006   Qualifier: Diagnosis of  By: Aundria Rud MD, Roseanne Reno     History of kidney stones    Hypertension    Pneumonia    Renal stone    Spondylolisthesis of lumbar region    Thalassemia    HGB 9/09 14.4 wtih MCV 72.6   Viral upper respiratory illness 04/12/2021   Wears glasses    Past Surgical History:  Procedure Laterality Date   ANTERIOR CERVICAL DECOMP/DISCECTOMY FUSION N/A 11/21/2017   Procedure: ANTERIOR CERVICAL DECOMPRESSION FUSION, CERVICAL THREE-FOUR, CERVICAL FOUR-FIVE WITH INSTRUMENTATION AND ALLOGRAFT;  Surgeon: Estill Bamberg, MD;  Location: [facility];  Service: Orthopedics;  Laterality: N/A;  ANTERIOR CERVICAL DECOMPRESSION FUSION, CERVICAL THREE-FOUR, CERVICAL FOUR-FIVE WITH INSTRUMENTATION AND ALLOGRAFT   BACK SURGERY  2017   L4-L5 PLATE/SCREWS   CERVICAL DISCECTOMY  2004   Dr Danielle Dess   COLONOSCOPY     LITHOTRIPSY  2004   Dr Logan Bores   NASAL SEPTUM SURGERY  2007   TONSILLECTOMY     TOTAL HIP ARTHROPLASTY Left 11/18/2018   Procedure: Left Anterior Hip Arthroplasty;  Surgeon: Marcene Corning, MD;  Location: [facility];  Service: Orthopedics;  Laterality: Left;   Patient Active Problem List   Diagnosis Date Noted   Lateral epicondylitis of right elbow  03/02/2022   Hyperlipidemia 12/30/2020   Alpha thalassaemia minor 05/25/2020   Healthcare maintenance 05/25/2020   Acute gout due to renal impairment involving toe 05/20/2019   Neuropathy 05/18/2019   Primary localized osteoarthritis of left hip 11/18/2018   Primary osteoarthritis of left hip 11/18/2018   Microcytic anemia 08/13/2018   Disc disease, degenerative, cervical 11/01/2017   Liver fibrosis 04/30/2017   NSAID long-term use 01/09/2016   Chronic back pain secondary to DJD of cervical, thoracic and lumbar spine and disc herniation 01/09/2016   Osteoarthritis of both knees 11/02/2011   DISORDER, DEPRESSIVE NEC 09/25/2006   Chronic hepatitis C without hepatic coma (HCC) 02/22/2006   Essential hypertension 02/22/2006    PCP: Grayce Sessions, NP  REFERRING PROVIDER: Trena Platt, DO   REFERRING DIAG:  Diagnosis  M96.1 (ICD-10-CM) - Postlaminectomy syndrome, not elsewhere classified    Rationale for Evaluation and Treatment: Rehabilitation  THERAPY DIAG:  No diagnosis found.  ONSET DATE: chronic   SUBJECTIVE:  SUBJECTIVE STATEMENT: I've done the HEP some. I hurt all the time.  EVAL: "I hurt all over. I had 2 neck surgeries, a back surgery, and hip surgery." He has completed PT following each surgical intervention stating this has been helpful in reducing his pain. He reports aching all over especially when he first wakes in the morning. He localizes his back pain to the center of his low back and can shoot down the posterior thighs with prolonged standing. He reports chronic pain that has been ongoing for 20+ years attributed to his work at the [employer]. He was recently seen in pain management and was prescribed pain medication and muscle relaxers, which have been somewhat helpful  in reducing his pain. He reports neuropathy in bilateral feet Lt>Rt and thinks it could be due to his hip surgery. He denies any changes in bowel/bladder.   PAIN:  Are you having pain? Yes: NPRS scale: 7 (at worst 10)/10 Pain location: middle of low back Pain description: ache,sharp Aggravating factors: first thing in AM, transferring from sit to stand, lifting,twisting, prolonged standing  Relieving factors: medication, PT previously  PERTINENT HISTORY:  Lumbar fusion 2017 Lt THA 2020  PRECAUTIONS: None  WEIGHT BEARING RESTRICTIONS: No  FALLS:  Has patient fallen in last 6 months? No  LIVING ENVIRONMENT: Lives with: lives with their spouse Lives in: House/apartment Stairs: Yes: Internal: 16 steps; on right going up Has following equipment at home: Single point cane  OCCUPATION: retired   PLOF: Independent  PATIENT GOALS: "get a little more flexible and help ease the pain."   OBJECTIVE:   DIAGNOSTIC FINDINGS:  Lumbar X-ray: FINDINGS: No compression deformities. No spondylolisthesis. No motion with flexion and extension to suggest instability. Postop changes L4-5 fusion and discectomy. L5-S1 disc space narrowing with grade 1 L5 retrolisthesis. Endplate sclerosis. Lower thoracic degenerative changes identified as well.   IMPRESSION: Postop and degenerative changes.  PATIENT SURVEYS:  FOTO 38% function to 47% predicted   SCREENING FOR RED FLAGS: Bowel or bladder incontinence: No Cauda equina syndrome: No   COGNITION: Overall cognitive status: Within functional limits for tasks assessed     SENSATION: WFL  MUSCLE LENGTH: Hamstrings: moderate tightness bilaterally   POSTURE: decreased lumbar lordosis  PALPATION: TTP bilateral lumbar paraspinals; Lt gluteals;  Pain before restriction PAIVM L-spine  LUMBAR ROM:   AROM eval  Flexion Full LBP  Extension 50% limited pain in buttocks  Right lateral flexion 50% limited  Left lateral flexion 50% limited    Right rotation 25% limited  Left rotation 25% limited    (Blank rows = not tested)  LOWER EXTREMITY ROM:     Active  Right eval Left eval  Hip flexion 99 90  Hip extension    Hip abduction    Hip adduction    Hip internal rotation    Hip external rotation    Knee flexion    Knee extension    Ankle dorsiflexion    Ankle plantarflexion    Ankle inversion    Ankle eversion     (Blank rows = not tested)  LOWER EXTREMITY MMT:    MMT Right eval Left eval  Hip flexion 4 4  Hip extension 4 4  Hip abduction 4- 4-  Hip adduction    Hip internal rotation    Hip external rotation    Knee flexion 5 5  Knee extension 5 5  Ankle dorsiflexion 5 5  Ankle plantarflexion 5 5  Ankle inversion    Ankle  eversion 5 5   (Blank rows = not tested)  LUMBAR SPECIAL TESTS:  (-) SLR  FUNCTIONAL TESTS:  5 x STS: 16 seconds  : 1,013 ft   GAIT: Distance walked: 6 MWT Assistive device utilized: None Level of assistance: Complete Independence Comments: antalgic Lt; Rt foot in ER during stance, decreased step length   [facility] Adult PT Treatment:                                                DATE: 11/27/22 Therapeutic Exercise: Nu Step 5 mins L5 UE/LE Supine Lower Trunk Rotation 10 reps Seated Hamstring Stretch  x2 30 sec  hold Hooklying Clamshell with GTB 10 reps HL hip add sets x10 PPT x10 Updated HEP  [facility] Adult PT Treatment:                                                DATE: 11/13/22  Therapeutic Exercise: Demonstrated and issued initial HEP.  Therapeutic Activity: Education on assessment findings that will be addressed throughout duration of POC.   PATIENT EDUCATION:  Education details: see treatment Person educated: Patient Education method: Explanation, Demonstration, Tactile cues, Verbal cues, and Handouts Education comprehension: verbalized understanding, returned demonstration, verbal cues required, tactile cues required, and needs further education  HOME  EXERCISE PROGRAM: Access Code: [facility] URL: [email or web address] Date: 11/28/2022 Prepared by: Joellyn Rued  Exercises - Supine Lower Trunk Rotation  - 1 x daily - 7 x weekly - 2 sets - 10 reps - Seated Hamstring Stretch  - 1 x daily - 7 x weekly - 3 sets - 30 sec  hold - Hooklying Clamshell with Resistance  - 1 x daily - 7 x weekly - 2 sets - 10 reps - Supine Hip Adduction Isometric with Ball  - 1 x daily - 7 x weekly - 2 sets - 10 reps - 3 hold - Supine Posterior Pelvic Tilt  - 1 x daily - 7 x weekly - 2 sets - 10 reps - 3 hold - Supine Bridge  - 1 x daily - 7 x weekly - 2 sets - 10 reps - 3 hold  ASSESSMENT:  CLINICAL IMPRESSION: PT treatment was shortened due to the pt arriving late. PT was completed for lumbopelvic strengthening and flexibility. Additional therex were added today for the hips and core. Pt returned demonstration of and tolerated the prescribed therex without adverse effects. Pt will continue to benefit from skilled PT to address impairments for improved function.   EVAL: Patient is a 67 y.o. male who was seen today for physical therapy evaluation and treatment for chronic low back pain that he attributes to overuse from his job with the [place] [employer]. He has undergone multiple surgical procedures including lumbar fusion and Lt THA, but continues to report ongoing high pain levels that have somewhat improved recently with medication. Upon assessment he is noted to have limited and painful trunk AROM, hip weakness, postural abnormalities, and scores below age-related normative values on the 6 minute walk test. He will benefit from skilled PT to address the above stated deficits in order to optimize his function and assist in overall pain reduction.   OBJECTIVE IMPAIRMENTS: Abnormal gait, decreased activity tolerance, decreased endurance,  decreased knowledge of condition, decreased mobility, difficulty walking, decreased ROM, decreased strength, increased  fascial restrictions, impaired flexibility, improper body mechanics, postural dysfunction, and pain.   ACTIVITY LIMITATIONS: carrying, lifting, bending, standing, squatting, transfers, and locomotion level  PARTICIPATION LIMITATIONS: meal prep, cleaning, laundry, shopping, community activity, and yard work  PERSONAL FACTORS: Age, Fitness, Time since onset of injury/illness/exacerbation, and 3+ comorbidities: see PMH/PSH above  are also affecting patient's functional outcome.   REHAB POTENTIAL: Fair see personal factors  CLINICAL DECISION MAKING: Evolving/moderate complexity  EVALUATION COMPLEXITY: Moderate   GOALS: Goals reviewed with patient? Yes  SHORT TERM GOALS: Target date: 12/11/2022    Patient will be independent and compliant with initial HEP.   Baseline: see above Goal status: INITIAL  2.  Patient will complete 5 x STS in </=13 seconds to improve ease of transfers.  Baseline: see above  Goal status: INITIAL  3.  Patient will demonstrate proper lifting mechanics without an increase in back pain.  Baseline: increased pain  Goal status: INITIAL   LONG TERM GOALS: Target date: 01/12/2023    Patient will score at least 47% on FOTO to signify clinically meaningful improvement in functional abilities.   Baseline: see above Goal status: INITIAL  2.  Patient will walk at least 1200 ft during 6 MWT to improve ability to navigate in the community.  Baseline: see above Goal status: INITIAL  3.  Patient will demonstrate 4+/5 bilateral hip strength to improve stability about the chain with prolonged walking/standing activity.  Baseline: see above Goal status: INITIAL  4.  Patient will be independent with advanced home program to assist in management of his chronic condition.  Baseline: see above Goal status: INITIAL    PLAN:  PT FREQUENCY: 1-2x/week  PT DURATION: 8 weeks  PLANNED INTERVENTIONS: Therapeutic exercises, Therapeutic activity, Neuromuscular  re-education, Balance training, Gait training, Patient/Family education, Self Care, Aquatic Therapy, Dry Needling, Electrical stimulation, Cryotherapy, Moist heat, Manual therapy, and Re-evaluation.  PLAN FOR NEXT SESSION: review and progress HEP prn; trunk mobility, hip stretching, core/hip strengthening, lifting mechanics.     MS, PT 11/27/22 7:34 AM

## 2022-11-28 ENCOUNTER — Ambulatory Visit: Payer: Federal, State, Local not specified - PPO | Attending: Primary Care

## 2022-11-28 DIAGNOSIS — M7062 Trochanteric bursitis, left hip: Secondary | ICD-10-CM | POA: Diagnosis present

## 2022-11-28 DIAGNOSIS — R2689 Other abnormalities of gait and mobility: Secondary | ICD-10-CM | POA: Insufficient documentation

## 2022-11-28 DIAGNOSIS — M5459 Other low back pain: Secondary | ICD-10-CM | POA: Diagnosis present

## 2022-11-28 DIAGNOSIS — M6281 Muscle weakness (generalized): Secondary | ICD-10-CM | POA: Insufficient documentation

## 2022-11-28 DIAGNOSIS — M7061 Trochanteric bursitis, right hip: Secondary | ICD-10-CM | POA: Insufficient documentation

## 2022-12-03 ENCOUNTER — Encounter: Payer: Self-pay | Admitting: Physical Therapy

## 2022-12-03 ENCOUNTER — Ambulatory Visit: Payer: Federal, State, Local not specified - PPO | Admitting: Physical Therapy

## 2022-12-03 DIAGNOSIS — M5459 Other low back pain: Secondary | ICD-10-CM | POA: Diagnosis not present

## 2022-12-03 DIAGNOSIS — M6281 Muscle weakness (generalized): Secondary | ICD-10-CM

## 2022-12-03 NOTE — Therapy (Signed)
OUTPATIENT PHYSICAL THERAPY THORACOLUMBAR TREATMENT NOTE   Patient Name: Greg Flowers MRN: [id number] DOB:07-08-1955,67 y.o., male Today's Date: 12/03/2022   END OF SESSION:  PT End of Session - 12/03/22 1416     Visit Number 3    Number of Visits 17    Date for PT Re-Evaluation 01/12/23    Authorization Type [employer]    PT Start Time 1405    PT Stop Time 1445    PT Time Calculation (min) 40 min               Past Medical History:  Diagnosis Date   Anxiety    Calculus, kidney 2000   Sees Dr. Isabel Caprice, urology.    Cervical spondylosis 2004   sp surgery by Dr. Danielle Dess   Depression    GERD (gastroesophageal reflux disease)    Headache(784.0)    Chronic, on vicodin 5 TID for this.    Hepatitis C    Has occasional visits with Dr. Randa Evens GI, failed RX in 2000.  Liver Biopsy 9/06: Minimally active hepatitis consistent with hepatitis C, minimal necroinflamtory activity grade 1, no incrased fibrosis stage 0.    Herpes labialis    HERPES LABIALIS 04/04/2006   Qualifier: Diagnosis of  By: Aundria Rud MD, Roseanne Reno     History of kidney stones    Hypertension    Pneumonia    Renal stone    Spondylolisthesis of lumbar region    Thalassemia    HGB 9/09 14.4 wtih MCV 72.6   Viral upper respiratory illness 04/12/2021   Wears glasses    Past Surgical History:  Procedure Laterality Date   ANTERIOR CERVICAL DECOMP/DISCECTOMY FUSION N/A 11/21/2017   Procedure: ANTERIOR CERVICAL DECOMPRESSION FUSION, CERVICAL THREE-FOUR, CERVICAL FOUR-FIVE WITH INSTRUMENTATION AND ALLOGRAFT;  Surgeon: Estill Bamberg, MD;  Location: [facility];  Service: Orthopedics;  Laterality: N/A;  ANTERIOR CERVICAL DECOMPRESSION FUSION, CERVICAL THREE-FOUR, CERVICAL FOUR-FIVE WITH INSTRUMENTATION AND ALLOGRAFT   BACK SURGERY  2017   L4-L5 PLATE/SCREWS   CERVICAL DISCECTOMY  2004   Dr Danielle Dess   COLONOSCOPY     LITHOTRIPSY  2004   Dr Logan Bores   NASAL SEPTUM SURGERY  2007   TONSILLECTOMY     TOTAL HIP ARTHROPLASTY Left  11/18/2018   Procedure: Left Anterior Hip Arthroplasty;  Surgeon: Marcene Corning, MD;  Location: [facility];  Service: Orthopedics;  Laterality: Left;   Patient Active Problem List   Diagnosis Date Noted   Lateral epicondylitis of right elbow 03/02/2022   Hyperlipidemia 12/30/2020   Alpha thalassaemia minor 05/25/2020   Healthcare maintenance 05/25/2020   Acute gout due to renal impairment involving toe 05/20/2019   Neuropathy 05/18/2019   Primary localized osteoarthritis of left hip 11/18/2018   Primary osteoarthritis of left hip 11/18/2018   Microcytic anemia 08/13/2018   Disc disease, degenerative, cervical 11/01/2017   Liver fibrosis 04/30/2017   NSAID long-term use 01/09/2016   Chronic back pain secondary to DJD of cervical, thoracic and lumbar spine and disc herniation 01/09/2016   Osteoarthritis of both knees 11/02/2011   DISORDER, DEPRESSIVE NEC 09/25/2006   Chronic hepatitis C without hepatic coma (HCC) 02/22/2006   Essential hypertension 02/22/2006    PCP: Grayce Sessions, NP  REFERRING PROVIDER: Trena Platt, DO   REFERRING DIAG:  Diagnosis  M96.1 (ICD-10-CM) - Postlaminectomy syndrome, not elsewhere classified    Rationale for Evaluation and Treatment: Rehabilitation  THERAPY DIAG:  Other low back pain  Muscle weakness (generalized)  ONSET DATE: chronic   SUBJECTIVE:  SUBJECTIVE STATEMENT: I've been doing the exercises. Still about a 7/10. I want to get my flexibility back.   EVAL: "I hurt all over. I had 2 neck surgeries, a back surgery, and hip surgery." He has completed PT following each surgical intervention stating this has been helpful in reducing his pain. He reports aching all over especially when he first wakes in the morning. He localizes his back pain to the  center of his low back and can shoot down the posterior thighs with prolonged standing. He reports chronic pain that has been ongoing for 20+ years attributed to his work at the [employer]. He was recently seen in pain management and was prescribed pain medication and muscle relaxers, which have been somewhat helpful in reducing his pain. He reports neuropathy in bilateral feet Lt>Rt and thinks it could be due to his hip surgery. He denies any changes in bowel/bladder.   PAIN:  Are you having pain? Yes: NPRS scale: 7 (at worst 10)/10 Pain location: middle of low back Pain description: ache,sharp Aggravating factors: first thing in AM, transferring from sit to stand, lifting,twisting, prolonged standing  Relieving factors: medication, PT previously  PERTINENT HISTORY:  Lumbar fusion 2017 Lt THA 2020  PRECAUTIONS: None  WEIGHT BEARING RESTRICTIONS: No  FALLS:  Has patient fallen in last 6 months? No  LIVING ENVIRONMENT: Lives with: lives with their spouse Lives in: House/apartment Stairs: Yes: Internal: 16 steps; on right going up Has following equipment at home: Single point cane  OCCUPATION: retired   PLOF: Independent  PATIENT GOALS: "get a little more flexible and help ease the pain."   OBJECTIVE:   DIAGNOSTIC FINDINGS:  Lumbar X-ray: FINDINGS: No compression deformities. No spondylolisthesis. No motion with flexion and extension to suggest instability. Postop changes L4-5 fusion and discectomy. L5-S1 disc space narrowing with grade 1 L5 retrolisthesis. Endplate sclerosis. Lower thoracic degenerative changes identified as well.   IMPRESSION: Postop and degenerative changes.  PATIENT SURVEYS:  FOTO 38% function to 47% predicted   SCREENING FOR RED FLAGS: Bowel or bladder incontinence: No Cauda equina syndrome: No   COGNITION: Overall cognitive status: Within functional limits for tasks assessed     SENSATION: WFL  MUSCLE LENGTH: Hamstrings: moderate  tightness bilaterally   POSTURE: decreased lumbar lordosis  PALPATION: TTP bilateral lumbar paraspinals; Lt gluteals;  Pain before restriction PAIVM L-spine  LUMBAR ROM:   AROM eval  Flexion Full LBP  Extension 50% limited pain in buttocks  Right lateral flexion 50% limited  Left lateral flexion 50% limited   Right rotation 25% limited  Left rotation 25% limited    (Blank rows = not tested)  LOWER EXTREMITY ROM:     Active  Right eval Left eval  Hip flexion 99 90  Hip extension    Hip abduction    Hip adduction    Hip internal rotation    Hip external rotation    Knee flexion    Knee extension    Ankle dorsiflexion    Ankle plantarflexion    Ankle inversion    Ankle eversion     (Blank rows = not tested)  LOWER EXTREMITY MMT:    MMT Right eval Left eval  Hip flexion 4 4  Hip extension 4 4  Hip abduction 4- 4-  Hip adduction    Hip internal rotation    Hip external rotation    Knee flexion 5 5  Knee extension 5 5  Ankle dorsiflexion 5 5  Ankle plantarflexion 5 5  Ankle inversion    Ankle eversion 5 5   (Blank rows = not tested)  LUMBAR SPECIAL TESTS:  (-) SLR  FUNCTIONAL TESTS:  5 x STS: 16 seconds  : 1,013 ft   GAIT: Distance walked: 6 MWT Assistive device utilized: None Level of assistance: Complete Independence Comments: antalgic Lt; Rt foot in ER during stance, decreased step length   [facility] Adult PT Treatment:                                                DATE: 12/03/22 Therapeutic Exercise: Nustep L6 UE/LE x 5 minutes  Bridge 5 sec x 10 Bridge with ball squeeze  5 sec x 10  Bridge with blue clam 5 sec x 10 SKTC x 2 each  LTR x 10 PPT 5 sec x 15  Seated lumbar flexion with laterals    [facility] Adult PT Treatment:                                                DATE: 8/13/245 Therapeutic Exercise: Nu Step 5 mins L5 UE/LE Supine Lower Trunk Rotation 10 reps Seated Hamstring Stretch  x2 30 sec  hold Hooklying Clamshell with GTB 10  reps HL hip add sets x10 PPT x10 Updated HEP  [facility] Adult PT Treatment:                                                DATE: 11/13/22  Therapeutic Exercise: Demonstrated and issued initial HEP.  Therapeutic Activity: Education on assessment findings that will be addressed throughout duration of POC.   PATIENT EDUCATION:  Education details: see treatment Person educated: Patient Education method: Explanation, Demonstration, Tactile cues, Verbal cues, and Handouts Education comprehension: verbalized understanding, returned demonstration, verbal cues required, tactile cues required, and needs further education  HOME EXERCISE PROGRAM: Access Code: [facility] URL: [email or web address] Date: 11/28/2022 Prepared by: Joellyn Rued  Exercises - Supine Lower Trunk Rotation  - 1 x daily - 7 x weekly - 2 sets - 10 reps - Seated Hamstring Stretch  - 1 x daily - 7 x weekly - 3 sets - 30 sec  hold - Hooklying Clamshell with Resistance  - 1 x daily - 7 x weekly - 2 sets - 10 reps - Supine Hip Adduction Isometric with Ball  - 1 x daily - 7 x weekly - 2 sets - 10 reps - 3 hold - Supine Posterior Pelvic Tilt  - 1 x daily - 7 x weekly - 2 sets - 10 reps - 3 hold - Supine Bridge  - 1 x daily - 7 x weekly - 2 sets - 10 reps - 3 hold  ASSESSMENT:  CLINICAL IMPRESSION: PT was completed for lumbopelvic strengthening and flexibility. Additional therex were added today for the hips and core. Pt returned demonstration of and tolerated the prescribed therex without adverse effects. Pt will continue to benefit from skilled PT to address impairments for improved function. He will begin aquatics next week.   EVAL: Patient is a 67 y.o. male who was seen today for physical therapy evaluation and  treatment for chronic low back pain that he attributes to overuse from his job with the [place] [employer]. He has undergone multiple surgical procedures including lumbar fusion and Lt THA, but continues to report  ongoing high pain levels that have somewhat improved recently with medication. Upon assessment he is noted to have limited and painful trunk AROM, hip weakness, postural abnormalities, and scores below age-related normative values on the 6 minute walk test. He will benefit from skilled PT to address the above stated deficits in order to optimize his function and assist in overall pain reduction.   OBJECTIVE IMPAIRMENTS: Abnormal gait, decreased activity tolerance, decreased endurance, decreased knowledge of condition, decreased mobility, difficulty walking, decreased ROM, decreased strength, increased fascial restrictions, impaired flexibility, improper body mechanics, postural dysfunction, and pain.   ACTIVITY LIMITATIONS: carrying, lifting, bending, standing, squatting, transfers, and locomotion level  PARTICIPATION LIMITATIONS: meal prep, cleaning, laundry, shopping, community activity, and yard work  PERSONAL FACTORS: Age, Fitness, Time since onset of injury/illness/exacerbation, and 3+ comorbidities: see PMH/PSH above  are also affecting patient's functional outcome.   REHAB POTENTIAL: Fair see personal factors  CLINICAL DECISION MAKING: Evolving/moderate complexity  EVALUATION COMPLEXITY: Moderate   GOALS: Goals reviewed with patient? Yes  SHORT TERM GOALS: Target date: 12/11/2022    Patient will be independent and compliant with initial HEP.   Baseline: see above Goal status: INITIAL  2.  Patient will complete 5 x STS in </=13 seconds to improve ease of transfers.  Baseline: see above  Goal status: INITIAL  3.  Patient will demonstrate proper lifting mechanics without an increase in back pain.  Baseline: increased pain  Goal status: INITIAL   LONG TERM GOALS: Target date: 01/12/2023    Patient will score at least 47% on FOTO to signify clinically meaningful improvement in functional abilities.   Baseline: see above Goal status: INITIAL  2.  Patient will walk at  least 1200 ft during 6 MWT to improve ability to navigate in the community.  Baseline: see above Goal status: INITIAL  3.  Patient will demonstrate 4+/5 bilateral hip strength to improve stability about the chain with prolonged walking/standing activity.  Baseline: see above Goal status: INITIAL  4.  Patient will be independent with advanced home program to assist in management of his chronic condition.  Baseline: see above Goal status: INITIAL    PLAN:  PT FREQUENCY: 1-2x/week  PT DURATION: 8 weeks  PLANNED INTERVENTIONS: Therapeutic exercises, Therapeutic activity, Neuromuscular re-education, Balance training, Gait training, Patient/Family education, Self Care, Aquatic Therapy, Dry Needling, Electrical stimulation, Cryotherapy, Moist heat, Manual therapy, and Re-evaluation.  PLAN FOR NEXT SESSION: review and progress HEP prn; trunk mobility, hip stretching, core/hip strengthening, lifting mechanics.   Jannette Spanner, PTA 12/03/22 2:49 PM Phone: [phone] Fax: [phone]

## 2022-12-07 ENCOUNTER — Ambulatory Visit: Payer: Federal, State, Local not specified - PPO

## 2022-12-08 ENCOUNTER — Other Ambulatory Visit: Payer: Self-pay | Admitting: Student

## 2022-12-10 NOTE — Therapy (Signed)
OUTPATIENT PHYSICAL THERAPY THORACOLUMBAR TREATMENT NOTE   Patient Name: Greg Flowers MRN: [id number] DOB:[birth year],67 y.o., male Today's Date: 12/11/2022   END OF SESSION:  PT End of Session - 12/11/22 1425     Visit Number 4    Number of Visits 17    Date for PT Re-Evaluation 01/12/23    PT Start Time 1419    PT Stop Time 1500    PT Time Calculation (min) 41 min    Activity Tolerance Patient tolerated treatment well    Behavior During Therapy [facility] for tasks assessed/performed                Past Medical History:  Diagnosis Date   Anxiety    Calculus, kidney 2000   Sees Dr. Isabel Caprice, urology.    Cervical spondylosis 2004   sp surgery by Dr. Danielle Dess   Depression    GERD (gastroesophageal reflux disease)    Headache(784.0)    Chronic, on vicodin 5 TID for this.    Hepatitis C    Has occasional visits with Dr. Randa Evens GI, failed RX in 2000.  Liver Biopsy 9/06: Minimally active hepatitis consistent with hepatitis C, minimal necroinflamtory activity grade 1, no incrased fibrosis stage 0.    Herpes labialis    HERPES LABIALIS 04/04/2006   Qualifier: Diagnosis of  By: Aundria Rud MD, Roseanne Reno     History of kidney stones    Hypertension    Pneumonia    Renal stone    Spondylolisthesis of lumbar region    Thalassemia    HGB 9/09 14.4 wtih MCV 72.6   Viral upper respiratory illness 04/12/2021   Wears glasses    Past Surgical History:  Procedure Laterality Date   ANTERIOR CERVICAL DECOMP/DISCECTOMY FUSION N/A 11/21/2017   Procedure: ANTERIOR CERVICAL DECOMPRESSION FUSION, CERVICAL THREE-FOUR, CERVICAL FOUR-FIVE WITH INSTRUMENTATION AND ALLOGRAFT;  Surgeon: Estill Bamberg, MD;  Location: [facility] OR;  Service: Orthopedics;  Laterality: N/A;  ANTERIOR CERVICAL DECOMPRESSION FUSION, CERVICAL THREE-FOUR, CERVICAL FOUR-FIVE WITH INSTRUMENTATION AND ALLOGRAFT   BACK SURGERY  2017   L4-L5 PLATE/SCREWS   CERVICAL DISCECTOMY  2004   Dr Danielle Dess   COLONOSCOPY     LITHOTRIPSY  2004   Dr  Logan Bores   NASAL SEPTUM SURGERY  2007   TONSILLECTOMY     TOTAL HIP ARTHROPLASTY Left 11/18/2018   Procedure: Left Anterior Hip Arthroplasty;  Surgeon: Marcene Corning, MD;  Location: [facility] ORS;  Service: Orthopedics;  Laterality: Left;   Patient Active Problem List   Diagnosis Date Noted   Lateral epicondylitis of right elbow 03/02/2022   Hyperlipidemia 12/30/2020   Alpha thalassaemia minor 05/25/2020   Healthcare maintenance 05/25/2020   Acute gout due to renal impairment involving toe 05/20/2019   Neuropathy 05/18/2019   Primary localized osteoarthritis of left hip 11/18/2018   Primary osteoarthritis of left hip 11/18/2018   Microcytic anemia 08/13/2018   Disc disease, degenerative, cervical 11/01/2017   Liver fibrosis 04/30/2017   NSAID long-term use 01/09/2016   Chronic back pain secondary to DJD of cervical, thoracic and lumbar spine and disc herniation 01/09/2016   Osteoarthritis of both knees 11/02/2011   DISORDER, DEPRESSIVE NEC 09/25/2006   Chronic hepatitis C without hepatic coma (HCC) 02/22/2006   Essential hypertension 02/22/2006    PCP: Grayce Sessions, NP  REFERRING PROVIDER: Trena Platt, DO   REFERRING DIAG:  Diagnosis  M96.1 (ICD-10-CM) - Postlaminectomy syndrome, not elsewhere classified    Rationale for Evaluation and Treatment: Rehabilitation  THERAPY DIAG:  Other  low back pain  Muscle weakness (generalized)  Other abnormalities of gait and mobility  Greater trochanteric bursitis of both hips  ONSET DATE: chronic   SUBJECTIVE:                                                                                                                                                                                           SUBJECTIVE STATEMENT: My 4yo grandson jumped on my R abdomin last week and that area is still sore. Pt reports consistent completion of his HEP.  EVAL: "I hurt all over. I had 2 neck surgeries, a back surgery, and hip surgery." He has  completed PT following each surgical intervention stating this has been helpful in reducing his pain. He reports aching all over especially when he first wakes in the morning. He localizes his back pain to the center of his low back and can shoot down the posterior thighs with prolonged standing. He reports chronic pain that has been ongoing for 20+ years attributed to his work at the [employer]. He was recently seen in pain management and was prescribed pain medication and muscle relaxers, which have been somewhat helpful in reducing his pain. He reports neuropathy in bilateral feet Lt>Rt and thinks it could be due to his hip surgery. He denies any changes in bowel/bladder.   PAIN:  Are you having pain? Yes: NPRS scale: 7 (at worst 10)/10 Pain location: middle of low back Pain description: ache,sharp Aggravating factors: first thing in AM, transferring from sit to stand, lifting,twisting, prolonged standing  Relieving factors: medication, PT previously  PERTINENT HISTORY:  Lumbar fusion 2017 Lt THA 2020  PRECAUTIONS: None  WEIGHT BEARING RESTRICTIONS: No  FALLS:  Has patient fallen in last 6 months? No  LIVING ENVIRONMENT: Lives with: lives with their spouse Lives in: House/apartment Stairs: Yes: Internal: 16 steps; on right going up Has following equipment at home: Single point cane  OCCUPATION: retired   PLOF: Independent  PATIENT GOALS: "get a little more flexible and help ease the pain."   OBJECTIVE:   DIAGNOSTIC FINDINGS:  Lumbar X-ray: FINDINGS: No compression deformities. No spondylolisthesis. No motion with flexion and extension to suggest instability. Postop changes L4-5 fusion and discectomy. L5-S1 disc space narrowing with grade 1 L5 retrolisthesis. Endplate sclerosis. Lower thoracic degenerative changes identified as well.   IMPRESSION: Postop and degenerative changes.  PATIENT SURVEYS:  FOTO 38% function to 47% predicted   SCREENING FOR RED  FLAGS: Bowel or bladder incontinence: No Cauda equina syndrome: No   COGNITION: Overall cognitive status: Within functional limits for tasks assessed     SENSATION: WFL  MUSCLE LENGTH:  Hamstrings: moderate tightness bilaterally   POSTURE: decreased lumbar lordosis  PALPATION: TTP bilateral lumbar paraspinals; Lt gluteals;  Pain before restriction PAIVM L-spine  LUMBAR ROM:   AROM eval  Flexion Full LBP  Extension 50% limited pain in buttocks  Right lateral flexion 50% limited  Left lateral flexion 50% limited   Right rotation 25% limited  Left rotation 25% limited    (Blank rows = not tested)  LOWER EXTREMITY ROM:     Active  Right eval Left eval  Hip flexion 99 90  Hip extension    Hip abduction    Hip adduction    Hip internal rotation    Hip external rotation    Knee flexion    Knee extension    Ankle dorsiflexion    Ankle plantarflexion    Ankle inversion    Ankle eversion     (Blank rows = not tested)  LOWER EXTREMITY MMT:    MMT Right eval Left eval  Hip flexion 4 4  Hip extension 4 4  Hip abduction 4- 4-  Hip adduction    Hip internal rotation    Hip external rotation    Knee flexion 5 5  Knee extension 5 5  Ankle dorsiflexion 5 5  Ankle plantarflexion 5 5  Ankle inversion    Ankle eversion 5 5   (Blank rows = not tested)  LUMBAR SPECIAL TESTS:  (-) SLR  FUNCTIONAL TESTS:  5 x STS: 16 seconds  : 1,013 ft   GAIT: Distance walked: 6 MWT Assistive device utilized: None Level of assistance: Complete Independence Comments: antalgic Lt; Rt foot in ER during stance, decreased step length   [facility] Adult PT Treatment:                                                DATE: 12/11/22 Therapeutic Exercise: Nustep L6 UE/LE x 5 minutes  Seated Hamstring Stretch  x2 30 sec  Seated forward fexion stretch  x2 30 LTR x5 5" PPT x10 3" 90/90 abd bracing x5 10" Bridge c PPT x15 5" H/L Clams x15 5"BluTB STS x10 5xSTS 13"  [facility] Adult PT  Treatment:                                                DATE: 12/03/22 Therapeutic Exercise: Nustep L6 UE/LE x 5 minutes  Bridge 5 sec x 10 Bridge with ball squeeze  5 sec x 10  Bridge with blue clam 5 sec x 10 SKTC x 2 each  LTR x 10 PPT 5 sec x 15  Seated lumbar flexion with laterals  [facility] Adult PT Treatment:                                                DATE: 8/13/245 Therapeutic Exercise: Nu Step 5 mins L5 UE/LE Supine Lower Trunk Rotation 10 reps Seated Hamstring Stretch  x2 30 sec  hold Hooklying Clamshell with GTB 10 reps HL hip add sets x10 PPT x10 Updated HEP  PATIENT EDUCATION:  Education details: see treatment Person educated: Patient Education method: Explanation,  Demonstration, Tactile cues, Verbal cues, and Handouts Education comprehension: verbalized understanding, returned demonstration, verbal cues required, tactile cues required, and needs further education  HOME EXERCISE PROGRAM: Access Code: [facility] URL: [email or web address] Date: 11/28/2022 Prepared by: Joellyn Rued  Exercises - Supine Lower Trunk Rotation  - 1 x daily - 7 x weekly - 2 sets - 10 reps - Seated Hamstring Stretch  - 1 x daily - 7 x weekly - 3 sets - 30 sec  hold - Hooklying Clamshell with Resistance  - 1 x daily - 7 x weekly - 2 sets - 10 reps - Supine Hip Adduction Isometric with Ball  - 1 x daily - 7 x weekly - 2 sets - 10 reps - 3 hold - Supine Posterior Pelvic Tilt  - 1 x daily - 7 x weekly - 2 sets - 10 reps - 3 hold - Supine Bridge  - 1 x daily - 7 x weekly - 2 sets - 10 reps - 3 hold  ASSESSMENT:  CLINICAL IMPRESSION: PT was continued for lumbopelvic strengthening and flexibility. Reassessed 5xSTS which was found decreased indicating improved core and LE strength. Pt also demonstrates proper technique with HEP therex. STG's #1 and #2 have been met. Pt's report of pain remains the same. Pt is making appropriate progress. Pt tolerated PT today without adverse effects   Pt starts aquatic PT tomorrow.   EVAL: Patient is a 67 y.o. male who was seen today for physical therapy evaluation and treatment for chronic low back pain that he attributes to overuse from his job with the [place] [employer]. He has undergone multiple surgical procedures including lumbar fusion and Lt THA, but continues to report ongoing high pain levels that have somewhat improved recently with medication. Upon assessment he is noted to have limited and painful trunk AROM, hip weakness, postural abnormalities, and scores below age-related normative values on the 6 minute walk test. He will benefit from skilled PT to address the above stated deficits in order to optimize his function and assist in overall pain reduction.   OBJECTIVE IMPAIRMENTS: Abnormal gait, decreased activity tolerance, decreased endurance, decreased knowledge of condition, decreased mobility, difficulty walking, decreased ROM, decreased strength, increased fascial restrictions, impaired flexibility, improper body mechanics, postural dysfunction, and pain.   ACTIVITY LIMITATIONS: carrying, lifting, bending, standing, squatting, transfers, and locomotion level  PARTICIPATION LIMITATIONS: meal prep, cleaning, laundry, shopping, community activity, and yard work  PERSONAL FACTORS: Age, Fitness, Time since onset of injury/illness/exacerbation, and 3+ comorbidities: see PMH/PSH above  are also affecting patient's functional outcome.   REHAB POTENTIAL: Fair see personal factors  CLINICAL DECISION MAKING: Evolving/moderate complexity  EVALUATION COMPLEXITY: Moderate   GOALS: Goals reviewed with patient? Yes  SHORT TERM GOALS: Target date: 12/11/2022    Patient will be independent and compliant with initial HEP.   Baseline: see above Goal status: MET- pt is completing his HEP correctly  2.  Patient will complete 5 x STS in </=13 seconds to improve ease of transfers.  Baseline: see above   12/11/22: 13" Goal status:  MET  3.  Patient will demonstrate proper lifting mechanics without an increase in back pain.  Baseline: increased pain  Goal status: INITIAL   LONG TERM GOALS: Target date: 01/12/2023    Patient will score at least 47% on FOTO to signify clinically meaningful improvement in functional abilities.   Baseline: see above Goal status: INITIAL  2.  Patient will walk at least 1200 ft during 6 MWT to improve ability to navigate  in the community.  Baseline: see above Goal status: INITIAL  3.  Patient will demonstrate 4+/5 bilateral hip strength to improve stability about the chain with prolonged walking/standing activity.  Baseline: see above Goal status: INITIAL  4.  Patient will be independent with advanced home program to assist in management of his chronic condition.  Baseline: see above Goal status: INITIAL    PLAN:  PT FREQUENCY: 1-2x/week  PT DURATION: 8 weeks  PLANNED INTERVENTIONS: Therapeutic exercises, Therapeutic activity, Neuromuscular re-education, Balance training, Gait training, Patient/Family education, Self Care, Aquatic Therapy, Dry Needling, Electrical stimulation, Cryotherapy, Moist heat, Manual therapy, and Re-evaluation.  PLAN FOR NEXT SESSION: review and progress HEP prn; trunk mobility, hip stretching, core/hip strengthening, lifting mechanics.   Avi Archuleta MS, PT 12/11/22 3:15 PM

## 2022-12-11 ENCOUNTER — Ambulatory Visit: Payer: Federal, State, Local not specified - PPO

## 2022-12-11 DIAGNOSIS — R2689 Other abnormalities of gait and mobility: Secondary | ICD-10-CM

## 2022-12-11 DIAGNOSIS — M5459 Other low back pain: Secondary | ICD-10-CM | POA: Diagnosis not present

## 2022-12-11 DIAGNOSIS — M6281 Muscle weakness (generalized): Secondary | ICD-10-CM

## 2022-12-11 DIAGNOSIS — M7061 Trochanteric bursitis, right hip: Secondary | ICD-10-CM

## 2022-12-11 NOTE — Therapy (Signed)
OUTPATIENT PHYSICAL THERAPY THORACOLUMBAR TREATMENT NOTE   Patient Name: Greg Flowers MRN: [id number] DOB:1956/04/03,67 y.o., male Today's Date: 12/12/2022   END OF SESSION:  PT End of Session - 12/12/22 1416     Visit Number 5    Number of Visits 17    Date for PT Re-Evaluation 01/12/23    Authorization Type [employer]    PT Start Time 1415    PT Stop Time 1500    PT Time Calculation (min) 45 min    Activity Tolerance Patient tolerated treatment well    Behavior During Therapy [facility] for tasks assessed/performed                 Past Medical History:  Diagnosis Date   Anxiety    Calculus, kidney 2000   Sees Dr. Isabel Caprice, urology.    Cervical spondylosis 2004   sp surgery by Dr. Danielle Dess   Depression    GERD (gastroesophageal reflux disease)    Headache(784.0)    Chronic, on vicodin 5 TID for this.    Hepatitis C    Has occasional visits with Dr. Randa Evens GI, failed RX in 2000.  Liver Biopsy 9/06: Minimally active hepatitis consistent with hepatitis C, minimal necroinflamtory activity grade 1, no incrased fibrosis stage 0.    Herpes labialis    HERPES LABIALIS 04/04/2006   Qualifier: Diagnosis of  By: Aundria Rud MD, Roseanne Reno     History of kidney stones    Hypertension    Pneumonia    Renal stone    Spondylolisthesis of lumbar region    Thalassemia    HGB 9/09 14.4 wtih MCV 72.6   Viral upper respiratory illness 04/12/2021   Wears glasses    Past Surgical History:  Procedure Laterality Date   ANTERIOR CERVICAL DECOMP/DISCECTOMY FUSION N/A 11/21/2017   Procedure: ANTERIOR CERVICAL DECOMPRESSION FUSION, CERVICAL THREE-FOUR, CERVICAL FOUR-FIVE WITH INSTRUMENTATION AND ALLOGRAFT;  Surgeon: Estill Bamberg, MD;  Location: [facility] OR;  Service: Orthopedics;  Laterality: N/A;  ANTERIOR CERVICAL DECOMPRESSION FUSION, CERVICAL THREE-FOUR, CERVICAL FOUR-FIVE WITH INSTRUMENTATION AND ALLOGRAFT   BACK SURGERY  2017   L4-L5 PLATE/SCREWS   CERVICAL DISCECTOMY  2004   Dr Danielle Dess   COLONOSCOPY      LITHOTRIPSY  2004   Dr Logan Bores   NASAL SEPTUM SURGERY  2007   TONSILLECTOMY     TOTAL HIP ARTHROPLASTY Left 11/18/2018   Procedure: Left Anterior Hip Arthroplasty;  Surgeon: Marcene Corning, MD;  Location: [facility];  Service: Orthopedics;  Laterality: Left;   Patient Active Problem List   Diagnosis Date Noted   Lateral epicondylitis of right elbow 03/02/2022   Hyperlipidemia 12/30/2020   Alpha thalassaemia minor 05/25/2020   Healthcare maintenance 05/25/2020   Acute gout due to renal impairment involving toe 05/20/2019   Neuropathy 05/18/2019   Primary localized osteoarthritis of left hip 11/18/2018   Primary osteoarthritis of left hip 11/18/2018   Microcytic anemia 08/13/2018   Disc disease, degenerative, cervical 11/01/2017   Liver fibrosis 04/30/2017   NSAID long-term use 01/09/2016   Chronic back pain secondary to DJD of cervical, thoracic and lumbar spine and disc herniation 01/09/2016   Osteoarthritis of both knees 11/02/2011   DISORDER, DEPRESSIVE NEC 09/25/2006   Chronic hepatitis C without hepatic coma (HCC) 02/22/2006   Essential hypertension 02/22/2006    PCP: Grayce Sessions, NP  REFERRING PROVIDER: Trena Platt, DO   REFERRING DIAG:  Diagnosis  M96.1 (ICD-10-CM) - Postlaminectomy syndrome, not elsewhere classified    Rationale for Evaluation and  Treatment: Rehabilitation  THERAPY DIAG:  Other low back pain  Muscle weakness (generalized)  Other abnormalities of gait and mobility  Greater trochanteric bursitis of both hips  ONSET DATE: chronic   SUBJECTIVE:                                                                                                                                                                                           SUBJECTIVE STATEMENT: I am a 4/10 today after traction yesterday.  I did not feel all the way weightless antigravity I did before on traction but I was able to sleep better. I  was able to do dishes and I could  do it more comfortably and walking through the parking lot I did not feel my back with pain like I normally do.  I was able to bend all the way down to the ground.  EVAL: "I hurt all over. I had 2 neck surgeries, a back surgery, and hip surgery." He has completed PT following each surgical intervention stating this has been helpful in reducing his pain. He reports aching all over especially when he first wakes in the morning. He localizes his back pain to the center of his low back and can shoot down the posterior thighs with prolonged standing. He reports chronic pain that has been ongoing for 20+ years attributed to his work at the [employer]. He was recently seen in pain management and was prescribed pain medication and muscle relaxers, which have been somewhat helpful in reducing his pain. He reports neuropathy in bilateral feet Lt>Rt and thinks it could be due to his hip surgery. He denies any changes in bowel/bladder.   PAIN:  Are you having pain? Yes: NPRS scale: 7 (at worst 10)/10 Pain location: middle of low back Pain description: ache,sharp Aggravating factors: first thing in AM, transferring from sit to stand, lifting,twisting, prolonged standing  Relieving factors: medication, PT previously  PERTINENT HISTORY:  Lumbar fusion 2017 Lt THA 2020  PRECAUTIONS: None  WEIGHT BEARING RESTRICTIONS: No  FALLS:  Has patient fallen in last 6 months? No  LIVING ENVIRONMENT: Lives with: lives with their spouse Lives in: House/apartment Stairs: Yes: Internal: 16 steps; on right going up Has following equipment at home: Single point cane  OCCUPATION: retired   PLOF: Independent  PATIENT GOALS: "get a little more flexible and help ease the pain."   OBJECTIVE:   DIAGNOSTIC FINDINGS:  Lumbar X-ray: FINDINGS: No compression deformities. No spondylolisthesis. No motion with flexion and extension to suggest instability. Postop changes L4-5 fusion and discectomy. L5-S1 disc space  narrowing with grade 1 L5 retrolisthesis. Endplate sclerosis. Lower  thoracic degenerative changes identified as well.   IMPRESSION: Postop and degenerative changes.  PATIENT SURVEYS:  FOTO 38% function to 47% predicted   SCREENING FOR RED FLAGS: Bowel or bladder incontinence: No Cauda equina syndrome: No   COGNITION: Overall cognitive status: Within functional limits for tasks assessed     SENSATION: WFL  MUSCLE LENGTH: Hamstrings: moderate tightness bilaterally   POSTURE: decreased lumbar lordosis  PALPATION: TTP bilateral lumbar paraspinals; Lt gluteals;  Pain before restriction PAIVM L-spine  LUMBAR ROM:   AROM eval  Flexion Full LBP  Extension 50% limited pain in buttocks  Right lateral flexion 50% limited  Left lateral flexion 50% limited   Right rotation 25% limited  Left rotation 25% limited    (Blank rows = not tested)  LOWER EXTREMITY ROM:     Active  Right eval Left eval  Hip flexion 99 90  Hip extension    Hip abduction    Hip adduction    Hip internal rotation    Hip external rotation    Knee flexion    Knee extension    Ankle dorsiflexion    Ankle plantarflexion    Ankle inversion    Ankle eversion     (Blank rows = not tested)  LOWER EXTREMITY MMT:    MMT Right eval Left eval  Hip flexion 4 4  Hip extension 4 4  Hip abduction 4- 4-  Hip adduction    Hip internal rotation    Hip external rotation    Knee flexion 5 5  Knee extension 5 5  Ankle dorsiflexion 5 5  Ankle plantarflexion 5 5  Ankle inversion    Ankle eversion 5 5   (Blank rows = not tested)  LUMBAR SPECIAL TESTS:  (-) SLR  FUNCTIONAL TESTS:  5 x STS: 16 seconds  : 1,013 ft   GAIT: Distance walked: 6 MWT Assistive device utilized: None Level of assistance: Complete Independence Comments: antalgic Lt; Rt foot in ER during stance, decreased step length  [facility] Adult PT Treatment:                                                DATE: 11-11-22 Pt enters  building ambulating independently. Treatment took place in water 3.8 to  4 ft 8 in.feet deep depending upon activity.  Pt entered and exited the pool via stair and handrails independently. Pt initiated Rx with 7/10 pain in  low back   Acclimating to water by walking back and forth and side stepping  without use of aquatic flotation Mr. Dalton  was educated on  beneficial therapeutic effects of water while ambulating to acclimate to water walking forward, backward and side stepping.  Pt educated on neutral posture and hip hinging in seated position with water at chest level x 10 with stretch to low back and then x 10 with back at pool wall at external cue, VC for neck tucked to prevent hyperextension.  Aquatic Exercise:   Pt hip hinge using pool noodle and then gentle lumbar rotataion Cat/Camel using yellow pool noodle Runners stretch bottom step x30" BIL Hamstring stretch bottom step x30" BIL Heel/ toe raises BIL   Hip circles CW/CCW x10 BIL   Gastroc stretch of foot on pool wall 30 " x 2 on R and L Lunge ot target 10 x R and L Squats x 20  Hip abduction/adduction x10 BIL Hip ext/flex with knee straight R x 20       Pt requires the buoyancy of water for active assisted exercises with buoyancy supported for strengthening and AROM exercises. Hydrostatic pressure also supports joints by unweighting joint load by at least 50 % in 3-4 feet depth water. 80% in chest to neck deep water. Water will provide assistance with movement using the current and laminar flow while the buoyancy reduces weight bearing. Pt requires the viscosity of the water for resistance endurance  with strengthening exercises.     [facility] Adult PT Treatment:                                                DATE: 12/11/22 Therapeutic Exercise: Nustep L6 UE/LE x 5 minutes  Seated Hamstring Stretch  x2 30 sec  Seated forward fexion stretch  x2 30 LTR x5 5" PPT x10 3" 90/90 abd bracing x5 10" Bridge c PPT x15 5" H/L Clams x15  5"BluTB STS x10 5xSTS 13"  [facility] Adult PT Treatment:                                                DATE: 12/03/22 Therapeutic Exercise: Nustep L6 UE/LE x 5 minutes  Bridge 5 sec x 10 Bridge with ball squeeze  5 sec x 10  Bridge with blue clam 5 sec x 10 SKTC x 2 each  LTR x 10 PPT 5 sec x 15  Seated lumbar flexion with laterals  [facility] Adult PT Treatment:                                                DATE: 8/13/245 Therapeutic Exercise: Nu Step 5 mins L5 UE/LE Supine Lower Trunk Rotation 10 reps Seated Hamstring Stretch  x2 30 sec  hold Hooklying Clamshell with GTB 10 reps HL hip add sets x10 PPT x10 Updated HEP  PATIENT EDUCATION:  Education details: see treatment Person educated: Patient Education method: Explanation, Demonstration, Tactile cues, Verbal cues, and Handouts Education comprehension: verbalized understanding, returned demonstration, verbal cues required, tactile cues required, and needs further education  HOME EXERCISE PROGRAM: Access Code: [email or web address] URL: [email or web address] Date: 11/28/2022 Prepared by: Joellyn Rued  Exercises - Supine Lower Trunk Rotation  - 1 x daily - 7 x weekly - 2 sets - 10 reps - Seated Hamstring Stretch  - 1 x daily - 7 x weekly - 3 sets - 30 sec  hold - Hooklying Clamshell with Resistance  - 1 x daily - 7 x weekly - 2 sets - 10 reps - Supine Hip Adduction Isometric with Ball  - 1 x daily - 7 x weekly - 2 sets - 10 reps - 3 hold - Supine Posterior Pelvic Tilt  - 1 x daily - 7 x weekly - 2 sets - 10 reps - 3 hold - Supine Bridge  - 1 x daily - 7 x weekly - 2 sets - 10 reps - 3 hold  ASSESSMENT:  CLINICAL IMPRESSION: Session today focused on education  of aquatic therapy and benefits and therapeutic benefit  of water.  Session also concentrated on  low back, hip and core strength in the aquatic environment for use of buoyancy to offload joints and the viscosity of water as resistance during therapeutic exercise.  Mr.  Cooper began with 7/10 pain.and ended with 5/10. Patient was able to tolerate all prescribed exercises in the aquatic environment with no adverse effects  Patient continues to benefit from skilled PT services on land and aquatic based and should be progressed as able to improve functional independence    EVAL: Patient is a 67 y.o. male who was seen today for physical therapy evaluation and treatment for chronic low back pain that he attributes to overuse from his job with the [place] [employer]. He has undergone multiple surgical procedures including lumbar fusion and Lt THA, but continues to report ongoing high pain levels that have somewhat improved recently with medication. Upon assessment he is noted to have limited and painful trunk AROM, hip weakness, postural abnormalities, and scores below age-related normative values on the 6 minute walk test. He will benefit from skilled PT to address the above stated deficits in order to optimize his function and assist in overall pain reduction.   OBJECTIVE IMPAIRMENTS: Abnormal gait, decreased activity tolerance, decreased endurance, decreased knowledge of condition, decreased mobility, difficulty walking, decreased ROM, decreased strength, increased fascial restrictions, impaired flexibility, improper body mechanics, postural dysfunction, and pain.   ACTIVITY LIMITATIONS: carrying, lifting, bending, standing, squatting, transfers, and locomotion level  PARTICIPATION LIMITATIONS: meal prep, cleaning, laundry, shopping, community activity, and yard work  PERSONAL FACTORS: Age, Fitness, Time since onset of injury/illness/exacerbation, and 3+ comorbidities: see PMH/PSH above  are also affecting patient's functional outcome.   REHAB POTENTIAL: Fair see personal factors  CLINICAL DECISION MAKING: Evolving/moderate complexity  EVALUATION COMPLEXITY: Moderate   GOALS: Goals reviewed with patient? Yes  SHORT TERM GOALS: Target date: 12/11/2022    Patient  will be independent and compliant with initial HEP.   Baseline: see above Goal status: MET- pt is completing his HEP correctly  2.  Patient will complete 5 x STS in </=13 seconds to improve ease of transfers.  Baseline: see above   12/11/22: 13" Goal status: MET  3.  Patient will demonstrate proper lifting mechanics without an increase in back pain.  Baseline: increased pain  Goal status: INITIAL   LONG TERM GOALS: Target date: 01/12/2023    Patient will score at least 47% on FOTO to signify clinically meaningful improvement in functional abilities.   Baseline: see above Goal status: INITIAL  2.  Patient will walk at least 1200 ft during 6 MWT to improve ability to navigate in the community.  Baseline: see above Goal status: INITIAL  3.  Patient will demonstrate 4+/5 bilateral hip strength to improve stability about the chain with prolonged walking/standing activity.  Baseline: see above Goal status: INITIAL  4.  Patient will be independent with advanced home program to assist in management of his chronic condition.  Baseline: see above Goal status: INITIAL    PLAN:  PT FREQUENCY: 1-2x/week  PT DURATION: 8 weeks  PLANNED INTERVENTIONS: Therapeutic exercises, Therapeutic activity, Neuromuscular re-education, Balance training, Gait training, Patient/Family education, Self Care, Aquatic Therapy, Dry Needling, Electrical stimulation, Cryotherapy, Moist heat, Manual therapy, and Re-evaluation.  PLAN FOR NEXT SESSION: review and progress HEP prn; trunk mobility, hip stretching, core/hip strengthening, lifting mechanics.   Garen Lah, PT, ATRIC Certified Exercise Expert for the Aging Adult  12/12/22 3:09 PM Phone: [phone] Fax: [phone]

## 2022-12-12 ENCOUNTER — Ambulatory Visit: Payer: Federal, State, Local not specified - PPO | Admitting: Physical Therapy

## 2022-12-12 ENCOUNTER — Other Ambulatory Visit (INDEPENDENT_AMBULATORY_CARE_PROVIDER_SITE_OTHER): Payer: Self-pay | Admitting: Primary Care

## 2022-12-12 ENCOUNTER — Encounter: Payer: Self-pay | Admitting: Physical Therapy

## 2022-12-12 DIAGNOSIS — M5459 Other low back pain: Secondary | ICD-10-CM

## 2022-12-12 DIAGNOSIS — R2689 Other abnormalities of gait and mobility: Secondary | ICD-10-CM

## 2022-12-12 DIAGNOSIS — M6281 Muscle weakness (generalized): Secondary | ICD-10-CM

## 2022-12-12 DIAGNOSIS — M7061 Trochanteric bursitis, right hip: Secondary | ICD-10-CM

## 2022-12-12 MED ORDER — PANTOPRAZOLE SODIUM 40 MG PO TBEC: 40.0000 mg | DELAYED_RELEASE_TABLET | Freq: Every day | ORAL | 0 refills | Status: DC

## 2022-12-13 ENCOUNTER — Other Ambulatory Visit (INDEPENDENT_AMBULATORY_CARE_PROVIDER_SITE_OTHER): Payer: Self-pay

## 2022-12-13 MED ORDER — DULOXETINE HCL 30 MG PO CPEP: ORAL_CAPSULE | ORAL | 1 refills | Status: DC

## 2022-12-18 NOTE — Therapy (Signed)
OUTPATIENT PHYSICAL THERAPY THORACOLUMBAR TREATMENT NOTE   Patient Name: Greg Flowers MRN: [id number] DOB:16-Dec-1955,67 y.o., male Today's Date: 12/19/2022   END OF SESSION:  PT End of Session - 12/19/22 1419     Visit Number 6    Number of Visits 17    Date for PT Re-Evaluation 01/12/23    Authorization Type [employer]    PT Start Time 1415    PT Stop Time 1500    PT Time Calculation (min) 45 min    Activity Tolerance Patient tolerated treatment well    Behavior During Therapy [facility] for tasks assessed/performed                  Past Medical History:  Diagnosis Date   Anxiety    Calculus, kidney 2000   Sees Dr. Isabel Caprice, urology.    Cervical spondylosis 2004   sp surgery by Dr. Danielle Dess   Depression    GERD (gastroesophageal reflux disease)    Headache(784.0)    Chronic, on vicodin 5 TID for this.    Hepatitis C    Has occasional visits with Dr. Randa Evens GI, failed RX in 2000.  Liver Biopsy 9/06: Minimally active hepatitis consistent with hepatitis C, minimal necroinflamtory activity grade 1, no incrased fibrosis stage 0.    Herpes labialis    HERPES LABIALIS 04/04/2006   Qualifier: Diagnosis of  By: Aundria Rud MD, Roseanne Reno     History of kidney stones    Hypertension    Pneumonia    Renal stone    Spondylolisthesis of lumbar region    Thalassemia    HGB 9/09 14.4 wtih MCV 72.6   Viral upper respiratory illness 04/12/2021   Wears glasses    Past Surgical History:  Procedure Laterality Date   ANTERIOR CERVICAL DECOMP/DISCECTOMY FUSION N/A 11/21/2017   Procedure: ANTERIOR CERVICAL DECOMPRESSION FUSION, CERVICAL THREE-FOUR, CERVICAL FOUR-FIVE WITH INSTRUMENTATION AND ALLOGRAFT;  Surgeon: Estill Bamberg, MD;  Location: [facility] OR;  Service: Orthopedics;  Laterality: N/A;  ANTERIOR CERVICAL DECOMPRESSION FUSION, CERVICAL THREE-FOUR, CERVICAL FOUR-FIVE WITH INSTRUMENTATION AND ALLOGRAFT   BACK SURGERY  2017   L4-L5 PLATE/SCREWS   CERVICAL DISCECTOMY  2004   Dr Danielle Dess   COLONOSCOPY      LITHOTRIPSY  2004   Dr Logan Bores   NASAL SEPTUM SURGERY  2007   TONSILLECTOMY     TOTAL HIP ARTHROPLASTY Left 11/18/2018   Procedure: Left Anterior Hip Arthroplasty;  Surgeon: Marcene Corning, MD;  Location: [facility] ORS;  Service: Orthopedics;  Laterality: Left;   Patient Active Problem List   Diagnosis Date Noted   Lateral epicondylitis of right elbow 03/02/2022   Hyperlipidemia 12/30/2020   Alpha thalassaemia minor 05/25/2020   Healthcare maintenance 05/25/2020   Acute gout due to renal impairment involving toe 05/20/2019   Neuropathy 05/18/2019   Primary localized osteoarthritis of left hip 11/18/2018   Primary osteoarthritis of left hip 11/18/2018   Microcytic anemia 08/13/2018   Disc disease, degenerative, cervical 11/01/2017   Liver fibrosis 04/30/2017   NSAID long-term use 01/09/2016   Chronic back pain secondary to DJD of cervical, thoracic and lumbar spine and disc herniation 01/09/2016   Osteoarthritis of both knees 11/02/2011   DISORDER, DEPRESSIVE NEC 09/25/2006   Chronic hepatitis C without hepatic coma (HCC) 02/22/2006   Essential hypertension 02/22/2006    PCP: Grayce Sessions, NP  REFERRING PROVIDER: Trena Platt, DO   REFERRING DIAG:  Diagnosis  M96.1 (ICD-10-CM) - Postlaminectomy syndrome, not elsewhere classified    Rationale for Evaluation  and Treatment: Rehabilitation  THERAPY DIAG:  Other low back pain  Muscle weakness (generalized)  Other abnormalities of gait and mobility  Greater trochanteric bursitis of both hips  ONSET DATE: chronic   SUBJECTIVE:                                                                                                                                                                                           SUBJECTIVE STATEMENT: I am a 5/10 today in low back today.  He says he gets a lot out of aquatics and plans to join a pool  maybe even [employer].  EVAL: "I hurt all over. I had 2 neck surgeries, a back  surgery, and hip surgery." He has completed PT following each surgical intervention stating this has been helpful in reducing his pain. He reports aching all over especially when he first wakes in the morning. He localizes his back pain to the center of his low back and can shoot down the posterior thighs with prolonged standing. He reports chronic pain that has been ongoing for 20+ years attributed to his work at the [employer]. He was recently seen in pain management and was prescribed pain medication and muscle relaxers, which have been somewhat helpful in reducing his pain. He reports neuropathy in bilateral feet Lt>Rt and thinks it could be due to his hip surgery. He denies any changes in bowel/bladder.   PAIN:  Are you having pain? Yes: NPRS scale: 7 (at worst 10)/10 Pain location: middle of low back Pain description: ache,sharp Aggravating factors: first thing in AM, transferring from sit to stand, lifting,twisting, prolonged standing  Relieving factors: medication, PT previously  PERTINENT HISTORY:  Lumbar fusion 2017 Lt THA 2020  PRECAUTIONS: None  WEIGHT BEARING RESTRICTIONS: No  FALLS:  Has patient fallen in last 6 months? No  LIVING ENVIRONMENT: Lives with: lives with their spouse Lives in: House/apartment Stairs: Yes: Internal: 16 steps; on right going up Has following equipment at home: Single point cane  OCCUPATION: retired   PLOF: Independent  PATIENT GOALS: "get a little more flexible and help ease the pain."   OBJECTIVE:   DIAGNOSTIC FINDINGS:  Lumbar X-ray: FINDINGS: No compression deformities. No spondylolisthesis. No motion with flexion and extension to suggest instability. Postop changes L4-5 fusion and discectomy. L5-S1 disc space narrowing with grade 1 L5 retrolisthesis. Endplate sclerosis. Lower thoracic degenerative changes identified as well.   IMPRESSION: Postop and degenerative changes.  PATIENT SURVEYS:  FOTO 38% function to 47% predicted    SCREENING FOR RED FLAGS: Bowel or bladder incontinence: No Cauda equina syndrome: No   COGNITION: Overall cognitive status: Within  functional limits for tasks assessed     SENSATION: WFL  MUSCLE LENGTH: Hamstrings: moderate tightness bilaterally   POSTURE: decreased lumbar lordosis  PALPATION: TTP bilateral lumbar paraspinals; Lt gluteals;  Pain before restriction PAIVM L-spine  LUMBAR ROM:   AROM eval  Flexion Full LBP  Extension 50% limited pain in buttocks  Right lateral flexion 50% limited  Left lateral flexion 50% limited   Right rotation 25% limited  Left rotation 25% limited    (Blank rows = not tested)  LOWER EXTREMITY ROM:     Active  Right eval Left eval  Hip flexion 99 90  Hip extension    Hip abduction    Hip adduction    Hip internal rotation    Hip external rotation    Knee flexion    Knee extension    Ankle dorsiflexion    Ankle plantarflexion    Ankle inversion    Ankle eversion     (Blank rows = not tested)  LOWER EXTREMITY MMT:    MMT Right eval Left eval  Hip flexion 4 4  Hip extension 4 4  Hip abduction 4- 4-  Hip adduction    Hip internal rotation    Hip external rotation    Knee flexion 5 5  Knee extension 5 5  Ankle dorsiflexion 5 5  Ankle plantarflexion 5 5  Ankle inversion    Ankle eversion 5 5   (Blank rows = not tested)  LUMBAR SPECIAL TESTS:  (-) SLR  FUNCTIONAL TESTS:  5 x STS: 16 seconds  : 1,013 ft   GAIT: Distance walked: 6 MWT Assistive device utilized: None Level of assistance: Complete Independence Comments: antalgic Lt; Rt foot in ER during stance, decreased step length    [facility] Adult PT Treatment:                                                DATE: 12-19-22 Pt enters building ambulating independently. Treatment took place in water 3.8 to  4 ft 8 in.feet deep depending upon activity.  Pt entered and exited the pool via stair and handrails independently. Pt initiated Rx with 5/10 pain in  low  back   Acclimating to water by walking back and forth and side stepping  with use of aquatic flotation rainbow DB  Aquatic Exercise:   Pt hip hinge using pool noodle and then gentle lumbar rotataion Cat/Camel using yellow pool noodle Runners stretch bottom step x30" BIL Hamstring stretch bottom step x30" BIL Lunge across pool width 15 ' x 2 Noodle stomp with large yellow pool noodle Squats x 20 Hip abduction/adduction x10 BIL Hip ext/flex with knee straight R x 20     Heel/ toe raises BIL   Gastroc stretch of foot on pool wall 30 " x 2 on R and L With UE on submerged pool seat,at 90 flexion, one legged hip extension 10 x R and L  Supine abdominal hollowing with VC to keep hips up and feet up with barbells in UE submerged  with VC for hip ext. Working with decreased coordination for following exercises  Worked on prone aquatic barbell submerged for abdominal engagement Combo prone to supine with legs extended to encourage active abdominal engagement.     Pt requires the buoyancy of water for active assisted exercises with buoyancy supported for strengthening and AROM exercises.  Hydrostatic pressure also supports joints by unweighting joint load by at least 50 % in 3-4 feet depth water. 80% in chest to neck deep water. Water will provide assistance with movement using the current and laminar flow while the buoyancy reduces weight bearing. Pt requires the viscosity of the water for resistance endurance  with strengthening exercises.   [facility] Adult PT Treatment:                                                DATE: 11-11-22 Pt enters building ambulating independently. Treatment took place in water 3.8 to  4 ft 8 in.feet deep depending upon activity.  Pt entered and exited the pool via stair and handrails independently. Pt initiated Rx with 7/10 pain in  low back   Acclimating to water by walking back and forth and side stepping  without use of aquatic flotation Greg Flowers  was educated on   beneficial therapeutic effects of water while ambulating to acclimate to water walking forward, backward and side stepping.  Pt educated on neutral posture and hip hinging in seated position with water at chest level x 10 with stretch to low back and then x 10 with back at pool wall at external cue, VC for neck tucked to prevent hyperextension.  Aquatic Exercise:   Pt hip hinge using pool noodle and then gentle lumbar rotataion Cat/Camel using yellow pool noodle Runners stretch bottom step x30" BIL Hamstring stretch bottom step x30" BIL Heel/ toe raises BIL   Hip circles CW/CCW x10 BIL   Gastroc stretch of foot on pool wall 30 " x 2 on R and L Lunge ot target 10 x R and L Squats x 20 Hip abduction/adduction x10 BIL Hip ext/flex with knee straight R x 20       Pt requires the buoyancy of water for active assisted exercises with buoyancy supported for strengthening and AROM exercises. Hydrostatic pressure also supports joints by unweighting joint load by at least 50 % in 3-4 feet depth water. 80% in chest to neck deep water. Water will provide assistance with movement using the current and laminar flow while the buoyancy reduces weight bearing. Pt requires the viscosity of the water for resistance endurance  with strengthening exercises.     [facility] Adult PT Treatment:                                                DATE: 12/11/22 Therapeutic Exercise: Nustep L6 UE/LE x 5 minutes  Seated Hamstring Stretch  x2 30 sec  Seated forward fexion stretch  x2 30 LTR x5 5" PPT x10 3" 90/90 abd bracing x5 10" Bridge c PPT x15 5" H/L Clams x15 5"BluTB STS x10 5xSTS 13"  [facility] Adult PT Treatment:                                                DATE: 12/03/22 Therapeutic Exercise: Nustep L6 UE/LE x 5 minutes  Bridge 5 sec x 10 Bridge with ball squeeze  5 sec x 10  Bridge with blue clam 5 sec x 10  SKTC x 2 each  LTR x 10 PPT 5 sec x 15  Seated lumbar flexion with laterals  [facility] Adult PT  Treatment:                                                DATE: 8/13/245 Therapeutic Exercise: Nu Step 5 mins L5 UE/LE Supine Lower Trunk Rotation 10 reps Seated Hamstring Stretch  x2 30 sec  hold Hooklying Clamshell with GTB 10 reps HL hip add sets x10 PPT x10 Updated HEP  PATIENT EDUCATION:  Education details: see treatment Person educated: Patient Education method: Explanation, Demonstration, Tactile cues, Verbal cues, and Handouts Education comprehension: verbalized understanding, returned demonstration, verbal cues required, tactile cues required, and needs further education  HOME EXERCISE PROGRAM: Access Code: [facility] URL: [email or web address] Date: 11/28/2022 Prepared by: Joellyn Rued  Exercises - Supine Lower Trunk Rotation  - 1 x daily - 7 x weekly - 2 sets - 10 reps - Seated Hamstring Stretch  - 1 x daily - 7 x weekly - 3 sets - 30 sec  hold - Hooklying Clamshell with Resistance  - 1 x daily - 7 x weekly - 2 sets - 10 reps - Supine Hip Adduction Isometric with Ball  - 1 x daily - 7 x weekly - 2 sets - 10 reps - 3 hold - Supine Posterior Pelvic Tilt  - 1 x daily - 7 x weekly - 2 sets - 10 reps - 3 hold - Supine Bridge  - 1 x daily - 7 x weekly - 2 sets - 10 reps - 3 hold  ASSESSMENT:  CLINICAL IMPRESSION: Session also concentrated on  low back, hip and core strength in the aquatic environment for use of buoyancy to offload joints and the viscosity of water as resistance during therapeutic exercise.  Greg Flowers began with 5/10 pain.and ended with 4-5/10. Pt was challenged with supine/prone abdominal exercises today needing reinforcement for coordination in learning new skill and with decreased abdominal strength. Patient was able to tolerate all prescribed exercises in the aquatic environment with no adverse effects  Patient continues to benefit from skilled PT services on land and aquatic based and should be progressed as able to improve functional independence     EVAL: Patient is a 67 y.o. male who was seen today for physical therapy evaluation and treatment for chronic low back pain that he attributes to overuse from his job with the [place] [employer]. He has undergone multiple surgical procedures including lumbar fusion and Lt THA, but continues to report ongoing high pain levels that have somewhat improved recently with medication. Upon assessment he is noted to have limited and painful trunk AROM, hip weakness, postural abnormalities, and scores below age-related normative values on the 6 minute walk test. He will benefit from skilled PT to address the above stated deficits in order to optimize his function and assist in overall pain reduction.   OBJECTIVE IMPAIRMENTS: Abnormal gait, decreased activity tolerance, decreased endurance, decreased knowledge of condition, decreased mobility, difficulty walking, decreased ROM, decreased strength, increased fascial restrictions, impaired flexibility, improper body mechanics, postural dysfunction, and pain.   ACTIVITY LIMITATIONS: carrying, lifting, bending, standing, squatting, transfers, and locomotion level  PARTICIPATION LIMITATIONS: meal prep, cleaning, laundry, shopping, community activity, and yard work  PERSONAL FACTORS: Age, Fitness, Time since onset of injury/illness/exacerbation, and 3+ comorbidities: see PMH/PSH above  are also affecting patient's functional outcome.   REHAB POTENTIAL: Fair see personal factors  CLINICAL DECISION MAKING: Evolving/moderate complexity  EVALUATION COMPLEXITY: Moderate   GOALS: Goals reviewed with patient? Yes  SHORT TERM GOALS: Target date: 12/11/2022    Patient will be independent and compliant with initial HEP.   Baseline: see above Goal status: MET- pt is completing his HEP correctly  2.  Patient will complete 5 x STS in </=13 seconds to improve ease of transfers.  Baseline: see above   12/11/22: 13" Goal status: MET  3.  Patient will demonstrate  proper lifting mechanics without an increase in back pain.  Baseline: increased pain  Goal status: INITIAL   LONG TERM GOALS: Target date: 01/12/2023    Patient will score at least 47% on FOTO to signify clinically meaningful improvement in functional abilities.   Baseline: see above Goal status: INITIAL  2.  Patient will walk at least 1200 ft during 6 MWT to improve ability to navigate in the community.  Baseline: see above Goal status: INITIAL  3.  Patient will demonstrate 4+/5 bilateral hip strength to improve stability about the chain with prolonged walking/standing activity.  Baseline: see above Goal status: INITIAL  4.  Patient will be independent with advanced home program to assist in management of his chronic condition.  Baseline: see above Goal status: INITIAL    PLAN:  PT FREQUENCY: 1-2x/week  PT DURATION: 8 weeks  PLANNED INTERVENTIONS: Therapeutic exercises, Therapeutic activity, Neuromuscular re-education, Balance training, Gait training, Patient/Family education, Self Care, Aquatic Therapy, Dry Needling, Electrical stimulation, Cryotherapy, Moist heat, Manual therapy, and Re-evaluation.  PLAN FOR NEXT SESSION: review and progress HEP prn; trunk mobility, hip stretching, core/hip strengthening, lifting mechanics.   Garen Lah, PT, ATRIC Certified Exercise Expert for the Aging Adult  12/19/22 3:07 PM Phone: [phone] Fax: [phone]

## 2022-12-19 ENCOUNTER — Ambulatory Visit: Payer: Federal, State, Local not specified - PPO | Attending: Primary Care | Admitting: Physical Therapy

## 2022-12-19 ENCOUNTER — Encounter: Payer: Self-pay | Admitting: Physical Therapy

## 2022-12-19 DIAGNOSIS — M6281 Muscle weakness (generalized): Secondary | ICD-10-CM | POA: Diagnosis present

## 2022-12-19 DIAGNOSIS — M5459 Other low back pain: Secondary | ICD-10-CM | POA: Insufficient documentation

## 2022-12-19 DIAGNOSIS — R2689 Other abnormalities of gait and mobility: Secondary | ICD-10-CM | POA: Diagnosis present

## 2022-12-19 DIAGNOSIS — M7061 Trochanteric bursitis, right hip: Secondary | ICD-10-CM | POA: Insufficient documentation

## 2022-12-19 DIAGNOSIS — M7062 Trochanteric bursitis, left hip: Secondary | ICD-10-CM | POA: Insufficient documentation

## 2022-12-20 ENCOUNTER — Ambulatory Visit: Payer: Federal, State, Local not specified - PPO | Admitting: Physical Therapy

## 2022-12-20 ENCOUNTER — Encounter: Payer: Self-pay | Admitting: Physical Therapy

## 2022-12-20 DIAGNOSIS — M5459 Other low back pain: Secondary | ICD-10-CM | POA: Diagnosis not present

## 2022-12-20 DIAGNOSIS — M6281 Muscle weakness (generalized): Secondary | ICD-10-CM

## 2022-12-20 NOTE — Therapy (Signed)
OUTPATIENT PHYSICAL THERAPY THORACOLUMBAR TREATMENT NOTE   Patient Name: Greg Flowers MRN: [id number] DOB:07/05/1955,66 y.o., male Today's Date: 12/20/2022   END OF SESSION:  PT End of Session - 12/20/22 1328     Visit Number 7    Number of Visits 17    Date for PT Re-Evaluation 01/12/23    Authorization Type [employer]    PT Start Time 1322    PT Stop Time 1400    PT Time Calculation (min) 38 min                  Past Medical History:  Diagnosis Date   Anxiety    Calculus, kidney 2000   Sees Dr. Isabel Caprice, urology.    Cervical spondylosis 2004   sp surgery by Dr. Danielle Dess   Depression    GERD (gastroesophageal reflux disease)    Headache(784.0)    Chronic, on vicodin 5 TID for this.    Hepatitis C    Has occasional visits with Dr. Randa Evens GI, failed RX in 2000.  Liver Biopsy 9/06: Minimally active hepatitis consistent with hepatitis C, minimal necroinflamtory activity grade 1, no incrased fibrosis stage 0.    Herpes labialis    HERPES LABIALIS 04/04/2006   Qualifier: Diagnosis of  By: Aundria Rud MD, Roseanne Reno     History of kidney stones    Hypertension    Pneumonia    Renal stone    Spondylolisthesis of lumbar region    Thalassemia    HGB 9/09 14.4 wtih MCV 72.6   Viral upper respiratory illness 04/12/2021   Wears glasses    Past Surgical History:  Procedure Laterality Date   ANTERIOR CERVICAL DECOMP/DISCECTOMY FUSION N/A 11/21/2017   Procedure: ANTERIOR CERVICAL DECOMPRESSION FUSION, CERVICAL THREE-FOUR, CERVICAL FOUR-FIVE WITH INSTRUMENTATION AND ALLOGRAFT;  Surgeon: Estill Bamberg, MD;  Location: [facility] OR;  Service: Orthopedics;  Laterality: N/A;  ANTERIOR CERVICAL DECOMPRESSION FUSION, CERVICAL THREE-FOUR, CERVICAL FOUR-FIVE WITH INSTRUMENTATION AND ALLOGRAFT   BACK SURGERY  2017   L4-L5 PLATE/SCREWS   CERVICAL DISCECTOMY  2004   Dr Danielle Dess   COLONOSCOPY     LITHOTRIPSY  2004   Dr Logan Bores   NASAL SEPTUM SURGERY  2007   TONSILLECTOMY     TOTAL HIP ARTHROPLASTY Left  11/18/2018   Procedure: Left Anterior Hip Arthroplasty;  Surgeon: Marcene Corning, MD;  Location: [facility];  Service: Orthopedics;  Laterality: Left;   Patient Active Problem List   Diagnosis Date Noted   Lateral epicondylitis of right elbow 03/02/2022   Hyperlipidemia 12/30/2020   Alpha thalassaemia minor 05/25/2020   Healthcare maintenance 05/25/2020   Acute gout due to renal impairment involving toe 05/20/2019   Neuropathy 05/18/2019   Primary localized osteoarthritis of left hip 11/18/2018   Primary osteoarthritis of left hip 11/18/2018   Microcytic anemia 08/13/2018   Disc disease, degenerative, cervical 11/01/2017   Liver fibrosis 04/30/2017   NSAID long-term use 01/09/2016   Chronic back pain secondary to DJD of cervical, thoracic and lumbar spine and disc herniation 01/09/2016   Osteoarthritis of both knees 11/02/2011   DISORDER, DEPRESSIVE NEC 09/25/2006   Chronic hepatitis C without hepatic coma (HCC) 02/22/2006   Essential hypertension 02/22/2006    PCP: Grayce Sessions, NP  REFERRING PROVIDER: Trena Platt, DO   REFERRING DIAG:  Diagnosis  M96.1 (ICD-10-CM) - Postlaminectomy syndrome, not elsewhere classified    Rationale for Evaluation and Treatment: Rehabilitation  THERAPY DIAG:  Other low back pain  Muscle weakness (generalized)  ONSET DATE: chronic  SUBJECTIVE:                                                                                                                                                                                           SUBJECTIVE STATEMENT: I am Very sore from the pool. My abdominals are weak. I want to check into doing more aquatics.   EVAL: "I hurt all over. I had 2 neck surgeries, a back surgery, and hip surgery." He has completed PT following each surgical intervention stating this has been helpful in reducing his pain. He reports aching all over especially when he first wakes in the morning. He localizes his back pain  to the center of his low back and can shoot down the posterior thighs with prolonged standing. He reports chronic pain that has been ongoing for 20+ years attributed to his work at the [employer]. He was recently seen in pain management and was prescribed pain medication and muscle relaxers, which have been somewhat helpful in reducing his pain. He reports neuropathy in bilateral feet Lt>Rt and thinks it could be due to his hip surgery. He denies any changes in bowel/bladder.   PAIN:  Are you having pain? Yes: NPRS scale: 7 (at worst 10)/10 Pain location: middle of low back Pain description: ache,sharp Aggravating factors: first thing in AM, transferring from sit to stand, lifting,twisting, prolonged standing  Relieving factors: medication, PT previously  PERTINENT HISTORY:  Lumbar fusion 2017 Lt THA 2020  PRECAUTIONS: None  WEIGHT BEARING RESTRICTIONS: No  FALLS:  Has patient fallen in last 6 months? No  LIVING ENVIRONMENT: Lives with: lives with their spouse Lives in: House/apartment Stairs: Yes: Internal: 16 steps; on right going up Has following equipment at home: Single point cane  OCCUPATION: retired   PLOF: Independent  PATIENT GOALS: "get a little more flexible and help ease the pain."   OBJECTIVE:   DIAGNOSTIC FINDINGS:  Lumbar X-ray: FINDINGS: No compression deformities. No spondylolisthesis. No motion with flexion and extension to suggest instability. Postop changes L4-5 fusion and discectomy. L5-S1 disc space narrowing with grade 1 L5 retrolisthesis. Endplate sclerosis. Lower thoracic degenerative changes identified as well.   IMPRESSION: Postop and degenerative changes.  PATIENT SURVEYS:  FOTO 38% function to 47% predicted  FOTO 53%  SCREENING FOR RED FLAGS: Bowel or bladder incontinence: No Cauda equina syndrome: No   COGNITION: Overall cognitive status: Within functional limits for tasks assessed     SENSATION: WFL  MUSCLE  LENGTH: Hamstrings: moderate tightness bilaterally   POSTURE: decreased lumbar lordosis  PALPATION: TTP bilateral lumbar paraspinals; Lt gluteals;  Pain before restriction PAIVM L-spine  LUMBAR ROM:  AROM eval  Flexion Full LBP  Extension 50% limited pain in buttocks  Right lateral flexion 50% limited  Left lateral flexion 50% limited   Right rotation 25% limited  Left rotation 25% limited    (Blank rows = not tested)  LOWER EXTREMITY ROM:     Active  Right eval Left eval  Hip flexion 99 90  Hip extension    Hip abduction    Hip adduction    Hip internal rotation    Hip external rotation    Knee flexion    Knee extension    Ankle dorsiflexion    Ankle plantarflexion    Ankle inversion    Ankle eversion     (Blank rows = not tested)  LOWER EXTREMITY MMT:    MMT Right eval Left eval  Hip flexion 4 4  Hip extension 4 4  Hip abduction 4- 4-  Hip adduction    Hip internal rotation    Hip external rotation    Knee flexion 5 5  Knee extension 5 5  Ankle dorsiflexion 5 5  Ankle plantarflexion 5 5  Ankle inversion    Ankle eversion 5 5   (Blank rows = not tested)  LUMBAR SPECIAL TESTS:  (-) SLR  FUNCTIONAL TESTS:  5 x STS: 16 seconds  : 1,013 ft   GAIT: Distance walked: 6 MWT Assistive device utilized: None Level of assistance: Complete Independence Comments: antalgic Lt; Rt foot in ER during stance, decreased step length    [facility] Adult PT Treatment:                                                DATE: 12/20/22 Therapeutic Exercise: Nustep L5 UE/LE x 5 minutes  Palloff press green band double 10 x 2 each Black band pull downs x 20 Hip abduction at wall with opposite arm abduction isometric on wall  10 x 2 each  Isometric hip flexion isometrics  x 10  Opposite isometric hip flexion isometrics  x 10 STS 10 # x 15  Therapeutic Activity: FOTO     [facility] Adult PT Treatment:                                                DATE: 12-19-22 Pt enters  building ambulating independently. Treatment took place in water 3.8 to  4 ft 8 in.feet deep depending upon activity.  Pt entered and exited the pool via stair and handrails independently. Pt initiated Rx with 5/10 pain in  low back   Acclimating to water by walking back and forth and side stepping  with use of aquatic flotation rainbow DB  Aquatic Exercise:   Pt hip hinge using pool noodle and then gentle lumbar rotataion Cat/Camel using yellow pool noodle Runners stretch bottom step x30" BIL Hamstring stretch bottom step x30" BIL Lunge across pool width 15 ' x 2 Noodle stomp with large yellow pool noodle Squats x 20 Hip abduction/adduction x10 BIL Hip ext/flex with knee straight R x 20     Heel/ toe raises BIL   Gastroc stretch of foot on pool wall 30 " x 2 on R and L With UE on submerged pool seat,at 90 flexion, one legged hip extension  10 x R and L  Supine abdominal hollowing with VC to keep hips up and feet up with barbells in UE submerged  with VC for hip ext. Working with decreased coordination for following exercises  Worked on prone aquatic barbell submerged for abdominal engagement Combo prone to supine with legs extended to encourage active abdominal engagement.     Pt requires the buoyancy of water for active assisted exercises with buoyancy supported for strengthening and AROM exercises. Hydrostatic pressure also supports joints by unweighting joint load by at least 50 % in 3-4 feet depth water. 80% in chest to neck deep water. Water will provide assistance with movement using the current and laminar flow while the buoyancy reduces weight bearing. Pt requires the viscosity of the water for resistance endurance  with strengthening exercises.   [facility] Adult PT Treatment:                                                DATE: 12-12-22 Pt enters building ambulating independently. Treatment took place in water 3.8 to  4 ft 8 in.feet deep depending upon activity.  Pt entered and exited  the pool via stair and handrails independently. Pt initiated Rx with 7/10 pain in  low back   Acclimating to water by walking back and forth and side stepping  without use of aquatic flotation Mr. Natan  was educated on  beneficial therapeutic effects of water while ambulating to acclimate to water walking forward, backward and side stepping.  Pt educated on neutral posture and hip hinging in seated position with water at chest level x 10 with stretch to low back and then x 10 with back at pool wall at external cue, VC for neck tucked to prevent hyperextension.  Aquatic Exercise:   Pt hip hinge using pool noodle and then gentle lumbar rotataion Cat/Camel using yellow pool noodle Runners stretch bottom step x30" BIL Hamstring stretch bottom step x30" BIL Heel/ toe raises BIL   Hip circles CW/CCW x10 BIL   Gastroc stretch of foot on pool wall 30 " x 2 on R and L Lunge ot target 10 x R and L Squats x 20 Hip abduction/adduction x10 BIL Hip ext/flex with knee straight R x 20       Pt requires the buoyancy of water for active assisted exercises with buoyancy supported for strengthening and AROM exercises. Hydrostatic pressure also supports joints by unweighting joint load by at least 50 % in 3-4 feet depth water. 80% in chest to neck deep water. Water will provide assistance with movement using the current and laminar flow while the buoyancy reduces weight bearing. Pt requires the viscosity of the water for resistance endurance  with strengthening exercises.     [facility] Adult PT Treatment:                                                DATE: 12/11/22 Therapeutic Exercise: Nustep L6 UE/LE x 5 minutes  Seated Hamstring Stretch  x2 30 sec  Seated forward fexion stretch  x2 30 LTR x5 5" PPT x10 3" 90/90 abd bracing x5 10" Bridge c PPT x15 5" H/L Clams x15 5"BluTB STS x10 5xSTS 13"  [facility] Adult PT Treatment:  DATE: 12/03/22 Therapeutic  Exercise: Nustep L6 UE/LE x 5 minutes  Bridge 5 sec x 10 Bridge with ball squeeze  5 sec x 10  Bridge with blue clam 5 sec x 10 SKTC x 2 each  LTR x 10 PPT 5 sec x 15  Seated lumbar flexion with laterals  [facility] Adult PT Treatment:                                                DATE: 8/13/245 Therapeutic Exercise: Nu Step 5 mins L5 UE/LE Supine Lower Trunk Rotation 10 reps Seated Hamstring Stretch  x2 30 sec  hold Hooklying Clamshell with GTB 10 reps HL hip add sets x10 PPT x10 Updated HEP  PATIENT EDUCATION:  Education details: see treatment Person educated: Patient Education method: Explanation, Demonstration, Tactile cues, Verbal cues, and Handouts Education comprehension: verbalized understanding, returned demonstration, verbal cues required, tactile cues required, and needs further education  HOME EXERCISE PROGRAM: Access Code: [facility] URL: [email or web address] Date: 11/28/2022 Prepared by: Joellyn Rued  Exercises - Supine Lower Trunk Rotation  - 1 x daily - 7 x weekly - 2 sets - 10 reps - Seated Hamstring Stretch  - 1 x daily - 7 x weekly - 3 sets - 30 sec  hold - Hooklying Clamshell with Resistance  - 1 x daily - 7 x weekly - 2 sets - 10 reps - Supine Hip Adduction Isometric with Ball  - 1 x daily - 7 x weekly - 2 sets - 10 reps - 3 hold - Supine Posterior Pelvic Tilt  - 1 x daily - 7 x weekly - 2 sets - 10 reps - 3 hold - Supine Bridge  - 1 x daily - 7 x weekly - 2 sets - 10 reps - 3 hold  ASSESSMENT:  CLINICAL IMPRESSION: Pt reports 7/10 pain/soreness after aquatics yesterday. He reports positive response to aquatics overall. Continued with Core and hip strength focus today.   Session also concentrated on  low back, hip and core strength in the aquatic environment for use of buoyancy to offload joints and the viscosity of water as resistance during therapeutic exercise.  Mr. Deloyd began with 5/10 pain.and ended with 4-5/10. Pt was challenged with  supine/prone abdominal exercises today needing reinforcement for coordination in learning new skill and with decreased abdominal strength. Patient was able to tolerate all prescribed exercises in the aquatic environment with no adverse effects  Patient continues to benefit from skilled PT services on land and aquatic based and should be progressed as able to improve functional independence    EVAL: Patient is a 68 y.o. male who was seen today for physical therapy evaluation and treatment for chronic low back pain that he attributes to overuse from his job with the [place] [employer]. He has undergone multiple surgical procedures including lumbar fusion and Lt THA, but continues to report ongoing high pain levels that have somewhat improved recently with medication. Upon assessment he is noted to have limited and painful trunk AROM, hip weakness, postural abnormalities, and scores below age-related normative values on the 6 minute walk test. He will benefit from skilled PT to address the above stated deficits in order to optimize his function and assist in overall pain reduction.   OBJECTIVE IMPAIRMENTS: Abnormal gait, decreased activity tolerance, decreased endurance, decreased knowledge of condition, decreased mobility, difficulty walking, decreased  ROM, decreased strength, increased fascial restrictions, impaired flexibility, improper body mechanics, postural dysfunction, and pain.   ACTIVITY LIMITATIONS: carrying, lifting, bending, standing, squatting, transfers, and locomotion level  PARTICIPATION LIMITATIONS: meal prep, cleaning, laundry, shopping, community activity, and yard work  PERSONAL FACTORS: Age, Fitness, Time since onset of injury/illness/exacerbation, and 3+ comorbidities: see PMH/PSH above  are also affecting patient's functional outcome.   REHAB POTENTIAL: Fair see personal factors  CLINICAL DECISION MAKING: Evolving/moderate complexity  EVALUATION COMPLEXITY:  Moderate   GOALS: Goals reviewed with patient? Yes  SHORT TERM GOALS: Target date: 12/11/2022    Patient will be independent and compliant with initial HEP.   Baseline: see above Goal status: MET- pt is completing his HEP correctly  2.  Patient will complete 5 x STS in </=13 seconds to improve ease of transfers.  Baseline: see above   12/11/22: 13" Goal status: MET  3.  Patient will demonstrate proper lifting mechanics without an increase in back pain.  Baseline: increased pain  Goal status: INITIAL   LONG TERM GOALS: Target date: 01/12/2023    Patient will score at least 47% on FOTO to signify clinically meaningful improvement in functional abilities.   Baseline: see above Goal status: INITIAL  2.  Patient will walk at least 1200 ft during 6 MWT to improve ability to navigate in the community.  Baseline: see above Goal status: INITIAL  3.  Patient will demonstrate 4+/5 bilateral hip strength to improve stability about the chain with prolonged walking/standing activity.  Baseline: see above Goal status: INITIAL  4.  Patient will be independent with advanced home program to assist in management of his chronic condition.  Baseline: see above Goal status: INITIAL    PLAN:  PT FREQUENCY: 1-2x/week  PT DURATION: 8 weeks  PLANNED INTERVENTIONS: Therapeutic exercises, Therapeutic activity, Neuromuscular re-education, Balance training, Gait training, Patient/Family education, Self Care, Aquatic Therapy, Dry Needling, Electrical stimulation, Cryotherapy, Moist heat, Manual therapy, and Re-evaluation.  PLAN FOR NEXT SESSION: review and progress HEP prn; trunk mobility, hip stretching, core/hip strengthening, lifting mechanics.   Jannette Spanner, PTA 12/20/22 3:39 PM Phone: [phone] Fax: [phone]

## 2022-12-25 ENCOUNTER — Encounter: Payer: Self-pay | Admitting: Physical Therapy

## 2022-12-25 ENCOUNTER — Ambulatory Visit: Payer: Federal, State, Local not specified - PPO | Admitting: Physical Therapy

## 2022-12-25 DIAGNOSIS — M6281 Muscle weakness (generalized): Secondary | ICD-10-CM

## 2022-12-25 DIAGNOSIS — R2689 Other abnormalities of gait and mobility: Secondary | ICD-10-CM

## 2022-12-25 DIAGNOSIS — M5459 Other low back pain: Secondary | ICD-10-CM | POA: Diagnosis not present

## 2022-12-25 NOTE — Therapy (Signed)
OUTPATIENT PHYSICAL THERAPY THORACOLUMBAR TREATMENT NOTE   Patient Name: Greg Flowers MRN: [id number] DOB:1956/02/22,66 y.o., male Today's Date: 12/25/2022   END OF SESSION:  PT End of Session - 12/25/22 1323     Visit Number 8    Number of Visits 17    Date for PT Re-Evaluation 01/12/23    Authorization Type BC[employer]    PT Start Time 1324   late check in   PT Stop Time 1358    PT Time Calculation (min) 34 min                   Past Medical History:  Diagnosis Date   Anxiety    Calculus, kidney 2000   Sees Dr. Isabel Caprice, urology.    Cervical spondylosis 2004   sp surgery by Dr. Danielle Dess   Depression    GERD (gastroesophageal reflux disease)    Headache(784.0)    Chronic, on vicodin 5 TID for this.    Hepatitis C    Has occasional visits with Dr. Randa Evens GI, failed RX in 2000.  Liver Biopsy 9/06: Minimally active hepatitis consistent with hepatitis C, minimal necroinflamtory activity grade 1, no incrased fibrosis stage 0.    Herpes labialis    HERPES LABIALIS 04/04/2006   Qualifier: Diagnosis of  By: Aundria Rud MD, Roseanne Reno     History of kidney stones    Hypertension    Pneumonia    Renal stone    Spondylolisthesis of lumbar region    Thalassemia    HGB 9/09 14.4 wtih MCV 72.6   Viral upper respiratory illness 04/12/2021   Wears glasses    Past Surgical History:  Procedure Laterality Date   ANTERIOR CERVICAL DECOMP/DISCECTOMY FUSION N/A 11/21/2017   Procedure: ANTERIOR CERVICAL DECOMPRESSION FUSION, CERVICAL THREE-FOUR, CERVICAL FOUR-FIVE WITH INSTRUMENTATION AND ALLOGRAFT;  Surgeon: Estill Bamberg, MD;  Location: [facility] OR;  Service: Orthopedics;  Laterality: N/A;  ANTERIOR CERVICAL DECOMPRESSION FUSION, CERVICAL THREE-FOUR, CERVICAL FOUR-FIVE WITH INSTRUMENTATION AND ALLOGRAFT   BACK SURGERY  2017   L4-L5 PLATE/SCREWS   CERVICAL DISCECTOMY  2004   Dr Danielle Dess   COLONOSCOPY     LITHOTRIPSY  2004   Dr Logan Bores   NASAL SEPTUM SURGERY  2007   TONSILLECTOMY     TOTAL HIP  ARTHROPLASTY Left 11/18/2018   Procedure: Left Anterior Hip Arthroplasty;  Surgeon: Marcene Corning, MD;  Location: [facility];  Service: Orthopedics;  Laterality: Left;   Patient Active Problem List   Diagnosis Date Noted   Lateral epicondylitis of right elbow 03/02/2022   Hyperlipidemia 12/30/2020   Alpha thalassaemia minor 05/25/2020   Healthcare maintenance 05/25/2020   Acute gout due to renal impairment involving toe 05/20/2019   Neuropathy 05/18/2019   Primary localized osteoarthritis of left hip 11/18/2018   Primary osteoarthritis of left hip 11/18/2018   Microcytic anemia 08/13/2018   Disc disease, degenerative, cervical 11/01/2017   Liver fibrosis 04/30/2017   NSAID long-term use 01/09/2016   Chronic back pain secondary to DJD of cervical, thoracic and lumbar spine and disc herniation 01/09/2016   Osteoarthritis of both knees 11/02/2011   DISORDER, DEPRESSIVE NEC 09/25/2006   Chronic hepatitis C without hepatic coma (HCC) 02/22/2006   Essential hypertension 02/22/2006    PCP: Grayce Sessions, NP  REFERRING PROVIDER: Trena Platt, DO   REFERRING DIAG:  Diagnosis  M96.1 (ICD-10-CM) - Postlaminectomy syndrome, not elsewhere classified    Rationale for Evaluation and Treatment: Rehabilitation  THERAPY DIAG:  Other low back pain  Muscle weakness (  generalized)  Other abnormalities of gait and mobility  ONSET DATE: chronic   SUBJECTIVE:                                                                                                                                                                                           SUBJECTIVE STATEMENT: 12/25/2022 Pt states he was pretty tired after last session, sore in his abs from working out. 6/10 pain today. Did some yardwork over the weekend.   EVAL: "I hurt all over. I had 2 neck surgeries, a back surgery, and hip surgery." He has completed PT following each surgical intervention stating this has been helpful in  reducing his pain. He reports aching all over especially when he first wakes in the morning. He localizes his back pain to the center of his low back and can shoot down the posterior thighs with prolonged standing. He reports chronic pain that has been ongoing for 20+ years attributed to his work at the [employer]. He was recently seen in pain management and was prescribed pain medication and muscle relaxers, which have been somewhat helpful in reducing his pain. He reports neuropathy in bilateral feet Lt>Rt and thinks it could be due to his hip surgery. He denies any changes in bowel/bladder.   PAIN:  Are you having pain? Yes: NPRS scale: 7 (at worst 10)/10 Pain location: middle of low back Pain description: ache,sharp Aggravating factors: first thing in AM, transferring from sit to stand, lifting,twisting, prolonged standing  Relieving factors: medication, PT previously  PERTINENT HISTORY:  Lumbar fusion 2017 Lt THA 2020  PRECAUTIONS: None  WEIGHT BEARING RESTRICTIONS: No  FALLS:  Has patient fallen in last 6 months? No  LIVING ENVIRONMENT: Lives with: lives with their spouse Lives in: House/apartment Stairs: Yes: Internal: 16 steps; on right going up Has following equipment at home: Single point cane  OCCUPATION: retired   PLOF: Independent  PATIENT GOALS: "get a little more flexible and help ease the pain."   OBJECTIVE: (objective measures completed at initial evaluation unless otherwise dated)   DIAGNOSTIC FINDINGS:  Lumbar X-ray: FINDINGS: No compression deformities. No spondylolisthesis. No motion with flexion and extension to suggest instability. Postop changes L4-5 fusion and discectomy. L5-S1 disc space narrowing with grade 1 L5 retrolisthesis. Endplate sclerosis. Lower thoracic degenerative changes identified as well.   IMPRESSION: Postop and degenerative changes.  PATIENT SURVEYS:  FOTO 38% function to 47% predicted  FOTO 53%  SCREENING FOR RED  FLAGS: Bowel or bladder incontinence: No Cauda equina syndrome: No   COGNITION: Overall cognitive status: Within functional limits for tasks assessed     SENSATION: [facility]  MUSCLE LENGTH: Hamstrings: moderate tightness bilaterally   POSTURE: decreased lumbar lordosis  PALPATION: TTP bilateral lumbar paraspinals; Lt gluteals;  Pain before restriction PAIVM L-spine  LUMBAR ROM:   AROM eval  Flexion Full LBP  Extension 50% limited pain in buttocks  Right lateral flexion 50% limited  Left lateral flexion 50% limited   Right rotation 25% limited  Left rotation 25% limited    (Blank rows = not tested)  LOWER EXTREMITY ROM:     Active  Right eval Left eval  Hip flexion 99 90  Hip extension    Hip abduction    Hip adduction    Hip internal rotation    Hip external rotation    Knee flexion    Knee extension    Ankle dorsiflexion    Ankle plantarflexion    Ankle inversion    Ankle eversion     (Blank rows = not tested)  LOWER EXTREMITY MMT:    MMT Right eval Left eval  Hip flexion 4 4  Hip extension 4 4  Hip abduction 4- 4-  Hip adduction    Hip internal rotation    Hip external rotation    Knee flexion 5 5  Knee extension 5 5  Ankle dorsiflexion 5 5  Ankle plantarflexion 5 5  Ankle inversion    Ankle eversion 5 5   (Blank rows = not tested)  LUMBAR SPECIAL TESTS:  (-) SLR  FUNCTIONAL TESTS:  5 x STS: 16 seconds  : 1,013 ft   GAIT: Distance walked: 6 MWT Assistive device utilized: None Level of assistance: Complete Independence Comments: antalgic Lt; Rt foot in ER during stance, decreased step length    .[facility] Adult PT Treatment:                                                DATE: 12/25/22 Therapeutic Exercise: Nu step L5 UELE during subjective  Paloff press 2x12 BIL, cues for posture STS 10# 8, 15# x5 cues for pacing Hooklying hip flexion isometrics x12 BIL cues for breath control Green band bridge x8 cues for comfortable ROM HEP  review     [facility] Adult PT Treatment:                                                DATE: 12/20/22 Therapeutic Exercise: Nustep L5 UE/LE x 5 minutes  Palloff press green band double 10 x 2 each Black band pull downs x 20 Hip abduction at wall with opposite arm abduction isometric on wall  10 x 2 each  Isometric hip flexion isometrics  x 10  Opposite isometric hip flexion isometrics  x 10 STS 10 # x 15  Therapeutic Activity: FOTO     [facility] Adult PT Treatment:                                                DATE: 12-19-22 Pt enters building ambulating independently. Treatment took place in water 3.8 to  4 ft 8 in.feet deep depending upon activity.  Pt entered and exited the pool via stair and  handrails independently. Pt initiated Rx with 5/10 pain in  low back   Acclimating to water by walking back and forth and side stepping  with use of aquatic flotation rainbow DB  Aquatic Exercise:   Pt hip hinge using pool noodle and then gentle lumbar rotataion Cat/Camel using yellow pool noodle Runners stretch bottom step x30" BIL Hamstring stretch bottom step x30" BIL Lunge across pool width 15 ' x 2 Noodle stomp with large yellow pool noodle Squats x 20 Hip abduction/adduction x10 BIL Hip ext/flex with knee straight R x 20     Heel/ toe raises BIL   Gastroc stretch of foot on pool wall 30 " x 2 on R and L With UE on submerged pool seat,at 90 flexion, one legged hip extension 10 x R and L  Supine abdominal hollowing with VC to keep hips up and feet up with barbells in UE submerged  with VC for hip ext. Working with decreased coordination for following exercises  Worked on prone aquatic barbell submerged for abdominal engagement Combo prone to supine with legs extended to encourage active abdominal engagement.     Pt requires the buoyancy of water for active assisted exercises with buoyancy supported for strengthening and AROM exercises. Hydrostatic pressure also supports joints by unweighting  joint load by at least 50 % in 3-4 feet depth water. 80% in chest to neck deep water. Water will provide assistance with movement using the current and laminar flow while the buoyancy reduces weight bearing. Pt requires the viscosity of the water for resistance endurance  with strengthening exercises.   [facility] Adult PT Treatment:                                                DATE: 12-12-22 Pt enters building ambulating independently. Treatment took place in water 3.8 to  4 ft 8 in.feet deep depending upon activity.  Pt entered and exited the pool via stair and handrails independently. Pt initiated Rx with 7/10 pain in  low back   Acclimating to water by walking back and forth and side stepping  without use of aquatic flotation Mr. Denzil  was educated on  beneficial therapeutic effects of water while ambulating to acclimate to water walking forward, backward and side stepping.  Pt educated on neutral posture and hip hinging in seated position with water at chest level x 10 with stretch to low back and then x 10 with back at pool wall at external cue, VC for neck tucked to prevent hyperextension.  Aquatic Exercise:   Pt hip hinge using pool noodle and then gentle lumbar rotataion Cat/Camel using yellow pool noodle Runners stretch bottom step x30" BIL Hamstring stretch bottom step x30" BIL Heel/ toe raises BIL   Hip circles CW/CCW x10 BIL   Gastroc stretch of foot on pool wall 30 " x 2 on R and L Lunge ot target 10 x R and L Squats x 20 Hip abduction/adduction x10 BIL Hip ext/flex with knee straight R x 20       Pt requires the buoyancy of water for active assisted exercises with buoyancy supported for strengthening and AROM exercises. Hydrostatic pressure also supports joints by unweighting joint load by at least 50 % in 3-4 feet depth water. 80% in chest to neck deep water. Water will provide assistance with movement using the current and  laminar flow while the buoyancy reduces weight bearing. Pt  requires the viscosity of the water for resistance endurance  with strengthening exercises.     [facility] Adult PT Treatment:                                                DATE: 12/11/22 Therapeutic Exercise: Nustep L6 UE/LE x 5 minutes  Seated Hamstring Stretch  x2 30 sec  Seated forward fexion stretch  x2 30 LTR x5 5" PPT x10 3" 90/90 abd bracing x5 10" Bridge c PPT x15 5" H/L Clams x15 5"BluTB STS x10 5xSTS 13"  [facility] Adult PT Treatment:                                                DATE: 12/03/22 Therapeutic Exercise: Nustep L6 UE/LE x 5 minutes  Bridge 5 sec x 10 Bridge with ball squeeze  5 sec x 10  Bridge with blue clam 5 sec x 10 SKTC x 2 each  LTR x 10 PPT 5 sec x 15  Seated lumbar flexion with laterals  [facility] Adult PT Treatment:                                                DATE: 8/13/245 Therapeutic Exercise: Nu Step 5 mins L5 UE/LE Supine Lower Trunk Rotation 10 reps Seated Hamstring Stretch  x2 30 sec  hold Hooklying Clamshell with GTB 10 reps HL hip add sets x10 PPT x10 Updated HEP  PATIENT EDUCATION:  Education details: see treatment Person educated: Patient Education method: Explanation, Demonstration, Tactile cues, Verbal cues, and Handouts Education comprehension: verbalized understanding, returned demonstration, verbal cues required, tactile cues required, and needs further education  HOME EXERCISE PROGRAM: Access Code: [facility] URL: [email or web address] Date: 11/28/2022 Prepared by: Joellyn Rued  Exercises - Supine Lower Trunk Rotation  - 1 x daily - 7 x weekly - 2 sets - 10 reps - Seated Hamstring Stretch  - 1 x daily - 7 x weekly - 3 sets - 30 sec  hold - Hooklying Clamshell with Resistance  - 1 x daily - 7 x weekly - 2 sets - 10 reps - Supine Hip Adduction Isometric with Ball  - 1 x daily - 7 x weekly - 2 sets - 10 reps - 3 hold - Supine Posterior Pelvic Tilt  - 1 x daily - 7 x weekly - 2 sets - 10 reps - 3 hold - Supine Bridge   - 1 x daily - 7 x weekly - 2 sets - 10 reps - 3 hold  ASSESSMENT:  CLINICAL IMPRESSION: 12/25/2022 Pt arrives w/ 6/10 pain, a bit of soreness after last session. Today continuing with familiar program working on core strength/endurance within pt tolerance. Does note some muscular fatigue but no increase in pain, no adverse events, reports 5/10 on departure. Cues as above. Recommend continuing along current POC in order to address relevant deficits and improve functional tolerance. Pt departs today's session in no acute distress, all voiced questions/concerns addressed appropriately from PT perspective.      EVAL: Patient is a  67 y.o. male who was seen today for physical therapy evaluation and treatment for chronic low back pain that he attributes to overuse from his job with the [place] [employer]. He has undergone multiple surgical procedures including lumbar fusion and Lt THA, but continues to report ongoing high pain levels that have somewhat improved recently with medication. Upon assessment he is noted to have limited and painful trunk AROM, hip weakness, postural abnormalities, and scores below age-related normative values on the 6 minute walk test. He will benefit from skilled PT to address the above stated deficits in order to optimize his function and assist in overall pain reduction.   OBJECTIVE IMPAIRMENTS: Abnormal gait, decreased activity tolerance, decreased endurance, decreased knowledge of condition, decreased mobility, difficulty walking, decreased ROM, decreased strength, increased fascial restrictions, impaired flexibility, improper body mechanics, postural dysfunction, and pain.   ACTIVITY LIMITATIONS: carrying, lifting, bending, standing, squatting, transfers, and locomotion level  PARTICIPATION LIMITATIONS: meal prep, cleaning, laundry, shopping, community activity, and yard work  PERSONAL FACTORS: Age, Fitness, Time since onset of injury/illness/exacerbation, and 3+  comorbidities: see PMH/PSH above  are also affecting patient's functional outcome.   REHAB POTENTIAL: Fair see personal factors  CLINICAL DECISION MAKING: Evolving/moderate complexity  EVALUATION COMPLEXITY: Moderate   GOALS: Goals reviewed with patient? Yes  SHORT TERM GOALS: Target date: 12/11/2022    Patient will be independent and compliant with initial HEP.   Baseline: see above Goal status: MET- pt is completing his HEP correctly  2.  Patient will complete 5 x STS in </=13 seconds to improve ease of transfers.  Baseline: see above   12/11/22: 13" Goal status: MET  3.  Patient will demonstrate proper lifting mechanics without an increase in back pain.  Baseline: increased pain  Goal status: INITIAL   LONG TERM GOALS: Target date: 01/12/2023    Patient will score at least 47% on FOTO to signify clinically meaningful improvement in functional abilities.   Baseline: see above Goal status: INITIAL  2.  Patient will walk at least 1200 ft during 6 MWT to improve ability to navigate in the community.  Baseline: see above Goal status: INITIAL  3.  Patient will demonstrate 4+/5 bilateral hip strength to improve stability about the chain with prolonged walking/standing activity.  Baseline: see above Goal status: INITIAL  4.  Patient will be independent with advanced home program to assist in management of his chronic condition.  Baseline: see above Goal status: INITIAL    PLAN:  PT FREQUENCY: 1-2x/week  PT DURATION: 8 weeks  PLANNED INTERVENTIONS: Therapeutic exercises, Therapeutic activity, Neuromuscular re-education, Balance training, Gait training, Patient/Family education, Self Care, Aquatic Therapy, Dry Needling, Electrical stimulation, Cryotherapy, Moist heat, Manual therapy, and Re-evaluation.  PLAN FOR NEXT SESSION: review and progress HEP prn; trunk mobility, hip stretching, core/hip strengthening, lifting mechanics.   Ashley Murrain PT,  DPT 12/25/2022 2:01 PM

## 2022-12-28 ENCOUNTER — Ambulatory Visit: Payer: Federal, State, Local not specified - PPO

## 2022-12-28 DIAGNOSIS — M6281 Muscle weakness (generalized): Secondary | ICD-10-CM

## 2022-12-28 DIAGNOSIS — R2689 Other abnormalities of gait and mobility: Secondary | ICD-10-CM

## 2022-12-28 DIAGNOSIS — M5459 Other low back pain: Secondary | ICD-10-CM

## 2022-12-28 NOTE — Therapy (Signed)
OUTPATIENT PHYSICAL THERAPY TREATMENT NOTE   Patient Name: Greg Flowers MRN: [id number] DOB:[birth year],67 y.o., male Today's Date: 12/28/2022   END OF SESSION:  PT End of Session - 12/28/22 1428     Visit Number 9    Number of Visits 17    Date for PT Re-Evaluation 01/12/23    Authorization Type [employer]    PT Start Time 1430    PT Stop Time 1510    PT Time Calculation (min) 40 min    Activity Tolerance Patient tolerated treatment well    Behavior During Therapy WFL for tasks assessed/performed             Past Medical History:  Diagnosis Date   Anxiety    Calculus, kidney 2000   Sees Dr. Isabel Caprice, urology.    Cervical spondylosis 2004   sp surgery by Dr. Danielle Dess   Depression    GERD (gastroesophageal reflux disease)    Headache(784.0)    Chronic, on vicodin 5 TID for this.    Hepatitis C    Has occasional visits with Dr. Randa Evens GI, failed RX in 2000.  Liver Biopsy 9/06: Minimally active hepatitis consistent with hepatitis C, minimal necroinflamtory activity grade 1, no incrased fibrosis stage 0.    Herpes labialis    HERPES LABIALIS 04/04/2006   Qualifier: Diagnosis of  By: Aundria Rud MD, Roseanne Reno     History of kidney stones    Hypertension    Pneumonia    Renal stone    Spondylolisthesis of lumbar region    Thalassemia    HGB 9/09 14.4 wtih MCV 72.6   Viral upper respiratory illness 04/12/2021   Wears glasses    Past Surgical History:  Procedure Laterality Date   ANTERIOR CERVICAL DECOMP/DISCECTOMY FUSION N/A 11/21/2017   Procedure: ANTERIOR CERVICAL DECOMPRESSION FUSION, CERVICAL THREE-FOUR, CERVICAL FOUR-FIVE WITH INSTRUMENTATION AND ALLOGRAFT;  Surgeon: Estill Bamberg, MD;  Location: [facility] OR;  Service: Orthopedics;  Laterality: N/A;  ANTERIOR CERVICAL DECOMPRESSION FUSION, CERVICAL THREE-FOUR, CERVICAL FOUR-FIVE WITH INSTRUMENTATION AND ALLOGRAFT   BACK SURGERY  2017   L4-L5 PLATE/SCREWS   CERVICAL DISCECTOMY  2004   Dr Danielle Dess   COLONOSCOPY     LITHOTRIPSY  2004    Dr Logan Bores   NASAL SEPTUM SURGERY  2007   TONSILLECTOMY     TOTAL HIP ARTHROPLASTY Left 11/18/2018   Procedure: Left Anterior Hip Arthroplasty;  Surgeon: Marcene Corning, MD;  Location: [facility];  Service: Orthopedics;  Laterality: Left;   Patient Active Problem List   Diagnosis Date Noted   Lateral epicondylitis of right elbow 03/02/2022   Hyperlipidemia 12/30/2020   Alpha thalassaemia minor 05/25/2020   Healthcare maintenance 05/25/2020   Acute gout due to renal impairment involving toe 05/20/2019   Neuropathy 05/18/2019   Primary localized osteoarthritis of left hip 11/18/2018   Primary osteoarthritis of left hip 11/18/2018   Microcytic anemia 08/13/2018   Disc disease, degenerative, cervical 11/01/2017   Liver fibrosis 04/30/2017   NSAID long-term use 01/09/2016   Chronic back pain secondary to DJD of cervical, thoracic and lumbar spine and disc herniation 01/09/2016   Osteoarthritis of both knees 11/02/2011   DISORDER, DEPRESSIVE NEC 09/25/2006   Chronic hepatitis C without hepatic coma (HCC) 02/22/2006   Essential hypertension 02/22/2006    PCP: Grayce Sessions, NP  REFERRING PROVIDER: Trena Platt, DO   REFERRING DIAG:  Diagnosis  M96.1 (ICD-10-CM) - Postlaminectomy syndrome, not elsewhere classified    Rationale for Evaluation and Treatment: Rehabilitation  THERAPY DIAG:  Other low back pain  Muscle weakness (generalized)  Other abnormalities of gait and mobility  ONSET DATE: chronic   SUBJECTIVE:                                                                                                                                                                                           SUBJECTIVE STATEMENT: Patient reports he had some soreness in his core after previous session.  EVAL: "I hurt all over. I had 2 neck surgeries, a back surgery, and hip surgery." He has completed PT following each surgical intervention stating this has been helpful in reducing  his pain. He reports aching all over especially when he first wakes in the morning. He localizes his back pain to the center of his low back and can shoot down the posterior thighs with prolonged standing. He reports chronic pain that has been ongoing for 20+ years attributed to his work at the [employer]. He was recently seen in pain management and was prescribed pain medication and muscle relaxers, which have been somewhat helpful in reducing his pain. He reports neuropathy in bilateral feet Lt>Rt and thinks it could be due to his hip surgery. He denies any changes in bowel/bladder.   PAIN:  Are you having pain? Yes: NPRS scale: 6 (at worst 10)/10 Pain location: middle of low back Pain description: ache,sharp Aggravating factors: first thing in AM, transferring from sit to stand, lifting,twisting, prolonged standing  Relieving factors: medication, PT previously  PERTINENT HISTORY:  Lumbar fusion 2017 Lt THA 2020  PRECAUTIONS: None  WEIGHT BEARING RESTRICTIONS: No  FALLS:  Has patient fallen in last 6 months? No  LIVING ENVIRONMENT: Lives with: lives with their spouse Lives in: House/apartment Stairs: Yes: Internal: 16 steps; on right going up Has following equipment at home: Single point cane  OCCUPATION: retired   PLOF: Independent  PATIENT GOALS: "get a little more flexible and help ease the pain."   OBJECTIVE: (objective measures completed at initial evaluation unless otherwise dated)   DIAGNOSTIC FINDINGS:  Lumbar X-ray: FINDINGS: No compression deformities. No spondylolisthesis. No motion with flexion and extension to suggest instability. Postop changes L4-5 fusion and discectomy. L5-S1 disc space narrowing with grade 1 L5 retrolisthesis. Endplate sclerosis. Lower thoracic degenerative changes identified as well.   IMPRESSION: Postop and degenerative changes.  PATIENT SURVEYS:  FOTO 38% function to 47% predicted  FOTO 53%  SCREENING FOR RED FLAGS: Bowel or  bladder incontinence: No Cauda equina syndrome: No   COGNITION: Overall cognitive status: Within functional limits for tasks assessed     SENSATION: WFL  MUSCLE LENGTH: Hamstrings: moderate tightness bilaterally  POSTURE: decreased lumbar lordosis  PALPATION: TTP bilateral lumbar paraspinals; Lt gluteals;  Pain before restriction PAIVM L-spine  LUMBAR ROM:   AROM eval  Flexion Full LBP  Extension 50% limited pain in buttocks  Right lateral flexion 50% limited  Left lateral flexion 50% limited   Right rotation 25% limited  Left rotation 25% limited    (Blank rows = not tested)  LOWER EXTREMITY ROM:     Active  Right eval Left eval  Hip flexion 99 90  Hip extension    Hip abduction    Hip adduction    Hip internal rotation    Hip external rotation    Knee flexion    Knee extension    Ankle dorsiflexion    Ankle plantarflexion    Ankle inversion    Ankle eversion     (Blank rows = not tested)  LOWER EXTREMITY MMT:    MMT Right eval Left eval  Hip flexion 4 4  Hip extension 4 4  Hip abduction 4- 4-  Hip adduction    Hip internal rotation    Hip external rotation    Knee flexion 5 5  Knee extension 5 5  Ankle dorsiflexion 5 5  Ankle plantarflexion 5 5  Ankle inversion    Ankle eversion 5 5   (Blank rows = not tested)  LUMBAR SPECIAL TESTS:  (-) SLR  FUNCTIONAL TESTS:  5 x STS: 16 seconds  : 1,013 ft   GAIT: Distance walked: 6 MWT Assistive device utilized: None Level of assistance: Complete Independence Comments: antalgic Lt; Rt foot in ER during stance, decreased step length   [facility] Adult PT Treatment:                                                DATE: 12-28-22 Pt enters building ambulating independently. Treatment took place in water 3.8 to  4 ft 8 in. deep depending upon activity.  Pt entered and exited the pool via stair and handrails independently.    Acclimating to water by walking back and forth and side stepping  with use of  aquatic flotation rainbow DB  Aquatic Exercise:   Squats x 25 Hip abduction/adduction x10 BIL Hip ext/flex with knee straight x20 BIL March to kick x20 BIL Heel/toe raises x25 Gentle lumbar rotation with noodle x1' Cat/Camel using yellow pool noodle x15 Lunge across pool width x2 laps Sidestepping with rainbow DB shoulder abd/add x2 laps With UE on submerged pool seat,at 90 flexion, one legged hip extension 10 x R and L Runners stretch bottom step x30" BIL Hamstring stretch bottom step x30" BIL Back against wall yellow DB shoulder flex/ext x20    Pt requires the buoyancy of water for active assisted exercises with buoyancy supported for strengthening and AROM exercises. Hydrostatic pressure also supports joints by unweighting joint load by at least 50 % in 3-4 feet depth water. 80% in chest to neck deep water. Water will provide assistance with movement using the current and laminar flow while the buoyancy reduces weight bearing. Pt requires the viscosity of the water for resistance endurance  with strengthening exercises.  [facility] Treatment:  DATE: 12/25/22 Therapeutic Exercise: Nu step L5 UELE during subjective  Paloff press 2x12 BIL, cues for posture STS 10# 8, 15# x5 cues for pacing Hooklying hip flexion isometrics x12 BIL cues for breath control Green band bridge x8 cues for comfortable ROM HEP review     [facility] Adult PT Treatment:                                                DATE: 12/20/22 Therapeutic Exercise: Nustep L5 UE/LE x 5 minutes  Palloff press green band double 10 x 2 each Black band pull downs x 20 Hip abduction at wall with opposite arm abduction isometric on wall  10 x 2 each  Isometric hip flexion isometrics  x 10  Opposite isometric hip flexion isometrics  x 10 STS 10 # x 15  Therapeutic Activity: FOTO      PATIENT EDUCATION:  Education details: see treatment Person educated:  Patient Education method: Explanation, Demonstration, Tactile cues, Verbal cues, and Handouts Education comprehension: verbalized understanding, returned demonstration, verbal cues required, tactile cues required, and needs further education  HOME EXERCISE PROGRAM: Access Code: [facility] URL: [email or web address] Date: 11/28/2022 Prepared by: Joellyn Rued  Exercises - Supine Lower Trunk Rotation  - 1 x daily - 7 x weekly - 2 sets - 10 reps - Seated Hamstring Stretch  - 1 x daily - 7 x weekly - 3 sets - 30 sec  hold - Hooklying Clamshell with Resistance  - 1 x daily - 7 x weekly - 2 sets - 10 reps - Supine Hip Adduction Isometric with Ball  - 1 x daily - 7 x weekly - 2 sets - 10 reps - 3 hold - Supine Posterior Pelvic Tilt  - 1 x daily - 7 x weekly - 2 sets - 10 reps - 3 hold - Supine Bridge  - 1 x daily - 7 x weekly - 2 sets - 10 reps - 3 hold  ASSESSMENT:  CLINICAL IMPRESSION: Patient presents to aquatic PT session reporting continued lower back pain that he states is worse upon waking. Session today focused on core, proximal hip and LE strengthening as well as activity tolerance in the aquatic environment for use of buoyancy to offload joints and the viscosity of water as resistance during therapeutic exercise. Patient was able to tolerate all prescribed exercises in the aquatic environment with no adverse effects. Patient continues to benefit from skilled PT services on land and aquatic based and should be progressed as able to improve functional independence.   EVAL: Patient is a 67 y.o. male who was seen today for physical therapy evaluation and treatment for chronic low back pain that he attributes to overuse from his job with the [place] [employer]. He has undergone multiple surgical procedures including lumbar fusion and Lt THA, but continues to report ongoing high pain levels that have somewhat improved recently with medication. Upon assessment he is noted to have limited  and painful trunk AROM, hip weakness, postural abnormalities, and scores below age-related normative values on the 6 minute walk test. He will benefit from skilled PT to address the above stated deficits in order to optimize his function and assist in overall pain reduction.   OBJECTIVE IMPAIRMENTS: Abnormal gait, decreased activity tolerance, decreased endurance, decreased knowledge of condition, decreased mobility, difficulty walking, decreased ROM, decreased strength, increased fascial restrictions,  impaired flexibility, improper body mechanics, postural dysfunction, and pain.   ACTIVITY LIMITATIONS: carrying, lifting, bending, standing, squatting, transfers, and locomotion level  PARTICIPATION LIMITATIONS: meal prep, cleaning, laundry, shopping, community activity, and yard work  PERSONAL FACTORS: Age, Fitness, Time since onset of injury/illness/exacerbation, and 3+ comorbidities: see PMH/PSH above  are also affecting patient's functional outcome.   REHAB POTENTIAL: Fair see personal factors  CLINICAL DECISION MAKING: Evolving/moderate complexity  EVALUATION COMPLEXITY: Moderate   GOALS: Goals reviewed with patient? Yes  SHORT TERM GOALS: Target date: 12/11/2022    Patient will be independent and compliant with initial HEP.   Baseline: see above Goal status: MET- pt is completing his HEP correctly  2.  Patient will complete 5 x STS in </=13 seconds to improve ease of transfers.  Baseline: see above   12/11/22: 13" Goal status: MET  3.  Patient will demonstrate proper lifting mechanics without an increase in back pain.  Baseline: increased pain  Goal status: INITIAL   LONG TERM GOALS: Target date: 01/12/2023    Patient will score at least 47% on FOTO to signify clinically meaningful improvement in functional abilities.   Baseline: see above Goal status: INITIAL  2.  Patient will walk at least 1200 ft during 6 MWT to improve ability to navigate in the community.   Baseline: see above Goal status: INITIAL  3.  Patient will demonstrate 4+/5 bilateral hip strength to improve stability about the chain with prolonged walking/standing activity.  Baseline: see above Goal status: INITIAL  4.  Patient will be independent with advanced home program to assist in management of his chronic condition.  Baseline: see above Goal status: INITIAL    PLAN:  PT FREQUENCY: 1-2x/week  PT DURATION: 8 weeks  PLANNED INTERVENTIONS: Therapeutic exercises, Therapeutic activity, Neuromuscular re-education, Balance training, Gait training, Patient/Family education, Self Care, Aquatic Therapy, Dry Needling, Electrical stimulation, Cryotherapy, Moist heat, Manual therapy, and Re-evaluation.  PLAN FOR NEXT SESSION: review and progress HEP prn; trunk mobility, hip stretching, core/hip strengthening, lifting mechanics.   Berta Minor PTA 12/28/2022 3:08 PM

## 2023-01-02 ENCOUNTER — Ambulatory Visit: Payer: Federal, State, Local not specified - PPO | Admitting: Physical Therapy

## 2023-01-02 ENCOUNTER — Encounter: Payer: Self-pay | Admitting: Physical Therapy

## 2023-01-02 DIAGNOSIS — M5459 Other low back pain: Secondary | ICD-10-CM | POA: Diagnosis not present

## 2023-01-02 DIAGNOSIS — R2689 Other abnormalities of gait and mobility: Secondary | ICD-10-CM

## 2023-01-02 DIAGNOSIS — M7061 Trochanteric bursitis, right hip: Secondary | ICD-10-CM

## 2023-01-02 DIAGNOSIS — M6281 Muscle weakness (generalized): Secondary | ICD-10-CM

## 2023-01-02 NOTE — Therapy (Signed)
OUTPATIENT PHYSICAL THERAPY TREATMENT NOTE  Progress Note Reporting Period 11-13-22 to 01-02-23  See note below for Objective Data and Assessment of Progress/Goals.       Patient Name: Greg Flowers MRN: [id number] DOB:August 13, 1955,67 y.o., male Today's Date: 01/02/2023   END OF SESSION:  PT End of Session - 01/02/23 1547     Visit Number 10    Number of Visits 17    Date for PT Re-Evaluation 01/12/23    Authorization Type BC[employer]    Activity Tolerance Patient tolerated treatment well    Behavior During Therapy [facility] for tasks assessed/performed              Past Medical History:  Diagnosis Date   Anxiety    Calculus, kidney 2000   Sees Dr. Isabel Caprice, urology.    Cervical spondylosis 2004   sp surgery by Dr. Danielle Dess   Depression    GERD (gastroesophageal reflux disease)    Headache(784.0)    Chronic, on vicodin 5 TID for this.    Hepatitis C    Has occasional visits with Dr. Randa Evens GI, failed RX in 2000.  Liver Biopsy 9/06: Minimally active hepatitis consistent with hepatitis C, minimal necroinflamtory activity grade 1, no incrased fibrosis stage 0.    Herpes labialis    HERPES LABIALIS 04/04/2006   Qualifier: Diagnosis of  By: Aundria Rud MD, Roseanne Reno     History of kidney stones    Hypertension    Pneumonia    Renal stone    Spondylolisthesis of lumbar region    Thalassemia    HGB 9/09 14.4 wtih MCV 72.6   Viral upper respiratory illness 04/12/2021   Wears glasses    Past Surgical History:  Procedure Laterality Date   ANTERIOR CERVICAL DECOMP/DISCECTOMY FUSION N/A 11/21/2017   Procedure: ANTERIOR CERVICAL DECOMPRESSION FUSION, CERVICAL THREE-FOUR, CERVICAL FOUR-FIVE WITH INSTRUMENTATION AND ALLOGRAFT;  Surgeon: Estill Bamberg, MD;  Location: [facility] OR;  Service: Orthopedics;  Laterality: N/A;  ANTERIOR CERVICAL DECOMPRESSION FUSION, CERVICAL THREE-FOUR, CERVICAL FOUR-FIVE WITH INSTRUMENTATION AND ALLOGRAFT   BACK SURGERY  2017   L4-L5 PLATE/SCREWS   CERVICAL DISCECTOMY   2004   Dr Danielle Dess   COLONOSCOPY     LITHOTRIPSY  2004   Dr Logan Bores   NASAL SEPTUM SURGERY  2007   TONSILLECTOMY     TOTAL HIP ARTHROPLASTY Left 11/18/2018   Procedure: Left Anterior Hip Arthroplasty;  Surgeon: Marcene Corning, MD;  Location: [facility] ORS;  Service: Orthopedics;  Laterality: Left;   Patient Active Problem List   Diagnosis Date Noted   Lateral epicondylitis of right elbow 03/02/2022   Hyperlipidemia 12/30/2020   Alpha thalassaemia minor 05/25/2020   Healthcare maintenance 05/25/2020   Acute gout due to renal impairment involving toe 05/20/2019   Neuropathy 05/18/2019   Primary localized osteoarthritis of left hip 11/18/2018   Primary osteoarthritis of left hip 11/18/2018   Microcytic anemia 08/13/2018   Disc disease, degenerative, cervical 11/01/2017   Liver fibrosis 04/30/2017   NSAID long-term use 01/09/2016   Chronic back pain secondary to DJD of cervical, thoracic and lumbar spine and disc herniation 01/09/2016   Osteoarthritis of both knees 11/02/2011   DISORDER, DEPRESSIVE NEC 09/25/2006   Chronic hepatitis C without hepatic coma (HCC) 02/22/2006   Essential hypertension 02/22/2006    PCP: Grayce Sessions, NP  REFERRING PROVIDER: Trena Platt, DO   REFERRING DIAG:  Diagnosis  M96.1 (ICD-10-CM) - Postlaminectomy syndrome, not elsewhere classified    Rationale for Evaluation and Treatment: Rehabilitation  THERAPY  DIAG:  Other low back pain  Muscle weakness (generalized)  Other abnormalities of gait and mobility  Greater trochanteric bursitis of both hips  ONSET DATE: chronic   SUBJECTIVE:                                                                                                                                                                                           SUBJECTIVE STATEMENT: Patient reports he had some soreness in his core after previous session.  EVAL: "I hurt all over. I had 2 neck surgeries, a back surgery, and hip  surgery." He has completed PT following each surgical intervention stating this has been helpful in reducing his pain. He reports aching all over especially when he first wakes in the morning. He localizes his back pain to the center of his low back and can shoot down the posterior thighs with prolonged standing. He reports chronic pain that has been ongoing for 20+ years attributed to his work at the [employer]. He was recently seen in pain management and was prescribed pain medication and muscle relaxers, which have been somewhat helpful in reducing his pain. He reports neuropathy in bilateral feet Lt>Rt and thinks it could be due to his hip surgery. He denies any changes in bowel/bladder.   PAIN:  Are you having pain? Yes: NPRS scale: 6 (at worst 10)/10 Pain location: middle of low back Pain description: ache,sharp Aggravating factors: first thing in AM, transferring from sit to stand, lifting,twisting, prolonged standing  Relieving factors: medication, PT previously  PERTINENT HISTORY:  Lumbar fusion 2017 Lt THA 2020  PRECAUTIONS: None  WEIGHT BEARING RESTRICTIONS: No  FALLS:  Has patient fallen in last 6 months? No  LIVING ENVIRONMENT: Lives with: lives with their spouse Lives in: House/apartment Stairs: Yes: Internal: 16 steps; on right going up Has following equipment at home: Single point cane  OCCUPATION: retired   PLOF: Independent  PATIENT GOALS: "get a little more flexible and help ease the pain."   OBJECTIVE: (objective measures completed at initial evaluation unless otherwise dated)   DIAGNOSTIC FINDINGS:  Lumbar X-ray: FINDINGS: No compression deformities. No spondylolisthesis. No motion with flexion and extension to suggest instability. Postop changes L4-5 fusion and discectomy. L5-S1 disc space narrowing with grade 1 L5 retrolisthesis. Endplate sclerosis. Lower thoracic degenerative changes identified as well.   IMPRESSION: Postop and degenerative  changes.  PATIENT SURVEYS:  FOTO 38% function to 47% predicted  FOTO 53%  SCREENING FOR RED FLAGS: Bowel or bladder incontinence: No Cauda equina syndrome: No   COGNITION: Overall cognitive status: Within functional limits for tasks assessed     SENSATION: [facility]  MUSCLE LENGTH: Hamstrings: moderate tightness bilaterally   POSTURE: decreased lumbar lordosis  PALPATION: TTP bilateral lumbar paraspinals; Lt gluteals;  Pain before restriction PAIVM L-spine  LUMBAR ROM:   AROM eval  Flexion Full LBP  Extension 50% limited pain in buttocks  Right lateral flexion 50% limited  Left lateral flexion 50% limited   Right rotation 25% limited  Left rotation 25% limited    (Blank rows = not tested)  LOWER EXTREMITY ROM:     Active  Right eval Left eval  Hip flexion 99 90  Hip extension    Hip abduction    Hip adduction    Hip internal rotation    Hip external rotation    Knee flexion    Knee extension    Ankle dorsiflexion    Ankle plantarflexion    Ankle inversion    Ankle eversion     (Blank rows = not tested)  LOWER EXTREMITY MMT:    MMT Right eval Left eval  Hip flexion 4 4  Hip extension 4 4  Hip abduction 4- 4-  Hip adduction    Hip internal rotation    Hip external rotation    Knee flexion 5 5  Knee extension 5 5  Ankle dorsiflexion 5 5  Ankle plantarflexion 5 5  Ankle inversion    Ankle eversion 5 5   (Blank rows = not tested)  LUMBAR SPECIAL TESTS:  (-) SLR  FUNCTIONAL TESTS:  5 x STS: 16 seconds  : 1,013 ft   GAIT: Distance walked: 6 MWT Assistive device utilized: None Level of assistance: Complete Independence Comments: antalgic Lt; Rt foot in ER during stance, decreased step length   [facility] Adult PT Treatment:                                                DATE: 01-02-23 Pt enters building ambulating independently. Treatment took place in water 3.8 to  4 ft 8 in. deep depending upon activity.  Pt entered and exited the pool via  stair and handrails independently.    Acclimating to water by walking back and forth and side stepping  with use of aquatic flotation rainbow DB  Aquatic Exercise:   Squats x 25 Hip abduction/adduction x10 BIL Hip ext/flex with knee straight x20 BIL March to kick x20 BIL Heel/toe raises x25 Gentle lumbar rotation with noodle x1' Cat/Camel using yellow pool noodle x15 Lunge across pool width x2 laps Sidestepping with rainbow DB shoulder abd/add x2 laps With UE on submerged pool seat,at 90 flexion, one legged hip extension 10 x R and L Runners stretch bottom step x30" BIL Hamstring stretch bottom step x30" BIL Back against wall yellow DB shoulder flex/ext x20 Supine  aquatic abdominal hollowing  barbell submerged for abdominal engagement needs PT assist Combo prone to supine with legs extended for 3 minutes to encourage active abdominal engagement. needs PT assist Supine  floating torso twist for External and Internal obliques  Single limb balance in water without flotation device Single limb balance in water with added perturbations by therapist    Pt requires the buoyancy of water for active assisted exercises with buoyancy supported for strengthening and AROM exercises. Hydrostatic pressure also supports joints by unweighting joint load by at least 50 % in 3-4 feet depth water. 80% in chest to neck deep water. Water will provide assistance  with movement using the current and laminar flow while the buoyancy reduces weight bearing. Pt requires the viscosity of the water for resistance endurance  with strengthening exercises.    [facility] Adult PT Treatment:                                                DATE: 12-28-22 Pt enters building ambulating independently. Treatment took place in water 3.8 to  4 ft 8 in. deep depending upon activity.  Pt entered and exited the pool via stair and handrails independently.    Acclimating to water by walking back and forth and side stepping  with use of  aquatic flotation rainbow DB  Aquatic Exercise:   Squats x 25 Hip abduction/ x10 BIL with Rider band resistance Hip adduction/ x10 BIL with Rider band resistance Hip ext knee straight x20 BIL with Rider band resistance Hip flex knee straight x20 BIL with Rider band resistance Gentle lumbar rotation with noodle x1' Cat/Camel using yellow pool noodle x15 Step ups for  R and L x 15 Runners stretch bottom step x30" BIL Hamstring stretch bottom step x30" BIL Back against wall  foot stomps with yellow noodle  [place] Ragaz, Pt with lumbar belt around hips and nek doodle for neck support.  PT at torso and assisting with trunk left to right and vice versa to engage trunk muscles. PT then rotated trunk in order to engage abdominal (internal and external obliques) Emphasis on breathing techniques to draw in abdominals for support.  Pt then utilizing posterior chain and engaging Hip extension and knee flexion with water resistance . LAD over R LE.   Pt requires the buoyancy of water for active assisted exercises with buoyancy supported for strengthening and AROM exercises. Hydrostatic pressure also supports joints by unweighting joint load by at least 50 % in 3-4 feet depth water. 80% in chest to neck deep water. Water will provide assistance with movement using the current and laminar flow while the buoyancy reduces weight bearing. Pt requires the viscosity of the water for resistance endurance  with strengthening exercises.  [facility] Adult PT Treatment:                                                DATE: 12/25/22 Therapeutic Exercise: Nu step L5 UELE during subjective  Paloff press 2x12 BIL, cues for posture STS 10# 8, 15# x5 cues for pacing Hooklying hip flexion isometrics x12 BIL cues for breath control Green band bridge x8 cues for comfortable ROM HEP review     [facility] Adult PT Treatment:                                                DATE: 12/20/22 Therapeutic Exercise: Nustep L5 UE/LE x 5  minutes  Palloff press green band double 10 x 2 each Black band pull downs x 20 Hip abduction at wall with opposite arm abduction isometric on wall  10 x 2 each  Isometric hip flexion isometrics  x 10  Opposite isometric hip flexion isometrics  x 10 STS 10 # x 15  Therapeutic Activity: FOTO      PATIENT EDUCATION:  Education details: see treatment Person educated: Patient Education method: Explanation, Demonstration, Tactile cues, Verbal cues, and Handouts Education comprehension: verbalized understanding, returned demonstration, verbal cues required, tactile cues required, and needs further education  HOME EXERCISE PROGRAM: Access Code: [facility] URL: [email or web address] Date: 11/28/2022 Prepared by: Joellyn Rued  Exercises - Supine Lower Trunk Rotation  - 1 x daily - 7 x weekly - 2 sets - 10 reps - Seated Hamstring Stretch  - 1 x daily - 7 x weekly - 3 sets - 30 sec  hold - Hooklying Clamshell with Resistance  - 1 x daily - 7 x weekly - 2 sets - 10 reps - Supine Hip Adduction Isometric with Ball  - 1 x daily - 7 x weekly - 2 sets - 10 reps - 3 hold - Supine Posterior Pelvic Tilt  - 1 x daily - 7 x weekly - 2 sets - 10 reps - 3 hold - Supine Bridge  - 1 x daily - 7 x weekly - 2 sets - 10 reps - 3 hold  ASSESSMENT:  CLINICAL IMPRESSION: Patient presents to aquatic PT session reporting continued lower back pain radiating in to left buttock and side of hip as well as balance. Session today focused on core, proximal hip and LE strengthening as well as activity tolerance in the aquatic environment for use of buoyancy to offload joints and the viscosity of water as resistance during therapeutic exercise. Patient was able to tolerate all prescribed exercises in the aquatic environment with no adverse effects. Patient continues to benefit from skilled PT services on land and aquatic based and should be progressed as able to improve functional independence.   EVAL:  Patient is a 67 y.o. male who was seen today for physical therapy evaluation and treatment for chronic low back pain that he attributes to overuse from his job with the [place] [employer]. He has undergone multiple surgical procedures including lumbar fusion and Lt THA, but continues to report ongoing high pain levels that have somewhat improved recently with medication. Upon assessment he is noted to have limited and painful trunk AROM, hip weakness, postural abnormalities, and scores below age-related normative values on the 6 minute walk test. He will benefit from skilled PT to address the above stated deficits in order to optimize his function and assist in overall pain reduction.   OBJECTIVE IMPAIRMENTS: Abnormal gait, decreased activity tolerance, decreased endurance, decreased knowledge of condition, decreased mobility, difficulty walking, decreased ROM, decreased strength, increased fascial restrictions, impaired flexibility, improper body mechanics, postural dysfunction, and pain.   ACTIVITY LIMITATIONS: carrying, lifting, bending, standing, squatting, transfers, and locomotion level  PARTICIPATION LIMITATIONS: meal prep, cleaning, laundry, shopping, community activity, and yard work  PERSONAL FACTORS: Age, Fitness, Time since onset of injury/illness/exacerbation, and 3+ comorbidities: see PMH/PSH above  are also affecting patient's functional outcome.   REHAB POTENTIAL: Fair see personal factors  CLINICAL DECISION MAKING: Evolving/moderate complexity  EVALUATION COMPLEXITY: Moderate   GOALS: Goals reviewed with patient? Yes  SHORT TERM GOALS: Target date: 12/11/2022    Patient will be independent and compliant with initial HEP.   Baseline: see above Goal status: MET- pt is completing his HEP correctly  2.  Patient will complete 5 x STS in </=13 seconds to improve ease of transfers.  Baseline: see above   12/11/22: 13" Goal status: MET  3.  Patient will demonstrate proper  lifting mechanics without an increase in back pain.  Baseline: increased pain  Goal status: INITIAL   LONG TERM GOALS: Target date: 01/12/2023    Patient will score at least 47% on FOTO to signify clinically meaningful improvement in functional abilities.   Baseline: see above Goal status: INITIAL  2.  Patient will walk at least 1200 ft during 6 MWT to improve ability to navigate in the community.  Baseline: see above Goal status: INITIAL  3.  Patient will demonstrate 4+/5 bilateral hip strength to improve stability about the chain with prolonged walking/standing activity.  Baseline: see above Goal status: INITIAL  4.  Patient will be independent with advanced home program to assist in management of his chronic condition.  Baseline: see above Goal status: INITIAL    PLAN:  PT FREQUENCY: 1-2x/week  PT DURATION: 8 weeks  PLANNED INTERVENTIONS: Therapeutic exercises, Therapeutic activity, Neuromuscular re-education, Balance training, Gait training, Patient/Family education, Self Care, Aquatic Therapy, Dry Needling, Electrical stimulation, Cryotherapy, Moist heat, Manual therapy, and Re-evaluation.  PLAN FOR NEXT SESSION: review and progress HEP prn; trunk mobility, hip stretching, core/hip strengthening, lifting mechanics.   Garen Lah, PT, ATRIC Certified Exercise Expert for the Aging Adult  01/02/23 4:43 PM Phone: [phone] Fax: [phone]

## 2023-01-03 NOTE — Therapy (Signed)
OUTPATIENT PHYSICAL THERAPY TREATMENT NOTE      Patient Name: Greg Flowers MRN: [id number] DOB:[birth year],67 y.o., male Today's Date: 01/04/2023   END OF SESSION:  PT End of Session - 01/04/23 1431     Visit Number 11    Number of Visits 17    Date for PT Re-Evaluation 01/12/23    Authorization Type [employer]    PT Start Time 1430    PT Stop Time 1515    PT Time Calculation (min) 45 min    Activity Tolerance Patient tolerated treatment well    Behavior During Therapy WFL for tasks assessed/performed               Past Medical History:  Diagnosis Date   Anxiety    Calculus, kidney 2000   Sees Dr. Isabel Caprice, urology.    Cervical spondylosis 2004   sp surgery by Dr. Danielle Dess   Depression    GERD (gastroesophageal reflux disease)    Headache(784.0)    Chronic, on vicodin 5 TID for this.    Hepatitis C    Has occasional visits with Dr. Randa Evens GI, failed RX in 2000.  Liver Biopsy 9/06: Minimally active hepatitis consistent with hepatitis C, minimal necroinflamtory activity grade 1, no incrased fibrosis stage 0.    Herpes labialis    HERPES LABIALIS 04/04/2006   Qualifier: Diagnosis of  By: Aundria Rud MD, Roseanne Reno     History of kidney stones    Hypertension    Pneumonia    Renal stone    Spondylolisthesis of lumbar region    Thalassemia    HGB 9/09 14.4 wtih MCV 72.6   Viral upper respiratory illness 04/12/2021   Wears glasses    Past Surgical History:  Procedure Laterality Date   ANTERIOR CERVICAL DECOMP/DISCECTOMY FUSION N/A 11/21/2017   Procedure: ANTERIOR CERVICAL DECOMPRESSION FUSION, CERVICAL THREE-FOUR, CERVICAL FOUR-FIVE WITH INSTRUMENTATION AND ALLOGRAFT;  Surgeon: Estill Bamberg, MD;  Location: [facility] OR;  Service: Orthopedics;  Laterality: N/A;  ANTERIOR CERVICAL DECOMPRESSION FUSION, CERVICAL THREE-FOUR, CERVICAL FOUR-FIVE WITH INSTRUMENTATION AND ALLOGRAFT   BACK SURGERY  2017   L4-L5 PLATE/SCREWS   CERVICAL DISCECTOMY  2004   Dr Danielle Dess   COLONOSCOPY      LITHOTRIPSY  2004   Dr Logan Bores   NASAL SEPTUM SURGERY  2007   TONSILLECTOMY     TOTAL HIP ARTHROPLASTY Left 11/18/2018   Procedure: Left Anterior Hip Arthroplasty;  Surgeon: Marcene Corning, MD;  Location: [facility];  Service: Orthopedics;  Laterality: Left;   Patient Active Problem List   Diagnosis Date Noted   Lateral epicondylitis of right elbow 03/02/2022   Hyperlipidemia 12/30/2020   Alpha thalassaemia minor 05/25/2020   Healthcare maintenance 05/25/2020   Acute gout due to renal impairment involving toe 05/20/2019   Neuropathy 05/18/2019   Primary localized osteoarthritis of left hip 11/18/2018   Primary osteoarthritis of left hip 11/18/2018   Microcytic anemia 08/13/2018   Disc disease, degenerative, cervical 11/01/2017   Liver fibrosis 04/30/2017   NSAID long-term use 01/09/2016   Chronic back pain secondary to DJD of cervical, thoracic and lumbar spine and disc herniation 01/09/2016   Osteoarthritis of both knees 11/02/2011   DISORDER, DEPRESSIVE NEC 09/25/2006   Chronic hepatitis C without hepatic coma (HCC) 02/22/2006   Essential hypertension 02/22/2006    PCP: Grayce Sessions, NP  REFERRING PROVIDER: Trena Platt, DO   REFERRING DIAG:  Diagnosis  M96.1 (ICD-10-CM) - Postlaminectomy syndrome, not elsewhere classified    Rationale for Evaluation and  Treatment: Rehabilitation  THERAPY DIAG:  Other low back pain  Muscle weakness (generalized)  Other abnormalities of gait and mobility  ONSET DATE: chronic   SUBJECTIVE:                                                                                                                                                                                           SUBJECTIVE STATEMENT: Patient reports continued lower back pain and LLE pain.  EVAL: "I hurt all over. I had 2 neck surgeries, a back surgery, and hip surgery." He has completed PT following each surgical intervention stating this has been helpful in  reducing his pain. He reports aching all over especially when he first wakes in the morning. He localizes his back pain to the center of his low back and can shoot down the posterior thighs with prolonged standing. He reports chronic pain that has been ongoing for 20+ years attributed to his work at the [employer]. He was recently seen in pain management and was prescribed pain medication and muscle relaxers, which have been somewhat helpful in reducing his pain. He reports neuropathy in bilateral feet Lt>Rt and thinks it could be due to his hip surgery. He denies any changes in bowel/bladder.   PAIN:  Are you having pain? Yes: NPRS scale: 5 (at worst 10)/10 Pain location: middle of low back Pain description: ache,sharp Aggravating factors: first thing in AM, transferring from sit to stand, lifting,twisting, prolonged standing  Relieving factors: medication, PT previously  PERTINENT HISTORY:  Lumbar fusion 2017 Lt THA 2020  PRECAUTIONS: None  WEIGHT BEARING RESTRICTIONS: No  FALLS:  Has patient fallen in last 6 months? No  LIVING ENVIRONMENT: Lives with: lives with their spouse Lives in: House/apartment Stairs: Yes: Internal: 16 steps; on right going up Has following equipment at home: Single point cane  OCCUPATION: retired   PLOF: Independent  PATIENT GOALS: "get a little more flexible and help ease the pain."   OBJECTIVE: (objective measures completed at initial evaluation unless otherwise dated)   DIAGNOSTIC FINDINGS:  Lumbar X-ray: FINDINGS: No compression deformities. No spondylolisthesis. No motion with flexion and extension to suggest instability. Postop changes L4-5 fusion and discectomy. L5-S1 disc space narrowing with grade 1 L5 retrolisthesis. Endplate sclerosis. Lower thoracic degenerative changes identified as well.   IMPRESSION: Postop and degenerative changes.  PATIENT SURVEYS:  FOTO 38% function to 47% predicted  FOTO 53%  SCREENING FOR RED  FLAGS: Bowel or bladder incontinence: No Cauda equina syndrome: No   COGNITION: Overall cognitive status: Within functional limits for tasks assessed     SENSATION: WFL  MUSCLE LENGTH: Hamstrings: moderate tightness  bilaterally   POSTURE: decreased lumbar lordosis  PALPATION: TTP bilateral lumbar paraspinals; Lt gluteals;  Pain before restriction PAIVM L-spine  LUMBAR ROM:   AROM eval  Flexion Full LBP  Extension 50% limited pain in buttocks  Right lateral flexion 50% limited  Left lateral flexion 50% limited   Right rotation 25% limited  Left rotation 25% limited    (Blank rows = not tested)  LOWER EXTREMITY ROM:     Active  Right eval Left eval  Hip flexion 99 90  Hip extension    Hip abduction    Hip adduction    Hip internal rotation    Hip external rotation    Knee flexion    Knee extension    Ankle dorsiflexion    Ankle plantarflexion    Ankle inversion    Ankle eversion     (Blank rows = not tested)  LOWER EXTREMITY MMT:    MMT Right eval Left eval  Hip flexion 4 4  Hip extension 4 4  Hip abduction 4- 4-  Hip adduction    Hip internal rotation    Hip external rotation    Knee flexion 5 5  Knee extension 5 5  Ankle dorsiflexion 5 5  Ankle plantarflexion 5 5  Ankle inversion    Ankle eversion 5 5   (Blank rows = not tested)  LUMBAR SPECIAL TESTS:  (-) SLR  FUNCTIONAL TESTS:  5 x STS: 16 seconds  : 1,013 ft   GAIT: Distance walked: 6 MWT Assistive device utilized: None Level of assistance: Complete Independence Comments: antalgic Lt; Rt foot in ER during stance, decreased step length   [facility] Adult PT Treatment:                                                DATE: 01-04-23 Pt enters building ambulating independently. Treatment took place in water 3.8 to  4 ft 8 in. deep depending upon activity.  Pt entered and exited the pool via stair and handrails independently.    Acclimating to water by walking back and forth and side  stepping  with use of aquatic flotation rainbow DB  Aquatic Exercise: Squats x 25 Hip abduction/adduction x20 BIL Hip ext/flex with knee straight x20 BIL March to kick x20 BIL Heel/toe raises x25 Warrior I pool noodle lift x10 BIL Gentle lumbar rotation with noodle x1' Cat/Camel using yellow pool noodle x15 Lunge across pool width x2 laps Sidestepping with rainbow DB shoulder abd/add x1 lap With UE on submerged pool seat,at 90 flexion, one legged hip extension 10 x R and L Runners stretch bottom step x30" BIL Hamstring stretch bottom step x30" BIL Single limb balance in water without flotation device   Pt requires the buoyancy of water for active assisted exercises with buoyancy supported for strengthening and AROM exercises. Hydrostatic pressure also supports joints by unweighting joint load by at least 50 % in 3-4 feet depth water. 80% in chest to neck deep water. Water will provide assistance with movement using the current and laminar flow while the buoyancy reduces weight bearing. Pt requires the viscosity of the water for resistance endurance  with strengthening exercises.  [facility] Adult PT Treatment:  DATE: 01-02-23 Pt enters building ambulating independently. Treatment took place in water 3.8 to  4 ft 8 in. deep depending upon activity.  Pt entered and exited the pool via stair and handrails independently.    Acclimating to water by walking back and forth and side stepping  with use of aquatic flotation rainbow DB  Aquatic Exercise:   Squats x 25 Hip abduction/adduction x10 BIL Hip ext/flex with knee straight x20 BIL March to kick x20 BIL Heel/toe raises x25 Gentle lumbar rotation with noodle x1' Cat/Camel using yellow pool noodle x15 Lunge across pool width x2 laps Sidestepping with rainbow DB shoulder abd/add x2 laps With UE on submerged pool seat,at 90 flexion, one legged hip extension 10 x R and L Runners stretch bottom step  x30" BIL Hamstring stretch bottom step x30" BIL Back against wall yellow DB shoulder flex/ext x20 Supine  aquatic abdominal hollowing  barbell submerged for abdominal engagement needs PT assist Combo prone to supine with legs extended for 3 minutes to encourage active abdominal engagement. needs PT assist Supine  floating torso twist for External and Internal obliques  Single limb balance in water without flotation device Single limb balance in water with added perturbations by therapist    Pt requires the buoyancy of water for active assisted exercises with buoyancy supported for strengthening and AROM exercises. Hydrostatic pressure also supports joints by unweighting joint load by at least 50 % in 3-4 feet depth water. 80% in chest to neck deep water. Water will provide assistance with movement using the current and laminar flow while the buoyancy reduces weight bearing. Pt requires the viscosity of the water for resistance endurance  with strengthening exercises.    [facility] Adult PT Treatment:                                                DATE: 12-28-22 Pt enters building ambulating independently. Treatment took place in water 3.8 to  4 ft 8 in. deep depending upon activity.  Pt entered and exited the pool via stair and handrails independently.    Acclimating to water by walking back and forth and side stepping  with use of aquatic flotation rainbow DB  Aquatic Exercise:   Squats x 25 Hip abduction/ x10 BIL with Rider band resistance Hip adduction/ x10 BIL with Rider band resistance Hip ext knee straight x20 BIL with Rider band resistance Hip flex knee straight x20 BIL with Rider band resistance Gentle lumbar rotation with noodle x1' Cat/Camel using yellow pool noodle x15 Step ups for  R and L x 15 Runners stretch bottom step x30" BIL Hamstring stretch bottom step x30" BIL Back against wall  foot stomps with yellow noodle  [place], Pt with lumbar belt around hips and nek doodle  for neck support.  PT at torso and assisting with trunk left to right and vice versa to engage trunk muscles. PT then rotated trunk in order to engage abdominal (internal and external obliques) Emphasis on breathing techniques to draw in abdominals for support.  Pt then utilizing posterior chain and engaging Hip extension and knee flexion with water resistance . LAD over R LE.   Pt requires the buoyancy of water for active assisted exercises with buoyancy supported for strengthening and AROM exercises. Hydrostatic pressure also supports joints by unweighting joint load by at least 50 % in 3-4 feet  depth water. 80% in chest to neck deep water. Water will provide assistance with movement using the current and laminar flow while the buoyancy reduces weight bearing. Pt requires the viscosity of the water for resistance endurance  with strengthening exercises.    PATIENT EDUCATION:  Education details: see treatment Person educated: Patient Education method: Explanation, Demonstration, Tactile cues, Verbal cues, and Handouts Education comprehension: verbalized understanding, returned demonstration, verbal cues required, tactile cues required, and needs further education  HOME EXERCISE PROGRAM: Access Code: [facility] URL: [email or web address] Date: 11/28/2022 Prepared by: Joellyn Rued  Exercises - Supine Lower Trunk Rotation  - 1 x daily - 7 x weekly - 2 sets - 10 reps - Seated Hamstring Stretch  - 1 x daily - 7 x weekly - 3 sets - 30 sec  hold - Hooklying Clamshell with Resistance  - 1 x daily - 7 x weekly - 2 sets - 10 reps - Supine Hip Adduction Isometric with Ball  - 1 x daily - 7 x weekly - 2 sets - 10 reps - 3 hold - Supine Posterior Pelvic Tilt  - 1 x daily - 7 x weekly - 2 sets - 10 reps - 3 hold - Supine Bridge  - 1 x daily - 7 x weekly - 2 sets - 10 reps - 3 hold  ASSESSMENT:  CLINICAL IMPRESSION: Patient presents to aquatic PT session reporting continued lower back pain  and radiating to the LLE. Session today focused on LE strengthening and balance tasks in the aquatic environment for use of buoyancy to offload joints and the viscosity of water as resistance during therapeutic exercise. Patient was able to tolerate all prescribed exercises in the aquatic environment with no adverse effects. Patient continues to benefit from skilled PT services on land and aquatic based and should be progressed as able to improve functional independence.   EVAL: Patient is a 67 y.o. male who was seen today for physical therapy evaluation and treatment for chronic low back pain that he attributes to overuse from his job with the [place] [employer]. He has undergone multiple surgical procedures including lumbar fusion and Lt THA, but continues to report ongoing high pain levels that have somewhat improved recently with medication. Upon assessment he is noted to have limited and painful trunk AROM, hip weakness, postural abnormalities, and scores below age-related normative values on the 6 minute walk test. He will benefit from skilled PT to address the above stated deficits in order to optimize his function and assist in overall pain reduction.   OBJECTIVE IMPAIRMENTS: Abnormal gait, decreased activity tolerance, decreased endurance, decreased knowledge of condition, decreased mobility, difficulty walking, decreased ROM, decreased strength, increased fascial restrictions, impaired flexibility, improper body mechanics, postural dysfunction, and pain.   ACTIVITY LIMITATIONS: carrying, lifting, bending, standing, squatting, transfers, and locomotion level  PARTICIPATION LIMITATIONS: meal prep, cleaning, laundry, shopping, community activity, and yard work  PERSONAL FACTORS: Age, Fitness, Time since onset of injury/illness/exacerbation, and 3+ comorbidities: see PMH/PSH above  are also affecting patient's functional outcome.   REHAB POTENTIAL: Fair see personal factors  CLINICAL DECISION  MAKING: Evolving/moderate complexity  EVALUATION COMPLEXITY: Moderate   GOALS: Goals reviewed with patient? Yes  SHORT TERM GOALS: Target date: 12/11/2022    Patient will be independent and compliant with initial HEP.   Baseline: see above Goal status: MET- pt is completing his HEP correctly  2.  Patient will complete 5 x STS in </=13 seconds to improve ease of transfers.  Baseline: see above  12/11/22: 13" Goal status: MET  3.  Patient will demonstrate proper lifting mechanics without an increase in back pain.  Baseline: increased pain  Goal status: INITIAL   LONG TERM GOALS: Target date: 01/12/2023    Patient will score at least 47% on FOTO to signify clinically meaningful improvement in functional abilities.   Baseline: see above Goal status: INITIAL  2.  Patient will walk at least 1200 ft during 6 MWT to improve ability to navigate in the community.  Baseline: see above Goal status: INITIAL  3.  Patient will demonstrate 4+/5 bilateral hip strength to improve stability about the chain with prolonged walking/standing activity.  Baseline: see above Goal status: INITIAL  4.  Patient will be independent with advanced home program to assist in management of his chronic condition.  Baseline: see above Goal status: INITIAL    PLAN:  PT FREQUENCY: 1-2x/week  PT DURATION: 8 weeks  PLANNED INTERVENTIONS: Therapeutic exercises, Therapeutic activity, Neuromuscular re-education, Balance training, Gait training, Patient/Family education, Self Care, Aquatic Therapy, Dry Needling, Electrical stimulation, Cryotherapy, Moist heat, Manual therapy, and Re-evaluation.  PLAN FOR NEXT SESSION: review and progress HEP prn; trunk mobility, hip stretching, core/hip strengthening, lifting mechanics.   Berta Minor PTA 01/04/23 3:20 PM Phone: [phone] Fax: [phone]

## 2023-01-03 NOTE — Therapy (Signed)
OUTPATIENT PHYSICAL THERAPY TREATMENT NOTE       Patient Name: Greg Flowers MRN: [id number] DOB:02/23/1956,67 y.o., male Today's Date: 01/07/2023   END OF SESSION:  PT End of Session - 01/07/23 1333     Visit Number 12    Number of Visits 17    Date for PT Re-Evaluation 01/12/23    Authorization Type [employer]    PT Start Time 1331    PT Stop Time 1415    PT Time Calculation (min) 44 min    Activity Tolerance Patient tolerated treatment well    Behavior During Therapy WFL for tasks assessed/performed               Past Medical History:  Diagnosis Date   Anxiety    Calculus, kidney 2000   Sees Dr. Isabel Caprice, urology.    Cervical spondylosis 2004   sp surgery by Dr. Danielle Dess   Depression    GERD (gastroesophageal reflux disease)    Headache(784.0)    Chronic, on vicodin 5 TID for this.    Hepatitis C    Has occasional visits with Dr. Randa Evens GI, failed RX in 2000.  Liver Biopsy 9/06: Minimally active hepatitis consistent with hepatitis C, minimal necroinflamtory activity grade 1, no incrased fibrosis stage 0.    Herpes labialis    HERPES LABIALIS 04/04/2006   Qualifier: Diagnosis of  By: Aundria Rud MD, Roseanne Reno     History of kidney stones    Hypertension    Pneumonia    Renal stone    Spondylolisthesis of lumbar region    Thalassemia    HGB 9/09 14.4 wtih MCV 72.6   Viral upper respiratory illness 04/12/2021   Wears glasses    Past Surgical History:  Procedure Laterality Date   ANTERIOR CERVICAL DECOMP/DISCECTOMY FUSION N/A 11/21/2017   Procedure: ANTERIOR CERVICAL DECOMPRESSION FUSION, CERVICAL THREE-FOUR, CERVICAL FOUR-FIVE WITH INSTRUMENTATION AND ALLOGRAFT;  Surgeon: Estill Bamberg, MD;  Location: [facility] OR;  Service: Orthopedics;  Laterality: N/A;  ANTERIOR CERVICAL DECOMPRESSION FUSION, CERVICAL THREE-FOUR, CERVICAL FOUR-FIVE WITH INSTRUMENTATION AND ALLOGRAFT   BACK SURGERY  2017   L4-L5 PLATE/SCREWS   CERVICAL DISCECTOMY  2004   Dr Danielle Dess   COLONOSCOPY      LITHOTRIPSY  2004   Dr Logan Bores   NASAL SEPTUM SURGERY  2007   TONSILLECTOMY     TOTAL HIP ARTHROPLASTY Left 11/18/2018   Procedure: Left Anterior Hip Arthroplasty;  Surgeon: Marcene Corning, MD;  Location: [facility];  Service: Orthopedics;  Laterality: Left;   Patient Active Problem List   Diagnosis Date Noted   Lateral epicondylitis of right elbow 03/02/2022   Hyperlipidemia 12/30/2020   Alpha thalassaemia minor 05/25/2020   Healthcare maintenance 05/25/2020   Acute gout due to renal impairment involving toe 05/20/2019   Neuropathy 05/18/2019   Primary localized osteoarthritis of left hip 11/18/2018   Primary osteoarthritis of left hip 11/18/2018   Microcytic anemia 08/13/2018   Disc disease, degenerative, cervical 11/01/2017   Liver fibrosis 04/30/2017   NSAID long-term use 01/09/2016   Chronic back pain secondary to DJD of cervical, thoracic and lumbar spine and disc herniation 01/09/2016   Osteoarthritis of both knees 11/02/2011   DISORDER, DEPRESSIVE NEC 09/25/2006   Chronic hepatitis C without hepatic coma (HCC) 02/22/2006   Essential hypertension 02/22/2006    PCP: Grayce Sessions, NP  REFERRING PROVIDER: Trena Platt, DO   REFERRING DIAG:  Diagnosis  M96.1 (ICD-10-CM) - Postlaminectomy syndrome, not elsewhere classified    Rationale for Evaluation  and Treatment: Rehabilitation  THERAPY DIAG:  Other low back pain  Muscle weakness (generalized)  Other abnormalities of gait and mobility  Greater trochanteric bursitis of both hips  ONSET DATE: chronic   SUBJECTIVE:                                                                                                                                                                                           SUBJECTIVE STATEMENT: Patient reports  5/10. Pain in low back and left hip.  I am interested in dry needling today.    EVAL: "I hurt all over. I had 2 neck surgeries, a back surgery, and hip surgery." He has  completed PT following each surgical intervention stating this has been helpful in reducing his pain. He reports aching all over especially when he first wakes in the morning. He localizes his back pain to the center of his low back and can shoot down the posterior thighs with prolonged standing. He reports chronic pain that has been ongoing for 20+ years attributed to his work at the [employer]. He was recently seen in pain management and was prescribed pain medication and muscle relaxers, which have been somewhat helpful in reducing his pain. He reports neuropathy in bilateral feet Lt>Rt and thinks it could be due to his hip surgery. He denies any changes in bowel/bladder.   PAIN:  Are you having pain? Yes: NPRS scale: 6 (at worst 10)/10 Pain location: middle of low back Pain description: ache,sharp Aggravating factors: first thing in AM, transferring from sit to stand, lifting,twisting, prolonged standing  Relieving factors: medication, PT previously  PERTINENT HISTORY:  Lumbar fusion 2017 Lt THA 2020  PRECAUTIONS: None  WEIGHT BEARING RESTRICTIONS: No  FALLS:  Has patient fallen in last 6 months? No  LIVING ENVIRONMENT: Lives with: lives with their spouse Lives in: House/apartment Stairs: Yes: Internal: 16 steps; on right going up Has following equipment at home: Single point cane  OCCUPATION: retired   PLOF: Independent  PATIENT GOALS: "get a little more flexible and help ease the pain."   OBJECTIVE: (objective measures completed at initial evaluation unless otherwise dated)   DIAGNOSTIC FINDINGS:  Lumbar X-ray: FINDINGS: No compression deformities. No spondylolisthesis. No motion with flexion and extension to suggest instability. Postop changes L4-5 fusion and discectomy. L5-S1 disc space narrowing with grade 1 L5 retrolisthesis. Endplate sclerosis. Lower thoracic degenerative changes identified as well.   IMPRESSION: Postop and degenerative changes.  PATIENT  SURVEYS:  FOTO 38% function to 47% predicted  FOTO 53%  SCREENING FOR RED FLAGS: Bowel or bladder incontinence: No Cauda equina syndrome: No   COGNITION: Overall  cognitive status: Within functional limits for tasks assessed     SENSATION: WFL  MUSCLE LENGTH: Hamstrings: moderate tightness bilaterally   POSTURE: decreased lumbar lordosis  PALPATION: TTP bilateral lumbar paraspinals; Lt gluteals;  Pain before restriction PAIVM L-spine  LUMBAR ROM:   AROM eval  Flexion Full LBP  Extension 50% limited pain in buttocks  Right lateral flexion 50% limited  Left lateral flexion 50% limited   Right rotation 25% limited  Left rotation 25% limited    (Blank rows = not tested)  LOWER EXTREMITY ROM:     Active  Right eval Left eval  Hip flexion 99 90  Hip extension    Hip abduction    Hip adduction    Hip internal rotation    Hip external rotation    Knee flexion    Knee extension    Ankle dorsiflexion    Ankle plantarflexion    Ankle inversion    Ankle eversion     (Blank rows = not tested)  LOWER EXTREMITY MMT:    MMT Right eval Left eval  Hip flexion 4 4  Hip extension 4 4  Hip abduction 4- 4-  Hip adduction    Hip internal rotation    Hip external rotation    Knee flexion 5 5  Knee extension 5 5  Ankle dorsiflexion 5 5  Ankle plantarflexion 5 5  Ankle inversion    Ankle eversion 5 5   (Blank rows = not tested)  LUMBAR SPECIAL TESTS:  (-) SLR  FUNCTIONAL TESTS:  5 x STS: 16 seconds  : 1,013 ft   GAIT: Distance walked: 6 MWT Assistive device utilized: None Level of assistance: Complete Independence Comments: antalgic Lt; Rt foot in ER during stance, decreased step length  [facility] Adult PT Treatment:                                                DATE: 01-07-23 Therapeutic Exercise: 15 # KB  3 x 8 STS  5 Deep squats Child pose after TPDN  Piriformis stretch after TPDN  30 sec x 2 on Left  1/2 Lunge  8 x 1 on R and L Manual Therapy: STW  Left gluteals STW Myofascial release of Left QL Trigger Point Dry-Needling performed     by Garen Lah Treatment instructions: Expect mild to moderate muscle soreness. S/S of pneumothorax if dry needled over a lung field, and to seek immediate medical attention should they occur. Patient verbalized understanding of these instructions and education.  Patient Consent Given: Yes Education handout provided: Previously provided Muscles treated:Left Gluteals and piriformis.  Lumbar L-3 to L-5 bil Left posterior greater trochanter Electrical stimulation performed: yes Parameters: 25 pps and intensity to increase every 2 minutes for 9 minutes Treatment response/outcome: twitch response noted, pt noted relief  [facility] Adult PT Treatment:                                                DATE: 01-02-23 Pt enters building ambulating independently. Treatment took place in water 3.8 to  4 ft 8 in. deep depending upon activity.  Pt entered and exited the pool via stair and handrails independently.    Acclimating to water by walking  back and forth and side stepping  with use of aquatic flotation rainbow DB  Aquatic Exercise:   Squats x 25 Hip abduction/adduction x10 BIL Hip ext/flex with knee straight x20 BIL March to kick x20 BIL Heel/toe raises x25 Gentle lumbar rotation with noodle x1' Cat/Camel using yellow pool noodle x15 Lunge across pool width x2 laps Sidestepping with rainbow DB shoulder abd/add x2 laps With UE on submerged pool seat,at 90 flexion, one legged hip extension 10 x R and L Runners stretch bottom step x30" BIL Hamstring stretch bottom step x30" BIL Back against wall yellow DB shoulder flex/ext x20 Supine  aquatic abdominal hollowing  barbell submerged for abdominal engagement needs PT assist Combo prone to supine with legs extended for 3 minutes to encourage active abdominal engagement. needs PT assist Supine  floating torso twist for External and Internal obliques  Single limb  balance in water without flotation device Single limb balance in water with added perturbations by therapist    Pt requires the buoyancy of water for active assisted exercises with buoyancy supported for strengthening and AROM exercises. Hydrostatic pressure also supports joints by unweighting joint load by at least 50 % in 3-4 feet depth water. 80% in chest to neck deep water. Water will provide assistance with movement using the current and laminar flow while the buoyancy reduces weight bearing. Pt requires the viscosity of the water for resistance endurance  with strengthening exercises.    [facility] Adult PT Treatment:                                                DATE: 12-28-22 Pt enters building ambulating independently. Treatment took place in water 3.8 to  4 ft 8 in. deep depending upon activity.  Pt entered and exited the pool via stair and handrails independently.    Acclimating to water by walking back and forth and side stepping  with use of aquatic flotation rainbow DB  Aquatic Exercise:   Squats x 25 Hip abduction/ x10 BIL with Rider band resistance Hip adduction/ x10 BIL with Rider band resistance Hip ext knee straight x20 BIL with Rider band resistance Hip flex knee straight x20 BIL with Rider band resistance Gentle lumbar rotation with noodle x1' Cat/Camel using yellow pool noodle x15 Step ups for  R and L x 15 Runners stretch bottom step x30" BIL Hamstring stretch bottom step x30" BIL Back against wall  foot stomps with yellow noodle  [place] Ragaz, Pt with lumbar belt around hips and nek doodle for neck support.  PT at torso and assisting with trunk left to right and vice versa to engage trunk muscles. PT then rotated trunk in order to engage abdominal (internal and external obliques) Emphasis on breathing techniques to draw in abdominals for support.  Pt then utilizing posterior chain and engaging Hip extension and knee flexion with water resistance . LAD over R LE.   Pt  requires the buoyancy of water for active assisted exercises with buoyancy supported for strengthening and AROM exercises. Hydrostatic pressure also supports joints by unweighting joint load by at least 50 % in 3-4 feet depth water. 80% in chest to neck deep water. Water will provide assistance with movement using the current and laminar flow while the buoyancy reduces weight bearing. Pt requires the viscosity of the water for resistance endurance  with strengthening exercises.  Greg Flowers  Adult PT Treatment:                                                DATE: 12/25/22 Therapeutic Exercise: Nu step L5 UELE during subjective  Paloff press 2x12 BIL, cues for posture STS 10# 8, 15# x5 cues for pacing Hooklying hip flexion isometrics x12 BIL cues for breath control Green band bridge x8 cues for comfortable ROM HEP review     [facility] Adult PT Treatment:                                                DATE: 12/20/22 Therapeutic Exercise: Nustep L5 UE/LE x 5 minutes  Palloff press green band double 10 x 2 each Black band pull downs x 20 Hip abduction at wall with opposite arm abduction isometric on wall  10 x 2 each  Isometric hip flexion isometrics  x 10  Opposite isometric hip flexion isometrics  x 10 STS 10 # x 15  Therapeutic Activity: FOTO      PATIENT EDUCATION:  Education details: see treatment Person educated: Patient Education method: Explanation, Demonstration, Tactile cues, Verbal cues, and Handouts Education comprehension: verbalized understanding, returned demonstration, verbal cues required, tactile cues required, and needs further education  HOME EXERCISE PROGRAM: Access Code: [facility] URL: [email or web address] Date: 11/28/2022 Prepared by: Joellyn Rued  Exercises - Supine Lower Trunk Rotation  - 1 x daily - 7 x weekly - 2 sets - 10 reps - Seated Hamstring Stretch  - 1 x daily - 7 x weekly - 3 sets - 30 sec  hold - Hooklying Clamshell with Resistance  - 1 x  daily - 7 x weekly - 2 sets - 10 reps - Supine Hip Adduction Isometric with Ball  - 1 x daily - 7 x weekly - 2 sets - 10 reps - 3 hold - Supine Posterior Pelvic Tilt  - 1 x daily - 7 x weekly - 2 sets - 10 reps - 3 hold - Supine Bridge  - 1 x daily - 7 x weekly - 2 sets - 10 reps - 3 hold  ASSESSMENT:  CLINICAL IMPRESSION: Patient presents to clinic and is interested in [facility].  Pt educated on TPDN and is closely monitored throughout session and then has 9 min of e-Stim to left gluteal and Left hip. Pt with reduced pain from 5/10 to 3/10 after RX.  Pt performing STS with 15 # KB.   Pt was closely monitored throughout session today and seemed to benefit with manual  estim and TPDN followed my exercise. Will continue torward progression of goals.   EVAL: Patient is a 67 y.o. male who was seen today for physical therapy evaluation and treatment for chronic low back pain that he attributes to overuse from his job with the [place] [employer]. He has undergone multiple surgical procedures including lumbar fusion and Lt THA, but continues to report ongoing high pain levels that have somewhat improved recently with medication. Upon assessment he is noted to have limited and painful trunk AROM, hip weakness, postural abnormalities, and scores below age-related normative values on the 6 minute walk test. He will benefit from skilled PT to address the above stated deficits in order to  optimize his function and assist in overall pain reduction.   OBJECTIVE IMPAIRMENTS: Abnormal gait, decreased activity tolerance, decreased endurance, decreased knowledge of condition, decreased mobility, difficulty walking, decreased ROM, decreased strength, increased fascial restrictions, impaired flexibility, improper body mechanics, postural dysfunction, and pain.   ACTIVITY LIMITATIONS: carrying, lifting, bending, standing, squatting, transfers, and locomotion level  PARTICIPATION LIMITATIONS: meal prep, cleaning, laundry,  shopping, community activity, and yard work  PERSONAL FACTORS: Age, Fitness, Time since onset of injury/illness/exacerbation, and 3+ comorbidities: see PMH/PSH above  are also affecting patient's functional outcome.   REHAB POTENTIAL: Fair see personal factors  CLINICAL DECISION MAKING: Evolving/moderate complexity  EVALUATION COMPLEXITY: Moderate   GOALS: Goals reviewed with patient? Yes  SHORT TERM GOALS: Target date: 12/11/2022    Patient will be independent and compliant with initial HEP.   Baseline: see above Goal status: MET- pt is completing his HEP correctly  2.  Patient will complete 5 x STS in </=13 seconds to improve ease of transfers.  Baseline: see above   12/11/22: 13" Goal status: MET  3.  Patient will demonstrate proper lifting mechanics without an increase in back pain.  Baseline: increased pain  Goal status: INITIAL   LONG TERM GOALS: Target date: 01/12/2023    Patient will score at least 47% on FOTO to signify clinically meaningful improvement in functional abilities.   Baseline: see above Goal status: INITIAL  2.  Patient will walk at least 1200 ft during 6 MWT to improve ability to navigate in the community.  Baseline: see above Goal status: INITIAL  3.  Patient will demonstrate 4+/5 bilateral hip strength to improve stability about the chain with prolonged walking/standing activity.  Baseline: see above Goal status: INITIAL  4.  Patient will be independent with advanced home program to assist in management of his chronic condition.  Baseline: see above Goal status: INITIAL    PLAN:  PT FREQUENCY: 1-2x/week  PT DURATION: 8 weeks  PLANNED INTERVENTIONS: Therapeutic exercises, Therapeutic activity, Neuromuscular re-education, Balance training, Gait training, Patient/Family education, Self Care, Aquatic Therapy, Dry Needling, Electrical stimulation, Cryotherapy, Moist heat, Manual therapy, and Re-evaluation.  PLAN FOR NEXT SESSION: review  and progress HEP prn; trunk mobility, hip stretching, core/hip strengthening, lifting mechanics.  Garen Lah, PT, A[facility] Certified Exercise Expert for the Aging Adult  01/07/23 3:40 PM Phone: [phone] Fax: [phone]

## 2023-01-04 ENCOUNTER — Ambulatory Visit: Payer: Federal, State, Local not specified - PPO

## 2023-01-04 DIAGNOSIS — M5459 Other low back pain: Secondary | ICD-10-CM

## 2023-01-04 DIAGNOSIS — R2689 Other abnormalities of gait and mobility: Secondary | ICD-10-CM

## 2023-01-04 DIAGNOSIS — M6281 Muscle weakness (generalized): Secondary | ICD-10-CM

## 2023-01-07 ENCOUNTER — Encounter: Payer: Self-pay | Admitting: Physical Therapy

## 2023-01-07 ENCOUNTER — Ambulatory Visit: Payer: Federal, State, Local not specified - PPO | Admitting: Physical Therapy

## 2023-01-07 DIAGNOSIS — M6281 Muscle weakness (generalized): Secondary | ICD-10-CM

## 2023-01-07 DIAGNOSIS — M7061 Trochanteric bursitis, right hip: Secondary | ICD-10-CM

## 2023-01-07 DIAGNOSIS — M5459 Other low back pain: Secondary | ICD-10-CM | POA: Diagnosis not present

## 2023-01-07 DIAGNOSIS — R2689 Other abnormalities of gait and mobility: Secondary | ICD-10-CM

## 2023-01-07 NOTE — Patient Instructions (Signed)

## 2023-01-08 NOTE — Therapy (Signed)
OUTPATIENT PHYSICAL THERAPY TREATMENT NOTE/Discharge       Patient Name: Greg Flowers MRN: [id number] DOB:1956/02/15,67 y.o., male Today's Date: 01/10/2023   END OF SESSION:  PT End of Session - 01/09/23 1340     Visit Number 13    Number of Visits 17    Date for PT Re-Evaluation 01/12/23    Authorization Type BC[employer]    PT Start Time 1335    PT Stop Time 1415    PT Time Calculation (min) 40 min    Activity Tolerance Patient tolerated treatment well    Behavior During Therapy WFL for tasks assessed/performed                Past Medical History:  Diagnosis Date   Anxiety    Calculus, kidney 2000   Sees Dr. Isabel Caprice, urology.    Cervical spondylosis 2004   sp surgery by Dr. Danielle Dess   Depression    GERD (gastroesophageal reflux disease)    Headache(784.0)    Chronic, on vicodin 5 TID for this.    Hepatitis C    Has occasional visits with Dr. Randa Evens GI, failed RX in 2000.  Liver Biopsy 9/06: Minimally active hepatitis consistent with hepatitis C, minimal necroinflamtory activity grade 1, no incrased fibrosis stage 0.    Herpes labialis    HERPES LABIALIS 04/04/2006   Qualifier: Diagnosis of  By: Aundria Rud MD, Roseanne Reno     History of kidney stones    Hypertension    Pneumonia    Renal stone    Spondylolisthesis of lumbar region    Thalassemia    HGB 9/09 14.4 wtih MCV 72.6   Viral upper respiratory illness 04/12/2021   Wears glasses    Past Surgical History:  Procedure Laterality Date   ANTERIOR CERVICAL DECOMP/DISCECTOMY FUSION N/A 11/21/2017   Procedure: ANTERIOR CERVICAL DECOMPRESSION FUSION, CERVICAL THREE-FOUR, CERVICAL FOUR-FIVE WITH INSTRUMENTATION AND ALLOGRAFT;  Surgeon: Estill Bamberg, MD;  Location: [facility] OR;  Service: Orthopedics;  Laterality: N/A;  ANTERIOR CERVICAL DECOMPRESSION FUSION, CERVICAL THREE-FOUR, CERVICAL FOUR-FIVE WITH INSTRUMENTATION AND ALLOGRAFT   BACK SURGERY  2017   L4-L5 PLATE/SCREWS   CERVICAL DISCECTOMY  2004   Dr Danielle Dess    COLONOSCOPY     LITHOTRIPSY  2004   Dr Logan Bores   NASAL SEPTUM SURGERY  2007   TONSILLECTOMY     TOTAL HIP ARTHROPLASTY Left 11/18/2018   Procedure: Left Anterior Hip Arthroplasty;  Surgeon: Marcene Corning, MD;  Location: [facility];  Service: Orthopedics;  Laterality: Left;   Patient Active Problem List   Diagnosis Date Noted   Lateral epicondylitis of right elbow 03/02/2022   Hyperlipidemia 12/30/2020   Alpha thalassaemia minor 05/25/2020   Healthcare maintenance 05/25/2020   Acute gout due to renal impairment involving toe 05/20/2019   Neuropathy 05/18/2019   Primary localized osteoarthritis of left hip 11/18/2018   Primary osteoarthritis of left hip 11/18/2018   Microcytic anemia 08/13/2018   Disc disease, degenerative, cervical 11/01/2017   Liver fibrosis 04/30/2017   NSAID long-term use 01/09/2016   Chronic back pain secondary to DJD of cervical, thoracic and lumbar spine and disc herniation 01/09/2016   Osteoarthritis of both knees 11/02/2011   DISORDER, DEPRESSIVE NEC 09/25/2006   Chronic hepatitis C without hepatic coma (HCC) 02/22/2006   Essential hypertension 02/22/2006    PCP: Grayce Sessions, NP  REFERRING PROVIDER: Trena Platt, DO   REFERRING DIAG:  Diagnosis  M96.1 (ICD-10-CM) - Postlaminectomy syndrome, not elsewhere classified    Rationale for  Evaluation and Treatment: Rehabilitation  THERAPY DIAG:  Other low back pain  Muscle weakness (generalized)  Other abnormalities of gait and mobility  ONSET DATE: chronic   SUBJECTIVE:                                                                                                                                                                                           SUBJECTIVE STATEMENT: Pt reports he is looking into continuing an aquatics program in the community with DC occurring today.  EVAL: "I hurt all over. I had 2 neck surgeries, a back surgery, and hip surgery." He has completed PT following  each surgical intervention stating this has been helpful in reducing his pain. He reports aching all over especially when he first wakes in the morning. He localizes his back pain to the center of his low back and can shoot down the posterior thighs with prolonged standing. He reports chronic pain that has been ongoing for 20+ years attributed to his work at the [employer]. He was recently seen in pain management and was prescribed pain medication and muscle relaxers, which have been somewhat helpful in reducing his pain. He reports neuropathy in bilateral feet Lt>Rt and thinks it could be due to his hip surgery. He denies any changes in bowel/bladder.   PAIN:  Are you having pain? Yes: NPRS scale: 5 (at worst 10)/10 Pain location: middle of low back Pain description: ache,sharp Aggravating factors: first thing in AM, transferring from sit to stand, lifting,twisting, prolonged standing  Relieving factors: medication, PT previously  PERTINENT HISTORY:  Lumbar fusion 2017 Lt THA 2020  PRECAUTIONS: None  WEIGHT BEARING RESTRICTIONS: No  FALLS:  Has patient fallen in last 6 months? No  LIVING ENVIRONMENT: Lives with: lives with their spouse Lives in: House/apartment Stairs: Yes: Internal: 16 steps; on right going up Has following equipment at home: Single point cane  OCCUPATION: retired   PLOF: Independent  PATIENT GOALS: "get a little more flexible and help ease the pain."   OBJECTIVE: (objective measures completed at initial evaluation unless otherwise dated)   DIAGNOSTIC FINDINGS:  Lumbar X-ray: FINDINGS: No compression deformities. No spondylolisthesis. No motion with flexion and extension to suggest instability. Postop changes L4-5 fusion and discectomy. L5-S1 disc space narrowing with grade 1 L5 retrolisthesis. Endplate sclerosis. Lower thoracic degenerative changes identified as well.   IMPRESSION: Postop and degenerative changes.  PATIENT SURVEYS:  FOTO 38%  function to 47% predicted  FOTO 53%  SCREENING FOR RED FLAGS: Bowel or bladder incontinence: No Cauda equina syndrome: No   COGNITION: Overall cognitive status: Within functional limits for tasks assessed  SENSATION: WFL  MUSCLE LENGTH: Hamstrings: moderate tightness bilaterally   POSTURE: decreased lumbar lordosis  PALPATION: TTP bilateral lumbar paraspinals; Lt gluteals;  Pain before restriction PAIVM L-spine  LUMBAR ROM:   AROM eval  Flexion Full LBP  Extension 50% limited pain in buttocks  Right lateral flexion 50% limited  Left lateral flexion 50% limited   Right rotation 25% limited  Left rotation 25% limited    (Blank rows = not tested)  LOWER EXTREMITY ROM:     Active  Right eval Left eval  Hip flexion 99 90  Hip extension    Hip abduction    Hip adduction    Hip internal rotation    Hip external rotation    Knee flexion    Knee extension    Ankle dorsiflexion    Ankle plantarflexion    Ankle inversion    Ankle eversion     (Blank rows = not tested)  LOWER EXTREMITY MMT:    MMT Right eval Left eval Rt 01/09/23 LT 01/09/23  Hip flexion 4 4 5 5   Hip extension 4 4 4+ 4+  Hip abduction 4- 4- 4+ 4+  Hip adduction      Hip internal rotation      Hip external rotation   4+ 4+  Knee flexion 5 5    Knee extension 5 5    Ankle dorsiflexion 5 5    Ankle plantarflexion 5 5    Ankle inversion      Ankle eversion 5 5     (Blank rows = not tested)  LUMBAR SPECIAL TESTS:  (-) SLR  FUNCTIONAL TESTS:  5 x STS: 16 seconds  : 1,013 ft   GAIT: Distance walked: 6 MWT Assistive device utilized: None Level of assistance: Complete Independence Comments: antalgic Lt; Rt foot in ER during stance, decreased step length   [facility] Adult PT Treatment:                                                DATE: 01/09/23 Therapeutic Exercise: - Supine Lower Trunk Rotation 10 reps - Seated Hamstring Stretch  x2 30 sec  hold - Hooklying Clamshell with  Resistance  10 reps - 5 hold - Supine Hip Adduction Isometric with Ball 10 reps - 5 hold - Supine Posterior Pelvic Tilt 10 reps - 3 hold - Supine Bridge 10 reps - 5 hold Final land and aquatic HEPs provided Therapeutic Activity: 6 min walk STS x10  [facility] Adult PT Treatment:                                                DATE: 01-07-23 Therapeutic Exercise: 15 # KB  3 x 8 STS  5 Deep squats Child pose after TPDN  Piriformis stretch after TPDN  30 sec x 2 on Left  1/2 Lunge  8 x 1 on R and L Manual Therapy: STW Left gluteals STW Myofascial release of Left QL Trigger Point Dry-Needling performed     by Garen Lah Treatment instructions: Expect mild to moderate muscle soreness. S/S of pneumothorax if dry needled over a lung field, and to seek immediate medical attention should they occur. Patient verbalized understanding of these instructions and education.  Patient Consent Given: Yes Education handout provided: Previously provided Muscles treated:Left Gluteals and piriformis.  Lumbar L-3 to L-5 bil Left posterior greater trochanter Electrical stimulation performed: yes Parameters: 25 pps and intensity to increase every 2 minutes for 9 minutes Treatment response/outcome: twitch response noted, pt noted relief  [facility] Adult PT Treatment:                                                DATE: 01-02-23 Pt enters building ambulating independently. Treatment took place in water 3.8 to  4 ft 8 in. deep depending upon activity.  Pt entered and exited the pool via stair and handrails independently.    Acclimating to water by walking back and forth and side stepping  with use of aquatic flotation rainbow DB  Aquatic Exercise:   Squats x 25 Hip abduction/adduction x10 BIL Hip ext/flex with knee straight x20 BIL March to kick x20 BIL Heel/toe raises x25 Gentle lumbar rotation with noodle x1' Cat/Camel using yellow pool noodle x15 Lunge across pool width x2 laps Sidestepping with rainbow  DB shoulder abd/add x2 laps With UE on submerged pool seat,at 90 flexion, one legged hip extension 10 x R and L Runners stretch bottom step x30" BIL Hamstring stretch bottom step x30" BIL Back against wall yellow DB shoulder flex/ext x20 Supine  aquatic abdominal hollowing  barbell submerged for abdominal engagement needs PT assist Combo prone to supine with legs extended for 3 minutes to encourage active abdominal engagement. needs PT assist Supine  floating torso twist for External and Internal obliques  Single limb balance in water without flotation device Single limb balance in water with added perturbations by therapist    Pt requires the buoyancy of water for active assisted exercises with buoyancy supported for strengthening and AROM exercises. Hydrostatic pressure also supports joints by unweighting joint load by at least 50 % in 3-4 feet depth water. 80% in chest to neck deep water. Water will provide assistance with movement using the current and laminar flow while the buoyancy reduces weight bearing. Pt requires the viscosity of the water for resistance endurance  with strengthening exercises.   PATIENT EDUCATION:  Education details: see treatment Person educated: Patient Education method: Explanation, Demonstration, Tactile cues, Verbal cues, and Handouts Education comprehension: verbalized understanding, returned demonstration, verbal cues required, tactile cues required, and needs further education  HOME EXERCISE PROGRAM: Access Code: [facility] URL: [email or web address] Date: 01/09/2023 Prepared by: Joellyn Rued  Exercises - Supine Lower Trunk Rotation  - 1 x daily - 7 x weekly - 2 sets - 10 reps - Seated Hamstring Stretch  - 1 x daily - 7 x weekly - 3 sets - 30 sec  hold - Hooklying Clamshell with Resistance  - 1 x daily - 7 x weekly - 2 sets - 10 reps - 5 hold - Supine Hip Adduction Isometric with Ball  - 1 x daily - 7 x weekly - 2 sets - 10 reps - 5  hold - Supine Posterior Pelvic Tilt  - 1 x daily - 7 x weekly - 2 sets - 10 reps - 3 hold - Supine Bridge  - 1 x daily - 7 x weekly - 2 sets - 10 reps - 5 hold  ASSESSMENT:  CLINICAL IMPRESSION Pt completed his last PT appt today. With both land and aquatic PT sessions, he has made  good progress re: pain management and functional mobility meeting all set PT LT goals. Pt is Ind with a HEP to maintain his achieved LOF. Pt is in agreement for DC at this time to a HEP.  EVAL: Patient is a 67 y.o. male who was seen today for physical therapy evaluation and treatment for chronic low back pain that he attributes to overuse from his job with the [place] [employer]. He has undergone multiple surgical procedures including lumbar fusion and Lt THA, but continues to report ongoing high pain levels that have somewhat improved recently with medication. Upon assessment he is noted to have limited and painful trunk AROM, hip weakness, postural abnormalities, and scores below age-related normative values on the 6 minute walk test. He will benefit from skilled PT to address the above stated deficits in order to optimize his function and assist in overall pain reduction.   OBJECTIVE IMPAIRMENTS: Abnormal gait, decreased activity tolerance, decreased endurance, decreased knowledge of condition, decreased mobility, difficulty walking, decreased ROM, decreased strength, increased fascial restrictions, impaired flexibility, improper body mechanics, postural dysfunction, and pain.   ACTIVITY LIMITATIONS: carrying, lifting, bending, standing, squatting, transfers, and locomotion level  PARTICIPATION LIMITATIONS: meal prep, cleaning, laundry, shopping, community activity, and yard work  PERSONAL FACTORS: Age, Fitness, Time since onset of injury/illness/exacerbation, and 3+ comorbidities: see PMH/PSH above  are also affecting patient's functional outcome.   REHAB POTENTIAL: Fair see personal factors  CLINICAL DECISION  MAKING: Evolving/moderate complexity  EVALUATION COMPLEXITY: Moderate   GOALS: Goals reviewed with patient? Yes  SHORT TERM GOALS: Target date: 12/11/2022    Patient will be independent and compliant with initial HEP.   Baseline: see above Goal status: MET- pt is completing his HEP correctly  2.  Patient will complete 5 x STS in </=13 seconds to improve ease of transfers.  Baseline: see above   12/11/22: 13" Goal status: MET  3.  Patient will demonstrate proper lifting mechanics without an increase in back pain.  Baseline: increased pain  Goal status: INITIAL   LONG TERM GOALS: Target date: 01/12/2023    Patient will score at least 47% on FOTO to signify clinically meaningful improvement in functional abilities.   Baseline: see above Goal status: 01/02/23= 57% MET  2.  Patient will walk at least 1200 ft during 6 MWT to improve ability to navigate in the community.  Baseline: see above Goal status: 01/09/23= 1235" MET  3.  Patient will demonstrate 4+/5 bilateral hip strength to improve stability about the chain with prolonged walking/standing activity.  Baseline: see above Goal status: 01/09/23= MET- see flow sheets  4.  Patient will be independent with advanced home program to assist in management of his chronic condition.  Baseline: see above Goal status: 01/09/23= MET    PLAN:  PT FREQUENCY: 1-2x/week  PT DURATION: 8 weeks  PLANNED INTERVENTIONS: Therapeutic exercises, Therapeutic activity, Neuromuscular re-education, Balance training, Gait training, Patient/Family education, Self Care, Aquatic Therapy, Dry Needling, Electrical stimulation, Cryotherapy, Moist heat, Manual therapy, and Re-evaluation.  PLAN FOR NEXT SESSION:   PHYSICAL THERAPY DISCHARGE SUMMARY  Visits from Start of Care: 13  Current functional level related to goals / functional outcomes: See clinical impression and PT goals    Remaining deficits: See clinical impression and PT goals     Education / Equipment: HEP. Pt Ed.   Patient agrees to discharge. Patient goals were met. Patient is being discharged due to being pleased with the current functional level.   Erminia Mcnew MS, PT 01/10/23  10:40 AM

## 2023-01-09 ENCOUNTER — Ambulatory Visit: Payer: Federal, State, Local not specified - PPO

## 2023-01-09 DIAGNOSIS — M5459 Other low back pain: Secondary | ICD-10-CM | POA: Diagnosis not present

## 2023-01-09 DIAGNOSIS — R2689 Other abnormalities of gait and mobility: Secondary | ICD-10-CM

## 2023-01-09 DIAGNOSIS — M6281 Muscle weakness (generalized): Secondary | ICD-10-CM

## 2023-01-24 ENCOUNTER — Other Ambulatory Visit (INDEPENDENT_AMBULATORY_CARE_PROVIDER_SITE_OTHER): Payer: Self-pay | Admitting: Primary Care

## 2023-01-24 ENCOUNTER — Other Ambulatory Visit: Payer: Self-pay | Admitting: Student

## 2023-01-24 NOTE — Telephone Encounter (Signed)
Requested Prescriptions  Pending Prescriptions Disp Refills   amLODipine (NORVASC) 10 MG tablet [Pharmacy Med Name: AMLODIPINE BESYLATE 10 MG TAB] 90 tablet 0    Sig: TAKE 1 TABLET BY MOUTH EVERY DAY     Cardiovascular: Calcium Channel Blockers 2 Failed - 01/24/2023 10:40 AM      Failed - Valid encounter within last 6 months    Recent Outpatient Visits           6 months ago Arthralgia, unspecified joint   [facility] Grayce Sessions, NP              Passed - Last BP in normal range    BP Readings from Last 1 Encounters:  09/24/22 136/88         Passed - Last Heart Rate in normal range    Pulse Readings from Last 1 Encounters:  09/24/22 76

## 2023-01-25 ENCOUNTER — Encounter (INDEPENDENT_AMBULATORY_CARE_PROVIDER_SITE_OTHER): Payer: Self-pay | Admitting: Primary Care

## 2023-03-11 ENCOUNTER — Encounter (INDEPENDENT_AMBULATORY_CARE_PROVIDER_SITE_OTHER): Payer: Self-pay | Admitting: Primary Care

## 2023-03-21 ENCOUNTER — Telehealth (INDEPENDENT_AMBULATORY_CARE_PROVIDER_SITE_OTHER): Payer: Self-pay | Admitting: Primary Care

## 2023-03-21 NOTE — Telephone Encounter (Signed)
Pt came in with paperwork in hand. Pt was advised that once paperwork is ready we will reach out to pt to have him pick it up.

## 2023-03-21 NOTE — Telephone Encounter (Signed)
Provider was given form and was filled out and form was given back to pt

## 2023-03-21 NOTE — Telephone Encounter (Signed)
Pt came in with paperwork in hand. And needed it filled out by nurse or provider.  Pt was advised that once paperwork is ready we will reach out to pt to have him pick it up.

## 2023-04-17 ENCOUNTER — Other Ambulatory Visit: Payer: Self-pay | Admitting: Internal Medicine

## 2023-04-22 ENCOUNTER — Other Ambulatory Visit: Payer: Self-pay | Admitting: Primary Care

## 2023-05-18 ENCOUNTER — Other Ambulatory Visit (INDEPENDENT_AMBULATORY_CARE_PROVIDER_SITE_OTHER): Payer: Self-pay | Admitting: Primary Care

## 2023-05-18 DIAGNOSIS — I10 Essential (primary) hypertension: Secondary | ICD-10-CM

## 2023-05-20 NOTE — Telephone Encounter (Signed)
Call patient to advise that he will need appointment for refills on His cozaar 100 mg. Unable to reach patient VM Left. When patient returns call please schedule with the provider.

## 2023-05-20 NOTE — Telephone Encounter (Signed)
Pt need an appt.  Forwarded to [facility] clinical pool

## 2023-05-22 ENCOUNTER — Other Ambulatory Visit (INDEPENDENT_AMBULATORY_CARE_PROVIDER_SITE_OTHER): Payer: Self-pay | Admitting: Primary Care

## 2023-05-22 DIAGNOSIS — M10379 Gout due to renal impairment, unspecified ankle and foot: Secondary | ICD-10-CM

## 2023-05-22 NOTE — Telephone Encounter (Signed)
 Requested medication (s) are due for refill today: na   Requested medication (s) are on the active medication list: yes   Last refill:  08/06/22 # 45 1 refills  Future visit scheduled: no   Notes to clinic:  last ordered by Mliss Foot , MD 08/06/22. Do you want to order Rx?     Requested Prescriptions  Pending Prescriptions Disp Refills   allopurinol  (ZYLOPRIM ) 100 MG tablet [Pharmacy Med Name: ALLOPURINOL  100 MG TABLET] 45 tablet 1    Sig: TAKE 1/2 TABLET BY MOUTH DAILY     Endocrinology:  Gout Agents - allopurinol  Passed - 05/22/2023  2:29 PM      Passed - Uric Acid in normal range and within 360 days    Uric Acid  Date Value Ref Range Status  07/26/2022 7.6 3.8 - 8.4 mg/dL Final    Comment:               Therapeutic target for gout patients: <6.0         Passed - Cr in normal range and within 360 days    Creatinine  Date Value Ref Range Status  06/17/2019 1.37 (H) 0.61 - 1.24 mg/dL Final   Creat  Date Value Ref Range Status  01/16/2017 1.24 0.70 - 1.25 mg/dL Final    Comment:    For patients >59 years of age, the reference limit for Creatinine is approximately 13% higher for people identified as African-American. .    Creatinine, Ser  Date Value Ref Range Status  07/26/2022 1.10 0.76 - 1.27 mg/dL Final   Creatinine,U  Date Value Ref Range Status  11/23/2013 116.45 mg/dL Final    Comment:       Cutoff Values for Urine Drug Screen, Pain Mgmt          Drug Class           Cutoff (ng/mL)          Amphetamines             500          Barbiturates             200          Cocaine  Metabolites      150          Benzodiazepines          200          Methadone                300          Opiates                  300          Phencyclidine             25          Propoxyphene             300          Marijuana Metabolites     50    For medical purposes only.   Creatinine, Urine  Date Value Ref Range Status  08/24/2013 301.55 >20.0 mg/dL Final    Comment:     * (PPS) Presumptive positive screen result to be verified by         quantitative LC/MS or GC/MS confirmation testing.         Passed - Valid encounter within last 12 months    Recent Outpatient  Visits           10 months ago Arthralgia, unspecified joint   [facility] Celestia Rosaline SQUIBB, NP              Passed - CBC within normal limits and completed in the last 12 months    WBC  Date Value Ref Range Status  07/26/2022 8.1 3.4 - 10.8 x10E3/uL Final  06/25/2021 13.6 (H) 4.0 - 10.5 K/uL Final   RBC  Date Value Ref Range Status  07/26/2022 5.24 4.14 - 5.80 x10E6/uL Final  06/25/2021 5.75 4.22 - 5.81 MIL/uL Final   Hemoglobin  Date Value Ref Range Status  07/26/2022 11.9 (L) 13.0 - 17.7 g/dL Final   Hematocrit  Date Value Ref Range Status  07/26/2022 36.8 (L) 37.5 - 51.0 % Final   MCHC  Date Value Ref Range Status  07/26/2022 32.3 31.5 - 35.7 g/dL Final  96/87/7976 67.7 30.0 - 36.0 g/dL Final   [facility]  Date Value Ref Range Status  07/26/2022 22.7 (L) 26.6 - 33.0 pg Final  06/25/2021 22.1 (L) 26.0 - 34.0 pg Final   MCV  Date Value Ref Range Status  07/26/2022 70 (L) 79 - 97 fL Final   No results found for: PLTCOUNTKUC, LABPLAT, POCPLA RDW  Date Value Ref Range Status  07/26/2022 14.6 11.6 - 15.4 % Final

## 2023-05-22 NOTE — Telephone Encounter (Signed)
 Will forward to provider

## 2023-06-03 ENCOUNTER — Encounter (INDEPENDENT_AMBULATORY_CARE_PROVIDER_SITE_OTHER): Payer: Self-pay | Admitting: Primary Care

## 2023-06-04 ENCOUNTER — Ambulatory Visit (INDEPENDENT_AMBULATORY_CARE_PROVIDER_SITE_OTHER): Payer: Federal, State, Local not specified - PPO | Admitting: Primary Care

## 2023-06-04 ENCOUNTER — Encounter (INDEPENDENT_AMBULATORY_CARE_PROVIDER_SITE_OTHER): Payer: Self-pay | Admitting: Primary Care

## 2023-06-04 VITALS — BP 132/85 | HR 74 | Resp 16 | Ht 68.5 in | Wt 208.6 lb

## 2023-06-04 DIAGNOSIS — E66811 Obesity, class 1: Secondary | ICD-10-CM

## 2023-06-04 DIAGNOSIS — G8929 Other chronic pain: Secondary | ICD-10-CM

## 2023-06-04 DIAGNOSIS — Z6834 Body mass index (BMI) 34.0-34.9, adult: Secondary | ICD-10-CM

## 2023-06-04 DIAGNOSIS — M545 Low back pain, unspecified: Secondary | ICD-10-CM

## 2023-06-04 DIAGNOSIS — E782 Mixed hyperlipidemia: Secondary | ICD-10-CM

## 2023-06-04 DIAGNOSIS — K2100 Gastro-esophageal reflux disease with esophagitis, without bleeding: Secondary | ICD-10-CM | POA: Diagnosis not present

## 2023-06-04 DIAGNOSIS — Z760 Encounter for issue of repeat prescription: Secondary | ICD-10-CM | POA: Diagnosis not present

## 2023-06-04 DIAGNOSIS — I10 Essential (primary) hypertension: Secondary | ICD-10-CM | POA: Diagnosis not present

## 2023-06-04 DIAGNOSIS — E6609 Other obesity due to excess calories: Secondary | ICD-10-CM

## 2023-06-04 MED ORDER — PANTOPRAZOLE SODIUM 40 MG PO TBEC
40.0000 mg | DELAYED_RELEASE_TABLET | Freq: Every day | ORAL | 1 refills | Status: AC
Start: 2023-06-04 — End: ?

## 2023-06-04 MED ORDER — LOSARTAN POTASSIUM 100 MG PO TABS
100.0000 mg | ORAL_TABLET | Freq: Every day | ORAL | 1 refills | Status: AC
Start: 2023-06-04 — End: ?

## 2023-06-04 MED ORDER — DULOXETINE HCL 30 MG PO CPEP
ORAL_CAPSULE | ORAL | 1 refills | Status: DC
Start: 2023-06-04 — End: 2023-12-01

## 2023-06-04 NOTE — Progress Notes (Signed)
[facility]  Greg Flowers, is a 68 y.o. male  ZOX:[id number]  [id number]  DOB - [birth year]  Medication refills       Subjective:   Greg Flowers is a 68 y.o. male here today for requesting medication refills.  He recently got over a stomach virus which probably came from visiting his grandchildren over the weekend.  Symptoms were nausea, vomiting and diarrhea this has now self resolved but left weak.  Appetite has been suppressed and now starting to try to slowly eat soft foods.   No problems updated.  Comprehensive ROS Pertinent positive and negative noted in HPI   No Known Allergies  Past Medical History:  Diagnosis Date   Anxiety    Calculus, kidney 2000   Sees Dr. Isabel Caprice, urology.    Cervical spondylosis 2004   sp surgery by Dr. Danielle Dess   Depression    GERD (gastroesophageal reflux disease)    Headache(784.0)    Chronic, on vicodin 5 TID for this.    Hepatitis C    Has occasional visits with Dr. Randa Evens GI, failed RX in 2000.  Liver Biopsy 9/06: Minimally active hepatitis consistent with hepatitis C, minimal necroinflamtory activity grade 1, no incrased fibrosis stage 0.    Herpes labialis    HERPES LABIALIS 04/04/2006   Qualifier: Diagnosis of  By: Aundria Rud MD, Roseanne Reno     History of kidney stones    Hypertension    Pneumonia    Renal stone    Spondylolisthesis of lumbar region    Thalassemia    HGB 9/09 14.4 wtih MCV 72.6   Viral upper respiratory illness 04/12/2021   Wears glasses     Current Outpatient Medications on File Prior to Visit  Medication Sig Dispense Refill   acetaminophen (TYLENOL) 650 MG CR tablet Take 650 mg by mouth every 8 (eight) hours as needed for pain.     allopurinol (ZYLOPRIM) 100 MG tablet TAKE 1/2 TABLET BY MOUTH DAILY 45 tablet 1   amLODipine (NORVASC) 10 MG tablet TAKE 1 TABLET BY MOUTH EVERY DAY 90 tablet 0   atenolol (TENORMIN) 50 MG tablet TAKE 1 TABLET BY MOUTH EVERY DAY 90 tablet 2   atorvastatin (LIPITOR)  10 MG tablet Take 1 tablet (10 mg total) by mouth daily. 90 tablet 3   promethazine (PHENERGAN) 12.5 MG suppository Place 12.5 mg rectally every 8 (eight) hours as needed for nausea or vomiting.     Tamsulosin HCl (FLOMAX) 0.4 MG CAPS Take 0.8 mg by mouth daily.      No current facility-administered medications on file prior to visit.   Health Maintenance  Topic Date Due   Stool Blood Test  07/07/2021   Colon Cancer Screening  11/14/2023   DTaP/Tdap/Td vaccine (2 - Td or Tdap) 01/18/2026   Pneumonia Vaccine (3 of 3 - PPSV23 or PCV20) 07/11/2026   Flu Shot  Completed   COVID-19 Vaccine  Completed   Hepatitis C Screening  Completed   Zoster (Shingles) Vaccine  Completed   HPV Vaccine  Aged Out    Objective:    Physical Exam Vitals reviewed.  Constitutional:      Appearance: He is obese.  HENT:     Head: Normocephalic.     Right Ear: Tympanic membrane and external ear normal.     Left Ear: Tympanic membrane and external ear normal.     Nose: Nose normal.  Eyes:     Extraocular Movements: Extraocular movements intact.     Pupils: Pupils  are equal, round, and reactive to light.  Cardiovascular:     Rate and Rhythm: Normal rate and regular rhythm.  Pulmonary:     Effort: Pulmonary effort is normal.     Breath sounds: Normal breath sounds.  Abdominal:     General: Bowel sounds are normal. There is distension.     Palpations: Abdomen is soft.  Musculoskeletal:        General: Normal range of motion.     Cervical back: Normal range of motion.  Skin:    General: Skin is warm and dry.  Neurological:     Mental Status: He is oriented to person, place, and time.  Psychiatric:        Mood and Affect: Mood normal.        Behavior: Behavior normal.        Thought Content: Thought content normal.        Judgment: Judgment normal.      Assessment & Plan  Diagnoses and all orders for this visit:  Medication refill -     pantoprazole (PROTONIX) 40 MG tablet; Take 1 tablet (40  mg total) by mouth daily. -     losartan (COZAAR) 100 MG tablet; Take 1 tablet (100 mg total) by mouth daily. -     DULoxetine (CYMBALTA) 30 MG capsule; TAKE 1 CAPSULE BY MOUTH EVERY DAY  Essential hypertension BP goal - < 140/90  Explained that having normal blood pressure is the goal and medications are helping to get to goal and maintain normal blood pressure. DIET: Limit salt intake, read nutrition labels to check salt content, limit fried and high fatty foods  Avoid using multisymptom OTC cold preparations that generally contain sudafed which can rise BP. Consult with pharmacist on best cold relief products to use for persons with HTN EXERCISE Discussed incorporating exercise such as walking - 30 minutes most days of the week and can do in 10 minute intervals    -     losartan (COZAAR) 100 MG tablet; Take 1 tablet (100 mg total) by mouth daily.  Gastroesophageal reflux disease with esophagitis without hemorrhage Discussed eating small frequent meal, reduction in acidic foods, fried foods ,spicy foods, alcohol caffeine and tobacco and certain medications. Avoid laying down after eating 50mins-1hour, elevated head of the bed.  -     pantoprazole (PROTONIX) 40 MG tablet; Take 1 tablet (40 mg total) by mouth daily.  Chronic bilateral low back pain, unspecified whether sciatica present -     DULoxetine (CYMBALTA) 30 MG capsule; TAKE 1 CAPSULE BY MOUTH EVERY DAY  Patient have been counseled extensively about nutrition and exercise. Other issues discussed during this visit include: low cholesterol diet, weight control and daily exercise, foot care, annual eye examinations at Ophthalmology, importance of adherence with medications and regular follow-up. We also discussed long term complications of uncontrolled diabetes and hypertension.   Return in about 6 months (around 12/02/2023).  The patient was given clear instructions to go to ER or return to medical center if symptoms don't improve,  worsen or new problems develop. The patient verbalized understanding. The patient was told to call to get lab results if they haven't heard anything in the next week.   This note has been created with Education officer, environmental. Any transcriptional errors are unintentional.   Grayce Sessions, NP 06/04/2023, 4:03 PM

## 2023-06-04 NOTE — Patient Instructions (Signed)
Why should you not take PPIs long-term? Long-term PPI (omeprazole, Protonix, Nexium ) use has been associated with an increased risk of several adverse outcomes in observational studies, including bone fractures, vitamin B12, magnesium or iron deficiency, Clostridium difficile infection and community-acquired pneumonia.  

## 2023-06-05 LAB — CMP14+EGFR
ALT: 13 [IU]/L (ref 0–44)
AST: 14 [IU]/L (ref 0–40)
Albumin: 4.1 g/dL (ref 3.9–4.9)
Alkaline Phosphatase: 71 [IU]/L (ref 44–121)
BUN/Creatinine Ratio: 13 (ref 10–24)
BUN: 16 mg/dL (ref 8–27)
Bilirubin Total: 0.2 mg/dL (ref 0.0–1.2)
CO2: 30 mmol/L — ABNORMAL HIGH (ref 20–29)
Calcium: 9.5 mg/dL (ref 8.6–10.2)
Chloride: 99 mmol/L (ref 96–106)
Creatinine, Ser: 1.2 mg/dL (ref 0.76–1.27)
Globulin, Total: 2.6 g/dL (ref 1.5–4.5)
Glucose: 88 mg/dL (ref 70–99)
Potassium: 4.5 mmol/L (ref 3.5–5.2)
Sodium: 140 mmol/L (ref 134–144)
Total Protein: 6.7 g/dL (ref 6.0–8.5)
eGFR: 65 mL/min/{1.73_m2} (ref >59)

## 2023-06-05 LAB — CBC WITH DIFFERENTIAL/PLATELET
Basophils Absolute: 0.1 10*3/uL (ref 0.0–0.2)
Basos: 1 %
EOS (ABSOLUTE): 0.2 10*3/uL (ref 0.0–0.4)
Eos: 3 %
Hematocrit: 40.1 % (ref 37.5–51.0)
Hemoglobin: 12.3 g/dL — ABNORMAL LOW (ref 13.0–17.7)
Immature Grans (Abs): 0 10*3/uL (ref 0.0–0.1)
Immature Granulocytes: 0 %
Lymphocytes Absolute: 3 10*3/uL (ref 0.7–3.1)
Lymphs: 35 %
MCH: 22.4 pg — ABNORMAL LOW (ref 26.6–33.0)
MCHC: 30.7 g/dL — ABNORMAL LOW (ref 31.5–35.7)
MCV: 73 fL — ABNORMAL LOW (ref 79–97)
Monocytes Absolute: 0.6 10*3/uL (ref 0.1–0.9)
Monocytes: 8 %
Neutrophils Absolute: 4.5 10*3/uL (ref 1.4–7.0)
Neutrophils: 53 %
Platelets: 298 10*3/uL (ref 150–450)
RBC: 5.5 x10E6/uL (ref 4.14–5.80)
RDW: 14.5 % (ref 11.6–15.4)
WBC: 8.4 10*3/uL (ref 3.4–10.8)

## 2023-06-05 LAB — LIPID PANEL
Chol/HDL Ratio: 4.1 {ratio} (ref 0.0–5.0)
Cholesterol, Total: 150 mg/dL (ref 100–199)
HDL: 37 mg/dL — ABNORMAL LOW (ref >39)
LDL Chol Calc (NIH): 92 mg/dL (ref 0–99)
Triglycerides: 117 mg/dL (ref 0–149)
VLDL Cholesterol Cal: 21 mg/dL (ref 5–40)

## 2023-06-06 ENCOUNTER — Encounter (INDEPENDENT_AMBULATORY_CARE_PROVIDER_SITE_OTHER): Payer: Self-pay | Admitting: Primary Care

## 2023-06-07 ENCOUNTER — Other Ambulatory Visit: Payer: Self-pay | Admitting: Internal Medicine

## 2023-06-24 ENCOUNTER — Other Ambulatory Visit (INDEPENDENT_AMBULATORY_CARE_PROVIDER_SITE_OTHER): Payer: Self-pay | Admitting: Primary Care

## 2023-08-16 ENCOUNTER — Other Ambulatory Visit: Payer: Self-pay | Admitting: Primary Care

## 2023-08-16 DIAGNOSIS — I10 Essential (primary) hypertension: Secondary | ICD-10-CM

## 2023-09-13 ENCOUNTER — Ambulatory Visit (HOSPITAL_COMMUNITY)
Admission: EM | Admit: 2023-09-13 | Discharge: 2023-09-13 | Disposition: A | Discharge status: Home / Self Care | Attending: Emergency Medicine | Admitting: Emergency Medicine

## 2023-09-13 ENCOUNTER — Ambulatory Visit (INDEPENDENT_AMBULATORY_CARE_PROVIDER_SITE_OTHER)

## 2023-09-13 ENCOUNTER — Encounter (HOSPITAL_COMMUNITY): Payer: Self-pay | Admitting: *Deleted

## 2023-09-13 DIAGNOSIS — R051 Acute cough: Secondary | ICD-10-CM

## 2023-09-13 DIAGNOSIS — J209 Acute bronchitis, unspecified: Secondary | ICD-10-CM

## 2023-09-13 MED ORDER — PREDNISONE 20 MG PO TABS: 40.0000 mg | ORAL_TABLET | Freq: Every day | ORAL | 0 refills | Status: AC

## 2023-09-13 MED ORDER — ALBUTEROL SULFATE HFA 108 (90 BASE) MCG/ACT IN AERS: 1.0000 | INHALATION_SPRAY | Freq: Four times a day (QID) | RESPIRATORY_TRACT | 0 refills | Status: AC | PRN

## 2023-09-13 MED ORDER — HYDROCODONE BIT-HOMATROP MBR 5-1.5 MG/5ML PO SOLN: 5.0000 mL | Freq: Four times a day (QID) | ORAL | 0 refills | Status: DC | PRN

## 2023-09-13 NOTE — ED Provider Notes (Signed)
 [facility]    CSN: [id number] Arrival date & time: 09/13/23  1431      History   Chief Complaint Chief Complaint  Patient presents with   Cough   Nasal Congestion   Chills    HPI Greg Flowers is a 68 y.o. male.   Patient presents to clinic over concern of cough, shortness of breath and chest pain with coughing that has worsened over the past 3 days.  He has had nasal congestion and chills as well.  Has tried multiple over-the-counter medications such as Delsym  and Mucinex  DM without any improvement.  He was coughing all night last night and his wife was unable to get any sleep.  Does not have a history of asthma or COPD.  The history is provided by the patient and medical records.  Cough   Past Medical History:  Diagnosis Date   Anxiety    Calculus, kidney 2000   Sees Dr. Bosie Bye, urology.    Cervical spondylosis 2004   sp surgery by Dr. Ellery Guthrie   Depression    GERD (gastroesophageal reflux disease)    Headache(784.0)    Chronic, on vicodin 5 TID for this.    Hepatitis C    Has occasional visits with Dr. Denece Finger GI, failed RX in 2000.  Liver Biopsy 9/06: Minimally active hepatitis consistent with hepatitis C, minimal necroinflamtory activity grade 1, no incrased fibrosis stage 0.    Herpes labialis    HERPES LABIALIS 04/04/2006   Qualifier: Diagnosis of  By: Hiram Lukes MD, Annette Barters     History of kidney stones    Hypertension    Pneumonia    Renal stone    Spondylolisthesis of lumbar region    Thalassemia    HGB 9/09 14.4 wtih MCV 72.6   Viral upper respiratory illness 04/12/2021   Wears glasses     Patient Active Problem List   Diagnosis Date Noted   Lateral epicondylitis of right elbow 03/02/2022   Hyperlipidemia 12/30/2020   Alpha thalassaemia minor 05/25/2020   Healthcare maintenance 05/25/2020   Acute gout due to renal impairment involving toe 05/20/2019   Neuropathy 05/18/2019   Primary localized osteoarthritis of left hip 11/18/2018    Primary osteoarthritis of left hip 11/18/2018   Microcytic anemia 08/13/2018   Disc disease, degenerative, cervical 11/01/2017   Liver fibrosis 04/30/2017   NSAID long-term use 01/09/2016   Chronic back pain secondary to DJD of cervical, thoracic and lumbar spine and disc herniation 01/09/2016   Osteoarthritis of both knees 11/02/2011   DISORDER, DEPRESSIVE NEC 09/25/2006   Chronic hepatitis C without hepatic coma (HCC) 02/22/2006   Essential hypertension 02/22/2006    Past Surgical History:  Procedure Laterality Date   ANTERIOR CERVICAL DECOMP/DISCECTOMY FUSION N/A 11/21/2017   Procedure: ANTERIOR CERVICAL DECOMPRESSION FUSION, CERVICAL THREE-FOUR, CERVICAL FOUR-FIVE WITH INSTRUMENTATION AND ALLOGRAFT;  Surgeon: Virl Grimes, MD;  Location: [facility];  Service: Orthopedics;  Laterality: N/A;  ANTERIOR CERVICAL DECOMPRESSION FUSION, CERVICAL THREE-FOUR, CERVICAL FOUR-FIVE WITH INSTRUMENTATION AND ALLOGRAFT   BACK SURGERY  2017   L4-L5 PLATE/SCREWS   CERVICAL DISCECTOMY  2004   Dr Ellery Guthrie   COLONOSCOPY     LITHOTRIPSY  2004   Dr Luster Salters   NASAL SEPTUM SURGERY  2007   TONSILLECTOMY     TOTAL HIP ARTHROPLASTY Left 11/18/2018   Procedure: Left Anterior Hip Arthroplasty;  Surgeon: Dayne Even, MD;  Location: [facility];  Service: Orthopedics;  Laterality: Left;       Home Medications  Prior to Admission medications   Medication Sig Start Date End Date Taking? Authorizing Provider  albuterol  (VENTOLIN  HFA) 108 (90 Base) MCG/ACT inhaler Inhale 1-2 puffs into the lungs every 6 (six) hours as needed for wheezing or shortness of breath. 09/13/23  Yes Treyvone Chelf  N, FNP  allopurinol  (ZYLOPRIM ) 100 MG tablet TAKE 1/2 TABLET BY MOUTH DAILY 05/23/23  Yes Marius Siemens, NP  amLODipine  (NORVASC ) 10 MG tablet TAKE 1 TABLET BY MOUTH EVERY DAY 06/24/23  Yes Marius Siemens, NP  atenolol  (TENORMIN ) 50 MG tablet TAKE 1 TABLET BY MOUTH EVERY DAY 08/16/23  Yes Marius Siemens, NP   atorvastatin  (LIPITOR) 10 MG tablet TAKE 1 TABLET BY MOUTH EVERY DAY 06/10/23  Yes Marius Siemens, NP  DULoxetine  (CYMBALTA ) 30 MG capsule TAKE 1 CAPSULE BY MOUTH EVERY DAY 06/04/23  Yes Marius Siemens, NP  HYDROcodone  bit-homatropine (HYCODAN) 5-1.5 MG/5ML syrup Take 5 mLs by mouth every 6 (six) hours as needed for cough. 09/13/23  Yes Ethridge Sollenberger  N, FNP  losartan  (COZAAR ) 100 MG tablet Take 1 tablet (100 mg total) by mouth daily. 06/04/23  Yes Marius Siemens, NP  pantoprazole  (PROTONIX ) 40 MG tablet Take 1 tablet (40 mg total) by mouth daily. 06/04/23  Yes Marius Siemens, NP  predniSONE  (DELTASONE ) 20 MG tablet Take 2 tablets (40 mg total) by mouth daily for 5 days. 09/13/23 09/18/23 Yes Esau Fridman  N, FNP  Tamsulosin  HCl (FLOMAX ) 0.4 MG CAPS Take 0.8 mg by mouth daily.    Yes [provider]    Family History Family History  Problem Relation Age of Onset   Hypertension Mother    Bone cancer Mother    Hypertension Father    Heart disease Father    Heart attack Father     Social History Social History   Tobacco Use   Smoking status: Former    Current packs/day: 0.00    Types: Cigarettes    Quit date: 04/16/1984    Years since quitting: 39.4   Smokeless tobacco: Never   Tobacco comments:    quit in the early 1980's  Vaping Use   Vaping status: Never Used  Substance Use Topics   Alcohol use: Yes    Comment: Occasionally 2-3 glasses wine/week   Drug use: No     Allergies   Patient has no known allergies.   Review of Systems Review of Systems  Per HPI  Physical Exam Triage Vital Signs ED Triage Vitals  Encounter Vitals Group     BP 09/13/23 1452 135/89     Systolic BP Percentile --      Diastolic BP Percentile --      Pulse Rate 09/13/23 1452 84     Resp 09/13/23 1452 20     Temp 09/13/23 1452 98.3 F (36.8 C)     Temp Source 09/13/23 1452 Oral     SpO2 09/13/23 1452 97 %     Weight --      Height --      Head Circumference  --      Peak Flow --      Pain Score 09/13/23 1451 0     Pain Loc --      Pain Education --      Exclude from Growth Chart --    No data found.  Updated Vital Signs BP 135/89 (BP Location: Right Arm)   Pulse 84   Temp 98.3 F (36.8 C) (Oral)   Resp 20  SpO2 97%   Visual Acuity Right Eye Distance:   Left Eye Distance:   Bilateral Distance:    Right Eye Near:   Left Eye Near:    Bilateral Near:     Physical Exam Vitals and nursing note reviewed.  Constitutional:      Appearance: Normal appearance.  HENT:     Head: Normocephalic and atraumatic.     Right Ear: External ear normal.     Left Ear: External ear normal.     Nose: Nose normal.     Mouth/Throat:     Mouth: Mucous membranes are moist.  Eyes:     Conjunctiva/sclera: Conjunctivae normal.  Cardiovascular:     Rate and Rhythm: Normal rate and regular rhythm.     Heart sounds: Normal heart sounds. No murmur heard. Pulmonary:     Effort: Pulmonary effort is normal.     Breath sounds: Wheezing present.  Musculoskeletal:        General: Normal range of motion.  Skin:    General: Skin is warm and dry.  Neurological:     General: No focal deficit present.     Mental Status: He is alert.  Psychiatric:        Mood and Affect: Mood normal.      UC Treatments / Results  Labs (all labs ordered are listed, but only abnormal results are displayed) Labs Reviewed - No data to display  EKG   Radiology No results found.  Procedures Procedures (including critical care time)  Medications Ordered in UC Medications - No data to display  Initial Impression / Assessment and Plan / UC Course  I have reviewed the triage vital signs and the nursing notes.  Pertinent labs & imaging results that were available during my care of the patient were reviewed by me and considered in my medical decision making (see chart for details).  Vitals in triage reviewed, patient is hemodynamically stable.  Lungs with slight  expiratory wheezing, cleared with coughing.  Heart with regular rate and rhythm.  Chest x-ray by my interpretation does not show acute infiltrate, low concern for pneumonia.  Higher suspicion for bronchitis, will treat with steroid burst, albuterol  inhaler and cough syrup.   Plan of care, follow-up care return precautions given, no questions at this time.     Final Clinical Impressions(s) / UC Diagnoses   Final diagnoses:  Acute cough  Acute bronchitis, unspecified organism     Discharge Instructions      I do not see any obvious pneumonia.  I am treating you for bronchitis.  Use the inhaler every 6 hours as needed.  Start the prednisone  tomorrow with breakfast.  You can use the cough syrup every 6 hours as needed, take this prior to bed.  If the 5 mL dose is too strong you can cut back to 2.5 mL.  Ensure you are staying well-hydrated with at least 64 ounces of water daily.  Symptoms should improve over the next 5 days.  If no improvement or any changes return to clinic for reevaluation.    ED Prescriptions     Medication Sig Dispense Auth. Provider   predniSONE  (DELTASONE ) 20 MG tablet Take 2 tablets (40 mg total) by mouth daily for 5 days. 10 tablet Harlow Lighter, Rusti Arizmendi  N, FNP   albuterol  (VENTOLIN  HFA) 108 (90 Base) MCG/ACT inhaler Inhale 1-2 puffs into the lungs every 6 (six) hours as needed for wheezing or shortness of breath. 18 g Meridith Stanford, FNP  HYDROcodone  bit-homatropine (HYCODAN) 5-1.5 MG/5ML syrup Take 5 mLs by mouth every 6 (six) hours as needed for cough. 120 mL Harlow Lighter, Anglia Blakley  N, FNP      I have reviewed the PDMP during this encounter.   Harlow Lighter, Kiandra Sanguinetti  N, FNP 09/13/23 1600

## 2023-09-13 NOTE — Discharge Instructions (Addendum)
 I do not see any obvious pneumonia.  I am treating you for bronchitis.  Use the inhaler every 6 hours as needed.  Start the prednisone  tomorrow with breakfast.  You can use the cough syrup every 6 hours as needed, take this prior to bed.  If the 5 mL dose is too strong you can cut back to 2.5 mL.  Ensure you are staying well-hydrated with at least 64 ounces of water daily.  Symptoms should improve over the next 5 days.  If no improvement or any changes return to clinic for reevaluation.

## 2023-09-13 NOTE — ED Triage Notes (Signed)
 Pt states that he has cough, congestion, chills since Wednesday he has been taking a variety of OTC meds without relief.

## 2023-09-16 ENCOUNTER — Ambulatory Visit (HOSPITAL_COMMUNITY): Payer: Self-pay

## 2023-12-01 ENCOUNTER — Other Ambulatory Visit (INDEPENDENT_AMBULATORY_CARE_PROVIDER_SITE_OTHER): Payer: Self-pay | Admitting: Primary Care

## 2023-12-01 DIAGNOSIS — G8929 Other chronic pain: Secondary | ICD-10-CM

## 2023-12-01 DIAGNOSIS — Z760 Encounter for issue of repeat prescription: Secondary | ICD-10-CM

## 2024-01-27 ENCOUNTER — Other Ambulatory Visit (INDEPENDENT_AMBULATORY_CARE_PROVIDER_SITE_OTHER): Payer: Self-pay | Admitting: Primary Care

## 2024-01-27 DIAGNOSIS — M10379 Gout due to renal impairment, unspecified ankle and foot: Secondary | ICD-10-CM

## 2024-01-28 NOTE — Telephone Encounter (Signed)
 Will forward to provider

## 2024-02-19 ENCOUNTER — Encounter (HOSPITAL_COMMUNITY): Payer: Self-pay | Admitting: Emergency Medicine

## 2024-02-19 ENCOUNTER — Ambulatory Visit (HOSPITAL_COMMUNITY)
Admission: EM | Admit: 2024-02-19 | Discharge: 2024-02-19 | Disposition: A | Discharge status: Home / Self Care | Attending: Family Medicine | Admitting: Family Medicine

## 2024-02-19 DIAGNOSIS — R519 Headache, unspecified: Secondary | ICD-10-CM

## 2024-02-19 DIAGNOSIS — M25552 Pain in left hip: Secondary | ICD-10-CM

## 2024-02-19 MED ORDER — DEXAMETHASONE SOD PHOSPHATE PF 10 MG/ML IJ SOLN
10.0000 mg | Freq: Once | INTRAMUSCULAR | Status: AC
  Administered 2024-02-19: 10 mg via INTRAMUSCULAR

## 2024-02-19 MED ORDER — HYDROCODONE-ACETAMINOPHEN 5-325 MG PO TABS: 1.0000 | ORAL_TABLET | Freq: Four times a day (QID) | ORAL | 0 refills | Status: AC | PRN
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
## 2024-02-19 NOTE — Discharge Instructions (Addendum)
 Meds ordered this encounter  Medications   HYDROcodone -acetaminophen  (NORCO/VICODIN) 5-325 MG tablet    Sig: Take 1 tablet by mouth every 6 (six) hours as needed for moderate pain (pain score 4-6) or severe pain (pain score 7-10).    Dispense:  12 tablet    Refill:  0   dexamethasone  (DECADRON ) injection 10 mg      Be aware, you have been prescribed pain medications that may cause drowsiness. While taking this medication, do not take any other medications containing acetaminophen  (Tylenol ). Do not combine with alcohol or recreational drugs. Please do not drive, operate heavy machinery, or take part in activities that require making important decisions while on this medication as your judgement may be clouded.

## 2024-02-20 NOTE — ED Provider Notes (Signed)
 [facility]   [id number] 02/19/24 Arrival Time: 1618  ASSESSMENT & PLAN:  1. Acute hip pain, left   2. Mild headache    Headaches come and go. Not worsening. Normal head/neck CT last month s/p fall. No s/s of subdural. Hip pain has continued/lingered. Normal hip x-rays from OSH last month reviewed along with head/neck CT.  May need PT. Will schedule f/u with PCP to discuss.  Discharge Medication List as of 02/19/2024  6:26 PM     START taking these medications   Details  HYDROcodone -acetaminophen  (NORCO/VICODIN) 5-325 MG tablet Take 1 tablet by mouth every 6 (six) hours as needed for moderate pain (pain score 4-6) or severe pain (pain score 7-10)., Starting Wed 02/19/2024, Normal       Recommend:  Follow-up Information     Schedule an appointment as soon as possible for a visit  with Celestia Rosaline SQUIBB, NP.   Specialty: Internal Medicine Why: For follow up and to discuss physical therapy. Contact information: [address] [place] [address] [phone]                 Reviewed expectations re: course of current medical issues. Questions answered. Outlined signs and symptoms indicating need for more acute intervention. Patient verbalized understanding. After Visit Summary given.  SUBJECTIVE: History from: patient. Greg Flowers is a 68 y.o. male who reports hitting head on metal bar/fall to floor on 10/7 when getting off airplane. Denies any LOC or taking blood thinners. Was seen at ED and had Ct scans done of head, neck along with x-rays of hip. Told just had small scalp hematoma. Lingering on/off mild HA without n/v/visual changes. L hip is still bothering him; h/o replacement.  Imaging studies negative at OSH. Ambulatory here. Denies extremity sensation changes or weakness.  No tx PTA.  Past Surgical History:  Procedure Laterality Date   ANTERIOR CERVICAL DECOMP/DISCECTOMY FUSION N/A 11/21/2017   Procedure: ANTERIOR CERVICAL  DECOMPRESSION FUSION, CERVICAL THREE-FOUR, CERVICAL FOUR-FIVE WITH INSTRUMENTATION AND ALLOGRAFT;  Surgeon: Beuford Anes, MD;  Location: [facility] OR;  Service: Orthopedics;  Laterality: N/A;  ANTERIOR CERVICAL DECOMPRESSION FUSION, CERVICAL THREE-FOUR, CERVICAL FOUR-FIVE WITH INSTRUMENTATION AND ALLOGRAFT   BACK SURGERY  2017   L4-L5 PLATE/SCREWS   CERVICAL DISCECTOMY  2004   Dr Colon   COLONOSCOPY     LITHOTRIPSY  2004   Dr Janit   NASAL SEPTUM SURGERY  2007   TONSILLECTOMY     TOTAL HIP ARTHROPLASTY Left 11/18/2018   Procedure: Left Anterior Hip Arthroplasty;  Surgeon: Sheril Coy, MD;  Location: [facility];  Service: Orthopedics;  Laterality: Left;      OBJECTIVE:  Vitals:   02/19/24 1633  BP: 137/89  Pulse: 72  Resp: 18  Temp: 98.1 F (36.7 C)  TempSrc: Oral  SpO2: 98%    General appearance: alert; no distress HEENT: [place]; AT Neck: supple with FROM Resp: unlabored respirations Extremities: RLE: warm with well perfused appearance; just sore with movement; no specific TTP Skin: warm and dry; no visible rashes Neurologic: CN 2-12 grossly intact; gait normal; normal sensation and strength of bilateral LE Psychological: alert and cooperative; normal mood and affect    No Known Allergies  Past Medical History:  Diagnosis Date   Anxiety    Calculus, kidney 2000   Sees Dr. Alline, urology.    Cervical spondylosis 2004   sp surgery by Dr. Colon   Depression    GERD (gastroesophageal reflux disease)    Headache(784.0)  Chronic, on vicodin 5 TID for this.    Hepatitis C    Has occasional visits with Dr. Celestia GI, failed RX in 2000.  Liver Biopsy 9/06: Minimally active hepatitis consistent with hepatitis C, minimal necroinflamtory activity grade 1, no incrased fibrosis stage 0.    Herpes labialis    HERPES LABIALIS 04/04/2006   Qualifier: Diagnosis of  By: Sharl MD, Jackquline     History of kidney stones    Hypertension    Pneumonia    Renal stone     Spondylolisthesis of lumbar region    Thalassemia    HGB 9/09 14.4 wtih MCV 72.6   Viral upper respiratory illness 04/12/2021   Wears glasses    Social History   Socioeconomic History   Marital status: Married    Spouse name: Not on file   Number of children: Not on file   Years of education: Not on file   Highest education level: Bachelor's degree (e.g., BA, AB, BS)  Occupational History   Not on file  Tobacco Use   Smoking status: Former    Current packs/day: 0.00    Types: Cigarettes    Quit date: 04/16/1984    Years since quitting: 39.8   Smokeless tobacco: Never   Tobacco comments:    quit in the early 1980's  Vaping Use   Vaping status: Never Used  Substance and Sexual Activity   Alcohol use: Yes    Comment: Occasionally 2-3 glasses wine/week   Drug use: No   Sexual activity: Yes    Partners: Female  Other Topics Concern   Not on file  Social History Narrative   Works at post office, Married, Exercises.    Social Drivers of Corporate Investment Banker Strain: Low Risk  (06/03/2023)   Overall Financial Resource Strain (CARDIA)    Difficulty of Paying Living Expenses: Not very hard  Food Insecurity: No Food Insecurity (06/03/2023)   Hunger Vital Sign    Worried About Running Out of Food in the Last Year: Never true    Ran Out of Food in the Last Year: Never true  Transportation Needs: No Transportation Needs (06/03/2023)   PRAPARE - Administrator, Civil Service (Medical): No    Lack of Transportation (Non-Medical): No  Physical Activity: Sufficiently Active (06/03/2023)   Exercise Vital Sign    Days of Exercise per Week: 3 days    Minutes of Exercise per Session: 60 min  Stress: No Stress Concern Present (06/03/2023)   [employer] of Occupational Health - Occupational Stress Questionnaire    Feeling of Stress : Not at all  Social Connections: Socially Integrated (06/03/2023)   Social Connection and Isolation Panel    Frequency of Communication  with Friends and Family: More than three times a week    Frequency of Social Gatherings with Friends and Family: Three times a week    Attends Religious Services: More than 4 times per year    Active Member of Clubs or Organizations: Yes    Attends Engineer, Structural: More than 4 times per year    Marital Status: Married   Family History  Problem Relation Age of Onset   Hypertension Mother    Bone cancer Mother    Hypertension Father    Heart disease Father    Heart attack Father    Past Surgical History:  Procedure Laterality Date   ANTERIOR CERVICAL DECOMP/DISCECTOMY FUSION N/A 11/21/2017   Procedure: ANTERIOR CERVICAL DECOMPRESSION FUSION,  CERVICAL THREE-FOUR, CERVICAL FOUR-FIVE WITH INSTRUMENTATION AND ALLOGRAFT;  Surgeon: Beuford Anes, MD;  Location: [facility];  Service: Orthopedics;  Laterality: N/A;  ANTERIOR CERVICAL DECOMPRESSION FUSION, CERVICAL THREE-FOUR, CERVICAL FOUR-FIVE WITH INSTRUMENTATION AND ALLOGRAFT   BACK SURGERY  2017   L4-L5 PLATE/SCREWS   CERVICAL DISCECTOMY  2004   Dr Colon   COLONOSCOPY     LITHOTRIPSY  2004   Dr Janit   NASAL SEPTUM SURGERY  2007   TONSILLECTOMY     TOTAL HIP ARTHROPLASTY Left 11/18/2018   Procedure: Left Anterior Hip Arthroplasty;  Surgeon: Sheril Coy, MD;  Location: [facility];  Service: Orthopedics;  Laterality: Left;       Rolinda Rogue, MD 02/20/24 [phone]

## 2024-03-02 ENCOUNTER — Ambulatory Visit (HOSPITAL_COMMUNITY)
Admission: EM | Admit: 2024-03-02 | Discharge: 2024-03-02 | Disposition: A | Discharge status: Home / Self Care | Attending: Family Medicine | Admitting: Family Medicine

## 2024-03-02 ENCOUNTER — Ambulatory Visit (INDEPENDENT_AMBULATORY_CARE_PROVIDER_SITE_OTHER)

## 2024-03-02 ENCOUNTER — Other Ambulatory Visit: Payer: Self-pay

## 2024-03-02 ENCOUNTER — Encounter (HOSPITAL_COMMUNITY): Payer: Self-pay | Admitting: Emergency Medicine

## 2024-03-02 DIAGNOSIS — S298XXA Other specified injuries of thorax, initial encounter: Secondary | ICD-10-CM

## 2024-03-02 DIAGNOSIS — S20211A Contusion of right front wall of thorax, initial encounter: Secondary | ICD-10-CM | POA: Diagnosis not present

## 2024-03-02 DIAGNOSIS — W19XXXA Unspecified fall, initial encounter: Secondary | ICD-10-CM | POA: Diagnosis not present

## 2024-03-02 MED ORDER — TRAMADOL HCL 50 MG PO TABS: 50.0000 mg | ORAL_TABLET | Freq: Four times a day (QID) | ORAL | 0 refills | Status: AC | PRN

## 2024-03-02 MED ORDER — METHOCARBAMOL 500 MG PO TABS: 500.0000 mg | ORAL_TABLET | Freq: Two times a day (BID) | ORAL | 0 refills | Status: AC

## 2024-03-02 NOTE — Discharge Instructions (Addendum)
 I did not see any obvious displaced rib fractures.  Official radiology overread will be back over the next few hours and I will give you a call if they saw fracture that I did not.  Use the muscle relaxer up to twice daily, do not drink alcohol or drive on this medication and take additional precautions, as this can cause dizziness and can make you more prone to falls.  Use the tramadol  every 6 hours as needed for pain.  You can use this in between Tylenol .  Splint the area with a pillow or your hands when coughing or sneezing to help provide additional support.  Rib cage injuries can take weeks to heal, as they are constantly moving when you are breathing, coughing and sneezing.  Symptoms should improve over the next few weeks.  If no improvement or any changes return to clinic for reevaluation.

## 2024-03-02 NOTE — ED Provider Notes (Signed)
 [facility]    CSN: [id number] Arrival date & time: 03/02/24  1016      History   Chief Complaint Chief Complaint  Patient presents with   Fall    HPI Greg Flowers is a 68 y.o. male.   Patient presents to clinic over concern of right lateral rib cage pain after a fall 3 days ago.  Saturday he was fishing out at [employer] [employer] when his line got caught on some brush and he went to remove it when he slipped and fell.  Right leg went into the water, right rib cage hit on the concrete dock.  He was helped up and he went home to take a Epsom salt bath for an hour.  Has been taking Tylenol .  Woke up today and the pain was worse so he decided to come into clinic.  Does have pain with coughing and deep breathing.  Denies loss of consciousness or hitting his head.  Denies bruising.  Denies shortness of breath.  The history is provided by the patient and medical records.  Fall    Past Medical History:  Diagnosis Date   Anxiety    Calculus, kidney 2000   Sees Dr. Alline, urology.    Cervical spondylosis 2004   sp surgery by Dr. Colon   Depression    GERD (gastroesophageal reflux disease)    Headache(784.0)    Chronic, on vicodin 5 TID for this.    Hepatitis C    Has occasional visits with Dr. Celestia GI, failed RX in 2000.  Liver Biopsy 9/06: Minimally active hepatitis consistent with hepatitis C, minimal necroinflamtory activity grade 1, no incrased fibrosis stage 0.    Herpes labialis    HERPES LABIALIS 04/04/2006   Qualifier: Diagnosis of  By: Sharl MD, Jackquline     History of kidney stones    Hypertension    Pneumonia    Renal stone    Spondylolisthesis of lumbar region    Thalassemia    HGB 9/09 14.4 wtih MCV 72.6   Viral upper respiratory illness 04/12/2021   Wears glasses     Patient Active Problem List   Diagnosis Date Noted   Lateral epicondylitis of right elbow 03/02/2022   Hyperlipidemia 12/30/2020   Alpha thalassaemia minor 05/25/2020    Healthcare maintenance 05/25/2020   Acute gout due to renal impairment involving toe 05/20/2019   Neuropathy 05/18/2019   Primary localized osteoarthritis of left hip 11/18/2018   Primary osteoarthritis of left hip 11/18/2018   Microcytic anemia 08/13/2018   Disc disease, degenerative, cervical 11/01/2017   Liver fibrosis 04/30/2017   NSAID long-term use 01/09/2016   Chronic back pain secondary to DJD of cervical, thoracic and lumbar spine and disc herniation 01/09/2016   Osteoarthritis of both knees 11/02/2011   DISORDER, DEPRESSIVE NEC 09/25/2006   Chronic hepatitis C without hepatic coma (HCC) 02/22/2006   Essential hypertension 02/22/2006    Past Surgical History:  Procedure Laterality Date   ANTERIOR CERVICAL DECOMP/DISCECTOMY FUSION N/A 11/21/2017   Procedure: ANTERIOR CERVICAL DECOMPRESSION FUSION, CERVICAL THREE-FOUR, CERVICAL FOUR-FIVE WITH INSTRUMENTATION AND ALLOGRAFT;  Surgeon: Beuford Anes, MD;  Location: [facility];  Service: Orthopedics;  Laterality: N/A;  ANTERIOR CERVICAL DECOMPRESSION FUSION, CERVICAL THREE-FOUR, CERVICAL FOUR-FIVE WITH INSTRUMENTATION AND ALLOGRAFT   BACK SURGERY  2017   L4-L5 PLATE/SCREWS   CERVICAL DISCECTOMY  2004   Dr Colon   COLONOSCOPY     LITHOTRIPSY  2004   Dr Janit   NASAL SEPTUM SURGERY  2007   TONSILLECTOMY     TOTAL HIP ARTHROPLASTY Left 11/18/2018   Procedure: Left Anterior Hip Arthroplasty;  Surgeon: Sheril Coy, MD;  Location: [facility];  Service: Orthopedics;  Laterality: Left;       Home Medications    Prior to Admission medications   Medication Sig Start Date End Date Taking? Authorizing Provider  methocarbamol  (ROBAXIN ) 500 MG tablet Take 1 tablet (500 mg total) by mouth 2 (two) times daily. 03/02/24  Yes Carmita Boom  N, FNP  traMADol  (ULTRAM ) 50 MG tablet Take 1 tablet (50 mg total) by mouth every 6 (six) hours as needed. 03/02/24  Yes Daleyssa Loiselle  N, FNP  albuterol  (VENTOLIN  HFA) 108 (90 Base) MCG/ACT inhaler  Inhale 1-2 puffs into the lungs every 6 (six) hours as needed for wheezing or shortness of breath. 09/13/23   Dreama, Sherrey North  N, FNP  allopurinol  (ZYLOPRIM ) 100 MG tablet TAKE 1/2 TABLET BY MOUTH DAILY 01/31/24   Celestia Rosaline SQUIBB, NP  amLODipine  (NORVASC ) 10 MG tablet TAKE 1 TABLET BY MOUTH EVERY DAY 06/24/23   Celestia Rosaline SQUIBB, NP  atenolol  (TENORMIN ) 50 MG tablet TAKE 1 TABLET BY MOUTH EVERY DAY 08/16/23   Celestia Rosaline SQUIBB, NP  atorvastatin  (LIPITOR) 10 MG tablet TAKE 1 TABLET BY MOUTH EVERY DAY 06/10/23   Celestia Rosaline SQUIBB, NP  DULoxetine  (CYMBALTA ) 30 MG capsule TAKE 1 CAPSULE BY MOUTH EVERY DAY 12/01/23   Celestia Rosaline SQUIBB, NP  HYDROcodone -acetaminophen  (NORCO/VICODIN) 5-325 MG tablet Take 1 tablet by mouth every 6 (six) hours as needed for moderate pain (pain score 4-6) or severe pain (pain score 7-10). 02/19/24   Rolinda Rogue, MD  losartan  (COZAAR ) 100 MG tablet Take 1 tablet (100 mg total) by mouth daily. 06/04/23   Celestia Rosaline SQUIBB, NP  pantoprazole  (PROTONIX ) 40 MG tablet Take 1 tablet (40 mg total) by mouth daily. 06/04/23   Celestia Rosaline SQUIBB, NP  Tamsulosin  HCl (FLOMAX ) 0.4 MG CAPS Take 0.8 mg by mouth daily.     [provider]    Family History Family History  Problem Relation Age of Onset   Hypertension Mother    Bone cancer Mother    Hypertension Father    Heart disease Father    Heart attack Father     Social History Social History   Tobacco Use   Smoking status: Former    Current packs/day: 0.00    Types: Cigarettes    Quit date: 04/16/1984    Years since quitting: 39.9   Smokeless tobacco: Never   Tobacco comments:    quit in the early 1980's  Vaping Use   Vaping status: Never Used  Substance Use Topics   Alcohol use: Yes    Comment: Occasionally 2-3 glasses wine/week   Drug use: No     Allergies   Patient has no known allergies.   Review of Systems Review of Systems  Per HPI  Physical Exam Triage Vital Signs ED Triage Vitals  [03/02/24 1120]  Encounter Vitals Group     BP (!) 154/93     Girls Systolic BP Percentile      Girls Diastolic BP Percentile      Boys Systolic BP Percentile      Boys Diastolic BP Percentile      Pulse Rate 79     Resp 18     Temp 97.9 F (36.6 C)     Temp Source Oral     SpO2 97 %     Weight  Height      Head Circumference      Peak Flow      Pain Score 8     Pain Loc      Pain Education      Exclude from Growth Chart    No data found.  Updated Vital Signs BP (!) 154/93 (BP Location: Right Arm)   Pulse 79   Temp 97.9 F (36.6 C) (Oral)   Resp 18   SpO2 97%   Visual Acuity Right Eye Distance:   Left Eye Distance:   Bilateral Distance:    Right Eye Near:   Left Eye Near:    Bilateral Near:     Physical Exam Vitals and nursing note reviewed.  Constitutional:      Appearance: Normal appearance.  HENT:     Head: Normocephalic and atraumatic.     Right Ear: External ear normal.     Left Ear: External ear normal.     Nose: Nose normal.     Mouth/Throat:     Mouth: Mucous membranes are moist.  Eyes:     Conjunctiva/sclera: Conjunctivae normal.  Cardiovascular:     Rate and Rhythm: Normal rate and regular rhythm.     Heart sounds: Normal heart sounds. No murmur heard. Pulmonary:     Effort: Pulmonary effort is normal. No respiratory distress.     Breath sounds: Normal breath sounds. No wheezing.  Chest:    Musculoskeletal:        General: Tenderness and signs of injury present. No swelling. Normal range of motion.  Skin:    General: Skin is warm and dry.     Findings: No bruising.  Neurological:     General: No focal deficit present.     Mental Status: He is alert and oriented to person, place, and time.  Psychiatric:        Mood and Affect: Mood normal.        Behavior: Behavior normal. Behavior is cooperative.      UC Treatments / Results  Labs (all labs ordered are listed, but only abnormal results are displayed) Labs Reviewed - No  data to display  EKG   Radiology No results found.  Procedures Procedures (including critical care time)  Medications Ordered in UC Medications - No data to display  Initial Impression / Assessment and Plan / UC Course  I have reviewed the triage vital signs and the nursing notes.  Pertinent labs & imaging results that were available during my care of the patient were reviewed by me and considered in my medical decision making (see chart for details).  Vitals and triage reviewed, patient is hemodynamically stable.  Lungs vesicular, heart with regular rate and rhythm.  97% on room air, no acute distress.  Lateral rib cage pain on the right side along the 11th and 12th rib.  Imaging by my interpretation does not show displaced rib fracture or pneumothorax.  Will treat symptomatically with muscle relaxer.  Tramadol  as needed since Tylenol  is not controlling pain.  Plan of care, follow-up care return precautions given, no questions at this time.     Final Clinical Impressions(s) / UC Diagnoses   Final diagnoses:  Fall, initial encounter  Rib contusion, right, initial encounter     Discharge Instructions      I did not see any obvious displaced rib fractures.  Official radiology overread will be back over the next few hours and I will give you a call if they saw  fracture that I did not.  Use the muscle relaxer up to twice daily, do not drink alcohol or drive on this medication and take additional precautions, as this can cause dizziness and can make you more prone to falls.  Use the tramadol  every 6 hours as needed for pain.  You can use this in between Tylenol .  Splint the area with a pillow or your hands when coughing or sneezing to help provide additional support.  Rib cage injuries can take weeks to heal, as they are constantly moving when you are breathing, coughing and sneezing.  Symptoms should improve over the next few weeks.  If no improvement or any changes return to  clinic for reevaluation.     ED Prescriptions     Medication Sig Dispense Auth. Provider   traMADol  (ULTRAM ) 50 MG tablet Take 1 tablet (50 mg total) by mouth every 6 (six) hours as needed. 10 tablet Dreama, Maksymilian Mabey  N, FNP   methocarbamol  (ROBAXIN ) 500 MG tablet Take 1 tablet (500 mg total) by mouth 2 (two) times daily. 20 tablet Dreama, Marcoantonio Legault  N, FNP      I have reviewed the PDMP during this encounter.   Dreama, Theotis Gerdeman  N, FNP 03/02/24 1220

## 2024-03-02 NOTE — ED Triage Notes (Signed)
 Pt here for fall 2 days ago landing on right rib area on concrete wall; pt sts stepped on fishing dock and dock moved causing him to fall; pt sts increased pain with movement and cough; denies LOC or hitting head

## 2024-03-18 ENCOUNTER — Telehealth: Payer: Self-pay

## 2024-03-18 ENCOUNTER — Ambulatory Visit (INDEPENDENT_AMBULATORY_CARE_PROVIDER_SITE_OTHER): Admitting: Primary Care

## 2024-03-18 NOTE — Telephone Encounter (Signed)
 This is Greg Flowers, I was a patient there when Dr. Burt was there and she has since moved on. I wanted to see about getting another good podiatrist. I also want to know, do you clip toenails? I'm a senior citizen and I wanted to see if you do that as its difficult for me to do that. Thank you.

## 2024-03-18 NOTE — Telephone Encounter (Signed)
 Called patient and left a voicemail to call our office back to get scheduled for an appointment. I let him know that we do trimming of toenails in our office but that we only cut the toenails and smooth them down. Told patient if he wants to know look at our podiatrists and read their bio's to see who he would like to schedule with, to go to our website. Asked patient to call our main number back and choose the option for schedulers to speak with one of them when he is ready to get scheduled for an appointment.

## 2024-03-19 ENCOUNTER — Encounter (INDEPENDENT_AMBULATORY_CARE_PROVIDER_SITE_OTHER): Payer: Self-pay | Admitting: Primary Care

## 2024-03-19 ENCOUNTER — Ambulatory Visit (INDEPENDENT_AMBULATORY_CARE_PROVIDER_SITE_OTHER): Admitting: Primary Care

## 2024-03-19 VITALS — BP 151/103 | HR 82 | Resp 16 | Ht 69.0 in | Wt 197.0 lb

## 2024-03-19 DIAGNOSIS — I10 Essential (primary) hypertension: Secondary | ICD-10-CM

## 2024-03-19 DIAGNOSIS — M25551 Pain in right hip: Secondary | ICD-10-CM | POA: Diagnosis not present

## 2024-03-19 DIAGNOSIS — W19XXXD Unspecified fall, subsequent encounter: Secondary | ICD-10-CM | POA: Diagnosis not present

## 2024-03-19 DIAGNOSIS — G8929 Other chronic pain: Secondary | ICD-10-CM

## 2024-03-19 DIAGNOSIS — M545 Low back pain, unspecified: Secondary | ICD-10-CM

## 2024-03-19 DIAGNOSIS — R0789 Other chest pain: Secondary | ICD-10-CM

## 2024-03-19 DIAGNOSIS — M25552 Pain in left hip: Secondary | ICD-10-CM | POA: Diagnosis not present

## 2024-03-19 MED ORDER — KETOROLAC TROMETHAMINE 60 MG/2ML IM SOLN: 60.0000 mg | Freq: Once | INTRAMUSCULAR | Status: AC

## 2024-03-19 NOTE — Progress Notes (Signed)
 Subjective:   Greg Flowers is a 68 y.o. male presents for hospital follow up.  On 03/02/24, he was in the airport in [place] where health care assistant helped patient get into the wheelchair unclear if the wheel was off before he sat down or afterwards, however he flew out of the wheelchair and hit his head had a hematoma, complains of pain all over he has a history of multiple surgeries and return to the urgent care for continued pain both times given short amount of narcotics.  Referred to pain management unable to afford copayments and discontinued.  Past Medical History:  Diagnosis Date   Anxiety    Calculus, kidney 2000   Sees Dr. Alline, urology.    Cervical spondylosis 2004   sp surgery by Dr. Colon   Depression    GERD (gastroesophageal reflux disease)    Headache(784.0)    Chronic, on vicodin 5 TID for this.    Hepatitis C    Has occasional visits with Dr. Celestia [facility], failed RX in 2000.  Liver Biopsy 9/06: Minimally active hepatitis consistent with hepatitis C, minimal necroinflamtory activity grade 1, no incrased fibrosis stage 0.    Herpes labialis    HERPES LABIALIS 04/04/2006   Qualifier: Diagnosis of  By: Sharl MD, Jackquline     History of kidney stones    Hypertension    Pneumonia    Renal stone    Spondylolisthesis of lumbar region    Thalassemia    HGB 9/09 14.4 wtih MCV 72.6   Viral upper respiratory illness 04/12/2021   Wears glasses      No Known Allergies  Current Outpatient Medications on File Prior to Visit  Medication Sig Dispense Refill   albuterol  (VENTOLIN  HFA) 108 (90 Base) MCG/ACT inhaler Inhale 1-2 puffs into the lungs every 6 (six) hours as needed for wheezing or shortness of breath. 18 g 0   allopurinol  (ZYLOPRIM ) 100 MG tablet TAKE 1/2 TABLET BY MOUTH DAILY 45 tablet 1   amLODipine  (NORVASC ) 10 MG tablet TAKE 1 TABLET BY MOUTH EVERY DAY 90 tablet 0   atenolol  (TENORMIN ) 50 MG tablet TAKE 1 TABLET BY MOUTH EVERY DAY 90 tablet 2    atorvastatin  (LIPITOR) 10 MG tablet TAKE 1 TABLET BY MOUTH EVERY DAY 90 tablet 3   DULoxetine  (CYMBALTA ) 30 MG capsule TAKE 1 CAPSULE BY MOUTH EVERY DAY 90 capsule 1   HYDROcodone -acetaminophen  (NORCO/VICODIN) 5-325 MG tablet Take 1 tablet by mouth every 6 (six) hours as needed for moderate pain (pain score 4-6) or severe pain (pain score 7-10). 12 tablet 0   losartan  (COZAAR ) 100 MG tablet Take 1 tablet (100 mg total) by mouth daily. 90 tablet 1   methocarbamol  (ROBAXIN ) 500 MG tablet Take 1 tablet (500 mg total) by mouth 2 (two) times daily. 20 tablet 0   pantoprazole  (PROTONIX ) 40 MG tablet Take 1 tablet (40 mg total) by mouth daily. 90 tablet 1   Tamsulosin  HCl (FLOMAX ) 0.4 MG CAPS Take 0.8 mg by mouth daily.      traMADol  (ULTRAM ) 50 MG tablet Take 1 tablet (50 mg total) by mouth every 6 (six) hours as needed. 10 tablet 0   No current facility-administered medications on file prior to visit.    Review of System: ROS Comprehensive ROS Pertinent positive and negative noted in HPI   Objective:  BP (!) 151/103 (BP Location: Left Arm, Patient Position: Sitting, Cuff Size: Normal)   Pulse 82   Resp 16  Ht 5' 9 (1.753 m)   Wt 197 lb (89.4 kg)   SpO2 97%   BMI 29.09 kg/m   Filed Weights   03/19/24 1441  Weight: 197 lb (89.4 kg)    Physical Exam Vitals reviewed.  Constitutional:      Appearance: Normal appearance.     Comments: Overweight   HENT:     Head: Normocephalic.     Right Ear: Tympanic membrane, ear canal and external ear normal.     Left Ear: Tympanic membrane, ear canal and external ear normal.     Nose: Nose normal.  Eyes:     Extraocular Movements: Extraocular movements intact.     Pupils: Pupils are equal, round, and reactive to light.  Cardiovascular:     Rate and Rhythm: Normal rate and regular rhythm.  Pulmonary:     Effort: Pulmonary effort is normal.     Breath sounds: Normal breath sounds.  Abdominal:     General: Bowel sounds are normal. There is  distension.     Palpations: Abdomen is soft.  Musculoskeletal:        General: Normal range of motion.  Skin:    General: Skin is warm and dry.  Neurological:     Mental Status: He is alert and oriented to person, place, and time.  Psychiatric:        Mood and Affect: Mood normal.        Behavior: Behavior normal.        Thought Content: Thought content normal.        Judgment: Judgment normal.      Assessment:  [name] was seen today for hospitalization follow-up and hypertension.  Diagnoses and all orders for this visit:  Essential hypertension BP goal - < 14/90 Explained that having normal blood pressure is the goal and medications are helping to get to goal and maintain normal blood pressure. DIET: Limit salt intake, read nutrition labels to check salt content, limit fried and high fatty foods  Avoid using multisymptom OTC cold preparations that generally contain sudafed which can rise BP. Consult with pharmacist on best cold relief products to use for persons with HTN EXERCISE Discussed incorporating exercise such as walking - 30 minutes most days of the week and can do in 10 minute intervals     Fall, subsequent encounter -     Ambulatory referral to Physical Therapy  Rib pain on right side -     Ambulatory referral to Physical Therapy  Chronic bilateral low back pain, unspecified whether sciatica present 2/2 Bilateral hip pain Ambulatory referral to Physical Therapy Toradol  60mg  IM     This note has been created with Dragon speech recognition software and paediatric nurse. Any transcriptional errors are unintentional.   No follow-ups on file.  Rosaline SHAUNNA Bohr, NP 03/19/2024, 3:00 PM

## 2024-04-06 ENCOUNTER — Other Ambulatory Visit: Payer: Self-pay

## 2024-04-06 ENCOUNTER — Ambulatory Visit: Attending: Primary Care

## 2024-04-06 ENCOUNTER — Encounter (INDEPENDENT_AMBULATORY_CARE_PROVIDER_SITE_OTHER): Payer: Self-pay | Admitting: Primary Care

## 2024-04-06 DIAGNOSIS — M25552 Pain in left hip: Secondary | ICD-10-CM | POA: Diagnosis present

## 2024-04-06 DIAGNOSIS — R0789 Other chest pain: Secondary | ICD-10-CM | POA: Diagnosis not present

## 2024-04-06 DIAGNOSIS — M6281 Muscle weakness (generalized): Secondary | ICD-10-CM | POA: Insufficient documentation

## 2024-04-06 DIAGNOSIS — R1031 Right lower quadrant pain: Secondary | ICD-10-CM | POA: Insufficient documentation

## 2024-04-06 DIAGNOSIS — W19XXXA Unspecified fall, initial encounter: Secondary | ICD-10-CM | POA: Diagnosis not present

## 2024-04-06 NOTE — Therapy (Signed)
 " OUTPATIENT PHYSICAL THERAPY UPPER EXTREMITY EVALUATION   Patient Name: Greg Flowers MRN: [id number] DOB:April 21, 1955, 68 y.o., male Today's Date: 04/06/2024  END OF SESSION:  PT End of Session - 04/06/24 1604     Visit Number 1    Number of Visits 16    Date for Recertification  06/07/24    PT Start Time 1315    PT Stop Time 1400    PT Time Calculation (min) 45 min    Activity Tolerance Patient tolerated treatment well          Past Medical History:  Diagnosis Date   Anxiety    Calculus, kidney 2000   Sees Dr. Alline, urology.    Cervical spondylosis 2004   sp surgery by Dr. Colon   Depression    GERD (gastroesophageal reflux disease)    Headache(784.0)    Chronic, on vicodin 5 TID for this.    Hepatitis C    Has occasional visits with Dr. Celestia GI, failed RX in 2000.  Liver Biopsy 9/06: Minimally active hepatitis consistent with hepatitis C, minimal necroinflamtory activity grade 1, no incrased fibrosis stage 0.    Herpes labialis    HERPES LABIALIS 04/04/2006   Qualifier: Diagnosis of  By: Sharl MD, Jackquline     History of kidney stones    Hypertension    Pneumonia    Renal stone    Spondylolisthesis of lumbar region    Thalassemia    HGB 9/09 14.4 wtih MCV 72.6   Viral upper respiratory illness 04/12/2021   Wears glasses    Past Surgical History:  Procedure Laterality Date   ANTERIOR CERVICAL DECOMP/DISCECTOMY FUSION N/A 11/21/2017   Procedure: ANTERIOR CERVICAL DECOMPRESSION FUSION, CERVICAL THREE-FOUR, CERVICAL FOUR-FIVE WITH INSTRUMENTATION AND ALLOGRAFT;  Surgeon: Beuford Anes, MD;  Location: [facility] OR;  Service: Orthopedics;  Laterality: N/A;  ANTERIOR CERVICAL DECOMPRESSION FUSION, CERVICAL THREE-FOUR, CERVICAL FOUR-FIVE WITH INSTRUMENTATION AND ALLOGRAFT   BACK SURGERY  2017   L4-L5 PLATE/SCREWS   CERVICAL DISCECTOMY  2004   Dr Colon   COLONOSCOPY     LITHOTRIPSY  2004   Dr Janit   NASAL SEPTUM SURGERY  2007   TONSILLECTOMY     TOTAL HIP  ARTHROPLASTY Left 11/18/2018   Procedure: Left Anterior Hip Arthroplasty;  Surgeon: Sheril Coy, MD;  Location: [facility];  Service: Orthopedics;  Laterality: Left;   Patient Active Problem List   Diagnosis Date Noted   Lateral epicondylitis of right elbow 03/02/2022   Hyperlipidemia 12/30/2020   Alpha thalassaemia minor 05/25/2020   Healthcare maintenance 05/25/2020   Acute gout due to renal impairment involving toe 05/20/2019   Neuropathy 05/18/2019   Primary localized osteoarthritis of left hip 11/18/2018   Primary osteoarthritis of left hip 11/18/2018   Microcytic anemia 08/13/2018   Disc disease, degenerative, cervical 11/01/2017   Liver fibrosis 04/30/2017   NSAID long-term use 01/09/2016   Chronic back pain secondary to DJD of cervical, thoracic and lumbar spine and disc herniation 01/09/2016   Osteoarthritis of both knees 11/02/2011   DISORDER, DEPRESSIVE NEC 09/25/2006   Chronic hepatitis C without hepatic coma (HCC) 02/22/2006   Essential hypertension 02/22/2006    PCP: Celestia Rosaline SQUIBB, NP Ref Provider (PCP)   REFERRING PROVIDER: Celestia Rosaline SQUIBB, NP Ref Provider (PCP)   REFERRING DIAG:  W19.XXXD (ICD-10-CM) - Fall, subsequent encounter  R07.89 (ICD-10-CM) - Rib pain on right side    THERAPY DIAG:  Abdominal wall pain in right lower quadrant  Muscle weakness (generalized)  Pain in left hip  Rationale for Evaluation and Treatment: rehabilitation  ONSET DATE: October (wheelchair fall), November (fishing and tripped and fell on R side)  SUBJECTIVE:                                                                                                                                                                                      SUBJECTIVE STATEMENT: Clemens out of wheelchair originally in October after getting off of plane. Then fell while fishing in November.      PERTINENT HISTORY: Negative results for fracture from imaging.   PAIN:  10W Are you  having pain? Yes: NPRS scale: 10 Pain location: R rib pain, L hip, LB Pain description: dull ache Aggravating factors: movement Relieving factors: rest  PRECAUTIONS: None  RED FLAGS: None   WEIGHT BEARING RESTRICTIONS: No  FALLS:  Has patient fallen in last 6 months? Yes. Number of falls 2  OCCUPATION: N/a  PLOF: Independent  PATIENT GOALS: improve mobility, decrease pain   OBJECTIVE:  Note: Objective measures were completed at Evaluation unless otherwise noted.   PATIENT SURVEYS :  PSFS: THE PATIENT SPECIFIC FUNCTIONAL SCALE  Place score of 0-10 (0 = unable to perform activity and 10 = able to perform activity at the same level as before injury or problem)  Activity Date: 04/06/24    Improve my mobility 3    2. Overall increase my strength in my body 5    3. Increase my flexibility in my body 3    4.      Total Score 11      Total Score = Sum of activity scores/number of activities  Minimally Detectable Change: 3 points (for single activity); 2 points (for average score)  [facility] (nd). The Patient Specific Functional Scale . Retrieved from [email or web address]   COGNITION: Overall cognitive status: Within functional limits for tasks assessed     SENSATION: WFL  POSTURE: No Significant postural limitations, rounded shoulders, and forward head    BL UE wfl Lumbar rom WFL UE strength WFL   UPPER EXTREMITY ROM:   Active ROM Right eval Left eval  Shoulder flexion wfl wfl  Shoulder extension    Shoulder abduction    Shoulder adduction    Shoulder internal rotation    Shoulder external rotation    Elbow flexion    Elbow extension    Wrist flexion    Wrist extension    Wrist ulnar deviation    Wrist radial deviation    Wrist pronation    Wrist supination    (Blank rows = not tested)   LOWER EXTREMITY MMT: Hip flexion 4- BL  Hip abduction 4- BL Hip adduction 4- BL  Knee  extension 4+ BL Knee flexion 3+ BL  UPPER EXTREMITY MMT:  MMT Right eval Left eval  Shoulder flexion wfl wfl  Shoulder extension    Shoulder abduction    Shoulder adduction    Shoulder internal rotation    Shoulder external rotation    Middle trapezius    Lower trapezius    Elbow flexion    Elbow extension    Wrist flexion    Wrist extension    Wrist ulnar deviation    Wrist radial deviation    Wrist pronation    Wrist supination    Grip strength (lbs)    (Blank rows = not tested)     JOINT MOBILITY TESTING:  N/a  PALPATION:  TTP R oblique area, L hip joint                                                                                                                             TREATMENT DATE:  TREATMENT 04/06/2024:   Neuromuscular re-ed: Seated trunk rotation x6x3s Seated side bend x6x3s    Self-care/Home Management: Patient educated on HEP, POC, prognosis, and relevant tissues/anatomy.     PATIENT EDUCATION: Education details: HEP Person educated: Patient Education method: Programmer, Multimedia, Facilities Manager, and Handouts Education comprehension: verbalized understanding and returned demonstration  HOME EXERCISE PROGRAM: 5x/wk, 2x/day Seated trunk rotation x6x3s Seated side bend x6x3s  ASSESSMENT:  CLINICAL IMPRESSION: EVAL: Patient is a 68 year old male who presents with R oblique and L hip pain after fall w/ suspected acute strain from trauma. Patient presents with deficits in: excessive pain and functional strength of BL LE. As a result, the patient would benefit from skilled PT to address aforementioned deficits via plan below.    OBJECTIVE IMPAIRMENTS: decreased strength and pain.   ACTIVITY LIMITATIONS: carrying, lifting, and squatting  PERSONAL FACTORS: Age, Time since onset of injury/illness/exacerbation, and 3+ comorbidities:   are also affecting patient's functional outcome.   REHAB POTENTIAL: Fair    CLINICAL DECISION MAKING:  Evolving/moderate complexity  EVALUATION COMPLEXITY: Moderate  GOALS: Goals reviewed with patient? No  SHORT TERM GOALS: Target date: 04/27/2024    1) Patient will demonstrate 75% HEP compliance to show independence with self-management of condition   Baseline: 0% Goal status: INITIAL  2) Patient will decrease worst pain to 8 at most to improve ADL completion and overall QOL   Baseline: 10 Goal status: INITIAL    LONG TERM GOALS: Target date: 06/07/24   1) Patient will demonstrate 100% HEP compliance to show independence with self-management of condition   Baseline: 0% Goal status: INITIAL  2) Patient will decrease worst pain to 6 at most to improve ADL completion and overall QOL   Baseline: 10 Goal status: INITIAL  3) Patient will demonstrate a 6 point improvement in PSFS to show improvements in ADL completion and overall QOL    Baseline: 11 Goal status: INITIAL  4) Patient will demonstrate at least 4/5 MMT score of BL hips and LE to show improvements in muscle strength needed for ADL completion.    Baseline: see chart  Goal status: initial     PLAN: PT FREQUENCY: 1-2x/week  PT DURATION: 8 weeks  PLANNED INTERVENTIONS: 97110-Therapeutic exercises, 97530- Therapeutic activity, [id number]- Neuromuscular re-education, 97535- Self Care, [id number]- Manual therapy, [phone]- Aquatic Therapy, Patient/Family education, Balance training, and Stair training  PLAN FOR NEXT SESSION: HEP assessment and progression, symptom modulation, and loading (isolated and/or functional). Manual therapy and NME training as needed.     Washington Greener Orton Capell  PT, DPT  04/06/2024, 5:42 PM  "

## 2024-04-07 NOTE — Telephone Encounter (Signed)
 Will forward to provider

## 2024-04-22 ENCOUNTER — Ambulatory Visit: Attending: Primary Care

## 2024-04-22 DIAGNOSIS — R1031 Right lower quadrant pain: Secondary | ICD-10-CM | POA: Insufficient documentation

## 2024-04-22 DIAGNOSIS — M6281 Muscle weakness (generalized): Secondary | ICD-10-CM | POA: Insufficient documentation

## 2024-04-22 DIAGNOSIS — M5459 Other low back pain: Secondary | ICD-10-CM | POA: Diagnosis present

## 2024-04-22 DIAGNOSIS — M25552 Pain in left hip: Secondary | ICD-10-CM | POA: Diagnosis present

## 2024-04-22 NOTE — Therapy (Signed)
 " OUTPATIENT PHYSICAL THERAPY TREATMENT   Patient Name: Greg Flowers MRN: [id number] DOB:1955-09-13, 69 y.o., male Today's Date: 04/22/2024  END OF SESSION:  PT End of Session - 04/22/24 1538     Visit Number 2    Number of Visits 16    Date for Recertification  06/07/24    PT Start Time 1404    PT Stop Time 1443    PT Time Calculation (min) 39 min    Activity Tolerance Patient tolerated treatment well           Past Medical History:  Diagnosis Date   Anxiety    Calculus, kidney 2000   Sees Dr. Alline, urology.    Cervical spondylosis 2004   sp surgery by Dr. Colon   Depression    GERD (gastroesophageal reflux disease)    Headache(784.0)    Chronic, on vicodin 5 TID for this.    Hepatitis C    Has occasional visits with Dr. Celestia [facility], failed RX in 2000.  Liver Biopsy 9/06: Minimally active hepatitis consistent with hepatitis C, minimal necroinflamtory activity grade 1, no incrased fibrosis stage 0.    Herpes labialis    HERPES LABIALIS 04/04/2006   Qualifier: Diagnosis of  By: Sharl MD, Jackquline     History of kidney stones    Hypertension    Pneumonia    Renal stone    Spondylolisthesis of lumbar region    Thalassemia    HGB 9/09 14.4 wtih MCV 72.6   Viral upper respiratory illness 04/12/2021   Wears glasses    Past Surgical History:  Procedure Laterality Date   ANTERIOR CERVICAL DECOMP/DISCECTOMY FUSION N/A 11/21/2017   Procedure: ANTERIOR CERVICAL DECOMPRESSION FUSION, CERVICAL THREE-FOUR, CERVICAL FOUR-FIVE WITH INSTRUMENTATION AND ALLOGRAFT;  Surgeon: Beuford Anes, MD;  Location: [facility];  Service: Orthopedics;  Laterality: N/A;  ANTERIOR CERVICAL DECOMPRESSION FUSION, CERVICAL THREE-FOUR, CERVICAL FOUR-FIVE WITH INSTRUMENTATION AND ALLOGRAFT   BACK SURGERY  2017   L4-L5 PLATE/SCREWS   CERVICAL DISCECTOMY  2004   Dr Colon   COLONOSCOPY     LITHOTRIPSY  2004   Dr Janit   NASAL SEPTUM SURGERY  2007   TONSILLECTOMY     TOTAL HIP ARTHROPLASTY Left  11/18/2018   Procedure: Left Anterior Hip Arthroplasty;  Surgeon: Sheril Coy, MD;  Location: [facility];  Service: Orthopedics;  Laterality: Left;   Patient Active Problem List   Diagnosis Date Noted   Lateral epicondylitis of right elbow 03/02/2022   Hyperlipidemia 12/30/2020   Alpha thalassaemia minor 05/25/2020   Healthcare maintenance 05/25/2020   Acute gout due to renal impairment involving toe 05/20/2019   Neuropathy 05/18/2019   Primary localized osteoarthritis of left hip 11/18/2018   Primary osteoarthritis of left hip 11/18/2018   Microcytic anemia 08/13/2018   Disc disease, degenerative, cervical 11/01/2017   Liver fibrosis 04/30/2017   NSAID long-term use 01/09/2016   Chronic back pain secondary to DJD of cervical, thoracic and lumbar spine and disc herniation 01/09/2016   Osteoarthritis of both knees 11/02/2011   DISORDER, DEPRESSIVE NEC 09/25/2006   Chronic hepatitis C without hepatic coma (HCC) 02/22/2006   Essential hypertension 02/22/2006    PCP: Celestia Rosaline SQUIBB, NP Ref Provider (PCP)   REFERRING PROVIDER: Celestia Rosaline SQUIBB, NP Ref Provider (PCP)   REFERRING DIAG:  W19.XXXD (ICD-10-CM) - Fall, subsequent encounter  R07.89 (ICD-10-CM) - Rib pain on right side    THERAPY DIAG:  Muscle weakness (generalized)  Abdominal wall pain in right lower quadrant  Pain in left hip  Rationale for Evaluation and Treatment: rehabilitation  ONSET DATE: October (wheelchair fall), November (fishing and tripped and fell on R side)  SUBJECTIVE:                                                                                                                                                                                      SUBJECTIVE STATEMENT: Pt presents to PT with reports of 6/10 overall pain. Has been compliant with HEP.   PERTINENT HISTORY: Negative results for fracture from imaging.   PAIN:  10W Are you having pain?  Yes: NPRS scale: 10 Pain location: R  rib pain, L hip, LB Pain description: dull ache Aggravating factors: movement Relieving factors: rest  PRECAUTIONS: None  RED FLAGS: None   WEIGHT BEARING RESTRICTIONS: No  FALLS:  Has patient fallen in last 6 months? Yes. Number of falls 2  OCCUPATION: N/a  PLOF: Independent  PATIENT GOALS: improve mobility, decrease pain   OBJECTIVE:  Note: Objective measures were completed at Evaluation unless otherwise noted.   PATIENT SURVEYS :  PSFS: THE PATIENT SPECIFIC FUNCTIONAL SCALE  Place score of 0-10 (0 = unable to perform activity and 10 = able to perform activity at the same level as before injury or problem)  Activity Date: 04/06/24    Improve my mobility 3    2. Overall increase my strength in my body 5    3. Increase my flexibility in my body 3    4.      Total Score 11      Total Score = Sum of activity scores/number of activities  Minimally Detectable Change: 3 points (for single activity); 2 points (for average score)  [facility] (nd). The Patient Specific Functional Scale . Retrieved from [email or web address].pt   COGNITION: Overall cognitive status: Within functional limits for tasks assessed     SENSATION: WFL  POSTURE: No Significant postural limitations, rounded shoulders, and forward head    BL UE wfl Lumbar rom WFL UE strength WFL   UPPER EXTREMITY ROM:   Active ROM Right eval Left eval  Shoulder flexion wfl wfl  Shoulder extension    Shoulder abduction    Shoulder adduction    Shoulder internal rotation    Shoulder external rotation    Elbow flexion    Elbow extension    Wrist flexion    Wrist extension    Wrist ulnar deviation    Wrist radial deviation    Wrist pronation    Wrist supination    (Blank rows = not tested)   LOWER EXTREMITY MMT: Hip flexion 4- BL Hip abduction 4- BL Hip  adduction 4- BL  Knee extension 4+ BL Knee flexion 3+ BL  UPPER  EXTREMITY MMT:  MMT Right eval Left eval  Shoulder flexion wfl wfl  Shoulder extension    Shoulder abduction    Shoulder adduction    Shoulder internal rotation    Shoulder external rotation    Middle trapezius    Lower trapezius    Elbow flexion    Elbow extension    Wrist flexion    Wrist extension    Wrist ulnar deviation    Wrist radial deviation    Wrist pronation    Wrist supination    Grip strength (lbs)    (Blank rows = not tested)     JOINT MOBILITY TESTING:  N/a  PALPATION:  TTP R oblique area, L hip joint                                                                                                                             TREATMENT DATE:  TREATMENT 04/23/2023 Rec bike lvl 3 x 3 min LTR x 10 Bridge 2x10 Hooklying clamshell 2x15 blue band Supine SLR 2x10 each Seated horizontal abd 2x10 GTB  TREATMENT 04/06/2024 Neuromuscular re-ed: Seated trunk rotation x6x3s Seated side bend x6x3s Self-care/Home Management: Patient educated on HEP, POC, prognosis, and relevant tissues/anatomy.     PATIENT EDUCATION: Education details: HEP Person educated: Patient Education method: Programmer, Multimedia, Facilities Manager, and Handouts Education comprehension: verbalized understanding and returned demonstration  HOME EXERCISE PROGRAM: 5x/wk, 2x/day Seated trunk rotation x6x3s Seated side bend x6x3s  ASSESSMENT:  CLINICAL IMPRESSION: Pt was able to complete all prescribed exercises with no adverse effect. Today we focused on improving trunk and hip mobility as well as hip and core strength. He will see aquatic therapy next session, will work on continued progression to decrease back and trunk pain.   EVAL: Patient is a 69 year old male who presents with R oblique and L hip pain after fall w/ suspected acute strain from trauma. Patient presents with deficits in: excessive pain and functional strength of BL LE. As a result, the patient would benefit from skilled PT to  address aforementioned deficits via plan below.    OBJECTIVE IMPAIRMENTS: decreased strength and pain.   ACTIVITY LIMITATIONS: carrying, lifting, and squatting  PERSONAL FACTORS: Age, Time since onset of injury/illness/exacerbation, and 3+ comorbidities:   are also affecting patient's functional outcome.   REHAB POTENTIAL: Fair    CLINICAL DECISION MAKING: Evolving/moderate complexity  EVALUATION COMPLEXITY: Moderate  GOALS: Goals reviewed with patient? No  SHORT TERM GOALS: Target date: 04/27/2024    1) Patient will demonstrate 75% HEP compliance to show independence with self-management of condition Baseline: 0% Goal status: INITIAL  2) Patient will decrease worst pain to 8 at most to improve ADL completion and overall QOL Baseline: 10 Goal status: INITIAL    LONG TERM GOALS: Target date: 06/07/24   1) Patient will demonstrate 100% HEP compliance to show independence with  self-management of condition   Baseline: 0% Goal status: INITIAL  2) Patient will decrease worst pain to 6 at most to improve ADL completion and overall QOL   Baseline: 10 Goal status: INITIAL  3) Patient will demonstrate a 6 point improvement in PSFS to show improvements in ADL completion and overall QOL Baseline: 11 Goal status: INITIAL  4) Patient will demonstrate at least 4/5 MMT score of BL hips and LE to show improvements in muscle strength needed for ADL completion.    Baseline: see chart  Goal status: initial     PLAN: PT FREQUENCY: 1-2x/week  PT DURATION: 8 weeks  PLANNED INTERVENTIONS: 97110-Therapeutic exercises, 97530- Therapeutic activity, [id number]- Neuromuscular re-education, 97535- Self Care, [id number]- Manual therapy, [phone]- Aquatic Therapy, Patient/Family education, Balance training, and Stair training  PLAN FOR NEXT SESSION: HEP assessment and progression, symptom modulation, and loading (isolated and/or functional). Manual therapy and NME training as needed.    Alm JAYSON Kingdom PT  04/22/2024 3:38 PM   "

## 2024-04-24 ENCOUNTER — Ambulatory Visit

## 2024-04-24 DIAGNOSIS — M6281 Muscle weakness (generalized): Secondary | ICD-10-CM | POA: Diagnosis not present

## 2024-04-24 DIAGNOSIS — M25552 Pain in left hip: Secondary | ICD-10-CM

## 2024-04-24 DIAGNOSIS — R1031 Right lower quadrant pain: Secondary | ICD-10-CM

## 2024-04-24 NOTE — Therapy (Signed)
 " OUTPATIENT PHYSICAL THERAPY TREATMENT   Patient Name: Greg Flowers MRN: [id number] DOB:10/06/1955, 69 y.o., male Today's Date: 04/24/2024  END OF SESSION:  PT End of Session - 04/24/24 1234     Visit Number 3    Number of Visits 16    Date for Recertification  06/07/24    PT Start Time 1232    PT Stop Time 1312    PT Time Calculation (min) 40 min    Activity Tolerance Patient tolerated treatment well    Behavior During Therapy [facility] for tasks assessed/performed          Past Medical History:  Diagnosis Date   Anxiety    Calculus, kidney 2000   Sees Dr. Alline, urology.    Cervical spondylosis 2004   sp surgery by Dr. Colon   Depression    GERD (gastroesophageal reflux disease)    Headache(784.0)    Chronic, on vicodin 5 TID for this.    Hepatitis C    Has occasional visits with Dr. Celestia [facility], failed RX in 2000.  Liver Biopsy 9/06: Minimally active hepatitis consistent with hepatitis C, minimal necroinflamtory activity grade 1, no incrased fibrosis stage 0.    Herpes labialis    HERPES LABIALIS 04/04/2006   Qualifier: Diagnosis of  By: Sharl MD, Jackquline     History of kidney stones    Hypertension    Pneumonia    Renal stone    Spondylolisthesis of lumbar region    Thalassemia    HGB 9/09 14.4 wtih MCV 72.6   Viral upper respiratory illness 04/12/2021   Wears glasses    Past Surgical History:  Procedure Laterality Date   ANTERIOR CERVICAL DECOMP/DISCECTOMY FUSION N/A 11/21/2017   Procedure: ANTERIOR CERVICAL DECOMPRESSION FUSION, CERVICAL THREE-FOUR, CERVICAL FOUR-FIVE WITH INSTRUMENTATION AND ALLOGRAFT;  Surgeon: Beuford Anes, MD;  Location: [facility];  Service: Orthopedics;  Laterality: N/A;  ANTERIOR CERVICAL DECOMPRESSION FUSION, CERVICAL THREE-FOUR, CERVICAL FOUR-FIVE WITH INSTRUMENTATION AND ALLOGRAFT   BACK SURGERY  2017   L4-L5 PLATE/SCREWS   CERVICAL DISCECTOMY  2004   Dr Colon   COLONOSCOPY     LITHOTRIPSY  2004   Dr Janit   NASAL SEPTUM SURGERY   2007   TONSILLECTOMY     TOTAL HIP ARTHROPLASTY Left 11/18/2018   Procedure: Left Anterior Hip Arthroplasty;  Surgeon: Sheril Coy, MD;  Location: [facility];  Service: Orthopedics;  Laterality: Left;   Patient Active Problem List   Diagnosis Date Noted   Lateral epicondylitis of right elbow 03/02/2022   Hyperlipidemia 12/30/2020   Alpha thalassaemia minor 05/25/2020   Healthcare maintenance 05/25/2020   Acute gout due to renal impairment involving toe 05/20/2019   Neuropathy 05/18/2019   Primary localized osteoarthritis of left hip 11/18/2018   Primary osteoarthritis of left hip 11/18/2018   Microcytic anemia 08/13/2018   Disc disease, degenerative, cervical 11/01/2017   Liver fibrosis 04/30/2017   NSAID long-term use 01/09/2016   Chronic back pain secondary to DJD of cervical, thoracic and lumbar spine and disc herniation 01/09/2016   Osteoarthritis of both knees 11/02/2011   DISORDER, DEPRESSIVE NEC 09/25/2006   Chronic hepatitis C without hepatic coma (HCC) 02/22/2006   Essential hypertension 02/22/2006    PCP: Celestia Rosaline SQUIBB, NP Ref Provider (PCP)   REFERRING PROVIDER: Celestia Rosaline SQUIBB, NP Ref Provider (PCP)   REFERRING DIAG:  W19.XXXD (ICD-10-CM) - Fall, subsequent encounter  R07.89 (ICD-10-CM) - Rib pain on right side    THERAPY DIAG:  Muscle weakness (generalized)  Abdominal wall pain in right lower quadrant  Pain in left hip  Rationale for Evaluation and Treatment: rehabilitation  ONSET DATE: October (wheelchair fall), November (fishing and tripped and fell on R side)  SUBJECTIVE:                                                                                                                                                                                      SUBJECTIVE STATEMENT: Patient reports that he felt ok after the last land session, has not had any falls recently.   PERTINENT HISTORY: Negative results for fracture from imaging.   PAIN:   10W Are you having pain?  Yes: NPRS scale: 10 Pain location: R rib pain, L hip, LB Pain description: dull ache Aggravating factors: movement Relieving factors: rest  PRECAUTIONS: None  RED FLAGS: None   WEIGHT BEARING RESTRICTIONS: No  FALLS:  Has patient fallen in last 6 months? Yes. Number of falls 2  OCCUPATION: N/a  PLOF: Independent  PATIENT GOALS: improve mobility, decrease pain   OBJECTIVE:  Note: Objective measures were completed at Evaluation unless otherwise noted.   PATIENT SURVEYS :  PSFS: THE PATIENT SPECIFIC FUNCTIONAL SCALE  Place score of 0-10 (0 = unable to perform activity and 10 = able to perform activity at the same level as before injury or problem)  Activity Date: 04/06/24    Improve my mobility 3    2. Overall increase my strength in my body 5    3. Increase my flexibility in my body 3    4.      Total Score 11      Total Score = Sum of activity scores/number of activities  Minimally Detectable Change: 3 points (for single activity); 2 points (for average score)  [employer] [facility] (nd). The Patient Specific Functional Scale . Retrieved from [employer].pt   COGNITION: Overall cognitive status: Within functional limits for tasks assessed     SENSATION: WFL  POSTURE: No Significant postural limitations, rounded shoulders, and forward head    BL UE wfl Lumbar rom WFL UE strength WFL   UPPER EXTREMITY ROM:   Active ROM Right eval Left eval  Shoulder flexion wfl wfl  Shoulder extension    Shoulder abduction    Shoulder adduction    Shoulder internal rotation    Shoulder external rotation    Elbow flexion    Elbow extension    Wrist flexion    Wrist extension    Wrist ulnar deviation    Wrist radial deviation    Wrist pronation    Wrist supination    (Blank rows = not tested)   LOWER EXTREMITY  MMT: Hip flexion 4- BL Hip abduction 4- BL Hip  adduction 4- BL  Knee extension 4+ BL Knee flexion 3+ BL  UPPER EXTREMITY MMT:  MMT Right eval Left eval  Shoulder flexion wfl wfl  Shoulder extension    Shoulder abduction    Shoulder adduction    Shoulder internal rotation    Shoulder external rotation    Middle trapezius    Lower trapezius    Elbow flexion    Elbow extension    Wrist flexion    Wrist extension    Wrist ulnar deviation    Wrist radial deviation    Wrist pronation    Wrist supination    Grip strength (lbs)    (Blank rows = not tested)     JOINT MOBILITY TESTING:  N/a  PALPATION:  TTP R oblique area, L hip joint                                                                                                                             TREATMENT DATE:  [facility] Adult PT Treatment:                                                DATE: 04/24/24 Aquatic therapy at [facility]- [address] - therapeutic pool temp approximately 91 degrees. Pt enters building ambulating independently. Treatment took place in water 3.8 to  4 ft 8 in. deep depending upon activity.  Pt entered and exited the pool via stair and handrails ambulating. Patient entered water for aquatic therapy for first time and was introduced to principles and therapeutic effects of water as they ambulated and acclimated to pool.  Aquatic Exercise: Walking forward/backwards/side stepping Side stepping pink bells shoulder abd/add x2 laps Lunge stance pink bell shoulder flex/ext, abd/add x10 ea BIL Step ups bottom step fwd x10 BIL Standing with UE support edge of pool: Hip abd/add x20 BIL Hamstring curl x20 BIL Squats x20 Heel/toe raises x20 Hip ext/flex with knee straight x 20 BIL Hip Circles CW/CCW x10 each BIL  Pt requires the buoyancy of water for active assisted exercises with buoyancy supported for strengthening and AROM exercises. Hydrostatic pressure also supports joints by unweighting joint load by at least 50 % in 3-4 feet depth  water. 80% in chest to neck deep water. Water will provide assistance with movement using the current and laminar flow while the buoyancy reduces weight bearing. Pt requires the viscosity of the water for resistance with strengthening exercises.   TREATMENT 04/23/2023 Rec bike lvl 3 x 3 min LTR x 10 Bridge 2x10 Hooklying clamshell 2x15 blue band Supine SLR 2x10 each Seated horizontal abd 2x10 GTB  TREATMENT 04/06/2024 Neuromuscular re-ed: Seated trunk rotation x6x3s Seated side bend x6x3s Self-care/Home Management: Patient educated on HEP, POC, prognosis, and relevant tissues/anatomy.  PATIENT EDUCATION: Education details: HEP Person educated: Patient Education method: Programmer, Multimedia, Facilities Manager, and Handouts Education comprehension: verbalized understanding and returned demonstration  HOME EXERCISE PROGRAM: 5x/wk, 2x/day Seated trunk rotation x6x3s Seated side bend x6x3s  ASSESSMENT:  CLINICAL IMPRESSION: Patient presents to first aquatic PT session this POC reporting no new falls and that he felt ok after the land session this week. Session today focused on LE strengthening and improving overall standing activity tolerance in the aquatic environment for use of buoyancy to offload joints and the viscosity of water as resistance during therapeutic exercise. Patient was able to tolerate all prescribed exercises in the aquatic environment with no adverse effects. Patient continues to benefit from skilled PT services on land and aquatic based and should be progressed as able to improve functional independence.   EVAL: Patient is a 69 year old male who presents with R oblique and L hip pain after fall w/ suspected acute strain from trauma. Patient presents with deficits in: excessive pain and functional strength of BL LE. As a result, the patient would benefit from skilled PT to address aforementioned deficits via plan below.    OBJECTIVE IMPAIRMENTS: decreased strength and pain.    ACTIVITY LIMITATIONS: carrying, lifting, and squatting  PERSONAL FACTORS: Age, Time since onset of injury/illness/exacerbation, and 3+ comorbidities:   are also affecting patient's functional outcome.   REHAB POTENTIAL: Fair    CLINICAL DECISION MAKING: Evolving/moderate complexity  EVALUATION COMPLEXITY: Moderate  GOALS: Goals reviewed with patient? No  SHORT TERM GOALS: Target date: 04/27/2024    1) Patient will demonstrate 75% HEP compliance to show independence with self-management of condition Baseline: 0% Goal status: INITIAL  2) Patient will decrease worst pain to 8 at most to improve ADL completion and overall QOL Baseline: 10 Goal status: INITIAL    LONG TERM GOALS: Target date: 06/07/24   1) Patient will demonstrate 100% HEP compliance to show independence with self-management of condition   Baseline: 0% Goal status: INITIAL  2) Patient will decrease worst pain to 6 at most to improve ADL completion and overall QOL   Baseline: 10 Goal status: INITIAL  3) Patient will demonstrate a 6 point improvement in PSFS to show improvements in ADL completion and overall QOL Baseline: 11 Goal status: INITIAL  4) Patient will demonstrate at least 4/5 MMT score of BL hips and LE to show improvements in muscle strength needed for ADL completion.    Baseline: see chart  Goal status: initial     PLAN: PT FREQUENCY: 1-2x/week  PT DURATION: 8 weeks  PLANNED INTERVENTIONS: 97110-Therapeutic exercises, 97530- Therapeutic activity, [id number]- Neuromuscular re-education, 97535- Self Care, 02859- Manual therapy, [phone]- Aquatic Therapy, Patient/Family education, Balance training, and Stair training  PLAN FOR NEXT SESSION: HEP assessment and progression, symptom modulation, and loading (isolated and/or functional). Manual therapy and NME training as needed.    Corean Pouch PTA  04/24/2024 1:12 PM   "

## 2024-04-28 ENCOUNTER — Encounter (HOSPITAL_COMMUNITY): Payer: Self-pay

## 2024-04-28 ENCOUNTER — Ambulatory Visit (HOSPITAL_COMMUNITY)
Admission: RE | Admit: 2024-04-28 | Discharge: 2024-04-28 | Disposition: A | Discharge status: Home / Self Care | Source: Ambulatory Visit | Attending: Emergency Medicine | Admitting: Emergency Medicine

## 2024-04-28 VITALS — BP 134/94 | HR 83 | Temp 98.2°F | Resp 18

## 2024-04-28 DIAGNOSIS — M25552 Pain in left hip: Secondary | ICD-10-CM | POA: Diagnosis not present

## 2024-04-28 DIAGNOSIS — R519 Headache, unspecified: Secondary | ICD-10-CM

## 2024-04-28 MED ORDER — DEXAMETHASONE SOD PHOSPHATE PF 10 MG/ML IJ SOLN
INTRAMUSCULAR | Status: AC
  Filled 2024-04-28: qty 1

## 2024-04-28 MED ORDER — DEXAMETHASONE SOD PHOSPHATE PF 10 MG/ML IJ SOLN
10.0000 mg | Freq: Once | INTRAMUSCULAR | Status: AC
  Administered 2024-04-28: 10 mg via INTRAMUSCULAR

## 2024-04-28 NOTE — Medical Student Note (Signed)
 Engineer, Site Note For educational purposes for Medical, PA and NP students only and not part of the legal medical record.   CSN: [id number] Arrival date & time: 04/28/24  1705      History   Chief Complaint Chief Complaint  Patient presents with   Hip Pain    I want to see if I can get a hip shot. I think Dr. Rolinda - Entered by patient    HPI Greg Flowers is a 69 y.o. male.  Pt has a hx of depression, GERD, HTN, OA, and total hip replacement of the L. He has been doing physical therapy for the last 3 weeks twice a week and since starting he has been having increased pain in his L hip. He was seen in November of 2025 for hip pain where he received an injection of Decadron  which he states helped. He is asking for another injection to help control his pain. He also wanted to know about opioid medications for the pain and reports his PCP does not prescribe them.   The history is provided by the patient.  Hip Pain    Past Medical History:  Diagnosis Date   Anxiety    Calculus, kidney 2000   Sees Dr. Alline, urology.    Cervical spondylosis 2004   sp surgery by Dr. Colon   Depression    GERD (gastroesophageal reflux disease)    Headache(784.0)    Chronic, on vicodin 5 TID for this.    Hepatitis C    Has occasional visits with Dr. Celestia GI, failed RX in 2000.  Liver Biopsy 9/06: Minimally active hepatitis consistent with hepatitis C, minimal necroinflamtory activity grade 1, no incrased fibrosis stage 0.    Herpes labialis    HERPES LABIALIS 04/04/2006   Qualifier: Diagnosis of  By: Sharl MD, Jackquline     History of kidney stones    Hypertension    Pneumonia    Renal stone    Spondylolisthesis of lumbar region    Thalassemia    HGB 9/09 14.4 wtih MCV 72.6   Viral upper respiratory illness 04/12/2021   Wears glasses     Patient Active Problem List   Diagnosis Date Noted   Lateral epicondylitis of right elbow 03/02/2022    Hyperlipidemia 12/30/2020   Alpha thalassaemia minor 05/25/2020   Healthcare maintenance 05/25/2020   Acute gout due to renal impairment involving toe 05/20/2019   Neuropathy 05/18/2019   Primary localized osteoarthritis of left hip 11/18/2018   Primary osteoarthritis of left hip 11/18/2018   Microcytic anemia 08/13/2018   Disc disease, degenerative, cervical 11/01/2017   Liver fibrosis 04/30/2017   NSAID long-term use 01/09/2016   Chronic back pain secondary to DJD of cervical, thoracic and lumbar spine and disc herniation 01/09/2016   Osteoarthritis of both knees 11/02/2011   DISORDER, DEPRESSIVE NEC 09/25/2006   Chronic hepatitis C without hepatic coma (HCC) 02/22/2006   Essential hypertension 02/22/2006    Past Surgical History:  Procedure Laterality Date   ANTERIOR CERVICAL DECOMP/DISCECTOMY FUSION N/A 11/21/2017   Procedure: ANTERIOR CERVICAL DECOMPRESSION FUSION, CERVICAL THREE-FOUR, CERVICAL FOUR-FIVE WITH INSTRUMENTATION AND ALLOGRAFT;  Surgeon: Beuford Anes, MD;  Location: [facility];  Service: Orthopedics;  Laterality: N/A;  ANTERIOR CERVICAL DECOMPRESSION FUSION, CERVICAL THREE-FOUR, CERVICAL FOUR-FIVE WITH INSTRUMENTATION AND ALLOGRAFT   BACK SURGERY  2017   L4-L5 PLATE/SCREWS   CERVICAL DISCECTOMY  2004   Dr Colon   COLONOSCOPY     LITHOTRIPSY  2004   Dr Janit   NASAL SEPTUM SURGERY  2007   TONSILLECTOMY     TOTAL HIP ARTHROPLASTY Left 11/18/2018   Procedure: Left Anterior Hip Arthroplasty;  Surgeon: Sheril Coy, MD;  Location: [facility];  Service: Orthopedics;  Laterality: Left;       Home Medications    Prior to Admission medications  Medication Sig Start Date End Date Taking? Authorizing Provider  albuterol  (VENTOLIN  HFA) 108 (90 Base) MCG/ACT inhaler Inhale 1-2 puffs into the lungs every 6 (six) hours as needed for wheezing or shortness of breath. 09/13/23   Ball, [place]  G, FNP  allopurinol  (ZYLOPRIM ) 100 MG tablet TAKE 1/2 TABLET BY MOUTH DAILY 01/31/24    Celestia Rosaline SQUIBB, NP  amLODipine  (NORVASC ) 10 MG tablet TAKE 1 TABLET BY MOUTH EVERY DAY 06/24/23   Celestia Rosaline SQUIBB, NP  atenolol  (TENORMIN ) 50 MG tablet TAKE 1 TABLET BY MOUTH EVERY DAY 08/16/23   Celestia Rosaline SQUIBB, NP  atorvastatin  (LIPITOR) 10 MG tablet TAKE 1 TABLET BY MOUTH EVERY DAY 06/10/23   Celestia Rosaline SQUIBB, NP  DULoxetine  (CYMBALTA ) 30 MG capsule TAKE 1 CAPSULE BY MOUTH EVERY DAY 12/01/23   Celestia Rosaline SQUIBB, NP  HYDROcodone -acetaminophen  (NORCO/VICODIN) 5-325 MG tablet Take 1 tablet by mouth every 6 (six) hours as needed for moderate pain (pain score 4-6) or severe pain (pain score 7-10). 02/19/24   Rolinda Rogue, MD  losartan  (COZAAR ) 100 MG tablet Take 1 tablet (100 mg total) by mouth daily. 06/04/23   Celestia Rosaline SQUIBB, NP  methocarbamol  (ROBAXIN ) 500 MG tablet Take 1 tablet (500 mg total) by mouth 2 (two) times daily. 03/02/24   Ball, [place]  G, FNP  pantoprazole  (PROTONIX ) 40 MG tablet Take 1 tablet (40 mg total) by mouth daily. 06/04/23   Celestia Rosaline SQUIBB, NP  Tamsulosin  HCl (FLOMAX ) 0.4 MG CAPS Take 0.8 mg by mouth daily.     [provider]  traMADol  (ULTRAM ) 50 MG tablet Take 1 tablet (50 mg total) by mouth every 6 (six) hours as needed. 03/02/24   Mercer, [place]  G, FNP    Family History Family History  Problem Relation Age of Onset   Hypertension Mother    Bone cancer Mother    Hypertension Father    Heart disease Father    Heart attack Father     Social History Social History[1]   Allergies   Patient has no known allergies.   Review of Systems Review of Systems  All other systems reviewed and are negative.    Physical Exam Updated Vital Signs BP (!) 134/94 (BP Location: Left Arm)   Pulse 83   Temp 98.2 F (36.8 C) (Oral)   Resp 18   SpO2 97%   Physical Exam Constitutional:      Appearance: Normal appearance.  Cardiovascular:     Rate and Rhythm: Normal rate and regular rhythm.     Pulses: Normal pulses.          Dorsalis  pedis pulses are 2+ on the right side and 2+ on the left side.     Heart sounds: Normal heart sounds.  Pulmonary:     Effort: Pulmonary effort is normal.     Breath sounds: Normal breath sounds.  Musculoskeletal:        General: Tenderness (tendeness over the L greater trochanter and to the L inner thigh.) present. No swelling.  Neurological:     Mental Status: He is alert.      ED Treatments / Results  Labs (all labs ordered are listed, but only abnormal results are displayed) Labs Reviewed - No data to display  EKG  Radiology No results found.  Procedures Procedures (including critical care time)  Medications Ordered in ED Medications - No data to display   Initial Impression / Assessment and Plan / ED Course  I have reviewed the triage vital signs and the nursing notes.  Pertinent labs & imaging results that were available during my care of the patient were reviewed by me and considered in my medical decision making (see chart for details).    L chronic hip pain  Discussed pain control options with the patient. Advised patient that we would not be able to prescribe opioids at this juncture. Counseled patient on returning to orthopedist for evaluation of intraarticular injections and if those are not sufficient then for him to request a referral to a pain clinic from his PCP. Will admin Decadron  10 mg IM today in clinic.   Red flag symptoms reviewed and return precautions given.    Final Clinical Impressions(s) / ED Diagnoses   Final diagnoses:  None    New Prescriptions New Prescriptions   No medications on file       [1]  Social History Tobacco Use   Smoking status: Former    Current packs/day: 0.00    Types: Cigarettes    Quit date: 04/16/1984    Years since quitting: 40.0   Smokeless tobacco: Never   Tobacco comments:    quit in the early 1980's  Vaping Use   Vaping status: Never Used  Substance Use Topics   Alcohol use: Yes    Comment:  Occasionally 2-3 glasses wine/week   Drug use: No   "

## 2024-04-28 NOTE — ED Triage Notes (Signed)
 Patient has had a headache for the last two days.  Patient has been having left hip pain for about three weeks since he start therapy.  Patient has been taking extra strength tylenol .  Patient would like a shot like DR. Hagler gave him last time because it worked really well.  Patient went to PCP and they do not prescribe narcotics.

## 2024-04-28 NOTE — Discharge Instructions (Signed)
 We have given you a steroid injection today in clinic and this should help with pain in the next 30 min - 1 hour   Continue with physical therapy and follow-up with orthopedic to discuss potential joint injections versus other pain management options  Follow-up with your primary care provider if the headache persist.  For any severe headache, worst headache of your life, or new concerning symptoms seek immediate care

## 2024-04-28 NOTE — ED Provider Notes (Addendum)
 " [facility]    CSN: [id number] Arrival date & time: 04/28/24  1705      History   Chief Complaint Chief Complaint  Patient presents with   Hip Pain    I want to see if I can get a hip shot. I think Dr. Rolinda - Entered by patient    HPI Greg Flowers is a 69 y.o. male.   Patient presents to clinic over concern of headache x2 days and left hip pain x3 weeks   Has been doing PT for hip pain twice a week  This has been increasing his pain Had a decadron  IM injection a few months ago and this seemed to help a lot  Hx of previous hip replacement  Has not had trauma or falls  This is ongoing issue   Has had a right sided headache for the past three days as well, taking Tylenol  without much improvement  Denies vision changes or N/V   The history is provided by the patient and medical records.  Hip Pain    Past Medical History:  Diagnosis Date   Anxiety    Calculus, kidney 2000   Sees Dr. Alline, urology.    Cervical spondylosis 2004   sp surgery by Dr. Colon   Depression    GERD (gastroesophageal reflux disease)    Headache(784.0)    Chronic, on vicodin 5 TID for this.    Hepatitis C    Has occasional visits with Dr. Celestia GI, failed RX in 2000.  Liver Biopsy 9/06: Minimally active hepatitis consistent with hepatitis C, minimal necroinflamtory activity grade 1, no incrased fibrosis stage 0.    Herpes labialis    HERPES LABIALIS 04/04/2006   Qualifier: Diagnosis of  By: Sharl MD, Jackquline     History of kidney stones    Hypertension    Pneumonia    Renal stone    Spondylolisthesis of lumbar region    Thalassemia    HGB 9/09 14.4 wtih MCV 72.6   Viral upper respiratory illness 04/12/2021   Wears glasses     Patient Active Problem List   Diagnosis Date Noted   Lateral epicondylitis of right elbow 03/02/2022   Hyperlipidemia 12/30/2020   Alpha thalassaemia minor 05/25/2020   Healthcare maintenance 05/25/2020   Acute gout due to renal  impairment involving toe 05/20/2019   Neuropathy 05/18/2019   Primary localized osteoarthritis of left hip 11/18/2018   Primary osteoarthritis of left hip 11/18/2018   Microcytic anemia 08/13/2018   Disc disease, degenerative, cervical 11/01/2017   Liver fibrosis 04/30/2017   NSAID long-term use 01/09/2016   Chronic back pain secondary to DJD of cervical, thoracic and lumbar spine and disc herniation 01/09/2016   Osteoarthritis of both knees 11/02/2011   DISORDER, DEPRESSIVE NEC 09/25/2006   Chronic hepatitis C without hepatic coma (HCC) 02/22/2006   Essential hypertension 02/22/2006    Past Surgical History:  Procedure Laterality Date   ANTERIOR CERVICAL DECOMP/DISCECTOMY FUSION N/A 11/21/2017   Procedure: ANTERIOR CERVICAL DECOMPRESSION FUSION, CERVICAL THREE-FOUR, CERVICAL FOUR-FIVE WITH INSTRUMENTATION AND ALLOGRAFT;  Surgeon: Beuford Anes, MD;  Location: [facility];  Service: Orthopedics;  Laterality: N/A;  ANTERIOR CERVICAL DECOMPRESSION FUSION, CERVICAL THREE-FOUR, CERVICAL FOUR-FIVE WITH INSTRUMENTATION AND ALLOGRAFT   BACK SURGERY  2017   L4-L5 PLATE/SCREWS   CERVICAL DISCECTOMY  2004   Dr Colon   COLONOSCOPY     LITHOTRIPSY  2004   Dr Janit   NASAL SEPTUM SURGERY  2007   TONSILLECTOMY  TOTAL HIP ARTHROPLASTY Left 11/18/2018   Procedure: Left Anterior Hip Arthroplasty;  Surgeon: Sheril Coy, MD;  Location: [facility];  Service: Orthopedics;  Laterality: Left;       Home Medications    Prior to Admission medications  Medication Sig Start Date End Date Taking? Authorizing Provider  albuterol  (VENTOLIN  HFA) 108 (90 Base) MCG/ACT inhaler Inhale 1-2 puffs into the lungs every 6 (six) hours as needed for wheezing or shortness of breath. 09/13/23   Ball, Arnold Kester  G, FNP  allopurinol  (ZYLOPRIM ) 100 MG tablet TAKE 1/2 TABLET BY MOUTH DAILY 01/31/24   Celestia Rosaline SQUIBB, NP  amLODipine  (NORVASC ) 10 MG tablet TAKE 1 TABLET BY MOUTH EVERY DAY 06/24/23   Celestia Rosaline SQUIBB, NP   atenolol  (TENORMIN ) 50 MG tablet TAKE 1 TABLET BY MOUTH EVERY DAY 08/16/23   Celestia Rosaline SQUIBB, NP  atorvastatin  (LIPITOR) 10 MG tablet TAKE 1 TABLET BY MOUTH EVERY DAY 06/10/23   Celestia Rosaline SQUIBB, NP  DULoxetine  (CYMBALTA ) 30 MG capsule TAKE 1 CAPSULE BY MOUTH EVERY DAY 12/01/23   Celestia Rosaline SQUIBB, NP  HYDROcodone -acetaminophen  (NORCO/VICODIN) 5-325 MG tablet Take 1 tablet by mouth every 6 (six) hours as needed for moderate pain (pain score 4-6) or severe pain (pain score 7-10). 02/19/24   Rolinda Rogue, MD  losartan  (COZAAR ) 100 MG tablet Take 1 tablet (100 mg total) by mouth daily. 06/04/23   Celestia Rosaline SQUIBB, NP  methocarbamol  (ROBAXIN ) 500 MG tablet Take 1 tablet (500 mg total) by mouth 2 (two) times daily. 03/02/24   Ball, Alara Landowski  G, FNP  pantoprazole  (PROTONIX ) 40 MG tablet Take 1 tablet (40 mg total) by mouth daily. 06/04/23   Celestia Rosaline SQUIBB, NP  Tamsulosin  HCl (FLOMAX ) 0.4 MG CAPS Take 0.8 mg by mouth daily.     [provider]  traMADol  (ULTRAM ) 50 MG tablet Take 1 tablet (50 mg total) by mouth every 6 (six) hours as needed. 03/02/24   Mercer, Ankith Edmonston  G, FNP    Family History Family History  Problem Relation Age of Onset   Hypertension Mother    Bone cancer Mother    Hypertension Father    Heart disease Father    Heart attack Father     Social History Social History[1]   Allergies   Patient has no known allergies.   Review of Systems Review of Systems  Per HPI  Physical Exam Triage Vital Signs ED Triage Vitals  Encounter Vitals Group     BP 04/28/24 1723 (!) 134/94     Girls Systolic BP Percentile --      Girls Diastolic BP Percentile --      Boys Systolic BP Percentile --      Boys Diastolic BP Percentile --      Pulse Rate 04/28/24 1723 83     Resp 04/28/24 1723 18     Temp 04/28/24 1723 98.2 F (36.8 C)     Temp Source 04/28/24 1723 Oral     SpO2 04/28/24 1723 97 %     Weight --      Height --      Head Circumference --      Peak Flow  --      Pain Score 04/28/24 1721 10     Pain Loc --      Pain Education --      Exclude from Growth Chart --    No data found.  Updated Vital Signs BP (!) 134/94 (BP Location: Left Arm)   Pulse 83  Temp 98.2 F (36.8 C) (Oral)   Resp 18   SpO2 97%   Visual Acuity Right Eye Distance:   Left Eye Distance:   Bilateral Distance:    Right Eye Near:   Left Eye Near:    Bilateral Near:     Physical Exam Vitals and nursing note reviewed.  Constitutional:      Appearance: Normal appearance.  HENT:     Head: Normocephalic and atraumatic.     Right Ear: External ear normal.     Left Ear: External ear normal.     Nose: Nose normal.     Mouth/Throat:     Mouth: Mucous membranes are moist.  Eyes:     General: No scleral icterus.       Right eye: No discharge.        Left eye: No discharge.     Conjunctiva/sclera: Conjunctivae normal.  Cardiovascular:     Rate and Rhythm: Normal rate.  Pulmonary:     Effort: Pulmonary effort is normal. No respiratory distress.  Musculoskeletal:        General: Tenderness present. No signs of injury.  Skin:    General: Skin is warm and dry.  Neurological:     General: No focal deficit present.     Mental Status: He is alert and oriented to person, place, and time.  Psychiatric:        Mood and Affect: Mood normal.        Behavior: Behavior normal.      UC Treatments / Results  Labs (all labs ordered are listed, but only abnormal results are displayed) Labs Reviewed - No data to display  EKG   Radiology No results found.  Procedures Procedures (including critical care time)  Medications Ordered in UC Medications  dexamethasone  (DECADRON ) injection 10 mg (has no administration in time range)    Initial Impression / Assessment and Plan / UC Course  I have reviewed the triage vital signs and the nursing notes.  Pertinent labs & imaging results that were available during my care of the patient were reviewed by me and  considered in my medical decision making (see chart for details).  Vitals and triage reviewed, patient is hemodynamically stable.  Left hip with some diffuse tenderness.  Atraumatic and without injury.  Seems to have a flareup of the chronic hip pain after physical therapy.  Will trial IM Decadron  again and encourage orthopedic follow-up.  Headache may improve with Decadron , this has helped in the past.  Without red flag symptoms.  Plan of care, follow-up care return precautions given, no questions at this time.    Final Clinical Impressions(s) / UC Diagnoses   Final diagnoses:  Left hip pain  Bad headache     Discharge Instructions      We have given you a steroid injection today in clinic and this should help with pain in the next 30 min - 1 hour   Continue with physical therapy and follow-up with orthopedic to discuss potential joint injections versus other pain management options  Follow-up with your primary care provider if the headache persist.  For any severe headache, worst headache of your life, or new concerning symptoms seek immediate care      ED Prescriptions   None    PDMP not reviewed this encounter.       [1]  Social History Tobacco Use   Smoking status: Former    Current packs/day: 0.00    Types: Cigarettes  Quit date: 04/16/1984    Years since quitting: 40.0   Smokeless tobacco: Never   Tobacco comments:    quit in the early 1980's  Vaping Use   Vaping status: Never Used  Substance Use Topics   Alcohol use: Yes    Comment: Occasionally 2-3 glasses wine/week   Drug use: No     Mercer Rayford MATSU, FNP 04/28/24 1827  "

## 2024-04-29 ENCOUNTER — Ambulatory Visit

## 2024-04-29 DIAGNOSIS — M6281 Muscle weakness (generalized): Secondary | ICD-10-CM

## 2024-04-29 DIAGNOSIS — M25552 Pain in left hip: Secondary | ICD-10-CM

## 2024-04-29 DIAGNOSIS — R1031 Right lower quadrant pain: Secondary | ICD-10-CM

## 2024-04-29 NOTE — Therapy (Signed)
 " OUTPATIENT PHYSICAL THERAPY TREATMENT   Patient Name: Greg Flowers MRN: [id number] DOB:05-Oct-1955, 69 y.o., male Today's Date: 04/29/2024  END OF SESSION:  PT End of Session - 04/29/24 1401     Visit Number 4    Number of Visits 16    Date for Recertification  06/07/24    PT Start Time 1401    PT Stop Time 1441    PT Time Calculation (min) 40 min    Activity Tolerance Patient tolerated treatment well    Behavior During Therapy [facility] for tasks assessed/performed           Past Medical History:  Diagnosis Date   Anxiety    Calculus, kidney 2000   Sees Dr. Alline, urology.    Cervical spondylosis 2004   sp surgery by Dr. Colon   Depression    GERD (gastroesophageal reflux disease)    Headache(784.0)    Chronic, on vicodin 5 TID for this.    Hepatitis C    Has occasional visits with Dr. Celestia [facility], failed RX in 2000.  Liver Biopsy 9/06: Minimally active hepatitis consistent with hepatitis C, minimal necroinflamtory activity grade 1, no incrased fibrosis stage 0.    Herpes labialis    HERPES LABIALIS 04/04/2006   Qualifier: Diagnosis of  By: Sharl MD, Jackquline     History of kidney stones    Hypertension    Pneumonia    Renal stone    Spondylolisthesis of lumbar region    Thalassemia    HGB 9/09 14.4 wtih MCV 72.6   Viral upper respiratory illness 04/12/2021   Wears glasses    Past Surgical History:  Procedure Laterality Date   ANTERIOR CERVICAL DECOMP/DISCECTOMY FUSION N/A 11/21/2017   Procedure: ANTERIOR CERVICAL DECOMPRESSION FUSION, CERVICAL THREE-FOUR, CERVICAL FOUR-FIVE WITH INSTRUMENTATION AND ALLOGRAFT;  Surgeon: Beuford Anes, MD;  Location: [facility] OR;  Service: Orthopedics;  Laterality: N/A;  ANTERIOR CERVICAL DECOMPRESSION FUSION, CERVICAL THREE-FOUR, CERVICAL FOUR-FIVE WITH INSTRUMENTATION AND ALLOGRAFT   BACK SURGERY  2017   L4-L5 PLATE/SCREWS   CERVICAL DISCECTOMY  2004   Dr Colon   COLONOSCOPY     LITHOTRIPSY  2004   Dr Janit   NASAL SEPTUM SURGERY   2007   TONSILLECTOMY     TOTAL HIP ARTHROPLASTY Left 11/18/2018   Procedure: Left Anterior Hip Arthroplasty;  Surgeon: Sheril Coy, MD;  Location: [facility];  Service: Orthopedics;  Laterality: Left;   Patient Active Problem List   Diagnosis Date Noted   Lateral epicondylitis of right elbow 03/02/2022   Hyperlipidemia 12/30/2020   Alpha thalassaemia minor 05/25/2020   Healthcare maintenance 05/25/2020   Acute gout due to renal impairment involving toe 05/20/2019   Neuropathy 05/18/2019   Primary localized osteoarthritis of left hip 11/18/2018   Primary osteoarthritis of left hip 11/18/2018   Microcytic anemia 08/13/2018   Disc disease, degenerative, cervical 11/01/2017   Liver fibrosis 04/30/2017   NSAID long-term use 01/09/2016   Chronic back pain secondary to DJD of cervical, thoracic and lumbar spine and disc herniation 01/09/2016   Osteoarthritis of both knees 11/02/2011   DISORDER, DEPRESSIVE NEC 09/25/2006   Chronic hepatitis C without hepatic coma (HCC) 02/22/2006   Essential hypertension 02/22/2006    PCP: Celestia Rosaline SQUIBB, NP Ref Provider (PCP)   REFERRING PROVIDER: Celestia Rosaline SQUIBB, NP Ref Provider (PCP)   REFERRING DIAG:  W19.XXXD (ICD-10-CM) - Fall, subsequent encounter R07.89 (ICD-10-CM) - Rib pain on right side   THERAPY DIAG:  Muscle weakness (generalized)  Abdominal wall pain in right lower quadrant  Pain in left hip  Rationale for Evaluation and Treatment: rehabilitation  ONSET DATE: October (wheelchair fall), November (fishing and tripped and fell on R side)  SUBJECTIVE:                                                                                                                                                                                      SUBJECTIVE STATEMENT: Pt presents to PT with reports of 2/10 pain in L hip, had an injection yesterday. Has been compliant with HEP.   PERTINENT HISTORY: Negative results for fracture from  imaging.   PAIN:  10W Are you having pain?  Yes: NPRS scale: 10 Pain location: R rib pain, L hip, LB Pain description: dull ache Aggravating factors: movement Relieving factors: rest  PRECAUTIONS: None  RED FLAGS: None   WEIGHT BEARING RESTRICTIONS: No  FALLS:  Has patient fallen in last 6 months? Yes. Number of falls 2  OCCUPATION: N/a  PLOF: Independent  PATIENT GOALS: improve mobility, decrease pain   OBJECTIVE:  Note: Objective measures were completed at Evaluation unless otherwise noted.   PATIENT SURVEYS :  PSFS: THE PATIENT SPECIFIC FUNCTIONAL SCALE  Place score of 0-10 (0 = unable to perform activity and 10 = able to perform activity at the same level as before injury or problem)  Activity Date: 04/06/24    Improve my mobility 3    2. Overall increase my strength in my body 5    3. Increase my flexibility in my body 3    4.      Total Score 11      Total Score = Sum of activity scores/number of activities  Minimally Detectable Change: 3 points (for single activity); 2 points (for average score)  [employer] [facility] (nd). The Patient Specific Functional Scale . Retrieved from [employer].pt   COGNITION: Overall cognitive status: Within functional limits for tasks assessed     SENSATION: WFL  POSTURE: No Significant postural limitations, rounded shoulders, and forward head    BL UE wfl Lumbar rom WFL UE strength WFL   UPPER EXTREMITY ROM:   Active ROM Right eval Left eval  Shoulder flexion wfl wfl  Shoulder extension    Shoulder abduction    Shoulder adduction    Shoulder internal rotation    Shoulder external rotation    Elbow flexion    Elbow extension    Wrist flexion    Wrist extension    Wrist ulnar deviation    Wrist radial deviation    Wrist pronation    Wrist supination    (Blank rows = not tested)  LOWER EXTREMITY MMT: Hip flexion 4- BL Hip  abduction 4- BL Hip adduction 4- BL  Knee extension 4+ BL Knee flexion 3+ BL  UPPER EXTREMITY MMT:  MMT Right eval Left eval  Shoulder flexion wfl wfl  Shoulder extension    Shoulder abduction    Shoulder adduction    Shoulder internal rotation    Shoulder external rotation    Middle trapezius    Lower trapezius    Elbow flexion    Elbow extension    Wrist flexion    Wrist extension    Wrist ulnar deviation    Wrist radial deviation    Wrist pronation    Wrist supination    Grip strength (lbs)    (Blank rows = not tested)     JOINT MOBILITY TESTING:  N/a  PALPATION:  TTP R oblique area, L hip joint                                                                                        TREATMENT DATE:  TREATMENT 04/29/2024 NuStep lvl 5 UE/LE x 4 min while taking subjective LTR x 10 - 5 hold Bridge 2x10 Supine SLR 2x10 each STS 2x10 10# KB FM row 3x8 17# Pallof press 2x10 7# Functional squat 2x10 B UE  [facility] Adult PT Treatment:                                                DATE: 04/24/24 Aquatic therapy at [facility]- [address] - therapeutic pool temp approximately 91 degrees. Pt enters building ambulating independently. Treatment took place in water 3.8 to  4 ft 8 in. deep depending upon activity.  Pt entered and exited the pool via stair and handrails ambulating. Patient entered water for aquatic therapy for first time and was introduced to principles and therapeutic effects of water as they ambulated and acclimated to pool.  Aquatic Exercise: Walking forward/backwards/side stepping Side stepping pink bells shoulder abd/add x2 laps Lunge stance pink bell shoulder flex/ext, abd/add x10 ea BIL Step ups bottom step fwd x10 BIL Standing with UE support edge of pool: Hip abd/add x20 BIL Hamstring curl x20 BIL Squats x20 Heel/toe raises x20 Hip ext/flex with knee straight x 20 BIL Hip Circles CW/CCW x10 each BIL  Pt requires the buoyancy of water  for active assisted exercises with buoyancy supported for strengthening and AROM exercises. Hydrostatic pressure also supports joints by unweighting joint load by at least 50 % in 3-4 feet depth water. 80% in chest to neck deep water. Water will provide assistance with movement using the current and laminar flow while the buoyancy reduces weight bearing. Pt requires the viscosity of the water for resistance with strengthening exercises.   TREATMENT 04/22/2024 Rec bike lvl 3 x 3 min LTR x 10 Bridge 2x10 Hooklying clamshell 2x15 blue band Supine SLR 2x10 each Seated horizontal abd 2x10 GTB  TREATMENT 04/06/2024 Neuromuscular re-ed: Seated trunk rotation x6x3s Seated side bend x6x3s Self-care/Home Management: Patient educated on HEP, POC, prognosis,  and relevant tissues/anatomy.     PATIENT EDUCATION: Education details: HEP Person educated: Patient Education method: Programmer, Multimedia, Facilities Manager, and Handouts Education comprehension: verbalized understanding and returned demonstration  HOME EXERCISE PROGRAM: 5x/wk, 2x/day Seated trunk rotation x6x3s Seated side bend x6x3s  ASSESSMENT:  CLINICAL IMPRESSION: Pt was able to complete all prescribed exercises with no adverse effect. Today we worked on core/hip strength and functional mobility in order to decrease pain and improve comfort. Pt is progressing well with therapy, will continue to progress as able per POC.   EVAL: Patient is a 69 year old male who presents with R oblique and L hip pain after fall w/ suspected acute strain from trauma. Patient presents with deficits in: excessive pain and functional strength of BL LE. As a result, the patient would benefit from skilled PT to address aforementioned deficits via plan below.    OBJECTIVE IMPAIRMENTS: decreased strength and pain.   ACTIVITY LIMITATIONS: carrying, lifting, and squatting  PERSONAL FACTORS: Age, Time since onset of injury/illness/exacerbation, and 3+ comorbidities:    are also affecting patient's functional outcome.   REHAB POTENTIAL: Fair    CLINICAL DECISION MAKING: Evolving/moderate complexity  EVALUATION COMPLEXITY: Moderate  GOALS: Goals reviewed with patient? No  SHORT TERM GOALS: Target date: 04/27/2024    1) Patient will demonstrate 75% HEP compliance to show independence with self-management of condition Baseline: 0% Goal status: INITIAL  2) Patient will decrease worst pain to 8 at most to improve ADL completion and overall QOL Baseline: 10 Goal status: INITIAL    LONG TERM GOALS: Target date: 06/07/24   1) Patient will demonstrate 100% HEP compliance to show independence with self-management of condition   Baseline: 0% Goal status: INITIAL  2) Patient will decrease worst pain to 6 at most to improve ADL completion and overall QOL   Baseline: 10 Goal status: INITIAL  3) Patient will demonstrate a 6 point improvement in PSFS to show improvements in ADL completion and overall QOL Baseline: 11 Goal status: INITIAL  4) Patient will demonstrate at least 4/5 MMT score of BL hips and LE to show improvements in muscle strength needed for ADL completion.    Baseline: see chart  Goal status: initial     PLAN: PT FREQUENCY: 1-2x/week  PT DURATION: 8 weeks  PLANNED INTERVENTIONS: 97110-Therapeutic exercises, 97530- Therapeutic activity, [id number]- Neuromuscular re-education, 97535- Self Care, [id number]- Manual therapy, [phone]- Aquatic Therapy, Patient/Family education, Balance training, and Stair training  PLAN FOR NEXT SESSION: HEP assessment and progression, symptom modulation, and loading (isolated and/or functional). Manual therapy and NME training as needed.    Alm JAYSON Kingdom PT  04/29/2024 2:44 PM   "

## 2024-05-01 ENCOUNTER — Ambulatory Visit

## 2024-05-01 DIAGNOSIS — M6281 Muscle weakness (generalized): Secondary | ICD-10-CM

## 2024-05-01 DIAGNOSIS — M25552 Pain in left hip: Secondary | ICD-10-CM

## 2024-05-01 DIAGNOSIS — R1031 Right lower quadrant pain: Secondary | ICD-10-CM

## 2024-05-01 NOTE — Therapy (Signed)
 " OUTPATIENT PHYSICAL THERAPY TREATMENT   Patient Name: Greg Flowers MRN: [id number] DOB:09/01/1955, 69 y.o., male Today's Date: 05/01/2024  END OF SESSION:  PT End of Session - 05/01/24 1150     Visit Number 3    Number of Visits 16    Date for Recertification  06/07/24    PT Start Time 1149    PT Stop Time 1229    PT Time Calculation (min) 40 min    Activity Tolerance Patient tolerated treatment well    Behavior During Therapy [facility] for tasks assessed/performed          Past Medical History:  Diagnosis Date   Anxiety    Calculus, kidney 2000   Sees Dr. Alline, urology.    Cervical spondylosis 2004   sp surgery by Dr. Colon   Depression    GERD (gastroesophageal reflux disease)    Headache(784.0)    Chronic, on vicodin 5 TID for this.    Hepatitis C    Has occasional visits with Dr. Celestia GI, failed RX in 2000.  Liver Biopsy 9/06: Minimally active hepatitis consistent with hepatitis C, minimal necroinflamtory activity grade 1, no incrased fibrosis stage 0.    Herpes labialis    HERPES LABIALIS 04/04/2006   Qualifier: Diagnosis of  By: Sharl MD, Jackquline     History of kidney stones    Hypertension    Pneumonia    Renal stone    Spondylolisthesis of lumbar region    Thalassemia    HGB 9/09 14.4 wtih MCV 72.6   Viral upper respiratory illness 04/12/2021   Wears glasses    Past Surgical History:  Procedure Laterality Date   ANTERIOR CERVICAL DECOMP/DISCECTOMY FUSION N/A 11/21/2017   Procedure: ANTERIOR CERVICAL DECOMPRESSION FUSION, CERVICAL THREE-FOUR, CERVICAL FOUR-FIVE WITH INSTRUMENTATION AND ALLOGRAFT;  Surgeon: Beuford Anes, MD;  Location: [facility] OR;  Service: Orthopedics;  Laterality: N/A;  ANTERIOR CERVICAL DECOMPRESSION FUSION, CERVICAL THREE-FOUR, CERVICAL FOUR-FIVE WITH INSTRUMENTATION AND ALLOGRAFT   BACK SURGERY  2017   L4-L5 PLATE/SCREWS   CERVICAL DISCECTOMY  2004   Dr Colon   COLONOSCOPY     LITHOTRIPSY  2004   Dr Janit   NASAL SEPTUM SURGERY   2007   TONSILLECTOMY     TOTAL HIP ARTHROPLASTY Left 11/18/2018   Procedure: Left Anterior Hip Arthroplasty;  Surgeon: Sheril Coy, MD;  Location: [facility];  Service: Orthopedics;  Laterality: Left;   Patient Active Problem List   Diagnosis Date Noted   Lateral epicondylitis of right elbow 03/02/2022   Hyperlipidemia 12/30/2020   Alpha thalassaemia minor 05/25/2020   Healthcare maintenance 05/25/2020   Acute gout due to renal impairment involving toe 05/20/2019   Neuropathy 05/18/2019   Primary localized osteoarthritis of left hip 11/18/2018   Primary osteoarthritis of left hip 11/18/2018   Microcytic anemia 08/13/2018   Disc disease, degenerative, cervical 11/01/2017   Liver fibrosis 04/30/2017   NSAID long-term use 01/09/2016   Chronic back pain secondary to DJD of cervical, thoracic and lumbar spine and disc herniation 01/09/2016   Osteoarthritis of both knees 11/02/2011   DISORDER, DEPRESSIVE NEC 09/25/2006   Chronic hepatitis C without hepatic coma (HCC) 02/22/2006   Essential hypertension 02/22/2006    PCP: Celestia Rosaline SQUIBB, NP Ref Provider (PCP)   REFERRING PROVIDER: Celestia Rosaline SQUIBB, NP Ref Provider (PCP)   REFERRING DIAG:  W19.XXXD (ICD-10-CM) - Fall, subsequent encounter R07.89 (ICD-10-CM) - Rib pain on right side   THERAPY DIAG:  Muscle weakness (generalized)  Abdominal  wall pain in right lower quadrant  Pain in left hip  Rationale for Evaluation and Treatment: rehabilitation  ONSET DATE: October (wheelchair fall), November (fishing and tripped and fell on R side)  SUBJECTIVE:                                                                                                                                                                                      SUBJECTIVE STATEMENT: Patient reports that he felt good after the land session this week, he enjoyed working on the machines. Notes that injection is still helping his hip pain and that he sees a  new orthopedic MD next week as his previous one retired.    PERTINENT HISTORY: Negative results for fracture from imaging.   PAIN:  10W Are you having pain?  Yes: NPRS scale: 10 Pain location: R rib pain, L hip, LB Pain description: dull ache Aggravating factors: movement Relieving factors: rest  PRECAUTIONS: None  RED FLAGS: None   WEIGHT BEARING RESTRICTIONS: No  FALLS:  Has patient fallen in last 6 months? Yes. Number of falls 2  OCCUPATION: N/a  PLOF: Independent  PATIENT GOALS: improve mobility, decrease pain   OBJECTIVE:  Note: Objective measures were completed at Evaluation unless otherwise noted.   PATIENT SURVEYS :  PSFS: THE PATIENT SPECIFIC FUNCTIONAL SCALE  Place score of 0-10 (0 = unable to perform activity and 10 = able to perform activity at the same level as before injury or problem)  Activity Date: 04/06/24    Improve my mobility 3    2. Overall increase my strength in my body 5    3. Increase my flexibility in my body 3    4.      Total Score 11      Total Score = Sum of activity scores/number of activities  Minimally Detectable Change: 3 points (for single activity); 2 points (for average score)  [employer] [facility] (nd). The Patient Specific Functional Scale . Retrieved from [employer].pt   COGNITION: Overall cognitive status: Within functional limits for tasks assessed     SENSATION: WFL  POSTURE: No Significant postural limitations, rounded shoulders, and forward head    BL UE wfl Lumbar rom WFL UE strength WFL   UPPER EXTREMITY ROM:   Active ROM Right eval Left eval  Shoulder flexion wfl wfl  Shoulder extension    Shoulder abduction    Shoulder adduction    Shoulder internal rotation    Shoulder external rotation    Elbow flexion    Elbow extension    Wrist flexion    Wrist extension    Wrist ulnar deviation  Wrist radial deviation    Wrist  pronation    Wrist supination    (Blank rows = not tested)   LOWER EXTREMITY MMT: Hip flexion 4- BL Hip abduction 4- BL Hip adduction 4- BL  Knee extension 4+ BL Knee flexion 3+ BL  UPPER EXTREMITY MMT:  MMT Right eval Left eval  Shoulder flexion wfl wfl  Shoulder extension    Shoulder abduction    Shoulder adduction    Shoulder internal rotation    Shoulder external rotation    Middle trapezius    Lower trapezius    Elbow flexion    Elbow extension    Wrist flexion    Wrist extension    Wrist ulnar deviation    Wrist radial deviation    Wrist pronation    Wrist supination    Grip strength (lbs)    (Blank rows = not tested)     JOINT MOBILITY TESTING:  N/a  PALPATION:  TTP R oblique area, L hip joint                                                                                        TREATMENT DATE:  [facility] Adult PT Treatment:                                                DATE: 05/01/24 Aquatic therapy at [facility]- [address] - therapeutic pool temp approximately 91 degrees. Pt enters building ambulating independently. Treatment took place in water 3.8 to  4 ft 8 in. deep depending upon activity.  Pt entered and exited the pool via stair and handrails ambulating.  Aquatic Exercise: Walking forward/backwards/side stepping Side stepping pink bells shoulder abd/add x2 laps Step ups bottom step fwd/lat x10 BIL Suitcase carry fwd/bwd walking with pink bells x3 laps Standing with UE support edge of pool: Hip abd/add x20 BIL Hamstring curl x20 BIL Squats x20 Heel/toe raises x20 Hip ext/flex with knee straight x 20 BIL Hip Circles CW/CCW x10 each BIL  Pt requires the buoyancy of water for active assisted exercises with buoyancy supported for strengthening and AROM exercises. Hydrostatic pressure also supports joints by unweighting joint load by at least 50 % in 3-4 feet depth water. 80% in chest to neck deep water. Water will provide assistance with  movement using the current and laminar flow while the buoyancy reduces weight bearing. Pt requires the viscosity of the water for resistance with strengthening exercises.  TREATMENT 04/29/2024 NuStep lvl 5 UE/LE x 4 min while taking subjective LTR x 10 - 5 hold Bridge 2x10 Supine SLR 2x10 each STS 2x10 10# KB FM row 3x8 17# Pallof press 2x10 7# Functional squat 2x10 B UE  [facility] Adult PT Treatment:                                                DATE: 04/24/24 Aquatic  therapy at [facility]- [address] - therapeutic pool temp approximately 91 degrees. Pt enters building ambulating independently. Treatment took place in water 3.8 to  4 ft 8 in. deep depending upon activity.  Pt entered and exited the pool via stair and handrails ambulating. Patient entered water for aquatic therapy for first time and was introduced to principles and therapeutic effects of water as they ambulated and acclimated to pool.  Aquatic Exercise: Walking forward/backwards/side stepping Side stepping pink bells shoulder abd/add x2 laps Lunge stance pink bell shoulder flex/ext, abd/add x10 ea BIL Step ups bottom step fwd x10 BIL Standing with UE support edge of pool: Hip abd/add x20 BIL Hamstring curl x20 BIL Squats x20 Heel/toe raises x20 Hip ext/flex with knee straight x 20 BIL Hip Circles CW/CCW x10 each BIL  Pt requires the buoyancy of water for active assisted exercises with buoyancy supported for strengthening and AROM exercises. Hydrostatic pressure also supports joints by unweighting joint load by at least 50 % in 3-4 feet depth water. 80% in chest to neck deep water. Water will provide assistance with movement using the current and laminar flow while the buoyancy reduces weight bearing. Pt requires the viscosity of the water for resistance with strengthening exercises.   PATIENT EDUCATION: Education details: HEP Person educated: Patient Education method: Programmer, Multimedia, Facilities Manager, and  Handouts Education comprehension: verbalized understanding and returned demonstration  HOME EXERCISE PROGRAM: 5x/wk, 2x/day Seated trunk rotation x6x3s Seated side bend x6x3s  ASSESSMENT:  CLINICAL IMPRESSION: Patient presents to aquatics PT session reporting improved hip pain since receiving injection and that he didn't have any worse pain after land session this week. He states that he sees a new orthopedic MD next week as his previous one recently retired. Session today focused on hip strengthening and improving overall standing activity tolerance in the aquatic environment for use of buoyancy to offload joints and the viscosity of water as resistance during therapeutic exercise. Patient was able to tolerate all prescribed exercises in the aquatic environment with no adverse effects. Patient continues to benefit from skilled PT services on land and aquatic based and should be progressed as able to improve functional independence.   EVAL: Patient is a 69 year old male who presents with R oblique and L hip pain after fall w/ suspected acute strain from trauma. Patient presents with deficits in: excessive pain and functional strength of BL LE. As a result, the patient would benefit from skilled PT to address aforementioned deficits via plan below.    OBJECTIVE IMPAIRMENTS: decreased strength and pain.   ACTIVITY LIMITATIONS: carrying, lifting, and squatting  PERSONAL FACTORS: Age, Time since onset of injury/illness/exacerbation, and 3+ comorbidities:   are also affecting patient's functional outcome.   REHAB POTENTIAL: Fair    CLINICAL DECISION MAKING: Evolving/moderate complexity  EVALUATION COMPLEXITY: Moderate  GOALS: Goals reviewed with patient? No  SHORT TERM GOALS: Target date: 04/27/2024  1) Patient will demonstrate 75% HEP compliance to show independence with self-management of condition Baseline: 0% Goal status: Ongoing  2) Patient will decrease worst pain to 8 at most  to improve ADL completion and overall QOL Baseline: 10 Goal status: Progressing    LONG TERM GOALS: Target date: 06/07/24   1) Patient will demonstrate 100% HEP compliance to show independence with self-management of condition   Baseline: 0% Goal status: INITIAL  2) Patient will decrease worst pain to 6 at most to improve ADL completion and overall QOL   Baseline: 10 Goal status: INITIAL  3) Patient will  demonstrate a 6 point improvement in PSFS to show improvements in ADL completion and overall QOL Baseline: 11 Goal status: INITIAL  4) Patient will demonstrate at least 4/5 MMT score of BL hips and LE to show improvements in muscle strength needed for ADL completion.    Baseline: see chart  Goal status: initial   PLAN: PT FREQUENCY: 1-2x/week  PT DURATION: 8 weeks  PLANNED INTERVENTIONS: 97110-Therapeutic exercises, 97530- Therapeutic activity, [id number]- Neuromuscular re-education, 97535- Self Care, [id number]- Manual therapy, [phone]- Aquatic Therapy, Patient/Family education, Balance training, and Stair training  PLAN FOR NEXT SESSION: HEP assessment and progression, symptom modulation, and loading (isolated and/or functional). Manual therapy and NME training as needed.    Corean Pouch PTA  05/01/24 12:27 PM   "

## 2024-05-05 ENCOUNTER — Other Ambulatory Visit (INDEPENDENT_AMBULATORY_CARE_PROVIDER_SITE_OTHER): Payer: Self-pay | Admitting: Primary Care

## 2024-05-05 DIAGNOSIS — K2100 Gastro-esophageal reflux disease with esophagitis, without bleeding: Secondary | ICD-10-CM

## 2024-05-05 DIAGNOSIS — Z760 Encounter for issue of repeat prescription: Secondary | ICD-10-CM

## 2024-05-05 NOTE — Telephone Encounter (Signed)
 Will forward to provider

## 2024-05-06 ENCOUNTER — Encounter: Payer: Self-pay | Admitting: Physical Therapy

## 2024-05-06 ENCOUNTER — Ambulatory Visit: Admitting: Physical Therapy

## 2024-05-06 ENCOUNTER — Ambulatory Visit

## 2024-05-06 DIAGNOSIS — M6281 Muscle weakness (generalized): Secondary | ICD-10-CM

## 2024-05-06 DIAGNOSIS — R1031 Right lower quadrant pain: Secondary | ICD-10-CM

## 2024-05-06 DIAGNOSIS — M5459 Other low back pain: Secondary | ICD-10-CM

## 2024-05-06 DIAGNOSIS — M25552 Pain in left hip: Secondary | ICD-10-CM

## 2024-05-06 NOTE — Therapy (Signed)
 " OUTPATIENT PHYSICAL THERAPY TREATMENT   Patient Name: Greg Flowers MRN: [id number] DOB:06-Aug-1955, 69 y.o., male Today's Date: 05/06/2024  END OF SESSION:  PT End of Session - 05/06/24 1258     Visit Number 4    Number of Visits 16    Date for Recertification  06/07/24    PT Start Time 1255    PT Stop Time 1333    PT Time Calculation (min) 38 min    Activity Tolerance Patient tolerated treatment well    Behavior During Therapy [facility] for tasks assessed/performed           Past Medical History:  Diagnosis Date   Anxiety    Calculus, kidney 2000   Sees Dr. Alline, urology.    Cervical spondylosis 2004   sp surgery by Dr. Colon   Depression    GERD (gastroesophageal reflux disease)    Headache(784.0)    Chronic, on vicodin 5 TID for this.    Hepatitis C    Has occasional visits with Dr. Celestia [facility], failed RX in 2000.  Liver Biopsy 9/06: Minimally active hepatitis consistent with hepatitis C, minimal necroinflamtory activity grade 1, no incrased fibrosis stage 0.    Herpes labialis    HERPES LABIALIS 04/04/2006   Qualifier: Diagnosis of  By: Sharl MD, Jackquline     History of kidney stones    Hypertension    Pneumonia    Renal stone    Spondylolisthesis of lumbar region    Thalassemia    HGB 9/09 14.4 wtih MCV 72.6   Viral upper respiratory illness 04/12/2021   Wears glasses    Past Surgical History:  Procedure Laterality Date   ANTERIOR CERVICAL DECOMP/DISCECTOMY FUSION N/A 11/21/2017   Procedure: ANTERIOR CERVICAL DECOMPRESSION FUSION, CERVICAL THREE-FOUR, CERVICAL FOUR-FIVE WITH INSTRUMENTATION AND ALLOGRAFT;  Surgeon: Beuford Anes, MD;  Location: [facility] OR;  Service: Orthopedics;  Laterality: N/A;  ANTERIOR CERVICAL DECOMPRESSION FUSION, CERVICAL THREE-FOUR, CERVICAL FOUR-FIVE WITH INSTRUMENTATION AND ALLOGRAFT   BACK SURGERY  2017   L4-L5 PLATE/SCREWS   CERVICAL DISCECTOMY  2004   Dr Colon   COLONOSCOPY     LITHOTRIPSY  2004   Dr Janit   NASAL SEPTUM SURGERY   2007   TONSILLECTOMY     TOTAL HIP ARTHROPLASTY Left 11/18/2018   Procedure: Left Anterior Hip Arthroplasty;  Surgeon: Sheril Coy, MD;  Location: [facility];  Service: Orthopedics;  Laterality: Left;   Patient Active Problem List   Diagnosis Date Noted   Lateral epicondylitis of right elbow 03/02/2022   Hyperlipidemia 12/30/2020   Alpha thalassaemia minor 05/25/2020   Healthcare maintenance 05/25/2020   Acute gout due to renal impairment involving toe 05/20/2019   Neuropathy 05/18/2019   Primary localized osteoarthritis of left hip 11/18/2018   Primary osteoarthritis of left hip 11/18/2018   Microcytic anemia 08/13/2018   Disc disease, degenerative, cervical 11/01/2017   Liver fibrosis 04/30/2017   NSAID long-term use 01/09/2016   Chronic back pain secondary to DJD of cervical, thoracic and lumbar spine and disc herniation 01/09/2016   Osteoarthritis of both knees 11/02/2011   DISORDER, DEPRESSIVE NEC 09/25/2006   Chronic hepatitis C without hepatic coma (HCC) 02/22/2006   Essential hypertension 02/22/2006    PCP: Celestia Rosaline SQUIBB, NP Ref Provider (PCP)   REFERRING PROVIDER: Celestia Rosaline SQUIBB, NP Ref Provider (PCP)   REFERRING DIAG:  W19.XXXD (ICD-10-CM) - Fall, subsequent encounter R07.89 (ICD-10-CM) - Rib pain on right side   THERAPY DIAG:  Muscle weakness (generalized)  Abdominal wall pain in right lower quadrant  Pain in left hip  Other low back pain  Rationale for Evaluation and Treatment: rehabilitation  ONSET DATE: October (wheelchair fall), November (fishing and tripped and fell on R side)  SUBJECTIVE:                                                                                                                                                                                      SUBJECTIVE STATEMENT: Pt reports that he had an injection in his hip and he now rates his pain 1/10   PERTINENT HISTORY: Negative results for fracture from imaging.    PAIN:  10W Are you having pain?  Yes: NPRS scale: 1/10 Pain location: R rib pain, L hip, LB Pain description: dull ache Aggravating factors: movement Relieving factors: rest  PRECAUTIONS: None  RED FLAGS: None   WEIGHT BEARING RESTRICTIONS: No  FALLS:  Has patient fallen in last 6 months? Yes. Number of falls 2  OCCUPATION: N/a  PLOF: Independent  PATIENT GOALS: improve mobility, decrease pain   OBJECTIVE:  Note: Objective measures were completed at Evaluation unless otherwise noted.   PATIENT SURVEYS :  PSFS: THE PATIENT SPECIFIC FUNCTIONAL SCALE  Place score of 0-10 (0 = unable to perform activity and 10 = able to perform activity at the same level as before injury or problem)  Activity Date: 04/06/24    Improve my mobility 3    2. Overall increase my strength in my body 5    3. Increase my flexibility in my body 3    4.      Total Score 11      Total Score = Sum of activity scores/number of activities  Minimally Detectable Change: 3 points (for single activity); 2 points (for average score)  [employer] (nd). The Patient Specific Functional Scale . Retrieved from [email or web address]   COGNITION: Overall cognitive status: Within functional limits for tasks assessed     SENSATION: WFL  POSTURE: No Significant postural limitations, rounded shoulders, and forward head    BL UE wfl Lumbar rom WFL UE strength WFL   UPPER EXTREMITY ROM:   Active ROM Right eval Left eval  Shoulder flexion wfl wfl  Shoulder extension    Shoulder abduction    Shoulder adduction    Shoulder internal rotation    Shoulder external rotation    Elbow flexion    Elbow extension    Wrist flexion    Wrist extension    Wrist ulnar deviation    Wrist radial deviation    Wrist pronation    Wrist supination    (Blank rows = not  tested)   LOWER EXTREMITY MMT: Hip flexion 4- BL Hip abduction 4-  BL Hip adduction 4- BL  Knee extension 4+ BL Knee flexion 3+ BL  UPPER EXTREMITY MMT:  MMT Right eval Left eval  Shoulder flexion wfl wfl  Shoulder extension    Shoulder abduction    Shoulder adduction    Shoulder internal rotation    Shoulder external rotation    Middle trapezius    Lower trapezius    Elbow flexion    Elbow extension    Wrist flexion    Wrist extension    Wrist ulnar deviation    Wrist radial deviation    Wrist pronation    Wrist supination    Grip strength (lbs)    (Blank rows = not tested)     JOINT MOBILITY TESTING:  N/a  PALPATION:  TTP R oblique area, L hip joint                                                                                        TREATMENT DATE:  [facility] Adult PT Treatment:                                                DATE: 05/06/24 Aquatic therapy at [facility]- [address] - therapeutic pool temp approximately 91 degrees. Pt enters building ambulating independently. Treatment took place in water 3.8 to  4 ft 8 in. deep depending upon activity.  Pt entered and exited the pool via stair and handrails ambulating.  Aquatic Exercise: Walking forward/backwards/side stepping Step ups bottom step fwd/lat x10 BIL Lateral walking with red bells - shoulder abd/add Suitcase carry fwd/bwd walking YDB x3 laps Standing with UE support edge of pool: Hip abd/add x20 BIL Squats x20 - YDB support SL heel raises - 2x10 ea Hip ext/flex with knee straight x 20 BIL Hip Circles CW/CCW x10 each BIL  Pt requires the buoyancy of water for active assisted exercises with buoyancy supported for strengthening and AROM exercises. Hydrostatic pressure also supports joints by unweighting joint load by at least 50 % in 3-4 feet depth water. 80% in chest to neck deep water. Water will provide assistance with movement using the current and laminar flow while the buoyancy reduces weight bearing. Pt requires the viscosity of the water for resistance  with strengthening exercises.  TREATMENT 04/29/2024 NuStep lvl 5 UE/LE x 4 min while taking subjective LTR x 10 - 5 hold Bridge 2x10 Supine SLR 2x10 each STS 2x10 10# KB FM row 3x8 17# Pallof press 2x10 7# Functional squat 2x10 B UE  [facility] Adult PT Treatment:                                                DATE: 04/24/24 Aquatic therapy at [facility]- [address] - therapeutic pool temp approximately 91 degrees. Pt enters building ambulating independently. Treatment took  place in water 3.8 to  4 ft 8 in. deep depending upon activity.  Pt entered and exited the pool via stair and handrails ambulating. Patient entered water for aquatic therapy for first time and was introduced to principles and therapeutic effects of water as they ambulated and acclimated to pool.  Aquatic Exercise: Walking forward/backwards/side stepping Side stepping pink bells shoulder abd/add x2 laps Lunge stance pink bell shoulder flex/ext, abd/add x10 ea BIL Step ups bottom step fwd x10 BIL Standing with UE support edge of pool: Hip abd/add x20 BIL Hamstring curl x20 BIL Squats x20 Heel/toe raises x20 Hip ext/flex with knee straight x 20 BIL Hip Circles CW/CCW x10 each BIL  Pt requires the buoyancy of water for active assisted exercises with buoyancy supported for strengthening and AROM exercises. Hydrostatic pressure also supports joints by unweighting joint load by at least 50 % in 3-4 feet depth water. 80% in chest to neck deep water. Water will provide assistance with movement using the current and laminar flow while the buoyancy reduces weight bearing. Pt requires the viscosity of the water for resistance with strengthening exercises.   PATIENT EDUCATION: Education details: HEP Person educated: Patient Education method: Programmer, Multimedia, Facilities Manager, and Handouts Education comprehension: verbalized understanding and returned demonstration  HOME EXERCISE PROGRAM: 5x/wk, 2x/day Seated trunk rotation  x6x3s Seated side bend x6x3s  ASSESSMENT:  CLINICAL IMPRESSION: Session today focused on hip and core strengthening in the aquatic environment for use of buoyancy to offload joints and the viscosity of water as resistance during therapeutic exercise.  Pt with lower pain today following hip injection and was able to increase intensity of several exercises including squat and bil HR -> SL heel raise.  Patient was able to tolerate all prescribed exercises in the aquatic environment with no adverse effects and reports no increase in pain at the end of the session. Patient continues to benefit from skilled PT services on land and aquatic based and should be progressed as able to improve functional independence.   EVAL: Patient is a 69 year old male who presents with R oblique and L hip pain after fall w/ suspected acute strain from trauma. Patient presents with deficits in: excessive pain and functional strength of BL LE. As a result, the patient would benefit from skilled PT to address aforementioned deficits via plan below.    OBJECTIVE IMPAIRMENTS: decreased strength and pain.   ACTIVITY LIMITATIONS: carrying, lifting, and squatting  PERSONAL FACTORS: Age, Time since onset of injury/illness/exacerbation, and 3+ comorbidities:   are also affecting patient's functional outcome.   REHAB POTENTIAL: Fair    CLINICAL DECISION MAKING: Evolving/moderate complexity  EVALUATION COMPLEXITY: Moderate  GOALS: Goals reviewed with patient? No  SHORT TERM GOALS: Target date: 04/27/2024  1) Patient will demonstrate 75% HEP compliance to show independence with self-management of condition Baseline: 0% Goal status: Ongoing  2) Patient will decrease worst pain to 8 at most to improve ADL completion and overall QOL Baseline: 10 Goal status: Progressing    LONG TERM GOALS: Target date: 06/07/24   1) Patient will demonstrate 100% HEP compliance to show independence with self-management of  condition   Baseline: 0% Goal status: INITIAL  2) Patient will decrease worst pain to 6 at most to improve ADL completion and overall QOL   Baseline: 10 Goal status: INITIAL  3) Patient will demonstrate a 6 point improvement in PSFS to show improvements in ADL completion and overall QOL Baseline: 11 Goal status: INITIAL  4) Patient will demonstrate at  least 4/5 MMT score of BL hips and LE to show improvements in muscle strength needed for ADL completion.    Baseline: see chart  Goal status: initial   PLAN: PT FREQUENCY: 1-2x/week  PT DURATION: 8 weeks  PLANNED INTERVENTIONS: 97110-Therapeutic exercises, 97530- Therapeutic activity, [id number]- Neuromuscular re-education, 97535- Self Care, [id number]- Manual therapy, [phone]- Aquatic Therapy, Patient/Family education, Balance training, and Stair training  PLAN FOR NEXT SESSION: HEP assessment and progression, symptom modulation, and loading (isolated and/or functional). Manual therapy and NME training as needed.    Aronda Burford E Cailan Antonucci PT  05/06/24 2:12 PM   "

## 2024-05-08 ENCOUNTER — Ambulatory Visit: Admitting: Physical Therapy

## 2024-05-08 ENCOUNTER — Encounter: Payer: Self-pay | Admitting: Physical Therapy

## 2024-05-08 ENCOUNTER — Ambulatory Visit

## 2024-05-08 DIAGNOSIS — M6281 Muscle weakness (generalized): Secondary | ICD-10-CM | POA: Diagnosis not present

## 2024-05-08 DIAGNOSIS — R1031 Right lower quadrant pain: Secondary | ICD-10-CM

## 2024-05-08 NOTE — Therapy (Signed)
 " OUTPATIENT PHYSICAL THERAPY TREATMENT   Patient Name: Greg Flowers MRN: [id number] DOB:February 11, 1956, 69 y.o., male Today's Date: 05/08/2024  END OF SESSION:  PT End of Session - 05/08/24 1232     Visit Number 5    Number of Visits 16    Date for Recertification  06/07/24    PT Start Time 1230    PT Stop Time 1308    PT Time Calculation (min) 38 min           Past Medical History:  Diagnosis Date   Anxiety    Calculus, kidney 2000   Sees Dr. Alline, urology.    Cervical spondylosis 2004   sp surgery by Dr. Colon   Depression    GERD (gastroesophageal reflux disease)    Headache(784.0)    Chronic, on vicodin 5 TID for this.    Hepatitis C    Has occasional visits with Dr. Celestia [facility], failed RX in 2000.  Liver Biopsy 9/06: Minimally active hepatitis consistent with hepatitis C, minimal necroinflamtory activity grade 1, no incrased fibrosis stage 0.    Herpes labialis    HERPES LABIALIS 04/04/2006   Qualifier: Diagnosis of  By: Sharl MD, Jackquline     History of kidney stones    Hypertension    Pneumonia    Renal stone    Spondylolisthesis of lumbar region    Thalassemia    HGB 9/09 14.4 wtih MCV 72.6   Viral upper respiratory illness 04/12/2021   Wears glasses    Past Surgical History:  Procedure Laterality Date   ANTERIOR CERVICAL DECOMP/DISCECTOMY FUSION N/A 11/21/2017   Procedure: ANTERIOR CERVICAL DECOMPRESSION FUSION, CERVICAL THREE-FOUR, CERVICAL FOUR-FIVE WITH INSTRUMENTATION AND ALLOGRAFT;  Surgeon: Beuford Anes, MD;  Location: [facility] OR;  Service: Orthopedics;  Laterality: N/A;  ANTERIOR CERVICAL DECOMPRESSION FUSION, CERVICAL THREE-FOUR, CERVICAL FOUR-FIVE WITH INSTRUMENTATION AND ALLOGRAFT   BACK SURGERY  2017   L4-L5 PLATE/SCREWS   CERVICAL DISCECTOMY  2004   Dr Colon   COLONOSCOPY     LITHOTRIPSY  2004   Dr Janit   NASAL SEPTUM SURGERY  2007   TONSILLECTOMY     TOTAL HIP ARTHROPLASTY Left 11/18/2018   Procedure: Left Anterior Hip Arthroplasty;   Surgeon: Sheril Coy, MD;  Location: [facility];  Service: Orthopedics;  Laterality: Left;   Patient Active Problem List   Diagnosis Date Noted   Lateral epicondylitis of right elbow 03/02/2022   Hyperlipidemia 12/30/2020   Alpha thalassaemia minor 05/25/2020   Healthcare maintenance 05/25/2020   Acute gout due to renal impairment involving toe 05/20/2019   Neuropathy 05/18/2019   Primary localized osteoarthritis of left hip 11/18/2018   Primary osteoarthritis of left hip 11/18/2018   Microcytic anemia 08/13/2018   Disc disease, degenerative, cervical 11/01/2017   Liver fibrosis 04/30/2017   NSAID long-term use 01/09/2016   Chronic back pain secondary to DJD of cervical, thoracic and lumbar spine and disc herniation 01/09/2016   Osteoarthritis of both knees 11/02/2011   DISORDER, DEPRESSIVE NEC 09/25/2006   Chronic hepatitis C without hepatic coma (HCC) 02/22/2006   Essential hypertension 02/22/2006    PCP: Celestia Rosaline SQUIBB, NP Ref Provider (PCP)   REFERRING PROVIDER: Celestia Rosaline SQUIBB, NP Ref Provider (PCP)   REFERRING DIAG:  W19.XXXD (ICD-10-CM) - Fall, subsequent encounter R07.89 (ICD-10-CM) - Rib pain on right side   THERAPY DIAG:  Muscle weakness (generalized)  Abdominal wall pain in right lower quadrant  Rationale for Evaluation and Treatment: rehabilitation  ONSET DATE: October (wheelchair  fall), November (fishing and tripped and fell on R side)  SUBJECTIVE:                                                                                                                                                                                      SUBJECTIVE STATEMENT: Pt reports that he had an injection in his hip and he now rates his pain 1/10   PERTINENT HISTORY: Negative results for fracture from imaging.  Lumbar fusion x 2 , cervical fusion x 2 , left THA   PAIN:  10W Are you having pain?  Yes: NPRS scale: 2/10 hip, 0/10 LB,  Pain location: R rib pain, L  hip, LB Pain description: dull ache Aggravating factors: movement Relieving factors: rest  PRECAUTIONS: None  RED FLAGS: None   WEIGHT BEARING RESTRICTIONS: No  FALLS:  Has patient fallen in last 6 months? Yes. Number of falls 2  OCCUPATION: N/a  PLOF: Independent  PATIENT GOALS: improve mobility, decrease pain   OBJECTIVE:  Note: Objective measures were completed at Evaluation unless otherwise noted.   PATIENT SURVEYS :  PSFS: THE PATIENT SPECIFIC FUNCTIONAL SCALE  Place score of 0-10 (0 = unable to perform activity and 10 = able to perform activity at the same level as before injury or problem)  Activity Date: 04/06/24    Improve my mobility 3    2. Overall increase my strength in my body 5    3. Increase my flexibility in my body 3    4.      Total Score 11      Total Score = Sum of activity scores/number of activities  Minimally Detectable Change: 3 points (for single activity); 2 points (for average score)  [facility] (nd). The Patient Specific Functional Scale . Retrieved from [employer].com.pt   COGNITION: Overall cognitive status: Within functional limits for tasks assessed     SENSATION: WFL  POSTURE: No Significant postural limitations, rounded shoulders, and forward head    BL UE wfl Lumbar rom WFL UE strength WFL   UPPER EXTREMITY ROM:   Active ROM Right eval Left eval  Shoulder flexion wfl wfl  Shoulder extension    Shoulder abduction    Shoulder adduction    Shoulder internal rotation    Shoulder external rotation    Elbow flexion    Elbow extension    Wrist flexion    Wrist extension    Wrist ulnar deviation    Wrist radial deviation    Wrist pronation    Wrist supination    (Blank rows = not tested)   LOWER EXTREMITY MMT: Hip flexion 4- BL Hip abduction  4- BL Hip adduction 4- BL  Knee extension 4+ BL Knee flexion 3+ BL  UPPER EXTREMITY  MMT:  MMT Right eval Left eval  Shoulder flexion wfl wfl  Shoulder extension    Shoulder abduction    Shoulder adduction    Shoulder internal rotation    Shoulder external rotation    Middle trapezius    Lower trapezius    Elbow flexion    Elbow extension    Wrist flexion    Wrist extension    Wrist ulnar deviation    Wrist radial deviation    Wrist pronation    Wrist supination    Grip strength (lbs)    (Blank rows = not tested)     JOINT MOBILITY TESTING:  N/a  PALPATION:  TTP R oblique area, L hip joint                                                                                        TREATMENT DATE:  [facility] Adult PT Treatment:                                                DATE: 05/08/24 Therapeutic Exercise: Nustep L5 UE/LE x 10 minutes  Supine March 10 x 2  Supine clam GTB 10 x 2  Banded bridge GTB 10 x 2  LTR x 10 Ball squeeze 3 sec  x 8      [facility] Adult PT Treatment:                                                DATE: 05/06/24 Aquatic therapy at [facility]- [address] - therapeutic pool temp approximately 91 degrees. Pt enters building ambulating independently. Treatment took place in water 3.8 to  4 ft 8 in. deep depending upon activity.  Pt entered and exited the pool via stair and handrails ambulating.   Aquatic Exercise: Walking forward/backwards/side stepping Step ups bottom step fwd/lat x10 BIL Lateral walking with red bells - shoulder abd/add Suitcase carry fwd/bwd walking YDB x3 laps Standing with UE support edge of pool: Hip abd/add x20 BIL Squats x20 - YDB support SL heel raises - 2x10 ea Hip ext/flex with knee straight x 20 BIL Hip Circles CW/CCW x10 each BIL  Pt requires the buoyancy of water for active assisted exercises with buoyancy supported for strengthening and AROM exercises. Hydrostatic pressure also supports joints by unweighting joint load by at least 50 % in 3-4 feet depth water. 80% in chest to neck deep water.  Water will provide assistance with movement using the current and laminar flow while the buoyancy reduces weight bearing. Pt requires the viscosity of the water for resistance with strengthening exercises.  TREATMENT 04/29/2024 NuStep lvl 5 UE/LE x 4 min while taking subjective LTR x 10 - 5 hold Bridge 2x10 Supine SLR 2x10 each STS 2x10 10# KB FM row  3x8 17# Pallof press 2x10 7# Functional squat 2x10 B UE  [facility] Adult PT Treatment:                                                DATE: 04/24/24 Aquatic therapy at [facility]- [address] - therapeutic pool temp approximately 91 degrees. Pt enters building ambulating independently. Treatment took place in water 3.8 to  4 ft 8 in. deep depending upon activity.  Pt entered and exited the pool via stair and handrails ambulating. Patient entered water for aquatic therapy for first time and was introduced to principles and therapeutic effects of water as they ambulated and acclimated to pool.  Aquatic Exercise: Walking forward/backwards/side stepping Side stepping pink bells shoulder abd/add x2 laps Lunge stance pink bell shoulder flex/ext, abd/add x10 ea BIL Step ups bottom step fwd x10 BIL Standing with UE support edge of pool: Hip abd/add x20 BIL Hamstring curl x20 BIL Squats x20 Heel/toe raises x20 Hip ext/flex with knee straight x 20 BIL Hip Circles CW/CCW x10 each BIL  Pt requires the buoyancy of water for active assisted exercises with buoyancy supported for strengthening and AROM exercises. Hydrostatic pressure also supports joints by unweighting joint load by at least 50 % in 3-4 feet depth water. 80% in chest to neck deep water. Water will provide assistance with movement using the current and laminar flow while the buoyancy reduces weight bearing. Pt requires the viscosity of the water for resistance with strengthening exercises.   PATIENT EDUCATION: Education details: HEP Person educated: Patient Education method:  Programmer, Multimedia, Facilities Manager, and Handouts Education comprehension: verbalized understanding and returned demonstration  HOME EXERCISE PROGRAM: 5x/wk, 2x/day Seated trunk rotation x6x3s Seated side bend x6x3s   Access Code: [email or web address] URL: [email or web address] Date: 05/08/2024 Prepared by: Harlene Persons  Exercises - Supine Bridge with Resistance Band  - 1 x daily - 7 x weekly - 2 sets - 10 reps - Supine March with Resistance Band  - 1 x daily - 7 x weekly - 2 sets - 10 reps - Supine Lower Trunk Rotation  - 1 x daily - 7 x weekly - 1 sets - 10 reps - 5 hold - Supine Hip Adduction Isometric with Ball  - 1 x daily - 7 x weekly - 2 sets - 10 reps  ASSESSMENT:  CLINICAL IMPRESSION: Session today focused on hip and core strengthening and updating HEP.  Patient was able to tolerate all prescribed exercises with no adverse effects and reports no increase in pain at the end of the session. Pain has been less, 1-2/10 since receiving hip injection. Patient continues to benefit from skilled PT services on land and aquatic based and should be progressed as able to improve functional independence.   EVAL: Patient is a 69 year old male who presents with R oblique and L hip pain after fall w/ suspected acute strain from trauma. Patient presents with deficits in: excessive pain and functional strength of BL LE. As a result, the patient would benefit from skilled PT to address aforementioned deficits via plan below.    OBJECTIVE IMPAIRMENTS: decreased strength and pain.   ACTIVITY LIMITATIONS: carrying, lifting, and squatting  PERSONAL FACTORS: Age, Time since onset of injury/illness/exacerbation, and 3+ comorbidities:   are also affecting patient's functional outcome.   REHAB POTENTIAL: Fair    CLINICAL DECISION MAKING: Evolving/moderate complexity  EVALUATION  COMPLEXITY: Moderate  GOALS: Goals reviewed with patient? No  SHORT TERM GOALS: Target date: 04/27/2024  1) Patient  will demonstrate 75% HEP compliance to show independence with self-management of condition Baseline: 0% Goal status: MET   2) Patient will decrease worst pain to 8 at most to improve ADL completion and overall QOL Baseline: 10 05/08/24: 2/10 since hip injection Goal status: MET     LONG TERM GOALS: Target date: 06/07/24   1) Patient will demonstrate 100% HEP compliance to show independence with self-management of condition   Baseline: 0% Goal status: INITIAL  2) Patient will decrease worst pain to 6 at most to improve ADL completion and overall QOL   Baseline: 10 Goal status: INITIAL  3) Patient will demonstrate a 6 point improvement in PSFS to show improvements in ADL completion and overall QOL Baseline: 11 Goal status: INITIAL  4) Patient will demonstrate at least 4/5 MMT score of BL hips and LE to show improvements in muscle strength needed for ADL completion.    Baseline: see chart  Goal status: initial   PLAN: PT FREQUENCY: 1-2x/week  PT DURATION: 8 weeks  PLANNED INTERVENTIONS: 97110-Therapeutic exercises, 97530- Therapeutic activity, [id number]- Neuromuscular re-education, 97535- Self Care, [id number]- Manual therapy, [phone]- Aquatic Therapy, Patient/Family education, Balance training, and Stair training  PLAN FOR NEXT SESSION: HEP assessment and progression, symptom modulation, and loading (isolated and/or functional). Manual therapy and NME training as needed.    Harlene Persons, PTA 05/08/24 1:07 PM Phone: [phone] Fax: [phone]    "

## 2024-05-08 NOTE — Therapy (Incomplete)
 " OUTPATIENT PHYSICAL THERAPY TREATMENT   Patient Name: Greg RIECKE MRN: [id number] DOB:1955/11/25, 69 y.o., male Today's Date: 05/08/2024  END OF SESSION:     Past Medical History:  Diagnosis Date   Anxiety    Calculus, kidney 2000   Sees Dr. Alline, urology.    Cervical spondylosis 2004   sp surgery by Dr. Colon   Depression    GERD (gastroesophageal reflux disease)    Headache(784.0)    Chronic, on vicodin 5 TID for this.    Hepatitis C    Has occasional visits with Dr. Celestia [facility], failed RX in 2000.  Liver Biopsy 9/06: Minimally active hepatitis consistent with hepatitis C, minimal necroinflamtory activity grade 1, no incrased fibrosis stage 0.    Herpes labialis    HERPES LABIALIS 04/04/2006   Qualifier: Diagnosis of  By: Sharl MD, Jackquline     History of kidney stones    Hypertension    Pneumonia    Renal stone    Spondylolisthesis of lumbar region    Thalassemia    HGB 9/09 14.4 wtih MCV 72.6   Viral upper respiratory illness 04/12/2021   Wears glasses    Past Surgical History:  Procedure Laterality Date   ANTERIOR CERVICAL DECOMP/DISCECTOMY FUSION N/A 11/21/2017   Procedure: ANTERIOR CERVICAL DECOMPRESSION FUSION, CERVICAL THREE-FOUR, CERVICAL FOUR-FIVE WITH INSTRUMENTATION AND ALLOGRAFT;  Surgeon: Beuford Anes, MD;  Location: [facility] OR;  Service: Orthopedics;  Laterality: N/A;  ANTERIOR CERVICAL DECOMPRESSION FUSION, CERVICAL THREE-FOUR, CERVICAL FOUR-FIVE WITH INSTRUMENTATION AND ALLOGRAFT   BACK SURGERY  2017   L4-L5 PLATE/SCREWS   CERVICAL DISCECTOMY  2004   Dr Colon   COLONOSCOPY     LITHOTRIPSY  2004   Dr Janit   NASAL SEPTUM SURGERY  2007   TONSILLECTOMY     TOTAL HIP ARTHROPLASTY Left 11/18/2018   Procedure: Left Anterior Hip Arthroplasty;  Surgeon: Sheril Coy, MD;  Location: [facility];  Service: Orthopedics;  Laterality: Left;   Patient Active Problem List   Diagnosis Date Noted   Lateral epicondylitis of right elbow 03/02/2022   Hyperlipidemia  12/30/2020   Alpha thalassaemia minor 05/25/2020   Healthcare maintenance 05/25/2020   Acute gout due to renal impairment involving toe 05/20/2019   Neuropathy 05/18/2019   Primary localized osteoarthritis of left hip 11/18/2018   Primary osteoarthritis of left hip 11/18/2018   Microcytic anemia 08/13/2018   Disc disease, degenerative, cervical 11/01/2017   Liver fibrosis 04/30/2017   NSAID long-term use 01/09/2016   Chronic back pain secondary to DJD of cervical, thoracic and lumbar spine and disc herniation 01/09/2016   Osteoarthritis of both knees 11/02/2011   DISORDER, DEPRESSIVE NEC 09/25/2006   Chronic hepatitis C without hepatic coma (HCC) 02/22/2006   Essential hypertension 02/22/2006    PCP: Celestia Rosaline SQUIBB, NP Ref Provider (PCP)   REFERRING PROVIDER: Celestia Rosaline SQUIBB, NP Ref Provider (PCP)   REFERRING DIAG:  W19.XXXD (ICD-10-CM) - Fall, subsequent encounter R07.89 (ICD-10-CM) - Rib pain on right side   THERAPY DIAG:  No diagnosis found.  Rationale for Evaluation and Treatment: rehabilitation  ONSET DATE: October (wheelchair fall), November (fishing and tripped and fell on R side)  SUBJECTIVE:  SUBJECTIVE STATEMENT: ***  Pt reports that he had an injection in his hip and he now rates his pain 1/10   PERTINENT HISTORY: Negative results for fracture from imaging.   PAIN:  10W Are you having pain?  Yes: NPRS scale: 1/10 Pain location: R rib pain, L hip, LB Pain description: dull ache Aggravating factors: movement Relieving factors: rest  PRECAUTIONS: None  RED FLAGS: None   WEIGHT BEARING RESTRICTIONS: No  FALLS:  Has patient fallen in last 6 months? Yes. Number of falls 2  OCCUPATION: N/a  PLOF: Independent  PATIENT GOALS: improve mobility, decrease  pain   OBJECTIVE:  Note: Objective measures were completed at Evaluation unless otherwise noted.   PATIENT SURVEYS :  PSFS: THE PATIENT SPECIFIC FUNCTIONAL SCALE  Place score of 0-10 (0 = unable to perform activity and 10 = able to perform activity at the same level as before injury or problem)  Activity Date: 04/06/24    Improve my mobility 3    2. Overall increase my strength in my body 5    3. Increase my flexibility in my body 3    4.      Total Score 11      Total Score = Sum of activity scores/number of activities  Minimally Detectable Change: 3 points (for single activity); 2 points (for average score)  [facility] (nd). The Patient Specific Functional Scale . Retrieved from [employer].com.pt   COGNITION: Overall cognitive status: Within functional limits for tasks assessed     SENSATION: WFL  POSTURE: No Significant postural limitations, rounded shoulders, and forward head    BL UE wfl Lumbar rom WFL UE strength WFL   UPPER EXTREMITY ROM:   Active ROM Right eval Left eval  Shoulder flexion wfl wfl  Shoulder extension    Shoulder abduction    Shoulder adduction    Shoulder internal rotation    Shoulder external rotation    Elbow flexion    Elbow extension    Wrist flexion    Wrist extension    Wrist ulnar deviation    Wrist radial deviation    Wrist pronation    Wrist supination    (Blank rows = not tested)   LOWER EXTREMITY MMT: Hip flexion 4- BL Hip abduction 4- BL Hip adduction 4- BL  Knee extension 4+ BL Knee flexion 3+ BL  UPPER EXTREMITY MMT:  MMT Right eval Left eval  Shoulder flexion wfl wfl  Shoulder extension    Shoulder abduction    Shoulder adduction    Shoulder internal rotation    Shoulder external rotation    Middle trapezius    Lower trapezius    Elbow flexion    Elbow extension    Wrist flexion    Wrist extension    Wrist ulnar deviation     Wrist radial deviation    Wrist pronation    Wrist supination    Grip strength (lbs)    (Blank rows = not tested)     JOINT MOBILITY TESTING:  N/a  PALPATION:  TTP R oblique area, L hip joint  TREATMENT DATE:  [facility] Adult PT Treatment:                                                DATE: 05/08/24 Therapeutic Exercise: NuStep lvl 5 UE/LE x 4 min while taking subjective LTR x 10 - 5 hold Bridge 2x10 Supine SLR 2x10 each Therapeutic Activity: STS 2x10 10# KB FM row 3x8 17# Pallof press 2x10 7# Functional squat 2x10 B UE  [facility] Adult PT Treatment:                                                DATE: 05/06/24 Aquatic therapy at [facility]- [address] - therapeutic pool temp approximately 91 degrees. Pt enters building ambulating independently. Treatment took place in water 3.8 to  4 ft 8 in. deep depending upon activity.  Pt entered and exited the pool via stair and handrails ambulating.  Aquatic Exercise: Walking forward/backwards/side stepping Step ups bottom step fwd/lat x10 BIL Lateral walking with red bells - shoulder abd/add Suitcase carry fwd/bwd walking YDB x3 laps Standing with UE support edge of pool: Hip abd/add x20 BIL Squats x20 - YDB support SL heel raises - 2x10 ea Hip ext/flex with knee straight x 20 BIL Hip Circles CW/CCW x10 each BIL  Pt requires the buoyancy of water for active assisted exercises with buoyancy supported for strengthening and AROM exercises. Hydrostatic pressure also supports joints by unweighting joint load by at least 50 % in 3-4 feet depth water. 80% in chest to neck deep water. Water will provide assistance with movement using the current and laminar flow while the buoyancy reduces weight bearing. Pt requires the viscosity of the water for resistance with strengthening exercises.  TREATMENT 04/29/2024 NuStep lvl 5 UE/LE x 4 min while taking  subjective LTR x 10 - 5 hold Bridge 2x10 Supine SLR 2x10 each STS 2x10 10# KB FM row 3x8 17# Pallof press 2x10 7# Functional squat 2x10 B UE    PATIENT EDUCATION: Education details: HEP Person educated: Patient Education method: Programmer, Multimedia, Demonstration, and Handouts Education comprehension: verbalized understanding and returned demonstration  HOME EXERCISE PROGRAM: 5x/wk, 2x/day Seated trunk rotation x6x3s Seated side bend x6x3s  ASSESSMENT:  CLINICAL IMPRESSION: ***  Session today focused on hip and core strengthening in the aquatic environment for use of buoyancy to offload joints and the viscosity of water as resistance during therapeutic exercise.  Pt with lower pain today following hip injection and was able to increase intensity of several exercises including squat and bil HR -> SL heel raise.  Patient was able to tolerate all prescribed exercises in the aquatic environment with no adverse effects and reports no increase in pain at the end of the session. Patient continues to benefit from skilled PT services on land and aquatic based and should be progressed as able to improve functional independence.   EVAL: Patient is a 69 year old male who presents with R oblique and L hip pain after fall w/ suspected acute strain from trauma. Patient presents with deficits in: excessive pain and functional strength of BL LE. As a result, the patient would benefit from skilled PT to address aforementioned deficits via plan below.    OBJECTIVE IMPAIRMENTS: decreased strength and  pain.   ACTIVITY LIMITATIONS: carrying, lifting, and squatting  PERSONAL FACTORS: Age, Time since onset of injury/illness/exacerbation, and 3+ comorbidities:   are also affecting patient's functional outcome.   REHAB POTENTIAL: Fair    CLINICAL DECISION MAKING: Evolving/moderate complexity  EVALUATION COMPLEXITY: Moderate  GOALS: Goals reviewed with patient? No  SHORT TERM GOALS: Target date:  04/27/2024  1) Patient will demonstrate 75% HEP compliance to show independence with self-management of condition Baseline: 0% Goal status: Ongoing  2) Patient will decrease worst pain to 8 at most to improve ADL completion and overall QOL Baseline: 10 Goal status: Progressing    LONG TERM GOALS: Target date: 06/07/24   1) Patient will demonstrate 100% HEP compliance to show independence with self-management of condition   Baseline: 0% Goal status: INITIAL  2) Patient will decrease worst pain to 6 at most to improve ADL completion and overall QOL   Baseline: 10 Goal status: INITIAL  3) Patient will demonstrate a 6 point improvement in PSFS to show improvements in ADL completion and overall QOL Baseline: 11 Goal status: INITIAL  4) Patient will demonstrate at least 4/5 MMT score of BL hips and LE to show improvements in muscle strength needed for ADL completion.    Baseline: see chart  Goal status: initial   PLAN: PT FREQUENCY: 1-2x/week  PT DURATION: 8 weeks  PLANNED INTERVENTIONS: 97110-Therapeutic exercises, 97530- Therapeutic activity, [id number]- Neuromuscular re-education, 97535- Self Care, 02859- Manual therapy, [phone]- Aquatic Therapy, Patient/Family education, Balance training, and Stair training  PLAN FOR NEXT SESSION: HEP assessment and progression, symptom modulation, and loading (isolated and/or functional). Manual therapy and NME training as needed.    Corean Pouch PTA  05/08/24 8:20 AM   "

## 2024-05-13 ENCOUNTER — Ambulatory Visit

## 2024-05-13 DIAGNOSIS — M6281 Muscle weakness (generalized): Secondary | ICD-10-CM

## 2024-05-13 DIAGNOSIS — M25552 Pain in left hip: Secondary | ICD-10-CM

## 2024-05-13 NOTE — Therapy (Signed)
 " OUTPATIENT PHYSICAL THERAPY TREATMENT   Patient Name: Greg Flowers MRN: [id number] DOB:Apr 17, 1955, 69 y.o., male Today's Date: 05/13/2024  END OF SESSION:  PT End of Session - 05/13/24 1406     Visit Number 6    Number of Visits 16    Date for Recertification  06/07/24    PT Start Time 1400    PT Stop Time 1440    PT Time Calculation (min) 40 min    Activity Tolerance Patient tolerated treatment well            Past Medical History:  Diagnosis Date   Anxiety    Calculus, kidney 2000   Sees Dr. Alline, urology.    Cervical spondylosis 2004   sp surgery by Dr. Colon   Depression    GERD (gastroesophageal reflux disease)    Headache(784.0)    Chronic, on vicodin 5 TID for this.    Hepatitis C    Has occasional visits with Dr. Celestia [facility], failed RX in 2000.  Liver Biopsy 9/06: Minimally active hepatitis consistent with hepatitis C, minimal necroinflamtory activity grade 1, no incrased fibrosis stage 0.    Herpes labialis    HERPES LABIALIS 04/04/2006   Qualifier: Diagnosis of  By: Sharl MD, Jackquline     History of kidney stones    Hypertension    Pneumonia    Renal stone    Spondylolisthesis of lumbar region    Thalassemia    HGB 9/09 14.4 wtih MCV 72.6   Viral upper respiratory illness 04/12/2021   Wears glasses    Past Surgical History:  Procedure Laterality Date   ANTERIOR CERVICAL DECOMP/DISCECTOMY FUSION N/A 11/21/2017   Procedure: ANTERIOR CERVICAL DECOMPRESSION FUSION, CERVICAL THREE-FOUR, CERVICAL FOUR-FIVE WITH INSTRUMENTATION AND ALLOGRAFT;  Surgeon: Beuford Anes, MD;  Location: [facility] OR;  Service: Orthopedics;  Laterality: N/A;  ANTERIOR CERVICAL DECOMPRESSION FUSION, CERVICAL THREE-FOUR, CERVICAL FOUR-FIVE WITH INSTRUMENTATION AND ALLOGRAFT   BACK SURGERY  2017   L4-L5 PLATE/SCREWS   CERVICAL DISCECTOMY  2004   Dr Colon   COLONOSCOPY     LITHOTRIPSY  2004   Dr Janit   NASAL SEPTUM SURGERY  2007   TONSILLECTOMY     TOTAL HIP ARTHROPLASTY Left  11/18/2018   Procedure: Left Anterior Hip Arthroplasty;  Surgeon: Sheril Coy, MD;  Location: [facility];  Service: Orthopedics;  Laterality: Left;   Patient Active Problem List   Diagnosis Date Noted   Lateral epicondylitis of right elbow 03/02/2022   Hyperlipidemia 12/30/2020   Alpha thalassaemia minor 05/25/2020   Healthcare maintenance 05/25/2020   Acute gout due to renal impairment involving toe 05/20/2019   Neuropathy 05/18/2019   Primary localized osteoarthritis of left hip 11/18/2018   Primary osteoarthritis of left hip 11/18/2018   Microcytic anemia 08/13/2018   Disc disease, degenerative, cervical 11/01/2017   Liver fibrosis 04/30/2017   NSAID long-term use 01/09/2016   Chronic back pain secondary to DJD of cervical, thoracic and lumbar spine and disc herniation 01/09/2016   Osteoarthritis of both knees 11/02/2011   DISORDER, DEPRESSIVE NEC 09/25/2006   Chronic hepatitis C without hepatic coma (HCC) 02/22/2006   Essential hypertension 02/22/2006    PCP: Celestia Rosaline SQUIBB, NP Ref Provider (PCP)   REFERRING PROVIDER: Celestia Rosaline SQUIBB, NP Ref Provider (PCP)   REFERRING DIAG:  W19.XXXD (ICD-10-CM) - Fall, subsequent encounter R07.89 (ICD-10-CM) - Rib pain on right side   THERAPY DIAG:  Muscle weakness (generalized)  Pain in left hip  Rationale for Evaluation and  Treatment: rehabilitation  ONSET DATE: October (wheelchair fall), November (fishing and tripped and fell on R side)  SUBJECTIVE:                                                                                                                                                                                      SUBJECTIVE STATEMENT: Pt reports that he had an injection in his hip and he now rates his pain 2/10 right now. Reports doing HEP as prescribed.    PERTINENT HISTORY: Negative results for fracture from imaging.  Lumbar fusion x 2 , cervical fusion x 2 , left THA   PAIN:  10W Are you having  pain?  Yes: NPRS scale: 2/10 hip, 0/10 LB,  Pain location: R rib pain, L hip, LB Pain description: dull ache Aggravating factors: movement Relieving factors: rest  PRECAUTIONS: None  RED FLAGS: None   WEIGHT BEARING RESTRICTIONS: No  FALLS:  Has patient fallen in last 6 months? Yes. Number of falls 2  OCCUPATION: N/a  PLOF: Independent  PATIENT GOALS: improve mobility, decrease pain   OBJECTIVE:  Note: Objective measures were completed at Evaluation unless otherwise noted.   PATIENT SURVEYS :  PSFS: THE PATIENT SPECIFIC FUNCTIONAL SCALE  Place score of 0-10 (0 = unable to perform activity and 10 = able to perform activity at the same level as before injury or problem)  Activity Date: 04/06/24    Improve my mobility 3    2. Overall increase my strength in my body 5    3. Increase my flexibility in my body 3    4.      Total Score 11      Total Score = Sum of activity scores/number of activities  Minimally Detectable Change: 3 points (for single activity); 2 points (for average score)  [facility] (nd). The Patient Specific Functional Scale . Retrieved from [email or web address]   COGNITION: Overall cognitive status: Within functional limits for tasks assessed     SENSATION: WFL  POSTURE: No Significant postural limitations, rounded shoulders, and forward head    BL UE wfl Lumbar rom WFL UE strength WFL   UPPER EXTREMITY ROM:   Active ROM Right eval Left eval  Shoulder flexion wfl wfl  Shoulder extension    Shoulder abduction    Shoulder adduction    Shoulder internal rotation    Shoulder external rotation    Elbow flexion    Elbow extension    Wrist flexion    Wrist extension    Wrist ulnar deviation    Wrist radial deviation    Wrist pronation    Wrist supination    (Blank  rows = not tested)   LOWER EXTREMITY MMT: Hip flexion 4- BL Hip abduction 4- BL Hip adduction  4- BL  Knee extension 4+ BL Knee flexion 3+ BL  UPPER EXTREMITY MMT:  MMT Right eval Left eval  Shoulder flexion wfl wfl  Shoulder extension    Shoulder abduction    Shoulder adduction    Shoulder internal rotation    Shoulder external rotation    Middle trapezius    Lower trapezius    Elbow flexion    Elbow extension    Wrist flexion    Wrist extension    Wrist ulnar deviation    Wrist radial deviation    Wrist pronation    Wrist supination    Grip strength (lbs)    (Blank rows = not tested)     JOINT MOBILITY TESTING:  N/a  PALPATION:  TTP R oblique area, L hip joint                                                                                        TREATMENT DATE:  05/13/24 Therapeutic Exercise/Activity: HEP reassessment and update Seated LTR 2x8x3s Seated GTB clamshell x8x3s, blue TB 2x6x3s Seated hip march 2x8x3s Sit to stand 2x6 Standing calf raises 2x8x3s Standing marches with SUE support 2x8    [facility] Adult PT Treatment:                                                DATE: 05/08/24 Therapeutic Exercise: Nustep L5 UE/LE x 10 minutes  Supine March 10 x 2  Supine clam GTB 10 x 2  Banded bridge GTB 10 x 2  LTR x 10 Ball squeeze 3 sec  x 8      [facility] Adult PT Treatment:                                                DATE: 05/06/24 Aquatic therapy at [facility]- [address] - therapeutic pool temp approximately 91 degrees. Pt enters building ambulating independently. Treatment took place in water 3.8 to  4 ft 8 in. deep depending upon activity.  Pt entered and exited the pool via stair and handrails ambulating.   Aquatic Exercise: Walking forward/backwards/side stepping Step ups bottom step fwd/lat x10 BIL Lateral walking with red bells - shoulder abd/add Suitcase carry fwd/bwd walking YDB x3 laps Standing with UE support edge of pool: Hip abd/add x20 BIL Squats x20 - YDB support SL heel raises - 2x10 ea Hip ext/flex with knee  straight x 20 BIL Hip Circles CW/CCW x10 each BIL  Pt requires the buoyancy of water for active assisted exercises with buoyancy supported for strengthening and AROM exercises. Hydrostatic pressure also supports joints by unweighting joint load by at least 50 % in 3-4 feet depth water. 80% in chest to neck deep water. Water will provide assistance with movement using the  current and laminar flow while the buoyancy reduces weight bearing. Pt requires the viscosity of the water for resistance with strengthening exercises.  TREATMENT 04/29/2024 NuStep lvl 5 UE/LE x 4 min while taking subjective LTR x 10 - 5 hold Bridge 2x10 Supine SLR 2x10 each STS 2x10 10# KB FM row 3x8 17# Pallof press 2x10 7# Functional squat 2x10 B UE  [facility] Adult PT Treatment:                                                DATE: 04/24/24 Aquatic therapy at [facility]- [address] - therapeutic pool temp approximately 91 degrees. Pt enters building ambulating independently. Treatment took place in water 3.8 to  4 ft 8 in. deep depending upon activity.  Pt entered and exited the pool via stair and handrails ambulating. Patient entered water for aquatic therapy for first time and was introduced to principles and therapeutic effects of water as they ambulated and acclimated to pool.  Aquatic Exercise: Walking forward/backwards/side stepping Side stepping pink bells shoulder abd/add x2 laps Lunge stance pink bell shoulder flex/ext, abd/add x10 ea BIL Step ups bottom step fwd x10 BIL Standing with UE support edge of pool: Hip abd/add x20 BIL Hamstring curl x20 BIL Squats x20 Heel/toe raises x20 Hip ext/flex with knee straight x 20 BIL Hip Circles CW/CCW x10 each BIL  Pt requires the buoyancy of water for active assisted exercises with buoyancy supported for strengthening and AROM exercises. Hydrostatic pressure also supports joints by unweighting joint load by at least 50 % in 3-4 feet depth water. 80% in chest to  neck deep water. Water will provide assistance with movement using the current and laminar flow while the buoyancy reduces weight bearing. Pt requires the viscosity of the water for resistance with strengthening exercises.   PATIENT EDUCATION: Education details: HEP Person educated: Patient Education method: Programmer, Multimedia, Facilities Manager, and Handouts Education comprehension: verbalized understanding and returned demonstration  HOME EXERCISE PROGRAM: 5x/wk, 2x/day Seated trunk rotation x6x3s Seated side bend x6x3s   Access Code: [email or web address] URL: [email or web address] Date: 05/08/2024 Prepared by: Harlene Persons  Exercises - Supine Bridge with Resistance Band  - 1 x daily - 7 x weekly - 2 sets - 10 reps - Supine March with Resistance Band  - 1 x daily - 7 x weekly - 2 sets - 10 reps - Supine Lower Trunk Rotation  - 1 x daily - 7 x weekly - 1 sets - 10 reps - 5 hold - Supine Hip Adduction Isometric with Ball  - 1 x daily - 7 x weekly - 2 sets - 10 reps   ASSESSMENT:  CLINICAL IMPRESSION: Session today focused on hip and core strengthening and updating HEP.  Patient was able to tolerate all prescribed exercises with no adverse effects and reports no increase in pain at the end of the session. Pain has been less, 1-2/10 since receiving hip injection. Patient continues to benefit from skilled PT services on land and aquatic based and should be progressed as able to improve functional independence.   EVAL: Patient is a 69 year old male who presents with R oblique and L hip pain after fall w/ suspected acute strain from trauma. Patient presents with deficits in: excessive pain and functional strength of BL LE. As a result, the patient would benefit from skilled PT to address aforementioned deficits via  plan below.    OBJECTIVE IMPAIRMENTS: decreased strength and pain.   ACTIVITY LIMITATIONS: carrying, lifting, and squatting  PERSONAL FACTORS: Age, Time since onset of  injury/illness/exacerbation, and 3+ comorbidities:   are also affecting patient's functional outcome.   REHAB POTENTIAL: Fair    CLINICAL DECISION MAKING: Evolving/moderate complexity  EVALUATION COMPLEXITY: Moderate  GOALS: Goals reviewed with patient? No  SHORT TERM GOALS: Target date: 04/27/2024  1) Patient will demonstrate 75% HEP compliance to show independence with self-management of condition Baseline: 0% Goal status: MET   2) Patient will decrease worst pain to 8 at most to improve ADL completion and overall QOL Baseline: 10 05/08/24: 2/10 since hip injection Goal status: MET     LONG TERM GOALS: Target date: 06/07/24   1) Patient will demonstrate 100% HEP compliance to show independence with self-management of condition   Baseline: 0% Goal status: INITIAL  2) Patient will decrease worst pain to 6 at most to improve ADL completion and overall QOL   Baseline: 10 Goal status: INITIAL  3) Patient will demonstrate a 6 point improvement in PSFS to show improvements in ADL completion and overall QOL Baseline: 11 Goal status: INITIAL  4) Patient will demonstrate at least 4/5 MMT score of BL hips and LE to show improvements in muscle strength needed for ADL completion.    Baseline: see chart  Goal status: initial   PLAN: PT FREQUENCY: 1-2x/week  PT DURATION: 8 weeks  PLANNED INTERVENTIONS: 97110-Therapeutic exercises, 97530- Therapeutic activity, [id number]- Neuromuscular re-education, 97535- Self Care, [id number]- Manual therapy, [phone]- Aquatic Therapy, Patient/Family education, Balance training, and Stair training  PLAN FOR NEXT SESSION: HEP assessment and progression, symptom modulation, and loading (isolated and/or functional). Manual therapy and NME training as needed. Continue with functional strengthening/loading of BL LE/hips.   Greg Flowers  PT, DPT    "

## 2024-05-15 ENCOUNTER — Ambulatory Visit

## 2024-05-15 ENCOUNTER — Other Ambulatory Visit: Payer: Self-pay | Admitting: Primary Care

## 2024-05-15 DIAGNOSIS — M6281 Muscle weakness (generalized): Secondary | ICD-10-CM | POA: Diagnosis not present

## 2024-05-15 DIAGNOSIS — R1031 Right lower quadrant pain: Secondary | ICD-10-CM

## 2024-05-15 DIAGNOSIS — M25552 Pain in left hip: Secondary | ICD-10-CM

## 2024-05-15 NOTE — Telephone Encounter (Signed)
 Pt has hasn't had a lipid since 06/04/23

## 2024-05-19 ENCOUNTER — Encounter (INDEPENDENT_AMBULATORY_CARE_PROVIDER_SITE_OTHER): Payer: Self-pay | Admitting: Primary Care

## 2024-05-20 ENCOUNTER — Encounter: Payer: Self-pay | Admitting: Physical Therapy

## 2024-05-20 ENCOUNTER — Ambulatory Visit: Payer: Self-pay | Admitting: Physical Therapy

## 2024-05-20 DIAGNOSIS — M25552 Pain in left hip: Secondary | ICD-10-CM

## 2024-05-20 DIAGNOSIS — R1031 Right lower quadrant pain: Secondary | ICD-10-CM

## 2024-05-20 DIAGNOSIS — M6281 Muscle weakness (generalized): Secondary | ICD-10-CM

## 2024-05-20 NOTE — Therapy (Signed)
 " OUTPATIENT PHYSICAL THERAPY TREATMENT   Patient Name: Greg Flowers MRN: [id number] DOB:1956/01/18, 69 y.o., male Today's Date: 05/20/2024  END OF SESSION:  PT End of Session - 05/20/24 1250     Visit Number 8    Number of Visits 16    Date for Recertification  06/07/24    PT Start Time 1245    PT Stop Time 1326    PT Time Calculation (min) 41 min    Activity Tolerance Patient tolerated treatment well    Behavior During Therapy [facility] for tasks assessed/performed          Past Medical History:  Diagnosis Date   Anxiety    Calculus, kidney 2000   Sees Dr. Alline, urology.    Cervical spondylosis 2004   sp surgery by Dr. Colon   Depression    GERD (gastroesophageal reflux disease)    Headache(784.0)    Chronic, on vicodin 5 TID for this.    Hepatitis C    Has occasional visits with Dr. Celestia GI, failed RX in 2000.  Liver Biopsy 9/06: Minimally active hepatitis consistent with hepatitis C, minimal necroinflamtory activity grade 1, no incrased fibrosis stage 0.    Herpes labialis    HERPES LABIALIS 04/04/2006   Qualifier: Diagnosis of  By: Sharl MD, Jackquline     History of kidney stones    Hypertension    Pneumonia    Renal stone    Spondylolisthesis of lumbar region    Thalassemia    HGB 9/09 14.4 wtih MCV 72.6   Viral upper respiratory illness 04/12/2021   Wears glasses    Past Surgical History:  Procedure Laterality Date   ANTERIOR CERVICAL DECOMP/DISCECTOMY FUSION N/A 11/21/2017   Procedure: ANTERIOR CERVICAL DECOMPRESSION FUSION, CERVICAL THREE-FOUR, CERVICAL FOUR-FIVE WITH INSTRUMENTATION AND ALLOGRAFT;  Surgeon: Beuford Anes, MD;  Location: [facility] OR;  Service: Orthopedics;  Laterality: N/A;  ANTERIOR CERVICAL DECOMPRESSION FUSION, CERVICAL THREE-FOUR, CERVICAL FOUR-FIVE WITH INSTRUMENTATION AND ALLOGRAFT   BACK SURGERY  2017   L4-L5 PLATE/SCREWS   CERVICAL DISCECTOMY  2004   Dr Colon   COLONOSCOPY     LITHOTRIPSY  2004   Dr Janit   NASAL SEPTUM SURGERY   2007   TONSILLECTOMY     TOTAL HIP ARTHROPLASTY Left 11/18/2018   Procedure: Left Anterior Hip Arthroplasty;  Surgeon: Sheril Coy, MD;  Location: [facility] ORS;  Service: Orthopedics;  Laterality: Left;   Patient Active Problem List   Diagnosis Date Noted   Lateral epicondylitis of right elbow 03/02/2022   Hyperlipidemia 12/30/2020   Alpha thalassaemia minor 05/25/2020   Healthcare maintenance 05/25/2020   Acute gout due to renal impairment involving toe 05/20/2019   Neuropathy 05/18/2019   Primary localized osteoarthritis of left hip 11/18/2018   Primary osteoarthritis of left hip 11/18/2018   Microcytic anemia 08/13/2018   Disc disease, degenerative, cervical 11/01/2017   Liver fibrosis 04/30/2017   NSAID long-term use 01/09/2016   Chronic back pain secondary to DJD of cervical, thoracic and lumbar spine and disc herniation 01/09/2016   Osteoarthritis of both knees 11/02/2011   DISORDER, DEPRESSIVE NEC 09/25/2006   Chronic hepatitis C without hepatic coma (HCC) 02/22/2006   Essential hypertension 02/22/2006    PCP: Celestia Rosaline SQUIBB, NP Ref Provider (PCP)   REFERRING PROVIDER: Celestia Rosaline SQUIBB, NP Ref Provider (PCP)   REFERRING DIAG:  W19.XXXD (ICD-10-CM) - Fall, subsequent encounter R07.89 (ICD-10-CM) - Rib pain on right side   THERAPY DIAG:  Muscle weakness (generalized)  Pain  in left hip  Abdominal wall pain in right lower quadrant  Rationale for Evaluation and Treatment: rehabilitation  ONSET DATE: October (wheelchair fall), November (fishing and tripped and fell on R side)  SUBJECTIVE:                                                                                                                                                                                      SUBJECTIVE STATEMENT: Pt reports he is doing well overall with some minor hip pain with increased activity.  PERTINENT HISTORY: Negative results for fracture from imaging.  Lumbar fusion x 2 ,  cervical fusion x 2 , left THA   PAIN:  10W Are you having pain?  Yes: NPRS scale: 2/10 hip, 0/10 LB,  Pain location: R rib pain, L hip, LB Pain description: dull ache Aggravating factors: movement Relieving factors: rest  PRECAUTIONS: None  RED FLAGS: None   WEIGHT BEARING RESTRICTIONS: No  FALLS:  Has patient fallen in last 6 months? Yes. Number of falls 2  OCCUPATION: N/a  PLOF: Independent  PATIENT GOALS: improve mobility, decrease pain   OBJECTIVE:  Note: Objective measures were completed at Evaluation unless otherwise noted.   PATIENT SURVEYS :  PSFS: THE PATIENT SPECIFIC FUNCTIONAL SCALE  Place score of 0-10 (0 = unable to perform activity and 10 = able to perform activity at the same level as before injury or problem)  Activity Date: 04/06/24    Improve my mobility 3    2. Overall increase my strength in my body 5    3. Increase my flexibility in my body 3    4.      Total Score 11      Total Score = Sum of activity scores/number of activities  Minimally Detectable Change: 3 points (for single activity); 2 points (for average score)  [employer] [facility] (nd). The Patient Specific Functional Scale . Retrieved from [email or web address]   COGNITION: Overall cognitive status: Within functional limits for tasks assessed     SENSATION: WFL  POSTURE: No Significant postural limitations, rounded shoulders, and forward head    BL UE wfl Lumbar rom WFL UE strength WFL   UPPER EXTREMITY ROM:   Active ROM Right eval Left eval  Shoulder flexion wfl wfl  Shoulder extension    Shoulder abduction    Shoulder adduction    Shoulder internal rotation    Shoulder external rotation    Elbow flexion    Elbow extension    Wrist flexion    Wrist extension    Wrist ulnar deviation    Wrist radial deviation    Wrist pronation    Wrist  supination    (Blank rows = not tested)   LOWER  EXTREMITY MMT: Hip flexion 4- BL Hip abduction 4- BL Hip adduction 4- BL  Knee extension 4+ BL Knee flexion 3+ BL  UPPER EXTREMITY MMT:  MMT Right eval Left eval  Shoulder flexion wfl wfl  Shoulder extension    Shoulder abduction    Shoulder adduction    Shoulder internal rotation    Shoulder external rotation    Middle trapezius    Lower trapezius    Elbow flexion    Elbow extension    Wrist flexion    Wrist extension    Wrist ulnar deviation    Wrist radial deviation    Wrist pronation    Wrist supination    Grip strength (lbs)    (Blank rows = not tested)     JOINT MOBILITY TESTING:  N/a  PALPATION:  TTP R oblique area, L hip joint                                                                                        TREATMENT DATE:   [facility] Adult PT Treatment:                                                DATE: 05/20/24 Aquatic therapy at [facility]- [address] - therapeutic pool temp approximately 91 degrees. Pt enters building ambulating independently. Treatment took place in water 3.8 to  4 ft 8 in. deep depending upon activity.  Pt entered and exited the pool via stair and handrails ambulating.   Aquatic Exercise: Walking forward/backwards/side stepping Step ups bottom step fwd and lat x10 BIL Lateral walking with green bells - shoulder abd/add x2 laps Marching walk with hip openers Suitcase carry fwd/bwd walking YDB x3 laps Standing with dumbbell support: Hip abd/add x20 BIL Squats x20 SL heel raises - 2x10 ea Hip ext/flex with knee straight x 20 BIL Hip Circles CW/CCW x10 each BIL  Pt requires the buoyancy of water for active assisted exercises with buoyancy supported for strengthening and AROM exercises. Hydrostatic pressure also supports joints by unweighting joint load by at least 50 % in 3-4 feet depth water. 80% in chest to neck deep water. Water will provide assistance with movement using the current and laminar flow while the buoyancy  reduces weight bearing. Pt requires the viscosity of the water for resistance with strengthening exercises.  [facility] Adult PT Treatment:                                                DATE: 05/15/24 Aquatic therapy at [facility]- [address] - therapeutic pool temp approximately 91 degrees. Pt enters building ambulating independently. Treatment took place in water 3.8 to  4 ft 8 in. deep depending upon activity.  Pt entered and exited the pool via stair and handrails ambulating.   Aquatic  Exercise: Walking forward/backwards/side stepping Step ups bottom step fwd x10 BIL Lateral walking with green bells - shoulder abd/add x2 laps Marching walk fwd/bwd with yellowDB push/pull x3 laps Suitcase carry fwd/bwd walking YDB x3 laps Standing with UE support edge of pool: Hip abd/add x20 BIL Squats x20 SL heel raises - 2x10 ea Hip ext/flex with knee straight x 20 BIL Hip Circles CW/CCW x10 each BIL  Pt requires the buoyancy of water for active assisted exercises with buoyancy supported for strengthening and AROM exercises. Hydrostatic pressure also supports joints by unweighting joint load by at least 50 % in 3-4 feet depth water. 80% in chest to neck deep water. Water will provide assistance with movement using the current and laminar flow while the buoyancy reduces weight bearing. Pt requires the viscosity of the water for resistance with strengthening exercises.  05/13/24 Therapeutic Exercise/Activity: HEP reassessment and update Seated LTR 2x8x3s Seated GTB clamshell x8x3s, blue TB 2x6x3s Seated hip march 2x8x3s Sit to stand 2x6 Standing calf raises 2x8x3s Standing marches with SUE support 2x8    [facility] Adult PT Treatment:                                                DATE: 05/08/24 Therapeutic Exercise: Nustep L5 UE/LE x 10 minutes  Supine March 10 x 2  Supine clam GTB 10 x 2  Banded bridge GTB 10 x 2  LTR x 10 Ball squeeze 3 sec  x 8     PATIENT EDUCATION: Education details:  HEP Person educated: Patient Education method: Programmer, Multimedia, Facilities Manager, and Handouts Education comprehension: verbalized understanding and returned demonstration  HOME EXERCISE PROGRAM: 5x/wk, 2x/day Seated trunk rotation x6x3s Seated side bend x6x3s   Access Code: [email or web address] URL: [email or web address] Date: 05/08/2024 Prepared by: Harlene Persons  Exercises - Supine Bridge with Resistance Band  - 1 x daily - 7 x weekly - 2 sets - 10 reps - Supine March with Resistance Band  - 1 x daily - 7 x weekly - 2 sets - 10 reps - Supine Lower Trunk Rotation  - 1 x daily - 7 x weekly - 1 sets - 10 reps - 5 hold - Supine Hip Adduction Isometric with Ball  - 1 x daily - 7 x weekly - 2 sets - 10 reps   ASSESSMENT:  CLINICAL IMPRESSION: Session today focused on hip and core in the aquatic environment for use of buoyancy to offload joints and the viscosity of water as resistance during therapeutic exercise.  Able to progress multiple exercises.  Reduced UE support to integrate balance to good effect.  Patient was able to tolerate all prescribed exercises in the aquatic environment with no adverse effects and reports no increase in pain at the end of the session. Patient continues to benefit from skilled PT services on land and aquatic based and should be progressed as able to improve functional independence.   EVAL: Patient is a 69 year old male who presents with R oblique and L hip pain after fall w/ suspected acute strain from trauma. Patient presents with deficits in: excessive pain and functional strength of BL LE. As a result, the patient would benefit from skilled PT to address aforementioned deficits via plan below.    OBJECTIVE IMPAIRMENTS: decreased strength and pain.   ACTIVITY LIMITATIONS: carrying, lifting, and squatting  PERSONAL FACTORS: Age, Time since onset  of injury/illness/exacerbation, and 3+ comorbidities:   are also affecting patient's functional outcome.    REHAB POTENTIAL: Fair    CLINICAL DECISION MAKING: Evolving/moderate complexity  EVALUATION COMPLEXITY: Moderate  GOALS: Goals reviewed with patient? No  SHORT TERM GOALS: Target date: 04/27/2024  1) Patient will demonstrate 75% HEP compliance to show independence with self-management of condition Baseline: 0% Goal status: MET   2) Patient will decrease worst pain to 8 at most to improve ADL completion and overall QOL Baseline: 10 05/08/24: 2/10 since hip injection Goal status: MET     LONG TERM GOALS: Target date: 06/07/24   1) Patient will demonstrate 100% HEP compliance to show independence with self-management of condition   Baseline: 0% Goal status: INITIAL  2) Patient will decrease worst pain to 6 at most to improve ADL completion and overall QOL   Baseline: 10 Goal status: INITIAL  3) Patient will demonstrate a 6 point improvement in PSFS to show improvements in ADL completion and overall QOL Baseline: 11 Goal status: INITIAL  4) Patient will demonstrate at least 4/5 MMT score of BL hips and LE to show improvements in muscle strength needed for ADL completion.    Baseline: see chart  Goal status: initial   PLAN: PT FREQUENCY: 1-2x/week  PT DURATION: 8 weeks  PLANNED INTERVENTIONS: [id number]-Therapeutic exercises, 97530- Therapeutic activity, [id number]- Neuromuscular re-education, 97535- Self Care, [id number]- Manual therapy, [phone]- Aquatic Therapy, Patient/Family education, Balance training, and Stair training  PLAN FOR NEXT SESSION: HEP assessment and progression, symptom modulation, and loading (isolated and/or functional). Manual therapy and NME training as needed. Continue with functional strengthening/loading of BL LE/hips.  Helene BRAVO Velia Pamer PT 05/20/24 1:32 PM    "

## 2024-05-20 NOTE — Telephone Encounter (Signed)
 No recent lipid last one 05/2023

## 2024-05-22 ENCOUNTER — Ambulatory Visit

## 2024-05-22 DIAGNOSIS — M5459 Other low back pain: Secondary | ICD-10-CM

## 2024-05-22 DIAGNOSIS — M6281 Muscle weakness (generalized): Secondary | ICD-10-CM

## 2024-05-22 DIAGNOSIS — M25552 Pain in left hip: Secondary | ICD-10-CM

## 2024-05-22 NOTE — Therapy (Signed)
 " OUTPATIENT PHYSICAL THERAPY TREATMENT   Patient Name: Greg Flowers MRN: [id number] DOB:1955/04/27, 69 y.o., male Today's Date: 05/22/2024  END OF SESSION:  PT End of Session - 05/22/24 1024     Visit Number 9    Number of Visits 16    Date for Recertification  06/07/24    PT Start Time 1020   pt late   PT Stop Time 1100    PT Time Calculation (min) 40 min    Activity Tolerance Patient tolerated treatment well    Behavior During Therapy [facility] for tasks assessed/performed           Past Medical History:  Diagnosis Date   Anxiety    Calculus, kidney 2000   Sees Dr. Alline, urology.    Cervical spondylosis 2004   sp surgery by Dr. Colon   Depression    GERD (gastroesophageal reflux disease)    Headache(784.0)    Chronic, on vicodin 5 TID for this.    Hepatitis C    Has occasional visits with Dr. Celestia [facility], failed RX in 2000.  Liver Biopsy 9/06: Minimally active hepatitis consistent with hepatitis C, minimal necroinflamtory activity grade 1, no incrased fibrosis stage 0.    Herpes labialis    HERPES LABIALIS 04/04/2006   Qualifier: Diagnosis of  By: Sharl MD, Jackquline     History of kidney stones    Hypertension    Pneumonia    Renal stone    Spondylolisthesis of lumbar region    Thalassemia    HGB 9/09 14.4 wtih MCV 72.6   Viral upper respiratory illness 04/12/2021   Wears glasses    Past Surgical History:  Procedure Laterality Date   ANTERIOR CERVICAL DECOMP/DISCECTOMY FUSION N/A 11/21/2017   Procedure: ANTERIOR CERVICAL DECOMPRESSION FUSION, CERVICAL THREE-FOUR, CERVICAL FOUR-FIVE WITH INSTRUMENTATION AND ALLOGRAFT;  Surgeon: Beuford Anes, MD;  Location: [facility] OR;  Service: Orthopedics;  Laterality: N/A;  ANTERIOR CERVICAL DECOMPRESSION FUSION, CERVICAL THREE-FOUR, CERVICAL FOUR-FIVE WITH INSTRUMENTATION AND ALLOGRAFT   BACK SURGERY  2017   L4-L5 PLATE/SCREWS   CERVICAL DISCECTOMY  2004   Dr Colon   COLONOSCOPY     LITHOTRIPSY  2004   Dr Janit   NASAL SEPTUM  SURGERY  2007   TONSILLECTOMY     TOTAL HIP ARTHROPLASTY Left 11/18/2018   Procedure: Left Anterior Hip Arthroplasty;  Surgeon: Sheril Coy, MD;  Location: [facility];  Service: Orthopedics;  Laterality: Left;   Patient Active Problem List   Diagnosis Date Noted   Lateral epicondylitis of right elbow 03/02/2022   Hyperlipidemia 12/30/2020   Alpha thalassaemia minor 05/25/2020   Healthcare maintenance 05/25/2020   Acute gout due to renal impairment involving toe 05/20/2019   Neuropathy 05/18/2019   Primary localized osteoarthritis of left hip 11/18/2018   Primary osteoarthritis of left hip 11/18/2018   Microcytic anemia 08/13/2018   Disc disease, degenerative, cervical 11/01/2017   Liver fibrosis 04/30/2017   NSAID long-term use 01/09/2016   Chronic back pain secondary to DJD of cervical, thoracic and lumbar spine and disc herniation 01/09/2016   Osteoarthritis of both knees 11/02/2011   DISORDER, DEPRESSIVE NEC 09/25/2006   Chronic hepatitis C without hepatic coma (HCC) 02/22/2006   Essential hypertension 02/22/2006    PCP: Celestia Rosaline SQUIBB, NP Ref Provider (PCP)   REFERRING PROVIDER: Celestia Rosaline SQUIBB, NP Ref Provider (PCP)   REFERRING DIAG:  W19.XXXD (ICD-10-CM) - Fall, subsequent encounter R07.89 (ICD-10-CM) - Rib pain on right side   THERAPY DIAG:  Muscle  weakness (generalized)  Pain in left hip  Other low back pain  Rationale for Evaluation and Treatment: rehabilitation  ONSET DATE: October (wheelchair fall), November (fishing and tripped and fell on R side)  SUBJECTIVE:                                                                                                                                                                                      SUBJECTIVE STATEMENT: Pt reports being able to do some HEP every other day. 3/10 LBP from being with grandkids. Hip not hurting at the moment.   PERTINENT HISTORY: Negative results for fracture from imaging.   Lumbar fusion x 2 , cervical fusion x 2 , left THA   PAIN:  10W Are you having pain?  Yes: NPRS scale: 2/10 hip, 0/10 LB,  Pain location: R rib pain, L hip, LB Pain description: dull ache Aggravating factors: movement Relieving factors: rest  PRECAUTIONS: None  RED FLAGS: None   WEIGHT BEARING RESTRICTIONS: No  FALLS:  Has patient fallen in last 6 months? Yes. Number of falls 2  OCCUPATION: N/a  PLOF: Independent  PATIENT GOALS: improve mobility, decrease pain   OBJECTIVE:  Note: Objective measures were completed at Evaluation unless otherwise noted.   PATIENT SURVEYS :  PSFS: THE PATIENT SPECIFIC FUNCTIONAL SCALE  Place score of 0-10 (0 = unable to perform activity and 10 = able to perform activity at the same level as before injury or problem)  Activity Date: 04/06/24    Improve my mobility 3    2. Overall increase my strength in my body 5    3. Increase my flexibility in my body 3    4.      Total Score 11      Total Score = Sum of activity scores/number of activities  Minimally Detectable Change: 3 points (for single activity); 2 points (for average score)  [facility] (nd). The Patient Specific Functional Scale . Retrieved from [email or web address]   COGNITION: Overall cognitive status: Within functional limits for tasks assessed     SENSATION: WFL  POSTURE: No Significant postural limitations, rounded shoulders, and forward head    BL UE wfl Lumbar rom WFL UE strength WFL   UPPER EXTREMITY ROM:   Active ROM Right eval Left eval  Shoulder flexion wfl wfl  Shoulder extension    Shoulder abduction    Shoulder adduction    Shoulder internal rotation    Shoulder external rotation    Elbow flexion    Elbow extension    Wrist flexion    Wrist extension    Wrist ulnar deviation    Wrist radial  deviation    Wrist pronation    Wrist supination    (Blank rows = not  tested)   LOWER EXTREMITY MMT: Hip flexion 4- BL Hip abduction 4- BL Hip adduction 4- BL  Knee extension 4+ BL Knee flexion 3+ BL  UPPER EXTREMITY MMT:  MMT Right eval Left eval  Shoulder flexion wfl wfl  Shoulder extension    Shoulder abduction    Shoulder adduction    Shoulder internal rotation    Shoulder external rotation    Middle trapezius    Lower trapezius    Elbow flexion    Elbow extension    Wrist flexion    Wrist extension    Wrist ulnar deviation    Wrist radial deviation    Wrist pronation    Wrist supination    Grip strength (lbs)    (Blank rows = not tested)     JOINT MOBILITY TESTING:  N/a  PALPATION:  TTP R oblique area, L hip joint                                                                                        TREATMENT DATE:  05/22/24 Neuromuscular Reeducation:  HEP reassessment and update Lower trunk rotation x8x3s, x10x3s Seated sidebend 2x12x3s Seated hip march x8x3s Seated black TB clamshell x6x3s, blue TB x15x3s Supine hip marches 2x8x3s  Hot pack x10 min (simultaneously with table exercises)   [facility] Adult PT Treatment:                                                DATE: 05/20/24 Aquatic therapy at [facility]- [address] - therapeutic pool temp approximately 91 degrees. Pt enters building ambulating independently. Treatment took place in water 3.8 to  4 ft 8 in. deep depending upon activity.  Pt entered and exited the pool via stair and handrails ambulating.   Aquatic Exercise: Walking forward/backwards/side stepping Step ups bottom step fwd and lat x10 BIL Lateral walking with green bells - shoulder abd/add x2 laps Marching walk with hip openers Suitcase carry fwd/bwd walking YDB x3 laps Standing with dumbbell support: Hip abd/add x20 BIL Squats x20 SL heel raises - 2x10 ea Hip ext/flex with knee straight x 20 BIL Hip Circles CW/CCW x10 each BIL  Pt requires the buoyancy of water for active assisted  exercises with buoyancy supported for strengthening and AROM exercises. Hydrostatic pressure also supports joints by unweighting joint load by at least 50 % in 3-4 feet depth water. 80% in chest to neck deep water. Water will provide assistance with movement using the current and laminar flow while the buoyancy reduces weight bearing. Pt requires the viscosity of the water for resistance with strengthening exercises.  [facility] Adult PT Treatment:  DATE: 05/15/24 Aquatic therapy at [facility]- [address] - therapeutic pool temp approximately 91 degrees. Pt enters building ambulating independently. Treatment took place in water 3.8 to  4 ft 8 in. deep depending upon activity.  Pt entered and exited the pool via stair and handrails ambulating.   Aquatic Exercise: Walking forward/backwards/side stepping Step ups bottom step fwd x10 BIL Lateral walking with green bells - shoulder abd/add x2 laps Marching walk fwd/bwd with yellowDB push/pull x3 laps Suitcase carry fwd/bwd walking YDB x3 laps Standing with UE support edge of pool: Hip abd/add x20 BIL Squats x20 SL heel raises - 2x10 ea Hip ext/flex with knee straight x 20 BIL Hip Circles CW/CCW x10 each BIL  Pt requires the buoyancy of water for active assisted exercises with buoyancy supported for strengthening and AROM exercises. Hydrostatic pressure also supports joints by unweighting joint load by at least 50 % in 3-4 feet depth water. 80% in chest to neck deep water. Water will provide assistance with movement using the current and laminar flow while the buoyancy reduces weight bearing. Pt requires the viscosity of the water for resistance with strengthening exercises.  05/13/24 Therapeutic Exercise/Activity: HEP reassessment and update Seated LTR 2x8x3s Seated GTB clamshell x8x3s, blue TB 2x6x3s Seated hip march 2x8x3s Sit to stand 2x6 Standing calf raises 2x8x3s Standing marches with SUE  support 2x8    [facility] Adult PT Treatment:                                                DATE: 05/08/24 Therapeutic Exercise: Nustep L5 UE/LE x 10 minutes  Supine March 10 x 2  Supine clam GTB 10 x 2  Banded bridge GTB 10 x 2  LTR x 10 Ball squeeze 3 sec  x 8     PATIENT EDUCATION: Education details: HEP Person educated: Patient Education method: Programmer, Multimedia, Facilities Manager, and Handouts Education comprehension: verbalized understanding and returned demonstration  HOME EXERCISE PROGRAM: 5x/wk, 2x/day Seated trunk rotation x6x3s Seated side bend x6x3s   Access Code: [email or web address] URL: [email or web address] Date: 05/08/2024 Prepared by: Harlene Persons  Exercises - Supine Bridge with Resistance Band  - 1 x daily - 7 x weekly - 2 sets - 10 reps - Supine March with Resistance Band  - 1 x daily - 7 x weekly - 2 sets - 10 reps - Supine Lower Trunk Rotation  - 1 x daily - 7 x weekly - 1 sets - 10 reps - 5 hold - Supine Hip Adduction Isometric with Ball  - 1 x daily - 7 x weekly - 2 sets - 10 reps   ASSESSMENT:  CLINICAL IMPRESSION: Patient tolerated treatment with no increases in pain with progressions in core and hip loading alongside sx modulation. Current deficits include: excessive pain, functional activity tolerance and strength. As a result, patient would continue to benefit from skilled PT to address said deficits via plan below.    EVAL: Patient is a 69 year old male who presents with R oblique and L hip pain after fall w/ suspected acute strain from trauma. Patient presents with deficits in: excessive pain and functional strength of BL LE. As a result, the patient would benefit from skilled PT to address aforementioned deficits via plan below.    OBJECTIVE IMPAIRMENTS: decreased strength and pain.   ACTIVITY LIMITATIONS: carrying, lifting, and squatting  PERSONAL FACTORS: Age,  Time since onset of injury/illness/exacerbation, and 3+ comorbidities:   are also  affecting patient's functional outcome.   REHAB POTENTIAL: Fair    CLINICAL DECISION MAKING: Evolving/moderate complexity  EVALUATION COMPLEXITY: Moderate  GOALS: Goals reviewed with patient? No  SHORT TERM GOALS: Target date: 04/27/2024  1) Patient will demonstrate 75% HEP compliance to show independence with self-management of condition Baseline: 0% Goal status: MET   2) Patient will decrease worst pain to 8 at most to improve ADL completion and overall QOL Baseline: 10 05/08/24: 2/10 since hip injection Goal status: MET     LONG TERM GOALS: Target date: 06/07/24   1) Patient will demonstrate 100% HEP compliance to show independence with self-management of condition   Baseline: 0% Goal status: INITIAL  2) Patient will decrease worst pain to 6 at most to improve ADL completion and overall QOL   Baseline: 10 Goal status: INITIAL  3) Patient will demonstrate a 6 point improvement in PSFS to show improvements in ADL completion and overall QOL Baseline: 11 Goal status: INITIAL  4) Patient will demonstrate at least 4/5 MMT score of BL hips and LE to show improvements in muscle strength needed for ADL completion.    Baseline: see chart  Goal status: initial   PLAN: PT FREQUENCY: 1-2x/week  PT DURATION: 8 weeks  PLANNED INTERVENTIONS: 97110-Therapeutic exercises, 97530- Therapeutic activity, [id number]- Neuromuscular re-education, 97535- Self Care, [id number]- Manual therapy, [phone]- Aquatic Therapy, Patient/Family education, Balance training, and Stair training  PLAN FOR NEXT SESSION: HEP assessment and progression, symptom modulation, and loading (isolated and/or functional). Manual therapy and NME training as needed. Continue with functional strengthening/loading of BL LE/hips.  Washington Greener Donyel Castagnola PT 05/22/24 10:52 AM    "

## 2024-05-27 ENCOUNTER — Ambulatory Visit: Admitting: Physical Therapy

## 2024-05-29 ENCOUNTER — Ambulatory Visit: Admitting: Physical Therapy

## 2024-06-03 ENCOUNTER — Ambulatory Visit: Admitting: Physical Therapy

## 2024-06-10 ENCOUNTER — Ambulatory Visit: Admitting: Physical Therapy

## 2024-06-12 ENCOUNTER — Ambulatory Visit
